# Patient Record
Sex: Female | Born: 1935 | Race: White | Hispanic: No | Marital: Married | Smoking: Never smoker
Health system: Southern US, Community
[De-identification: ages and names within clinical notes are randomized; demographics above are authoritative.]

## PROBLEM LIST (undated history)

## (undated) DIAGNOSIS — I10 Essential (primary) hypertension: Secondary | ICD-10-CM

## (undated) DIAGNOSIS — E039 Hypothyroidism, unspecified: Secondary | ICD-10-CM

## (undated) DIAGNOSIS — F329 Major depressive disorder, single episode, unspecified: Secondary | ICD-10-CM

## (undated) DIAGNOSIS — G4730 Sleep apnea, unspecified: Secondary | ICD-10-CM

## (undated) DIAGNOSIS — M1990 Unspecified osteoarthritis, unspecified site: Secondary | ICD-10-CM

## (undated) DIAGNOSIS — IMO0001 Reserved for inherently not codable concepts without codable children: Secondary | ICD-10-CM

## (undated) DIAGNOSIS — E669 Obesity, unspecified: Secondary | ICD-10-CM

## (undated) DIAGNOSIS — M8580 Other specified disorders of bone density and structure, unspecified site: Secondary | ICD-10-CM

## (undated) DIAGNOSIS — E7800 Pure hypercholesterolemia, unspecified: Secondary | ICD-10-CM

## (undated) DIAGNOSIS — F419 Anxiety disorder, unspecified: Secondary | ICD-10-CM

## (undated) DIAGNOSIS — F32A Depression, unspecified: Secondary | ICD-10-CM

## (undated) HISTORY — DX: Obesity, unspecified: E66.9

## (undated) HISTORY — DX: Essential (primary) hypertension: I10

## (undated) HISTORY — DX: Sleep apnea, unspecified: G47.30

## (undated) HISTORY — PX: ABDOMINAL HYSTERECTOMY: SHX81

## (undated) HISTORY — PX: BILATERAL OOPHORECTOMY: SHX1221

## (undated) HISTORY — DX: Reserved for inherently not codable concepts without codable children: IMO0001

## (undated) HISTORY — DX: Hypothyroidism, unspecified: E03.9

## (undated) HISTORY — PX: VESICOVAGINAL FISTULA CLOSURE W/ TAH: SUR271

## (undated) HISTORY — DX: Unspecified osteoarthritis, unspecified site: M19.90

## (undated) HISTORY — PX: THYROIDECTOMY, PARTIAL: SHX18

## (undated) HISTORY — DX: Pure hypercholesterolemia, unspecified: E78.00

---

## 1998-11-13 ENCOUNTER — Ambulatory Visit: Admission: RE | Admit: 1998-11-13 | Discharge: 1998-11-13 | Payer: Self-pay | Admitting: Internal Medicine

## 1999-02-03 ENCOUNTER — Ambulatory Visit (HOSPITAL_COMMUNITY): Admission: RE | Admit: 1999-02-03 | Discharge: 1999-02-03 | Payer: Self-pay | Admitting: Cardiology

## 1999-02-03 HISTORY — PX: CARDIAC CATHETERIZATION: SHX172

## 1999-03-26 ENCOUNTER — Ambulatory Visit (HOSPITAL_COMMUNITY): Admission: RE | Admit: 1999-03-26 | Discharge: 1999-03-26 | Payer: Self-pay | Admitting: Endocrinology

## 1999-04-10 ENCOUNTER — Ambulatory Visit (HOSPITAL_COMMUNITY): Admission: RE | Admit: 1999-04-10 | Discharge: 1999-04-10 | Payer: Self-pay | Admitting: *Deleted

## 1999-04-13 ENCOUNTER — Ambulatory Visit (HOSPITAL_COMMUNITY): Admission: RE | Admit: 1999-04-13 | Discharge: 1999-04-13 | Payer: Self-pay

## 1999-04-13 ENCOUNTER — Other Ambulatory Visit: Admission: RE | Admit: 1999-04-13 | Discharge: 1999-04-13 | Payer: Self-pay

## 1999-09-10 ENCOUNTER — Other Ambulatory Visit: Admission: RE | Admit: 1999-09-10 | Discharge: 1999-09-10 | Payer: Self-pay | Admitting: Obstetrics and Gynecology

## 2000-03-10 ENCOUNTER — Encounter: Admission: RE | Admit: 2000-03-10 | Discharge: 2000-03-10 | Payer: Self-pay | Admitting: Internal Medicine

## 2000-03-10 ENCOUNTER — Encounter: Payer: Self-pay | Admitting: Internal Medicine

## 2000-03-25 ENCOUNTER — Encounter: Admission: RE | Admit: 2000-03-25 | Discharge: 2000-03-25 | Payer: Self-pay | Admitting: Internal Medicine

## 2000-03-25 ENCOUNTER — Encounter: Payer: Self-pay | Admitting: Internal Medicine

## 2000-03-29 ENCOUNTER — Encounter (INDEPENDENT_AMBULATORY_CARE_PROVIDER_SITE_OTHER): Payer: Self-pay

## 2000-03-29 ENCOUNTER — Ambulatory Visit (HOSPITAL_COMMUNITY): Admission: RE | Admit: 2000-03-29 | Discharge: 2000-03-29 | Payer: Self-pay | Admitting: *Deleted

## 2000-04-04 ENCOUNTER — Encounter: Payer: Self-pay | Admitting: Endocrinology

## 2000-04-04 ENCOUNTER — Ambulatory Visit (HOSPITAL_COMMUNITY): Admission: RE | Admit: 2000-04-04 | Discharge: 2000-04-04 | Payer: Self-pay | Admitting: Endocrinology

## 2000-04-22 ENCOUNTER — Encounter: Admission: RE | Admit: 2000-04-22 | Discharge: 2000-04-22 | Payer: Self-pay | Admitting: *Deleted

## 2000-04-22 ENCOUNTER — Encounter: Payer: Self-pay | Admitting: *Deleted

## 2001-10-31 ENCOUNTER — Other Ambulatory Visit: Admission: RE | Admit: 2001-10-31 | Discharge: 2001-10-31 | Payer: Self-pay | Admitting: Obstetrics and Gynecology

## 2002-11-15 HISTORY — PX: ORIF ANKLE FRACTURE: SUR919

## 2003-07-17 ENCOUNTER — Encounter: Payer: Self-pay | Admitting: Emergency Medicine

## 2003-07-17 ENCOUNTER — Emergency Department (HOSPITAL_COMMUNITY): Admission: EM | Admit: 2003-07-17 | Discharge: 2003-07-17 | Payer: Self-pay | Admitting: Emergency Medicine

## 2003-07-20 ENCOUNTER — Encounter: Payer: Self-pay | Admitting: Orthopedic Surgery

## 2003-07-21 ENCOUNTER — Inpatient Hospital Stay (HOSPITAL_COMMUNITY): Admission: RE | Admit: 2003-07-21 | Discharge: 2003-07-25 | Payer: Self-pay | Admitting: Orthopedic Surgery

## 2003-11-25 ENCOUNTER — Ambulatory Visit (HOSPITAL_COMMUNITY): Admission: RE | Admit: 2003-11-25 | Discharge: 2003-11-25 | Payer: Self-pay | Admitting: Internal Medicine

## 2003-12-06 ENCOUNTER — Encounter (INDEPENDENT_AMBULATORY_CARE_PROVIDER_SITE_OTHER): Payer: Self-pay | Admitting: Specialist

## 2003-12-06 ENCOUNTER — Ambulatory Visit (HOSPITAL_COMMUNITY): Admission: RE | Admit: 2003-12-06 | Discharge: 2003-12-06 | Payer: Self-pay | Admitting: *Deleted

## 2004-08-18 ENCOUNTER — Ambulatory Visit (HOSPITAL_COMMUNITY): Admission: RE | Admit: 2004-08-18 | Discharge: 2004-08-18 | Payer: Self-pay | Admitting: Internal Medicine

## 2004-08-21 HISTORY — PX: US ECHOCARDIOGRAPHY: HXRAD669

## 2004-11-06 ENCOUNTER — Ambulatory Visit (HOSPITAL_COMMUNITY): Admission: RE | Admit: 2004-11-06 | Discharge: 2004-11-06 | Payer: Self-pay | Admitting: Endocrinology

## 2004-12-05 ENCOUNTER — Emergency Department (HOSPITAL_COMMUNITY): Admission: EM | Admit: 2004-12-05 | Discharge: 2004-12-05 | Payer: Self-pay | Admitting: Emergency Medicine

## 2005-01-05 ENCOUNTER — Encounter: Admission: RE | Admit: 2005-01-05 | Discharge: 2005-04-05 | Payer: Self-pay | Admitting: Internal Medicine

## 2005-01-13 ENCOUNTER — Encounter: Admission: RE | Admit: 2005-01-13 | Discharge: 2005-01-13 | Payer: Self-pay | Admitting: Internal Medicine

## 2005-01-25 ENCOUNTER — Encounter (INDEPENDENT_AMBULATORY_CARE_PROVIDER_SITE_OTHER): Payer: Self-pay | Admitting: Specialist

## 2005-01-25 ENCOUNTER — Ambulatory Visit (HOSPITAL_COMMUNITY): Admission: RE | Admit: 2005-01-25 | Discharge: 2005-01-25 | Payer: Self-pay | Admitting: Surgery

## 2005-02-02 ENCOUNTER — Encounter: Admission: RE | Admit: 2005-02-02 | Discharge: 2005-04-13 | Payer: Self-pay | Admitting: Orthopedic Surgery

## 2005-05-21 ENCOUNTER — Encounter: Admission: RE | Admit: 2005-05-21 | Discharge: 2005-05-21 | Payer: Self-pay | Admitting: Internal Medicine

## 2005-06-03 ENCOUNTER — Encounter: Admission: RE | Admit: 2005-06-03 | Discharge: 2005-06-03 | Payer: Self-pay | Admitting: Internal Medicine

## 2005-07-26 ENCOUNTER — Encounter: Admission: RE | Admit: 2005-07-26 | Discharge: 2005-08-25 | Payer: Self-pay | Admitting: Internal Medicine

## 2005-08-26 ENCOUNTER — Encounter: Admission: RE | Admit: 2005-08-26 | Discharge: 2005-11-24 | Payer: Self-pay | Admitting: Internal Medicine

## 2005-10-15 ENCOUNTER — Encounter: Admission: RE | Admit: 2005-10-15 | Discharge: 2005-10-15 | Payer: Self-pay | Admitting: Internal Medicine

## 2005-11-29 ENCOUNTER — Encounter: Admission: RE | Admit: 2005-11-29 | Discharge: 2005-11-29 | Payer: Self-pay | Admitting: General Surgery

## 2005-11-29 ENCOUNTER — Encounter (INDEPENDENT_AMBULATORY_CARE_PROVIDER_SITE_OTHER): Payer: Self-pay | Admitting: *Deleted

## 2005-12-15 ENCOUNTER — Ambulatory Visit (HOSPITAL_COMMUNITY): Admission: RE | Admit: 2005-12-15 | Discharge: 2005-12-15 | Payer: Self-pay | Admitting: *Deleted

## 2005-12-15 ENCOUNTER — Encounter (INDEPENDENT_AMBULATORY_CARE_PROVIDER_SITE_OTHER): Payer: Self-pay | Admitting: Specialist

## 2006-02-15 ENCOUNTER — Encounter: Admission: RE | Admit: 2006-02-15 | Discharge: 2006-02-15 | Payer: Self-pay | Admitting: Internal Medicine

## 2006-07-19 ENCOUNTER — Encounter: Admission: RE | Admit: 2006-07-19 | Discharge: 2006-07-19 | Payer: Self-pay | Admitting: Internal Medicine

## 2006-09-06 ENCOUNTER — Encounter: Admission: RE | Admit: 2006-09-06 | Discharge: 2006-09-06 | Payer: Self-pay | Admitting: Internal Medicine

## 2006-09-08 ENCOUNTER — Encounter: Admission: RE | Admit: 2006-09-08 | Discharge: 2006-09-08 | Payer: Self-pay | Admitting: Internal Medicine

## 2007-02-08 ENCOUNTER — Encounter: Admission: RE | Admit: 2007-02-08 | Discharge: 2007-02-08 | Payer: Self-pay | Admitting: Internal Medicine

## 2008-07-19 ENCOUNTER — Encounter: Admission: RE | Admit: 2008-07-19 | Discharge: 2008-07-19 | Payer: Self-pay | Admitting: Internal Medicine

## 2008-08-21 ENCOUNTER — Ambulatory Visit (HOSPITAL_COMMUNITY): Admission: RE | Admit: 2008-08-21 | Discharge: 2008-08-21 | Payer: Self-pay | Admitting: *Deleted

## 2008-08-21 ENCOUNTER — Encounter (INDEPENDENT_AMBULATORY_CARE_PROVIDER_SITE_OTHER): Payer: Self-pay | Admitting: *Deleted

## 2009-09-04 ENCOUNTER — Encounter: Admission: RE | Admit: 2009-09-04 | Discharge: 2009-09-04 | Payer: Self-pay | Admitting: Internal Medicine

## 2010-11-10 ENCOUNTER — Encounter
Admission: RE | Admit: 2010-11-10 | Discharge: 2010-11-10 | Payer: Self-pay | Source: Home / Self Care | Attending: Endocrinology | Admitting: Endocrinology

## 2010-11-15 DIAGNOSIS — IMO0001 Reserved for inherently not codable concepts without codable children: Secondary | ICD-10-CM

## 2010-11-15 HISTORY — DX: Reserved for inherently not codable concepts without codable children: IMO0001

## 2010-11-23 ENCOUNTER — Ambulatory Visit: Payer: Self-pay | Admitting: Cardiology

## 2010-11-27 ENCOUNTER — Telehealth (INDEPENDENT_AMBULATORY_CARE_PROVIDER_SITE_OTHER): Payer: Self-pay | Admitting: *Deleted

## 2010-11-30 ENCOUNTER — Encounter: Payer: Self-pay | Admitting: Internal Medicine

## 2010-11-30 ENCOUNTER — Ambulatory Visit: Admission: RE | Admit: 2010-11-30 | Discharge: 2010-11-30 | Payer: Self-pay | Source: Home / Self Care

## 2010-11-30 ENCOUNTER — Encounter (HOSPITAL_COMMUNITY)
Admission: RE | Admit: 2010-11-30 | Discharge: 2010-12-15 | Payer: Self-pay | Source: Home / Self Care | Attending: Cardiology | Admitting: Cardiology

## 2010-11-30 HISTORY — PX: CARDIOVASCULAR STRESS TEST: SHX262

## 2010-12-06 ENCOUNTER — Encounter: Payer: Self-pay | Admitting: Surgery

## 2010-12-17 NOTE — Assessment & Plan Note (Addendum)
Summary: Cardiology Nuclear Testing  Nuclear Med Background Indications for Stress Test: Evaluation for Ischemia   History: Myocardial Perfusion Study  History Comments: '08 MPS: EF= 77%  Symptoms: Chest Tightness, DOE, Fatigue, Fatigue with Exertion  Symptoms Comments: Last episode of chest pressure 2days ago   Nuclear Pre-Procedure Cardiac Risk Factors: Family History - CAD, Hypertension, Lipids, Obesity Caffeine/Decaff Intake: None NPO After: 6:30 PM IV 0.9% NS with Angio Cath: 22g     IV Site: R Hand IV Started by: Irean Hong, RN Chest Size (in) 38     Cup Size D     Height (in): 64 Weight (lb): 216 BMI: 37.21  Nuclear Med Study 1 or 2 day study:  1 day     Stress Test Type:  Lexiscan Reading MD:  Arvilla Meres, MD     Referring MD:  Vonna Drafts Resting Radionuclide:  Technetium 84m Tetrofosmin     Resting Radionuclide Dose:  11 mCi  Stress Radionuclide:  Technetium 63m Tetrofosmin     Stress Radionuclide Dose:  33 mCi   Stress Protocol      Max HR:  98 bpm     Predicted Max HR:  148 bpm  Max Systolic BP: 150 mm Hg     Percent Max HR:  66.22 %Rate Pressure Product:  16109  Lexiscan: 0.4 mg   Stress Test Technologist:  Cathlyn Parsons, RN     Nuclear Technologist:  Domenic Polite, CNMT  Rest Procedure  Myocardial perfusion imaging was performed at rest 45 minutes following the intravenous administration of Technetium 68m Tetrofosmin.  Stress Procedure  The patient received IV Lexiscan 0.4 mg over 15-seconds.  Technetium 1m Tetrofosmin injected at 30-seconds.  There were no significant changes with infusion. Patient had chest heaviness 6/10,SOB during infusion and resolved in recovery.  No ectopy noted.   Quantitative spect images were obtained after a 45 minute delay.  QPS Raw Data Images:  Normal; no motion artifact; normal heart/lung ratio. Stress Images:  Normal homogeneous uptake in all areas of the myocardium. Rest Images:  Normal homogeneous  uptake in all areas of the myocardium. Subtraction (SDS):  Normal Transient Ischemic Dilatation:  0.85  (Normal <1.22)  Lung/Heart Ratio:  0.33  (Normal <0.45)  Quantitative Gated Spect Images QGS EDV:  56 ml QGS ESV:  11 ml QGS EF:  81 %  Findings Normal nuclear study      Overall Impression  Exercise Capacity: Lexiscan with no exercise. ECG Impression: No significant ST segment change with Lexiscan. Overall Impression: Normal stress nuclear study.

## 2010-12-17 NOTE — Progress Notes (Signed)
Summary: Nuclear Pre-Procedure  Phone Note Outgoing Call Call back at Home Phone [phone]   Call placed by: Stanton Kidney, EMT-P,  November 27, 2010 3:37 PM Action Taken: Phone Call Completed Summary of Call: Left message with information on Myov[employer] Information Sheet (see scanned document for details). Stanton Kidney, EMT-P  November 27, 2010 3:37 PM     Nuclear Med Background Indications for Stress Test: Evaluation for Ischemia   History: Myocardial Perfusion Study  History Comments: '08 MPS: EF= 77%  Symptoms: Chest Pain, Chest Pressure, Chest Tightness, Fatigue, Fatigue with Exertion    Nuclear Pre-Procedure Cardiac Risk Factors: Family History - CAD, Hypertension, Lipids, Obesity

## 2010-12-18 ENCOUNTER — Other Ambulatory Visit: Payer: Self-pay | Admitting: Gastroenterology

## 2011-03-30 NOTE — Op Note (Signed)
NAMEJENNIFIER, SMITHERMAN NO.:  [id number]   MEDICAL RECORD NO.:  [id number]          PATIENT TYPE:  AMB   LOCATION:  ENDO                         FACILITY:  [facility]   PHYSICIAN:  Georgiana Spinner, M.D.    DATE OF BIRTH:  [birth year]   DATE OF PROCEDURE:  08/21/2008  DATE OF DISCHARGE:                               OPERATIVE REPORT   PROCEDURE:  Colonoscopy.   INDICATIONS:  Abdominal bloating.  Colon polyps.   ANESTHESIA:  Fentanyl 100 mcg, Versed 10 mg.   PROCEDURE IN DETAIL:  With the patient mildly sedated in the left  lateral decubitus position the Pentax videoscopic colonoscope was  inserted in the rectum and passed under direct vision to the cecum  identified by the ileocecal valve and base of cecum both of which were  photographed.  The prep was poor in that there were multiple areas of  yellowish somewhat thickened exudative material with some particles in  it that made it difficult to suction so small lesions could be missed  but from this point the colonoscope was slowly withdrawn taking  circumferential views of colonic mucosa stopping in the descending colon  where polyps were seen, photographed and removed using biopsy forceps  technique.  Subsequently we found a larger polyp in the descending colon  that I removed using snare cautery technique with cautery applied.  Tissue was retrieved by suctioning through the endoscope into a tissue  trap.  We next stopped in the sigmoid colon where a larger polyp was  seen, photographed and removed.  It was on a stalk and this was removed  using snare cautery technique as well.  Both with a setting of 20/150  blended current.  We then withdrew all the way to the rectum which  appeared normal on direct and showed hemorrhoids on retroflexed view.  The endoscope was straightened and withdrawn.  The patient's vital  signs, pulse oximeter remained stable.  The patient tolerated procedure  well without apparent  complications.   FINDINGS:  Internal hemorrhoids, diverticulosis of the sigmoid colon but  diverticulosis seen throughout the colon and polyps were seen in  descending and sigmoid colon, all removed.  Await biopsy report.  The  patient will call me for results and follow up with me as an outpatient.           ______________________________  Georgiana Spinner, M.D.     GMO/MEDQ  D:  08/21/2008  T:  08/21/2008  Job:  [id number]

## 2011-04-02 NOTE — Op Note (Signed)
NAME:  Adrienne [name], Adrienne [name]                        ACCOUNT NO.:  [id number]   MEDICAL RECORD NO.:  [id number]                   PATIENT TYPE:  OBV   LOCATION:  0473                                 FACILITY:  [facility]   PHYSICIAN:  Almedia Balls. Ranell Patrick, M.D.              DATE OF BIRTH:  1935/12/24   DATE OF PROCEDURE:  07/20/2003  DATE OF DISCHARGE:                                 OPERATIVE REPORT   PREOPERATIVE DIAGNOSIS:  Left ankle fracture, distal  fibula.   POSTOPERATIVE DIAGNOSIS:  Left ankle fracture, distal  fibula.   PROCEDURE:  Open reduction and internal fixation of left ankle fracture.   SURGEON:  Almedia Balls. Ranell Patrick, M.D.   FIRST ASSISTANT:  Roma Schanz, P.A.-C.   ANESTHESIA:  General endotracheal anesthesia.   ESTIMATED BLOOD LOSS:  Minimal.   TOURNIQUET TIME:  34 minutes.   COMPLICATIONS:  None. Instrument count was correct.   INDICATIONS FOR PROCEDURE:  The patient is a 75 year old female  who  sustained a twisting injury to the left ankle. She presented with a  displaced lateral malleolus fracture and incongruous medial jointline. The  patient had lateral subluxation of the talus under the  tibia. Based on this  unstable fracture, it was recommended to proceed with surgical reduction and  stabilization of her fractures. Informed consent was obtained.   DESCRIPTION OF PROCEDURE:  After an adequate level of anesthesia was  achieved the patient was positioned supine on the operating table. A  nonsterile tourniquet was placed on the proximal left calf. The left leg was  prepped and draped in the usual sterile fashion. After exsanguination of the  limb using and Esmarch bandage, the tourniquet was elevated to 300 mmHg.   A longitudinal skin incision was created over the distal  fibula. This was  taken sharply down until the fracture site was identified. The soft tissue  was cleared from the fracture site. The fracture was reduced and held in  place with a tenaculum  and clamp. An anterior-posterior lag screw was placed  followed by a posterior-anterior flat plate with 2 proximal fully threaded  cortical screws. No distal screws were placed.   The wound was thoroughly irrigated. The fracture was checked both on AP and  lateral views with C-arm to ensure adequate reduction. Excellent stability  was noted. At this point the wound was closed using a combination of  2-0  Vicryl and staples. A sterile dressing was applied followed  by a short leg  splint. The patient was taken to the recovery room in stable condition.                                               Almedia Balls. Ranell Patrick, M.D.    SRN/MEDQ  D:  07/21/2003  T:  07/21/2003  Job:  [id number]

## 2011-04-02 NOTE — Op Note (Signed)
[name], [name] NO.:  [id number]   MEDICAL RECORD NO.:  [id number]          PATIENT TYPE:  AMB   LOCATION:  ENDO                         [facility]ACILITY:  [facility]   PHYSICIAN:  Georgiana Spinner, M.D.    DATE OF BIRTH:  1936/01/01   DATE OF PROCEDURE:  12/15/2005  DATE OF DISCHARGE:                                 OPERATIVE REPORT   PROCEDURE:  Colonoscopy with polypectomy and biopsy.   INDICATIONS:  Colon polyps.   ANESTHESIA:   DESCRIPTION OF PROCEDURE:  With the patient mildly sedated in the left  lateral decubitus position, the Olympus videoscopic colonoscope was inserted  in the rectum, passed under direct vision through a diverticular filled  sigmoid colon with pressure applied we were able to reach the cecum  identified by ileocecal valve and appendiceal orifice both of which were  photographed. From this point, the colonoscope was slowly withdrawn, taking  circumferential views of the colonic mucosa stopping to photograph and  remove polyps first in the right colon which was removed using snare cautery  technique and a setting of 20/200 blended current.   We next stopped in the descending colon where there were three polyps very  closely spaced; two were removed using hot biopsy forceps technique. One was  removed, and it was somewhat of a larger polyp, and it was removed using  snare cautery technique. Again, with the same setting of 20/200 blended  current. With the latter we had to suction it to the end of the endoscope  and withdraw it. We, therefore, reinserted the endoscope to this level; and  found another polyp, at this point, and it too was removed, at this time  again, using hot biopsy forceps technique with the same setting.  We then  withdrew all the way to the rectum which appeared normal except for two  probably hypoplastic polyps. I elected just to eradicate with the hot biopsy  forceps tip; and in retroflex view hemorrhoids were noted. The  endoscope was  straightened and withdrawn. The patient's vital signs, and pulse oximeter  remained stable. The patient tolerated the procedure well without apparent  complication.   FINDINGS:  1.  Right-sided polyp.  2.  Polyps in the distal transverse and descending colon.  3.  Polyps in the rectum.  4.  Diverticulosis, mostly in the sigmoid colon, moderately severe.   PLAN:  Await biopsy report. The patient will call me for results and  followup with me as an outpatient.           ______________________________  Georgiana Spinner, M.D.     GMO/MEDQ  D:  12/15/2005  T:  12/15/2005  Job:  [id number]

## 2011-04-02 NOTE — Op Note (Signed)
NAME:  Adrienne [name], Adrienne [name]                        ACCOUNT NO.:  [id number]   MEDICAL RECORD NO.:  [id number]                   PATIENT TYPE:  AMB   LOCATION:  ENDO                                 FACILITY:  [facility]   PHYSICIAN:  Georgiana Spinner, M.D.                 DATE OF BIRTH:  [birth year]   DATE OF PROCEDURE:  12/06/2003  DATE OF DISCHARGE:                                 OPERATIVE REPORT   PROCEDURE:  Upper endoscopy with biopsy.   INDICATIONS:  Abdominal discomfort.  Hemoccult positivity.   ANESTHESIA:  1. Demerol 50 mg.  2. Versed 4 mg.   DESCRIPTION OF PROCEDURE:  With patient mildly sedated in the left lateral  decubitus position, the Olympus videoscopic endoscope was inserted in the  mouth, passed under direct vision through the esophagus, which appeared  normal, into the stomach.  Fundus, body, antrum, duodenal bulb, and second  portion of duodenum were visualized.  From this point, the endoscope was  slowly withdrawn, taking circumferential views of duodenal mucosa until the  endoscope pulled back into the stomach, placed in retroflexion to view the  stomach from below.  The endoscope was straightened, withdrawn, taking  circumferential views of the remaining gastric and esophageal mucosa,  stopping in the body and the fundus of the stomach were polyps were seen,  photographed, and biopsied.  The patient's vital signs and pulse oximeter  remained stable.  The patient tolerated the procedure well without apparent  complications.   FINDING:  1. Polyps with body and fundus of stomach, biopsied.  2. Await biopsy report.  3. The patient will call me for results and follow up with me as an     outpatient.  4. Proceed to colonoscopy as planned.                                               Georgiana Spinner, M.D.    GMO/MEDQ  D:  12/06/2003  T:  12/06/2003  Job:  [id number]

## 2011-04-02 NOTE — Discharge Summary (Signed)
NAME:  Adrienne [name], Adrienne [name]                        ACCOUNT NO.:  [id number]   MEDICAL RECORD NO.:  [id number]                   PATIENT TYPE:  INP   LOCATION:  0473                                 FACILITY:  [facility]   PHYSICIAN:  Almedia Balls. Ranell Patrick, M.D.              DATE OF BIRTH:  09/15/1936   DATE OF ADMISSION:  07/20/2003  DATE OF DISCHARGE:  07/24/2003                                 DISCHARGE SUMMARY   ADMITTING DIAGNOSES:  1. Left ankle fracture of distal radius with syndesmosis disruption.  2. Hypothyroidism.  3. Hypertension.   DISCHARGE DIAGNOSES:  1. Left ankle fracture of distal radius with syndesmosis disruption.  2. Hypothyroidism.  3. Hypertension.   PROCEDURE:  On July 20, 2003, the patient underwent open reduction and  internal fixation of left ankle fracture; Roma Schanz, P.A., assisted.   CONSULTS:  None.   DISCUSSION:  This 75 year old lady sustained a twisting injury to her left  ankle.  She had an interesting set of circumstances leading up to present  illness.  She suffered a syncopal episode at work this Wednesday before  admission.  She had a stressful day.  She skipped her lunch in order to  see a dentist.  In the afternoon of the same day, she had a syncopal episode  and fell out of her chair, injuring her left ankle.  She was eventually seen  in the emergency room, where x-rays revealed an oblique fracture of the  distal fibular and disruption of the ankle mortise.  She was seen in our  office and it was decided that due to that this was an unstable fracture,  that surgery would be indicated and she was scheduled for the above  procedure.   COURSE IN THE HOSPITAL:  This patient remained at a high anxiety level, very  fearful in getting about.  She was nonweightbearing to the left lower  extremity.  She had poor upper body strength as well.  She lives at home  with her 54 year old husband and he certainly was unable to tend for her.  After much  discussion including a consult from [facility], it was  felt that the patient needed further care before she went home.  Due to the  probable length of rehabilitation, it was felt that skilled nursing facility  would be indicated and arrangements were made for that transfer.   The patient was placed in a cast/splint to the left lower extremity below  the knee.  We encouraged movement of the toes; sensation remained intact.  Overall, her medical conditions remained stable while in the hospital and we  continued with her home medications.   We decided to place her on 81 mg of aspirin to assist in prevention of  thrombus, as well as TED hose and ankle pumps to the right lower extremity.  On the day of discharge, she was looking forward to her rehabilitation and  skilled nursing.   LABORATORY AND ACCESSORY CLINICAL DATA:  Laboratory values in the hospital  showed a hemoglobin of 15.0; hematocrit was 42.4.  A urinalysis on July 19, 2003 showed moderate leukocyte esterase; when repeated after  perioperative antibiotics, it only showed a trace.  We did not treat the  urinalysis.   Chest x-ray showed heart being enlarged, vascular congestion, mild bibasilar  atelectasis without effusion, done on July 17, 2003.   Electrocardiogram shows normal sinus rhythm on July 17, 2003.   CONDITION ON DISCHARGE:  Improved, stable.   PLAN:  1. Nonweightbearing to the left lower extremity, use walker to maintain     this.  She may rest the left foot when ambulating; this will be less than     touch-down weightbearing, left lower extremity.  2. She is to continue with her home medications which are:     a. Hyzaar 100/25 mg one-half tab daily.     b. Synthroid/Levothroid 150 mcg daily.     c. Aspirin 81 mg daily.     d. Hydrocodone for discomfort.     e. Robaxin 500 mg p.o. q.6h. p.r.n. muscle spasm.     f. Tylox every four hours as needed (q.4 h. p.r.n.) -- severe pain.  3. We  would like to see her back in the office with Dr. Almedia Balls. Norris     approximately two weeks after the date of surgery; two and a half weeks     would be okay as well.  4. She is to keep the left lower extremity elevated on pillows, use ice to     the left ankle on a p.r.n. basis.  5. We would recommend that any medical questions concerning her current     medications or change in her medical status be directed towards Dr.     Soyla Murphy. Renne Crigler, her family physician.       Dooley L. Cherlynn June.                 Almedia Balls. Ranell Patrick, M.D.    DLU/MEDQ  D:  07/24/2003  T:  07/24/2003  Job:  [id number]   cc:   Soyla Murphy. Renne Crigler, M.D.  [address]  [place]  [place] [address]  Fax: [phone]

## 2011-04-02 NOTE — Op Note (Signed)
NAME:  [name], Adrienne [name]                        ACCOUNT NO.:  [id number]   MEDICAL RECORD NO.:  [id number]                   PATIENT TYPE:  AMB   LOCATION:  ENDO                                 FACILITY:  [facility]   PHYSICIAN:  Georgiana Spinner, M.D.                 DATE OF BIRTH:  [birth year]   DATE OF PROCEDURE:  12/06/2003  DATE OF DISCHARGE:                                 OPERATIVE REPORT   PROCEDURE:  Colonoscopy.   INDICATIONS:  Colon polyps.  Hemoccult positivity.   ANESTHESIA:  1. Demerol 25 mg.  2. Versed 6 mg.   DESCRIPTION OF PROCEDURE:  With patient mildly sedated in the left lateral  decubitus position, the Olympus videoscopic colonoscope was inserted in the  rectum, passed through a diverticula-filled sigmoid colon to reach the  cecum, identified by ileocecal valve and appendiceal orifice, both of which  were photographed.  From this point, the colonoscope was slowly withdrawn,  taking circumferential views of the colonic mucosa, stopping first in the  ascending colon where a small polyp in a larger fold which I could define  whether this was truly fold or polyp were photographed and biopsied using  hot biopsy forceps technique, setting of 3/3 blended current.  Tissue was  retrieved for pathology.  The endoscope was then withdrawn, taking  circumferential views of the remaining colonic mucosa, stopping in the  descending colon where another polyp was seen, photographed, and removed,  using snare cautery technique, again a setting of 3/3 blended current.  We  withdrew all the way to the rectum which appeared normal on direct and  showed hemorrhoids on retroflexed view.  The endoscope was straightened,  withdrawn.  The patient's vital signs and pulse oximeter remained stable.  The patient tolerated the procedure well without apparent complications.   FINDINGS:  Diverticulosis throughout the colon, right about the same as left  and moderately severe.  Polyps of ascending  colon and descending colon, as  described, removed.  Internal hemorrhoids.  Await biopsy report.  The patient will call me for results and follow up  with me as an outpatient.                                               Georgiana Spinner, M.D.    GMO/MEDQ  D:  12/06/2003  T:  12/06/2003  Job:  [id number]

## 2011-04-02 NOTE — Procedures (Signed)
[facility]  Patient:    Adrienne [name], VANCLEVE                     MRN: [id number] Adm. Date:  [id number] Attending:  Sabino Gasser                           Procedure Report  PROCEDURE:  Upper endoscopy with biopsy.  ENDOSCOPIST:  Sabino Gasser, M.D.  INDICATION:  Abdominal pain.  ANESTHESIA:  Demerol 90 mg and Versed 9 mg were given intravenously in divided dose.  DESCRIPTION OF PROCEDURE:  With the patient mildly sedated in the left lateral decubitus position, the Olympus videoscopic endoscope was inserted in the mouth and passed under direct vision through the esophagus.  There was a question of Barretts esophagus, photographed and biopsied.  We entered into the stomach and passed the endoscope to the antrum, duodenal bulb and second portion of duodenum, all of which were well-visualized and photographed. There were polyps seen in the duodenal bulb and these were biopsied.  The endoscope was then withdrawn back into the stomach and placed in retroflexion to view the stomach from below and this appeared essentially normal and was photographed.  The endoscope was straightened and pulled back from distal-to-proximal stomach, taking circumferential views of the entire gastric mucosa, stopping to photograph polyps that were seen along the way and biopsying in the stomach and fundus.  Both were under a centimeter in size, certainly.  The endoscope was then withdrawn, taking circumferential views of the remaining gastric and esophageal mucosa which otherwise appeared normal. Patients vital signs and pulse oximetry remained stable.  Patient tolerated the procedure well without apparent complications.  FINDINGS:  Question of Barretts esophagus, biopsied, polyps of gastric body and fundus, biopsied, and biopsy of duodenal bulb polyps.  Await biopsy report.  Patient will call me for results and follow up with me as an outpatient. DD:  03/29/00 TD:  03/30/00 Job:  [id number]

## 2011-04-02 NOTE — H&P (Signed)
NAME:  Adrienne [name], Adrienne [name]                        ACCOUNT NO.:  [id number]   MEDICAL RECORD NO.:  [id number]                   PATIENT TYPE:  AMB   LOCATION:  DAY                                  FACILITY:  [facility]   PHYSICIAN:  Almedia Balls. Ranell Patrick, M.D.              DATE OF BIRTH:  05/23/1936   DATE OF ADMISSION:  07/19/2003  DATE OF DISCHARGE:                                HISTORY & PHYSICAL   CHIEF COMPLAINT:  Pain in my left ankle.   HISTORY OF PRESENT ILLNESS:  A 75 year old white female who suffered a  syncopal episode at work this past Wednesday.  Apparently, she had a  stressful day.  She skipped lunch in order to see a dentist.  In the  afternoon while she was at her chair at work she suffered this syncopal  episode and apparently fell out of her chair injuring her left ankle.  She  was taken to [facility] Emergency Room where x-rays revealed an oblique  fracture of the distal fibula with disruption of the ankle mortis.  She  followed up today with Ronnell Guadalajara, M.D.  Philips Montez Morita, M.D. has  discussed this case with Almedia Balls. Norris, M.D. and we planned open  reduction internal fixation Saturday morning.   PAST MEDICAL HISTORY:  1. This lady has been in relatively good health.  Is currently under the     care of Soyla Murphy. Renne Crigler, M.D.  2. Thyroidectomy.  3. Hysterectomy.  4. Bunionectomy.  5. Hypertension.  6. Hypothyroidism.   ALLERGIES:  SULFA and IBUPROFEN which causes rash and swelling of the face.   CURRENT MEDICATIONS:  1. Hyzaar 100/25 one-half tablet daily.  2. Synthroid either 150 mcg or 115 mcg one daily.  This will be confirmed     with the family.   FAMILY HISTORY:  Essentially negative.   SOCIAL HISTORY:  The patient has no intake of tobacco or alcohol products.  Is married.  Has a daughter as a support person.   REVIEW OF SYSTEMS:  CNS:  No seizures, shoulder paralysis, numbness, double  vision.  RESPIRATORY:  No productive cough.  No hemoptysis.   No shortness of  breath.  CARDIOVASCULAR:  No chest pain.  No angina.  No orthopnea.  GASTROINTESTINAL:  No nausea, vomiting, melena, or bloody stool.  GENITOURINARY:  No discharge, dysuria, hematuria.  MUSCULOSKELETAL:  Primarily in present illness left ankle.   PHYSICAL EXAMINATION:  GENERAL:  Alert, cooperative, friendly 75 year old  white female seen seated in wheelchair.  She is accompanied by her husband  and her daughter.  VITAL SIGNS:  Blood pressure 110/70, pulse 80 and regular, respirations 12,  unlabored.  HEENT:  Normocephalic.  Wears glasses.  PERRLA.  EOM intact.  Oropharynx is  clear.  CHEST:  Clear to auscultation.  No rhonchi.  No rales.  HEART:  Regular rate and rhythm.  No murmurs heard.  ABDOMEN:  Obese,  soft, nontender.  Liver, spleen not felt.  GENITALIA:  Not done.  Not pertinent to present illness.  RECTAL:  Not done.  Not pertinent to present illness.  PELVIC:  Not done.  Not pertinent to present illness.  BREASTS:  Not done.  Not pertinent to present illness.  EXTREMITIES:  Left lower extremity is seen in a cast/splint.  She has  excellent sensation to the toes and can move the toes.   ADMITTING DIAGNOSES:  1. Oblique fracture to the distal right fibula with disruption of the ankle     mortis.  2. Hypertension.  3. Hypothyroidism.   PLAN:  The patient will undergo open reduction internal fixation to the left  ankle.  Should we have any medical problems we will certainly ask Soyla Murphy.  Renne Crigler, M.D. to provide his expertise in handling any medical problems that  may arise during this patient's hospitalization.     Dooley L. Cherlynn June.                 Almedia Balls. Ranell Patrick, M.D.    DLU/MEDQ  D:  07/19/2003  T:  07/19/2003  Job:  [id number]   cc:   Soyla Murphy. Renne Crigler, M.D.  [address]  [place]  [place] [address]  Fax: [phone]

## 2011-04-16 ENCOUNTER — Institutional Professional Consult (permissible substitution): Payer: Self-pay | Admitting: Pulmonary Disease

## 2011-04-26 ENCOUNTER — Encounter: Payer: Self-pay | Admitting: Pulmonary Disease

## 2011-04-29 ENCOUNTER — Ambulatory Visit (INDEPENDENT_AMBULATORY_CARE_PROVIDER_SITE_OTHER): Payer: Medicare Other | Admitting: Pulmonary Disease

## 2011-04-29 ENCOUNTER — Encounter: Payer: Self-pay | Admitting: Pulmonary Disease

## 2011-04-29 VITALS — BP 112/68 | HR 69 | Temp 98.0°F | Ht 64.0 in | Wt 215.0 lb

## 2011-04-29 DIAGNOSIS — G4733 Obstructive sleep apnea (adult) (pediatric): Secondary | ICD-10-CM

## 2011-04-29 NOTE — Patient Instructions (Signed)
Look up CPAP masks and oral appliance to treat sleep apnea on your computer>>call once you make a decision Follow up in 3 to 4 months

## 2011-04-29 NOTE — Progress Notes (Signed)
Subjective:    Patient ID: Adrienne [name], female    DOB: [birth year], 75 y.o.   MRN: [id number]  HPI 75 yo female for sleep evaluation.  She had a sleep study about 3 years ago.  From her description of the test this was likely a split night study.  She was told that she had sleep apnea.  She was started on CPAP with nasal pillows.  Her mask was uncomfortable.  As a result she stopped using CPAP.  She was not tried on any other type of mask.  She goes to bed at 1030 pm.  She has been using [place] for about one year.  This can help her fall asleep, but she still wakes up frequently during the night.  She will use the bathroom several times per night.  She twist/turns a lot during the night.  She gets out of bed at 7 am.  She feels tired in the morning, but denies headaches.  She will occasionally nap during the day, but this does not help much.  She falls asleep easily when sitting quiet.  She drinks a cup of coffee in the morning, and a diet coke in the evening.  She has been very anxious due to her husbands health concerns.  She does snore.  The patient denies sleep walking, sleep talking, bruxism, or nightmares.  There is no history of restless legs.  The patient denies sleep hallucinations, sleep paralysis, or cataplexy.  She has gained about 15 lbs recently.  Past Medical History  Diagnosis Date  . Hypertension   . Hypercholesterolemia   . Sleep apnea      Family History  Problem Relation Age of Onset  . Heart disease Father   . Colon cancer Mother      History   Social History  . Marital Status: Married    Spouse Name: N/A    Number of Children: N/A  . Years of Education: N/A   Occupational History  . clerical     retired   Social History Main Topics  . Smoking status: Never Smoker   . Smokeless tobacco: Not on file  . Alcohol Use: No  . Drug Use: No  . Sexually Active: Not on file   Other Topics Concern  . Not on file   Social History Narrative  . No narrative  on file     Allergies  Allergen Reactions  . Sulfonamide Derivatives     REACTION: Reaction not known     No outpatient prescriptions prior to visit.    Review of Systems  Respiratory: Positive for shortness of breath.   Psychiatric/Behavioral:       Anxiety       Objective:   Physical Exam  BP 112/68  Pulse 69  Temp(Src) 98 F (36.7 C) (Oral)  Ht 5\' 4"  (1.626 m)  Wt 215 lb (97.523 kg)  BMI 36.90 kg/m2  SpO2 96%  General - obese HEENT - PERRLA, EOMI, no sinus tenderness, MP 3, no oral exudate, no LAN, no thyromegaly Cardiac - s1s2 regular, no murmur, pulses symmetric Chest - no wheeze/rales/dullness Abd - soft, non-tender, normal bowel sounds Ext - no e/c/c Neuro - normal strength, CN intact, A&O x 3 Psych - normal mood/behavior     Assessment & Plan:   OSA (obstructive sleep apnea) She has prior history of sleep apnea.  She has gained weight since her original study.  She has a history of hypertension and mood disorder.  She has  continued sleep disruption and daytime sleepiness.  She likely still has sleep apnea, and this is likely severe given that she met split night study criteria during her previous sleep study.  I explained how sleep apnea can affect the patient's health.  Driving precautions and importance of weight loss were discussed.  Treatment options for sleep apnea were reviewed.  I have recommended that she consider retrying CPAP.  She was not able to make a decision at this time.  I advised her to review information for CPAP, oral appliance, and surgical procedures on-line with her family.  Once she makes a decision she is to call my office.  Will continue with ambien for now.  Once she is on therapy for her sleep apnea, will then see if this can be gradually weaned off.    Updated Medication List Outpatient Encounter Prescriptions as of 04/29/2011  Medication Sig Dispense Refill  . aspirin 81 MG tablet Take 81 mg by mouth daily.        .  Calcium Carbonate (CALTRATE 600 PO) Take 1 tablet by mouth daily.        . Cholecalciferol (VITAMIN D3) 1000 UNITS CAPS Take 2 capsules by mouth daily.        Marland Kitchen darifenacin (ENABLEX) 7.5 MG 24 hr tablet Take 7.5 mg by mouth daily.        . fenofibrate 160 MG tablet Take 160 mg by mouth daily.        . fish oil-omega-3 fatty acids 1000 MG capsule Take 2 g by mouth daily.        Marland Kitchen losartan-hydrochlorothiazide (HYZAAR) 100-12.5 MG per tablet Take 1 tablet by mouth Daily.      . Multiple Vitamins-Minerals (CENTRUM SILVER PO) Take 1 tablet by mouth daily.        . rosuvastatin (CRESTOR) 20 MG tablet Take 20 mg by mouth daily.        . sertraline (ZOLOFT) 50 MG tablet Take 1 tablet by mouth Daily.      Marland Kitchen zolpidem (AMBIEN) 5 MG tablet Take 1 tablet by mouth At bedtime. As needed

## 2011-05-03 ENCOUNTER — Encounter: Payer: Self-pay | Admitting: Pulmonary Disease

## 2011-05-03 ENCOUNTER — Telehealth: Payer: Self-pay | Admitting: Pulmonary Disease

## 2011-05-03 DIAGNOSIS — G4733 Obstructive sleep apnea (adult) (pediatric): Secondary | ICD-10-CM

## 2011-05-03 NOTE — Telephone Encounter (Signed)
[email or web address]

## 2011-05-03 NOTE — Assessment & Plan Note (Signed)
She has prior history of sleep apnea.  She has gained weight since her original study.  She has a history of hypertension and mood disorder.  She has continued sleep disruption and daytime sleepiness.  She likely still has sleep apnea, and this is likely severe given that she met split night study criteria during her previous sleep study.  I explained how sleep apnea can affect the patient's health.  Driving precautions and importance of weight loss were discussed.  Treatment options for sleep apnea were reviewed.  I have recommended that she consider retrying CPAP.  She was not able to make a decision at this time.  I advised her to review information for CPAP, oral appliance, and surgical procedures on-line with her family.  Once she makes a decision she is to call my office.  Will continue with ambien for now.  Once she is on therapy for her sleep apnea, will then see if this can be gradually weaned off.

## 2011-05-03 NOTE — Telephone Encounter (Signed)
Iw ill [facility] CPAP range 5 to 15 cm H2O with heated humidity and mask of choice.  Have download sent after two weeks use.  Please ensure that we get copy of previous sleep test from [facility] done approximately 3 yrs ago.  Can call Dr. Carolee Rota office to determine if he has copy of study.  Please notify pt of plan.

## 2011-05-03 NOTE — Telephone Encounter (Signed)
Pt states she has decided to try the cpap machine. She request order be sent to [facility]. Carron Curie, CMA

## 2011-05-04 ENCOUNTER — Telehealth: Payer: Self-pay | Admitting: *Deleted

## 2011-05-04 NOTE — Telephone Encounter (Signed)
Patient calling back.   °

## 2011-05-04 NOTE — Telephone Encounter (Signed)
ERROR

## 2011-05-04 NOTE — Telephone Encounter (Signed)
[email or web address]

## 2011-05-04 NOTE — Telephone Encounter (Signed)
Spoke with pt and notified of results/recs per VS.  Pt verbalized understanding. I called [facility] for her PSG results and per Duwayne Heck, they will be faxed to triage faxline.

## 2011-05-05 ENCOUNTER — Telehealth: Payer: Self-pay | Admitting: Pulmonary Disease

## 2011-05-05 NOTE — Telephone Encounter (Signed)
[id number]

## 2011-05-06 NOTE — Telephone Encounter (Signed)
Spoke with pt and she states she just wanted to let us know that [facility] will be contacting us to get more information about the cpap she needs. I advised we will wait on [facility] to contact us. Carron Curie, CMA

## 2011-05-06 NOTE — Telephone Encounter (Signed)
Pt returned call. Adrienne [name]  

## 2011-05-11 ENCOUNTER — Telehealth: Payer: Self-pay | Admitting: Pulmonary Disease

## 2011-05-11 DIAGNOSIS — G4733 Obstructive sleep apnea (adult) (pediatric): Secondary | ICD-10-CM

## 2011-05-11 NOTE — Telephone Encounter (Signed)
Olsd sleep study from 2007 received from [facility]. Placed in VS look at. Pls advise.

## 2011-05-11 NOTE — Telephone Encounter (Signed)
Pt has decided she would like to start back on CPAP and believes she needs another sleep study done. I have requested that Dr. Carolee Rota office fax over the last sleep test done so Dr. Craige Cotta may review. Will hold msg until study has been received.

## 2011-05-12 NOTE — Telephone Encounter (Signed)
Please inform patient that she will need CPAP titration study in the sleep lab.  Will arrange for this, and call her with results once completed.  Advise her that it can take up to one week after sleep test is done to get results.  I have sent order to [facility] to arrange for CPAP titration study.

## 2011-05-12 NOTE — Telephone Encounter (Signed)
ATC # provided x 3 - line busy.  WCB. 

## 2011-05-12 NOTE — Telephone Encounter (Signed)
[email or web address]  

## 2011-05-13 ENCOUNTER — Encounter: Payer: Self-pay | Admitting: Pulmonary Disease

## 2011-05-13 ENCOUNTER — Encounter (HOSPITAL_BASED_OUTPATIENT_CLINIC_OR_DEPARTMENT_OTHER): Payer: Medicare Other

## 2011-05-13 NOTE — Telephone Encounter (Signed)
Returning call.  [phone]

## 2011-05-13 NOTE — Telephone Encounter (Signed)
Pt aware. She states this has been scheduled for July 18th.Carron Curie, CMA

## 2011-06-02 ENCOUNTER — Ambulatory Visit (HOSPITAL_BASED_OUTPATIENT_CLINIC_OR_DEPARTMENT_OTHER): Payer: Medicare Other | Attending: Pulmonary Disease

## 2011-06-02 DIAGNOSIS — G4733 Obstructive sleep apnea (adult) (pediatric): Secondary | ICD-10-CM

## 2011-06-02 DIAGNOSIS — I10 Essential (primary) hypertension: Secondary | ICD-10-CM | POA: Insufficient documentation

## 2011-06-02 DIAGNOSIS — Z7982 Long term (current) use of aspirin: Secondary | ICD-10-CM | POA: Insufficient documentation

## 2011-06-02 DIAGNOSIS — Z79899 Other long term (current) drug therapy: Secondary | ICD-10-CM | POA: Insufficient documentation

## 2011-06-07 NOTE — Procedures (Addendum)
NAMEHOLLYE, [name] NO.:  [id number]  MEDICAL RECORD NO.:  [id number]          PATIENT TYPE:  OUT  LOCATION:  SLEEP CENTER                 FACILITY:  [facility]  PHYSICIAN:  Coralyn Helling, MD        DATE OF BIRTH:  [birth year]  DATE OF STUDY:  06/02/2011                           NOCTURNAL POLYSOMNOGRAM  REFERRING PHYSICIAN:  Coralyn Helling, MD  INDICATION FOR STUDY:  Ms. Territo is a 75 year old female who has a history of hypertension.  She has also a diagnosis of obstructive sleep apnea.  She had an original sleep study done on Mar 16, 2006, and found to have severe obstructive sleep apnea with a resting disturbance index of 35.2 and oxygen saturation nadir of 66%.  She then had a CPAP titration study done on Mar 24, 2006, and was titrated to CPAP 7 centimeter of water.  She was having difficulty with the use of CPAP and is therefore referred to the sleep lab for repeat CPAP titration study.  Height is 63 inches, weight is 215 pounds.  BMI is 38.  Neck size 16.5 inches.  EPWORTH SLEEPINESS SCORE:  Eight.  MEDICATIONS: 1. Losartan. 2. Aspirin. 3. Crestor. 4. Fenofibrate. 5. Enablex.  SLEEP ARCHITECTURE:  Total recording time was 366 minutes.  Total sleep time was 263 minutes.  Sleep efficiency was 71%.  Sleep latency was 16 minutes.  REM latency was 23 minutes.  The study was notable for the lack of stage III sleep.  The patient slept in both the supine and non- supine positions.  RESPIRATORY DATA:  The average respiratory was 17.  The patient was started on CPAP at 4 centimeter of water and increased to 10 centimeter of water.  With CPAP at 8 centimeter of water.  The apnea-hypopnea index was reduced to 0.  At this pressure setting, the patient was observed in REM supine sleep.  OXYGEN DATA:  The baseline oxygenation was 96%.  The oxygen saturation nadir was 91%.  The study was conducted without the use of supplemental oxygen.  CARDIAC DATA:  The  average heart rate is 58 and the rhythm strip showed sinus rhythm with no parasomnia.  The periodic limb movement index was 0 and the patient had one restroom trip.  MOVEMENT-PARASOMNIA:  IMPRESSIONS-RECOMMENDATIONS:  This was a successful CPAP titration study.  She had good control of her sleep disordered breathing with CPAP at 8 centimeter of water.  In addition to diet, excise, and weight reduction, I would recommend that the patient be started on CPAP at 8 centimeter of water and monitored for her clinical response.     Coralyn Helling, MD Diplomat, American Board of Sleep Medicine Electronically Signed    VS/MEDQ  D:  06/06/2011 15:03:13  T:  06/07/2011 04:16:06  Job:  [id number]

## 2011-06-08 ENCOUNTER — Telehealth: Payer: Self-pay | Admitting: Pulmonary Disease

## 2011-06-08 DIAGNOSIS — G4733 Obstructive sleep apnea (adult) (pediatric): Secondary | ICD-10-CM

## 2011-06-08 NOTE — Telephone Encounter (Signed)
CPAP 8 cm H2O>>AHI 0.  Slept in REM and supine.  Left message detailing results, and plan to proceed with set up.  Pt is to call back if she has questions.  Otherwise, will have my nurse schedule ROV 2 months after CPAP set up.

## 2011-06-09 NOTE — Telephone Encounter (Signed)
Dr. Craige Cotta, please advise.  Pt is calling back with questions regarding the message you left her for her sleep study results.   Thanks.

## 2011-06-09 NOTE — Telephone Encounter (Signed)
Pt has questions about results that Dr. Craige Cotta left on answer machine.

## 2011-06-09 NOTE — Telephone Encounter (Signed)
Pt wanted to request that [facility] her [facility].  Will relay this information to [facility].

## 2011-06-10 NOTE — Telephone Encounter (Signed)
Orders were given to [facility] 06/08/11 for cpap pt will hear from them soon

## 2011-06-17 DIAGNOSIS — G4733 Obstructive sleep apnea (adult) (pediatric): Secondary | ICD-10-CM

## 2011-06-17 DIAGNOSIS — I10 Essential (primary) hypertension: Secondary | ICD-10-CM

## 2011-06-30 ENCOUNTER — Other Ambulatory Visit: Payer: Self-pay | Admitting: Pulmonary Disease

## 2011-06-30 DIAGNOSIS — G4733 Obstructive sleep apnea (adult) (pediatric): Secondary | ICD-10-CM

## 2011-07-20 ENCOUNTER — Ambulatory Visit (INDEPENDENT_AMBULATORY_CARE_PROVIDER_SITE_OTHER): Payer: Medicare Other | Admitting: Pulmonary Disease

## 2011-07-20 DIAGNOSIS — G4733 Obstructive sleep apnea (adult) (pediatric): Secondary | ICD-10-CM

## 2011-07-20 NOTE — Progress Notes (Signed)
[facility] 07/12/11:  Test time 5hrs  AHI 5, RDI 7.  All obstructive events.  Average respiratory rate 14.  ODI 4.  Average SpO2 94%, low SpO2 77%.  Spent 5 min with SpO2 < 88%.  Average heart rate 61 (range 53 to 214).

## 2011-07-20 NOTE — Assessment & Plan Note (Signed)
Study consistent with mild sleep apnea.

## 2011-08-12 ENCOUNTER — Other Ambulatory Visit: Payer: Self-pay | Admitting: Gastroenterology

## 2011-08-26 ENCOUNTER — Telehealth: Payer: Self-pay | Admitting: Pulmonary Disease

## 2011-08-26 NOTE — Telephone Encounter (Signed)
Spoke with Cortney with [employer]. She states that the pt has called her every day since she started on CPAP stating she feels like the pressure is set too low, not getting enough air. Cortney states going to pt's home this afternoon to make sure machine functioning properly. She wants to know if VS thinks pressure should be adjusted. Currently set on 5-16. Please advise, thanks!

## 2011-08-30 NOTE — Telephone Encounter (Signed)
Please have [facility] send her CPAP download.

## 2011-08-30 NOTE — Telephone Encounter (Signed)
Accidentally closed the phone note

## 2011-08-30 NOTE — Telephone Encounter (Signed)
I spoke with Adrienne [name] and they states they will request his card to get his download and once they do will fax over the report. Will hold in my box until report is received.

## 2011-09-10 NOTE — Telephone Encounter (Signed)
I spoke with [facility] and they state they have been leaving messages for pt call them so they can get her card to download it and send to VS

## 2011-09-10 NOTE — Telephone Encounter (Signed)
I spoke with pt and she states Adrienne [name] is coming on Monday to get the download off her machine and fax it to VS. Will await fax

## 2011-09-17 NOTE — Telephone Encounter (Signed)
That is fine 

## 2011-09-17 NOTE — Telephone Encounter (Signed)
I spoke with Toni Amend from [place] and she states that when they picked up patients machine to get the download their was no data on their. She states they had to switch out her machine on Wednesday. They state once patent has been on this machine x 2 weeks they will send VS a download. Will forward to Dr. Craige Cotta so he is aware. Please advise Dr. Craige Cotta, thanks  Carver Fila, CMA

## 2011-10-12 ENCOUNTER — Encounter: Payer: Self-pay | Admitting: Pulmonary Disease

## 2011-10-12 ENCOUNTER — Telehealth: Payer: Self-pay | Admitting: Pulmonary Disease

## 2011-10-12 NOTE — Telephone Encounter (Signed)
Auto CPAP 09/15/11 to 09/30/11>>Used on 8 of 16 nights with average 2 hrs 34 min.  Average AHI 0.4 with median pressure 7 cm H2O and 95th percentile pressure 13 cm H2O.  Will have my nurse inform patient that CPAP report shows good control of sleep apnea, but she needs to use CPAP more to get maximal benefit.  She will also need ROV in January 2013.

## 2011-10-12 NOTE — Telephone Encounter (Signed)
I spoke with patient about results and she verbalized understanding and had no questions. Since January schedule is not open so will put reminder in computer for apt.

## 2011-11-30 ENCOUNTER — Encounter: Payer: Self-pay | Admitting: Pulmonary Disease

## 2012-02-03 ENCOUNTER — Other Ambulatory Visit: Payer: Self-pay | Admitting: Orthopedic Surgery

## 2012-02-07 ENCOUNTER — Encounter: Payer: Self-pay | Admitting: *Deleted

## 2012-02-08 ENCOUNTER — Ambulatory Visit: Payer: Medicare Other | Admitting: Pulmonary Disease

## 2012-02-08 ENCOUNTER — Encounter: Payer: Self-pay | Admitting: Nurse Practitioner

## 2012-02-08 ENCOUNTER — Ambulatory Visit (INDEPENDENT_AMBULATORY_CARE_PROVIDER_SITE_OTHER): Payer: Medicare Other | Admitting: Nurse Practitioner

## 2012-02-08 VITALS — BP 122/70 | HR 88 | Ht 63.0 in | Wt 213.0 lb

## 2012-02-08 DIAGNOSIS — I10 Essential (primary) hypertension: Secondary | ICD-10-CM

## 2012-02-08 DIAGNOSIS — Z01818 Encounter for other preprocedural examination: Secondary | ICD-10-CM | POA: Insufficient documentation

## 2012-02-08 DIAGNOSIS — E669 Obesity, unspecified: Secondary | ICD-10-CM

## 2012-02-08 NOTE — Patient Instructions (Signed)
I will talk with Dr. Swaziland about updating your stress test.   We will see you back as needed.  Call the [facility] office at [phone] if you have any questions, problems or concerns.

## 2012-02-08 NOTE — Assessment & Plan Note (Addendum)
Patient presents for surgical clearance for TKR this coming June. She has no known CAD. Last Myoview was January of 2012 and was normal. She has no known valvular heart disease as well. She is currently without symptoms. Seems to be an acceptable candidate for surgery. Will review with Dr. Swaziland to see if he would like to have her Myoview repeated. Patient is agreeable to this plan and will call if any problems develop in the interim.   I have reviewed her case with Dr. Swaziland. She is cleared for surgery with no further testing indicated per Dr. Swaziland.

## 2012-02-08 NOTE — Progress Notes (Signed)
Adrienne [name] Date of Birth: 07-17-1936 Medical Record #[id number]  History of Present Illness: Adrienne [name] is seen today for a surgical clearance visit. She is seen for Dr. Swaziland. She is a 76 year old female with no known history of CAD. She had a normal stress test in January of 2012. Her other problems include HTN, obesity, DM and HLD.  She comes in today. She is here with her daughter. She is feeling ok from our standpoint. She is not having any chest pain/discomfort. She does get a little short of breath with her activities but notes that she is "just not active" due to her knees. She is planning on TKR in June with Dr. Lovey Newcomer. She is not lightheaded or dizzy. No known valvular heart disease. Her primary care doctor is Dr. Renne Crigler.   Current Outpatient Prescriptions on File Prior to Visit  Medication Sig Dispense Refill  . aspirin 81 MG tablet Take 81 mg by mouth daily.        . Calcium Carbonate (CALTRATE 600 PO) Take 1 tablet by mouth daily.        . Cholecalciferol (VITAMIN D3) 1000 UNITS CAPS Take 2 capsules by mouth daily.        . fenofibrate 160 MG tablet Take 160 mg by mouth daily.        . fish oil-omega-3 fatty acids 1000 MG capsule Take 1 g by mouth daily.       Marland Kitchen losartan-hydrochlorothiazide (HYZAAR) 100-12.5 MG per tablet Take 0.5 tablets by mouth Daily.       . Multiple Vitamins-Minerals (CENTRUM SILVER PO) Take 1 tablet by mouth daily.        . rosuvastatin (CRESTOR) 20 MG tablet Take 20 mg by mouth daily.        . sertraline (ZOLOFT) 50 MG tablet Take 1 tablet by mouth Daily.        Allergies  Allergen Reactions  . Sulfonamide Derivatives     REACTION: Reaction not known    Past Medical History  Diagnosis Date  . Hypertension   . Hypercholesterolemia   . Sleep apnea   . Hypothyroidism   . Obesity   . Osteoarthritis (arthritis due to wear and tear of joints)   . Normal nuclear stress test Jan 2012    No ischemia. EF 81%    Past Surgical History    Procedure Date  . Vesicovaginal fistula closure w/ tah   . Thyroidectomy, partial   . Cardiac catheterization 02/03/1999    EF 70%  . US echocardiography 08/21/2004    EF 60-65%  . Cardiovascular stress test 11/30/2010    EF 81%, NORMAL    History  Smoking status  . Never Smoker   Smokeless tobacco  . Not on file    History  Alcohol Use No    Family History  Problem Relation Age of Onset  . Heart disease Father   . Hypertension Father   . Heart attack Father   . Colon cancer Mother   . Heart attack Mother   . Hypertension Mother   . Breast cancer Sister     Review of Systems: The review of systems is positive for pain in her knees. She is not having any chest pain/discomfort. She is not short of breath at rest. No dizzy spells or syncope reported.  All other systems were reviewed and are negative.  Physical Exam: BP 122/70  Pulse 88  Ht 5\' 3"  (1.6 m)  Wt 213 lb (  96.616 kg)  BMI 37.73 kg/m2 Patient is pleasant and in no acute distress. Affect is a little flat. She is obese. Skin is warm and dry. Color is normal.  HEENT is unremarkable. Normocephalic/atraumatic. PERRL. Sclera are nonicteric. Neck is supple. No masses. No JVD. Lungs are clear. Cardiac exam shows a regular rate and rhythm. Abdomen is obese but soft. Extremities are without edema. Gait is unsteady. ROM is intact. No gross neurologic deficits noted.  LABORATORY DATA: EKG today shows sinus rhythm with nonspecific changes.   Assessment / Plan:

## 2012-02-17 ENCOUNTER — Telehealth: Payer: Self-pay | Admitting: Cardiology

## 2012-02-17 NOTE — Telephone Encounter (Signed)
New Problem:     PAtient called in because Dr. Renne Crigler needs Dr. Swaziland to fax a surgical clearance note to Dr. Ollen Gross at [facility].  Please call back.

## 2012-02-17 NOTE — Telephone Encounter (Signed)
Patient called, stating she needs surgical clearance from Dr.Jordan to have rt knee surgery 04/21/12 with Dr. Lequita Halt.Patient was told Dr.Jordan not in office today, will check with him tomorrow 02/18/12 and call her back.

## 2012-02-18 NOTE — Telephone Encounter (Signed)
Patient called and told ok with Dr.Jordan to have knee surgery.Office dictation stating cleared for surgery was faxed to Dr.Aluisio.

## 2012-02-18 NOTE — Telephone Encounter (Signed)
Patient called and told.

## 2012-04-14 ENCOUNTER — Encounter (HOSPITAL_COMMUNITY): Payer: Self-pay | Admitting: Pharmacy Technician

## 2012-04-18 ENCOUNTER — Ambulatory Visit (HOSPITAL_COMMUNITY)
Admission: RE | Admit: 2012-04-18 | Discharge: 2012-04-18 | Disposition: A | Discharge status: Home / Self Care | Payer: Medicare Other | Source: Ambulatory Visit | Attending: Orthopedic Surgery | Admitting: Orthopedic Surgery

## 2012-04-18 ENCOUNTER — Encounter (HOSPITAL_COMMUNITY)
Admission: RE | Admit: 2012-04-18 | Discharge: 2012-04-18 | Disposition: A | Discharge status: Home / Self Care | Payer: Medicare Other | Source: Ambulatory Visit | Attending: Orthopedic Surgery | Admitting: Orthopedic Surgery

## 2012-04-18 ENCOUNTER — Encounter (HOSPITAL_COMMUNITY): Payer: Self-pay

## 2012-04-18 DIAGNOSIS — Z01812 Encounter for preprocedural laboratory examination: Secondary | ICD-10-CM | POA: Insufficient documentation

## 2012-04-18 DIAGNOSIS — Z01818 Encounter for other preprocedural examination: Secondary | ICD-10-CM | POA: Insufficient documentation

## 2012-04-18 HISTORY — DX: Major depressive disorder, single episode, unspecified: F32.9

## 2012-04-18 HISTORY — DX: Depression, unspecified: F32.A

## 2012-04-18 HISTORY — DX: Other specified disorders of bone density and structure, unspecified site: M85.80

## 2012-04-18 HISTORY — DX: Anxiety disorder, unspecified: F41.9

## 2012-04-18 LAB — COMPREHENSIVE METABOLIC PANEL
BUN: 20 mg/dL (ref 6–23)
Chloride: 104 mEq/L (ref 96–112)
GFR calc Af Amer: >90 mL/min (ref >90)
GFR calc non Af Amer: 84 mL/min — ABNORMAL LOW (ref >90)
Potassium: 3.5 mEq/L (ref 3.5–5.1)
Sodium: 138 mEq/L (ref 135–145)
Total Protein: 6.9 g/dL (ref 6.0–8.3)

## 2012-04-18 LAB — URINE MICROSCOPIC-ADD ON

## 2012-04-18 LAB — URINALYSIS, ROUTINE W REFLEX MICROSCOPIC
Bilirubin Urine: NEGATIVE
Ketones, ur: NEGATIVE mg/dL
Nitrite: NEGATIVE
Protein, ur: NEGATIVE mg/dL
Urobilinogen, UA: 0.2 mg/dL (ref 0.0–1.0)

## 2012-04-18 LAB — CBC
HCT: 39.8 % (ref 36.0–46.0)
Platelets: 274 10*3/uL (ref 150–400)
RDW: 13.2 % (ref 11.5–15.5)
WBC: 8.4 10*3/uL (ref 4.0–10.5)

## 2012-04-18 LAB — PROTIME-INR
INR: 1.04 (ref 0.00–1.49)
Prothrombin Time: 13.8 seconds (ref 11.6–15.2)

## 2012-04-18 LAB — APTT: aPTT: 32 seconds (ref 24–37)

## 2012-04-18 NOTE — Patient Instructions (Signed)
YOUR SURGERY IS SCHEDULED ON:  Friday  6/7  AT 1:20 PM  REPORT TO [facility] AT:  10:45 AM      PHONE # FOR SHORT STAY IS [phone]  DO NOT EAT  ANYTHING AFTER MIDNIGHT THE NIGHT BEFORE YOUR SURGERY.  NO FOOD, NO CHEWING GUM, NO MINTS, NO CANDIES, NO CHEWING TOBACCO.  YOU MAY BRUSH YOUR TEETH.  YOU MAY HAVE CLEAR LIQUIDS TO DRINK FROM MIDNIGHT UNTIL 7:00 AM THE DAY OF YOUR SURGERY. WATER,  SODA, APPLE JUICE.    NOTHING TO DRINK AFTER 7:00 AM THE DAY OF SURGERY.  PLEASE TAKE THE FOLLOWING MEDICATIONS THE AM OF YOUR SURGERY WITH A FEW SIPS OF WATER:  CRESTOR, SERTRALINE, TRAMADOL      IF YOU ARE DIABETIC:  DO NOT TAKE ANY DIABETIC MEDICATIONS THE AM OF YOUR SURGERY.  IF YOU TAKE INSULIN IN THE EVENINGS--PLEASE ONLY TAKE 1/2 NORMAL EVENING DOSE THE NIGHT BEFORE YOUR SURGERY.  NO INSULIN THE AM OF YOUR SURGERY.   DO NOT BRING VALUABLES, MONEY, CREDIT CARDS.  CONTACT LENS, DENTURES / PARTIALS, GLASSES SHOULD NOT BE WORN TO SURGERY AND IN MOST CASES-HEARING AIDS WILL NEED TO BE REMOVED.  BRING YOUR GLASSES CASE, ANY EQUIPMENT NEEDED FOR YOUR CONTACT LENS. FOR PATIENTS ADMITTED TO THE HOSPITAL--CHECK OUT TIME THE DAY OF DISCHARGE IS 11:00 AM.  ALL INPATIENT ROOMS ARE PRIVATE - WITH BATHROOM, TELEPHONE, TELEVISION AND WIFI INTERNET. IF YOU ARE BEING DISCHARGED THE SAME DAY OF YOUR SURGERY--YOU CAN NOT DRIVE YOURSELF HOME--AND SHOULD NOT GO HOME ALONE BY TAXI OR BUS.  NO DRIVING OR OPERATING MACHINERY FOR 24 HOURS FOLLOWING ANESTHESIA / PAIN MEDICATIONS.                            SPECIAL INSTRUCTIONS:  CHLORHEXIDINE SOAP SHOWER (other brand names are Betasept and Hibiclens ) PLEASE SHOWER WITH CHLORHEXIDINE THE NIGHT BEFORE YOUR SURGERY AND THE AM OF YOUR SURGERY. DO NOT USE CHLORHEXIDINE ON YOUR FACE OR PRIVATE AREAS--YOU MAY USE YOUR NORMAL SOAP THOSE AREAS AND YOUR NORMAL SHAMPOO.  WOMEN SHOULD AVOID SHAVING UNDER ARMS AND SHAVING LEGS 48 HOURS BEFORE USING CHLORHEXIDINE  TO AVOID SKIN IRRITATION.  DO NOT USE IF ALLERGIC TO CHLORHEXIDINE.  PLEASE READ OVER ANY  FACT SHEETS THAT YOU WERE GIVEN: MRSA INFORMATION, BLOOD TRANSFUSION INFORMATION, INCENTIVE SPIROMETER INFORMATION.

## 2012-04-18 NOTE — Pre-Procedure Instructions (Signed)
PT HAS CARDIAC CLEARANCE FROM Shawn Route NP FOR DR. P. JORDAN--OFFICE NOTES 02/08/12 IN EPIC AND ON PT'S CHART. DR. Carolee Rota OFFICE FAXED PT'S EKG REPORT FROM 11/26/2011 WITH OFFICE NOTE--REPORT ON PT'S CHART. CXR, CBC, CMET, PT,PTT, UA, T/S WERE DRAWN TODAY AT [facility]. PREOP INSTRUCTIONS WERE DISCUSSED WITH PT AND HER DAUGHTER ANGELA--TEACH BACK METHOD USED WITH ANGELA-PT WAS UNABLE TO TEACH BACK AND UNABLE TO COMPREHEND WRITTEN COPY OF INSTRUCTIONS--HER DAUGHTER PLANS TO REVIEW AND HELP HER MOTHER THIS WEEK REGARDING PREOP INSTRUCTIONS.

## 2012-04-20 ENCOUNTER — Other Ambulatory Visit: Payer: Self-pay | Admitting: Orthopedic Surgery

## 2012-04-20 NOTE — H&P (Signed)
Adrienne [name]  DOB: 1936-06-06 Married / Language: English / Race: White Female  Date of Admission:  6i/05/2012  Chief Complaint:  Right Knee Pain  History of Present Illness The patient is a 76 year old female who comes in for a preoperative History and Physical. The patient is scheduled for a right total knee arthroplasty to be performed by Dr. Gus Rankin. Aluisio, MD at [facility] on 04/21/2012. The patient is a 76 year old female who presents with knee complaints. The patient is seen today in referral from Dr. Virgilio Belling. The patient reports left knee and right knee symptoms including: pain, swelling, instability and giving way which began 2 month(s) (or more) ago without any known injury. Note for "Knee pain": She had a cortisone injection by Dr. Dierdre Forth at [facility] on 1/29/13in the left knee. She brought xrays with her from Dr. Carolee Rota office. We will import the disc. She's had problems with both knees for a long time but it's gotten much worse in the past several months. Right knee has historically been a lot worse than the left. The left is occurring more acutely. The cortisone injection did help with that. It's to the point where the right hurts as much or more now than the left. She has significant dysfunction. She's not able to walk or do things she desires. She's had a precipitous decrease in function in the past year. She's had other injections in the past including Visco injections and really has not had tremendous improvement. She's here today to discuss other options. She's not having pain in her hips. She does have some back pain, but the main issue is the right worse than left knee. They have been treated conservatively in the past for the above stated problem and despite conservative measures, they continue to have progressive pain and severe functional limitations and dysfunction. They have failed non-operative management. It is felt that they  would benefit from undergoing total joint replacement. Risks and benefits of the procedure have been discussed with the patient and they elect to proceed with surgery. There are no active contraindications to surgery such as ongoing infection or rapidly progressive neurological disease.  Allergies SULFA   Family History Hypertension. mother Cancer. mother Father. Deceased. Mother. Deceased, Colon Cancer. age 14   Social History Marital status. married Living situation. live alone Pain Contract. no Tobacco use. never smoker Previously in rehab. no Illicit drug use. no Children. 1 Alcohol use. never consumed alcohol Current work status. retired Exercise. Exercises never Drug/Alcohol Rehab (Currently). no Post-Surgical Plans. Plan is to go to [facility] after the hospital stay. Advance Directives. Living Will, Healthcare POA   Medication History Acetaminophen (500MG  Capsule, Oral) Active. Losartan Potassium-HCTZ (100-12.5MG  Tablet, Oral) Active. Crestor (20MG  Tablet, Oral) Active. Fenofibrate (160MG  Tablet, Oral) Active. Aspirin Childrens Chew (81MG  Tablet Chewable, Oral) Active. Caltrate 600+D (600-400MG -UNIT Tablet Chewable, Oral) Active. Vitamin D (1000UNIT Capsule, Oral) Active. Sertraline HCl (50MG  Tablet, Oral) Active. TraMADol HCl (50MG  Tablet, Oral) Active. Alendronate Sodium (70MG  Tablet, Oral) Active. Toviaz (8MG  Tablet ER 24HR, Oral) Active. Centrum Silver ( Oral) Active. MetFORMIN HCl (500MG  Tablet, Oral) Active.   Past Surgical History Thyroidectomy; Total Hysterectomy. complete (non-cancerous)   Past Medical History Osteoarthritis Sleep Apnea High blood pressure Anxiety Disorder Depression Diabetes Mellitus, Type II Osteopenia Urinary Frequency Microhematuria Obesity Goiter Chronic Low Back Pain Chronic Neck Pain Allergic Rhinitis History of Chronic Urethritis History of Fatty Liver Multiple Colonic  Polyps Cataract. Bilateral   Review of Systems General:Present-  Night Sweats. Not Present- Chills, Fever, Fatigue, Weight Gain, Weight Loss and Memory Loss. Skin:Not Present- Hives, Itching, Rash, Eczema and Lesions. HEENT:Not Present- Tinnitus, Headache, Double Vision, Visual Loss, Hearing Loss and Dentures. Respiratory:Not Present- Shortness of breath with exertion, Shortness of breath at rest, Allergies, Coughing up blood and Chronic Cough. Cardiovascular:Not Present- Chest Pain, Racing/skipping heartbeats, Difficulty Breathing Lying Down, Murmur, Swelling and Palpitations. Gastrointestinal:Not Present- Bloody Stool, Heartburn, Abdominal Pain, Vomiting, Nausea, Constipation, Diarrhea, Difficulty Swallowing, Jaundice and Loss of appetitie. Female Genitourinary:Present- Incontinence and Urinating at Night. Not Present- Blood in Urine, Urinary frequency, Weak urinary stream, Discharge, Flank Pain, Painful Urination, Urgency and Urinary Retention. Musculoskeletal:Not Present- Muscle Weakness, Muscle Pain, Joint Swelling, Joint Pain, Back Pain, Morning Stiffness and Spasms. Neurological:Not Present- Tremor, Dizziness, Blackout spells, Paralysis, Difficulty with balance and Weakness. Psychiatric:Not Present- Insomnia.   Vitals Weight: 215 lb Height: 63 in Body Surface Area: 2.08 m Body Mass Index: 38.09 kg/m Pulse: 64 (Regular) Resp.: 14 (Unlabored) BP: 124/62 (Sitting, Right Arm, Standard)    Physical Exam The physical exam findings are as follows: Patient is a 76 year old female with continued knee pain. Patient is accompanied today by her daughter [name].   General Mental Status - Alert, cooperative and good historian. General Appearance- pleasant. Not in acute distress. Orientation- Oriented X3. Build & Nutrition- Well nourished and Well developed.   Head and Neck Head- normocephalic, atraumatic . Neck Global Assessment- supple. no bruit  auscultated on the right and no bruit auscultated on the left.   Eye Pupil- Bilateral- Regular and Round. Motion- Bilateral- EOMI. wears glasses  Chest and Lung Exam Auscultation: Breath sounds:- clear at anterior chest wall and - clear at posterior chest wall. Adventitious sounds:- No Adventitious sounds.   Cardiovascular Auscultation:Rhythm- Regular rate and rhythm. Heart Sounds- S1 WNL and S2 WNL. Murmurs & Other Heart Sounds:Auscultation of the heart reveals - No Murmurs.   Abdomen Inspection:Contour- Generalized moderate distention. Palpation/Percussion:Tenderness- Abdomen is non-tender to palpation. Rigidity (guarding)- Abdomen is soft. Auscultation:Auscultation of the abdomen reveals - Bowel sounds normal.   Female Genitourinary  Not done, not pertinent to present illness  Musculoskeletal  On exam, very pleasant, well-developed female. Alert and oriented, in no apparent distress. Her hips show normal ROM with no discomfort. Her right knee shows no effusion. There is a significant varus deformity. ROM is 10 to about 120. There is marked crepitus on ROM. She is tender medial greater than lateral with no instability. Left knee shows no effusion, no deformity. Range is 5-125. Moderate crepitus on ROM. Tender medial greater than lateral. No instability noted.  RADIOGRAPHS: AP of both knees and lateral from [facility] show that she has advanced end stage arthritis of both knees, right worse than left, bone on bone medial and patellofemoral with varus deformity, subchondral sclerosis and osteophyte formation.  Assessment & Plan Osteoarthritis Right Knee  Note: Patient is for a Right Total Knee Replacement by Dr. Lequita Halt.  Plan is to go to [facility] after the hospital stay.  PCP - Dr. Melina Fiddler  Cards - Dr. Peter [place] - Patient has been seen preoperatively and felt to be stable for surgery. She had a Myoview  Stress Test in January 2012 which was normal and found to have an estimated EF of 81%  Signed electronically by Roberts Gaudy, PA-C

## 2012-04-21 ENCOUNTER — Inpatient Hospital Stay (HOSPITAL_COMMUNITY)
Admission: RE | Admit: 2012-04-21 | Discharge: 2012-04-24 | DRG: 470 | Disposition: A | Discharge status: Skilled Nursing Facility | Payer: Medicare Other | Source: Ambulatory Visit | Attending: Orthopedic Surgery | Admitting: Orthopedic Surgery

## 2012-04-21 ENCOUNTER — Encounter (HOSPITAL_COMMUNITY): Payer: Self-pay | Admitting: *Deleted

## 2012-04-21 ENCOUNTER — Encounter (HOSPITAL_COMMUNITY): Payer: Self-pay

## 2012-04-21 ENCOUNTER — Encounter (HOSPITAL_COMMUNITY)
Admission: RE | Disposition: A | Discharge status: Skilled Nursing Facility | Payer: Self-pay | Source: Ambulatory Visit | Attending: Orthopedic Surgery

## 2012-04-21 ENCOUNTER — Ambulatory Visit (HOSPITAL_COMMUNITY): Payer: Medicare Other | Admitting: *Deleted

## 2012-04-21 DIAGNOSIS — M179 Osteoarthritis of knee, unspecified: Secondary | ICD-10-CM | POA: Diagnosis present

## 2012-04-21 DIAGNOSIS — F3289 Other specified depressive episodes: Secondary | ICD-10-CM | POA: Diagnosis present

## 2012-04-21 DIAGNOSIS — F329 Major depressive disorder, single episode, unspecified: Secondary | ICD-10-CM | POA: Diagnosis present

## 2012-04-21 DIAGNOSIS — I10 Essential (primary) hypertension: Secondary | ICD-10-CM | POA: Diagnosis present

## 2012-04-21 DIAGNOSIS — G4730 Sleep apnea, unspecified: Secondary | ICD-10-CM | POA: Diagnosis present

## 2012-04-21 DIAGNOSIS — E669 Obesity, unspecified: Secondary | ICD-10-CM | POA: Diagnosis present

## 2012-04-21 DIAGNOSIS — Z96659 Presence of unspecified artificial knee joint: Secondary | ICD-10-CM

## 2012-04-21 DIAGNOSIS — E119 Type 2 diabetes mellitus without complications: Secondary | ICD-10-CM | POA: Diagnosis present

## 2012-04-21 DIAGNOSIS — E039 Hypothyroidism, unspecified: Secondary | ICD-10-CM | POA: Diagnosis present

## 2012-04-21 DIAGNOSIS — M1710 Unilateral primary osteoarthritis, unspecified knee: Principal | ICD-10-CM | POA: Diagnosis present

## 2012-04-21 DIAGNOSIS — F411 Generalized anxiety disorder: Secondary | ICD-10-CM | POA: Diagnosis present

## 2012-04-21 DIAGNOSIS — M19079 Primary osteoarthritis, unspecified ankle and foot: Secondary | ICD-10-CM | POA: Diagnosis present

## 2012-04-21 HISTORY — PX: TOTAL KNEE ARTHROPLASTY: SHX125

## 2012-04-21 LAB — GLUCOSE, CAPILLARY
Glucose-Capillary: 102 mg/dL — ABNORMAL HIGH (ref 70–99)
Glucose-Capillary: 102 mg/dL — ABNORMAL HIGH (ref 70–99)

## 2012-04-21 LAB — TYPE AND SCREEN

## 2012-04-21 SURGERY — ARTHROPLASTY, KNEE, TOTAL
Anesthesia: Spinal | Site: Knee | Laterality: Right | Wound class: Clean

## 2012-04-21 MED ORDER — METOCLOPRAMIDE HCL 10 MG PO TABS: 5.0000 mg | ORAL_TABLET | Freq: Three times a day (TID) | ORAL | Status: DC | PRN

## 2012-04-21 MED ORDER — CEFAZOLIN SODIUM-DEXTROSE 2-3 GM-% IV SOLR
2.0000 g | INTRAVENOUS | Status: AC
  Administered 2012-04-21: 2 g via INTRAVENOUS

## 2012-04-21 MED ORDER — DIPHENHYDRAMINE HCL 12.5 MG/5ML PO ELIX: 12.5000 mg | ORAL_SOLUTION | ORAL | Status: DC | PRN

## 2012-04-21 MED ORDER — FLEET ENEMA 7-19 GM/118ML RE ENEM: 1.0000 | ENEMA | Freq: Once | RECTAL | Status: AC | PRN

## 2012-04-21 MED ORDER — HYDROCHLOROTHIAZIDE 10 MG/ML ORAL SUSPENSION
6.2500 mg | Freq: Every day | ORAL | Status: DC
  Administered 2012-04-21 – 2012-04-24 (×4): 6.25 mg via ORAL
  Filled 2012-04-21 (×5): qty 1.25

## 2012-04-21 MED ORDER — LACTATED RINGERS IV SOLN
INTRAVENOUS | Status: DC
  Administered 2012-04-21: 15:00:00 via INTRAVENOUS
  Administered 2012-04-21: 1000 mL via INTRAVENOUS

## 2012-04-21 MED ORDER — ACETAMINOPHEN 10 MG/ML IV SOLN
1000.0000 mg | Freq: Four times a day (QID) | INTRAVENOUS | Status: AC
  Administered 2012-04-21 – 2012-04-22 (×4): 1000 mg via INTRAVENOUS
  Filled 2012-04-21 (×5): qty 100

## 2012-04-21 MED ORDER — PROMETHAZINE HCL 25 MG/ML IJ SOLN: 6.2500 mg | INTRAMUSCULAR | Status: DC | PRN

## 2012-04-21 MED ORDER — PHENOL 1.4 % MT LIQD: 1.0000 | OROMUCOSAL | Status: DC | PRN

## 2012-04-21 MED ORDER — DEXAMETHASONE SODIUM PHOSPHATE 10 MG/ML IJ SOLN
10.0000 mg | Freq: Once | INTRAMUSCULAR | Status: AC
  Administered 2012-04-21: 10 mg via INTRAVENOUS
  Filled 2012-04-21: qty 1

## 2012-04-21 MED ORDER — HETASTARCH-ELECTROLYTES 6 % IV SOLN
INTRAVENOUS | Status: DC | PRN
  Administered 2012-04-21: 14:00:00 via INTRAVENOUS

## 2012-04-21 MED ORDER — BUPIVACAINE 0.25 % ON-Q PUMP SINGLE CATH 300ML
INJECTION | Status: DC | PRN
  Administered 2012-04-21: 300 mL

## 2012-04-21 MED ORDER — FESOTERODINE FUMARATE ER 8 MG PO TB24
8.0000 mg | ORAL_TABLET | Freq: Every evening | ORAL | Status: DC
  Administered 2012-04-21 – 2012-04-23 (×3): 8 mg via ORAL
  Filled 2012-04-21 (×5): qty 1

## 2012-04-21 MED ORDER — LACTATED RINGERS IV SOLN: INTRAVENOUS | Status: DC

## 2012-04-21 MED ORDER — INSULIN ASPART 100 UNIT/ML ~~LOC~~ SOLN
0.0000 [IU] | Freq: Three times a day (TID) | SUBCUTANEOUS | Status: DC
  Administered 2012-04-22: 2 [IU] via SUBCUTANEOUS
  Administered 2012-04-22: 3 [IU] via SUBCUTANEOUS
  Administered 2012-04-23: 2 [IU] via SUBCUTANEOUS
  Administered 2012-04-23: 3 [IU] via SUBCUTANEOUS
  Administered 2012-04-24: 2 [IU] via SUBCUTANEOUS

## 2012-04-21 MED ORDER — ONDANSETRON HCL 4 MG PO TABS: 4.0000 mg | ORAL_TABLET | Freq: Four times a day (QID) | ORAL | Status: DC | PRN

## 2012-04-21 MED ORDER — LOSARTAN POTASSIUM-HCTZ 100-12.5 MG PO TABS: 0.5000 | ORAL_TABLET | Freq: Every day | ORAL | Status: DC

## 2012-04-21 MED ORDER — CEFAZOLIN SODIUM 1-5 GM-% IV SOLN
1.0000 g | Freq: Four times a day (QID) | INTRAVENOUS | Status: AC
  Administered 2012-04-21 – 2012-04-22 (×2): 1 g via INTRAVENOUS
  Filled 2012-04-21 (×2): qty 50

## 2012-04-21 MED ORDER — MENTHOL 3 MG MT LOZG: 1.0000 | LOZENGE | OROMUCOSAL | Status: DC | PRN

## 2012-04-21 MED ORDER — MIDAZOLAM HCL 5 MG/5ML IJ SOLN
INTRAMUSCULAR | Status: DC | PRN
  Administered 2012-04-21: 2 mg via INTRAVENOUS

## 2012-04-21 MED ORDER — MEPERIDINE HCL 50 MG/ML IJ SOLN: 6.2500 mg | INTRAMUSCULAR | Status: DC | PRN

## 2012-04-21 MED ORDER — ACETAMINOPHEN 650 MG RE SUPP: 650.0000 mg | Freq: Four times a day (QID) | RECTAL | Status: DC | PRN

## 2012-04-21 MED ORDER — SODIUM CHLORIDE 0.9 % IR SOLN
Status: DC | PRN
  Administered 2012-04-21: 1000 mL

## 2012-04-21 MED ORDER — SERTRALINE HCL 50 MG PO TABS
50.0000 mg | ORAL_TABLET | Freq: Every day | ORAL | Status: DC
  Administered 2012-04-22 – 2012-04-24 (×3): 50 mg via ORAL
  Filled 2012-04-21 (×3): qty 1

## 2012-04-21 MED ORDER — ACETAMINOPHEN 325 MG PO TABS: 650.0000 mg | ORAL_TABLET | Freq: Four times a day (QID) | ORAL | Status: DC | PRN

## 2012-04-21 MED ORDER — METOCLOPRAMIDE HCL 5 MG/ML IJ SOLN: 5.0000 mg | Freq: Three times a day (TID) | INTRAMUSCULAR | Status: DC | PRN

## 2012-04-21 MED ORDER — PROPOFOL 10 MG/ML IV BOLUS
INTRAVENOUS | Status: DC | PRN
  Administered 2012-04-21: 30 mg via INTRAVENOUS

## 2012-04-21 MED ORDER — CEFAZOLIN SODIUM-DEXTROSE 2-3 GM-% IV SOLR
INTRAVENOUS | Status: AC
  Filled 2012-04-21: qty 50

## 2012-04-21 MED ORDER — ONDANSETRON HCL 4 MG/2ML IJ SOLN
INTRAMUSCULAR | Status: DC | PRN
  Administered 2012-04-21: 4 mg via INTRAVENOUS

## 2012-04-21 MED ORDER — BUPIVACAINE IN DEXTROSE 0.75-8.25 % IT SOLN
INTRATHECAL | Status: DC | PRN
  Administered 2012-04-21: 1.8 mL via INTRATHECAL

## 2012-04-21 MED ORDER — MORPHINE SULFATE 2 MG/ML IJ SOLN
1.0000 mg | INTRAMUSCULAR | Status: DC | PRN
  Administered 2012-04-21 (×2): 2 mg via INTRAVENOUS
  Administered 2012-04-21: 1 mg via INTRAVENOUS
  Administered 2012-04-22 (×3): 2 mg via INTRAVENOUS
  Filled 2012-04-21 (×6): qty 1

## 2012-04-21 MED ORDER — METFORMIN HCL 500 MG PO TABS
500.0000 mg | ORAL_TABLET | Freq: Two times a day (BID) | ORAL | Status: DC
  Administered 2012-04-21 – 2012-04-24 (×6): 500 mg via ORAL
  Filled 2012-04-21 (×8): qty 1

## 2012-04-21 MED ORDER — POTASSIUM CHLORIDE IN NACL 20-0.9 MEQ/L-% IV SOLN
INTRAVENOUS | Status: DC
  Administered 2012-04-21 – 2012-04-23 (×3): via INTRAVENOUS
  Filled 2012-04-21 (×6): qty 1000

## 2012-04-21 MED ORDER — ATORVASTATIN CALCIUM 40 MG PO TABS
40.0000 mg | ORAL_TABLET | Freq: Every day | ORAL | Status: DC
  Administered 2012-04-21 – 2012-04-23 (×3): 40 mg via ORAL
  Filled 2012-04-21 (×4): qty 1

## 2012-04-21 MED ORDER — POLYETHYLENE GLYCOL 3350 17 G PO PACK: 17.0000 g | PACK | Freq: Every day | ORAL | Status: DC | PRN

## 2012-04-21 MED ORDER — METHOCARBAMOL 100 MG/ML IJ SOLN
500.0000 mg | Freq: Four times a day (QID) | INTRAVENOUS | Status: DC | PRN
  Administered 2012-04-21: 500 mg via INTRAVENOUS
  Filled 2012-04-21: qty 5

## 2012-04-21 MED ORDER — ONDANSETRON HCL 4 MG/2ML IJ SOLN
4.0000 mg | Freq: Four times a day (QID) | INTRAMUSCULAR | Status: DC | PRN
  Administered 2012-04-23: 4 mg via INTRAVENOUS
  Filled 2012-04-21: qty 2

## 2012-04-21 MED ORDER — FENOFIBRATE 160 MG PO TABS
160.0000 mg | ORAL_TABLET | Freq: Every evening | ORAL | Status: DC
  Administered 2012-04-21 – 2012-04-23 (×3): 160 mg via ORAL
  Filled 2012-04-21 (×4): qty 1

## 2012-04-21 MED ORDER — BUPIVACAINE 0.25 % ON-Q PUMP SINGLE CATH 300ML
INJECTION | Status: AC
  Filled 2012-04-21: qty 300

## 2012-04-21 MED ORDER — SODIUM CHLORIDE 0.9 % IV SOLN: INTRAVENOUS | Status: DC

## 2012-04-21 MED ORDER — BUPIVACAINE ON-Q PAIN PUMP (FOR ORDER SET NO CHG)
INJECTION | Status: DC
Start: 2012-04-21 — End: 2012-04-24
  Filled 2012-04-21: qty 1

## 2012-04-21 MED ORDER — RIVAROXABAN 10 MG PO TABS
10.0000 mg | ORAL_TABLET | Freq: Every day | ORAL | Status: DC
  Administered 2012-04-22 – 2012-04-24 (×3): 10 mg via ORAL
  Filled 2012-04-21 (×4): qty 1

## 2012-04-21 MED ORDER — ACETAMINOPHEN 10 MG/ML IV SOLN
1000.0000 mg | Freq: Once | INTRAVENOUS | Status: AC
  Administered 2012-04-21: 1000 mg via INTRAVENOUS
  Filled 2012-04-21: qty 100

## 2012-04-21 MED ORDER — OXYCODONE HCL 5 MG PO TABS
5.0000 mg | ORAL_TABLET | ORAL | Status: DC | PRN
  Administered 2012-04-21: 10 mg via ORAL
  Administered 2012-04-21: 5 mg via ORAL
  Administered 2012-04-22 – 2012-04-23 (×9): 10 mg via ORAL
  Administered 2012-04-24: 5 mg via ORAL
  Administered 2012-04-24: 10 mg via ORAL
  Administered 2012-04-24: 5 mg via ORAL
  Filled 2012-04-21: qty 2
  Filled 2012-04-21: qty 1
  Filled 2012-04-21 (×7): qty 2
  Filled 2012-04-21 (×2): qty 1
  Filled 2012-04-21 (×3): qty 2

## 2012-04-21 MED ORDER — LOSARTAN POTASSIUM 50 MG PO TABS
50.0000 mg | ORAL_TABLET | Freq: Every day | ORAL | Status: DC
  Administered 2012-04-21 – 2012-04-24 (×4): 50 mg via ORAL
  Filled 2012-04-21 (×4): qty 1

## 2012-04-21 MED ORDER — ACETAMINOPHEN 10 MG/ML IV SOLN
INTRAVENOUS | Status: AC
  Filled 2012-04-21: qty 100

## 2012-04-21 MED ORDER — PROPOFOL 10 MG/ML IV EMUL
INTRAVENOUS | Status: DC | PRN
  Administered 2012-04-21: 120 ug/kg/min via INTRAVENOUS

## 2012-04-21 MED ORDER — FENTANYL CITRATE 0.05 MG/ML IJ SOLN
INTRAMUSCULAR | Status: DC | PRN
  Administered 2012-04-21: 100 ug via INTRAVENOUS

## 2012-04-21 MED ORDER — DOCUSATE SODIUM 100 MG PO CAPS
100.0000 mg | ORAL_CAPSULE | Freq: Two times a day (BID) | ORAL | Status: DC
  Administered 2012-04-22 – 2012-04-24 (×5): 100 mg via ORAL

## 2012-04-21 MED ORDER — CHLORHEXIDINE GLUCONATE 4 % EX LIQD
60.0000 mL | Freq: Once | CUTANEOUS | Status: DC
  Filled 2012-04-21: qty 60

## 2012-04-21 MED ORDER — METHOCARBAMOL 500 MG PO TABS
500.0000 mg | ORAL_TABLET | Freq: Four times a day (QID) | ORAL | Status: DC | PRN
  Administered 2012-04-21 – 2012-04-23 (×6): 500 mg via ORAL
  Filled 2012-04-21 (×6): qty 1

## 2012-04-21 MED ORDER — BUPIVACAINE 0.25 % ON-Q PUMP SINGLE CATH 300ML
300.0000 mL | INJECTION | Status: DC
  Filled 2012-04-21: qty 300

## 2012-04-21 MED ORDER — HYDROMORPHONE HCL PF 1 MG/ML IJ SOLN
0.2500 mg | INTRAMUSCULAR | Status: DC | PRN
  Administered 2012-04-21 (×2): 0.5 mg via INTRAVENOUS

## 2012-04-21 MED ORDER — BISACODYL 10 MG RE SUPP: 10.0000 mg | Freq: Every day | RECTAL | Status: DC | PRN

## 2012-04-21 MED ORDER — HYDROMORPHONE HCL PF 1 MG/ML IJ SOLN
INTRAMUSCULAR | Status: AC
  Filled 2012-04-21: qty 1

## 2012-04-21 SURGICAL SUPPLY — 49 items
BAG ZIPLOCK 12X15 (MISCELLANEOUS) ×2 IMPLANT
BANDAGE ELASTIC 6 VELCRO ST LF (GAUZE/BANDAGES/DRESSINGS) ×2 IMPLANT
BANDAGE ESMARK 6X9 LF (GAUZE/BANDAGES/DRESSINGS) ×1 IMPLANT
BLADE SAG 18X100X1.27 (BLADE) ×2 IMPLANT
BLADE SAW SGTL 11.0X1.19X90.0M (BLADE) ×2 IMPLANT
BNDG ESMARK 6X9 LF (GAUZE/BANDAGES/DRESSINGS) ×2
BOWL SMART MIX CTS (DISPOSABLE) ×2 IMPLANT
CATH KIT ON-Q SILVERSOAK 5IN (CATHETERS) ×2 IMPLANT
CEMENT HV SMART SET (Cement) ×4 IMPLANT
CLOTH BEACON ORANGE TIMEOUT ST (SAFETY) ×2 IMPLANT
CUFF TOURN SGL QUICK 34 (TOURNIQUET CUFF) ×1
CUFF TRNQT CYL 34X4X40X1 (TOURNIQUET CUFF) ×1 IMPLANT
DRAPE EXTREMITY T 121X128X90 (DRAPE) ×2 IMPLANT
DRAPE POUCH INSTRU U-SHP 10X18 (DRAPES) ×2 IMPLANT
DRAPE U-SHAPE 47X51 STRL (DRAPES) ×2 IMPLANT
DRSG ADAPTIC 3X8 NADH LF (GAUZE/BANDAGES/DRESSINGS) ×2 IMPLANT
DRSG PAD ABDOMINAL 8X10 ST (GAUZE/BANDAGES/DRESSINGS) ×2 IMPLANT
DURAPREP 26ML APPLICATOR (WOUND CARE) ×2 IMPLANT
ELECT REM PT RETURN 9FT ADLT (ELECTROSURGICAL) ×2
ELECTRODE REM PT RTRN 9FT ADLT (ELECTROSURGICAL) ×1 IMPLANT
EVACUATOR 1/8 PVC DRAIN (DRAIN) ×2 IMPLANT
FACESHIELD LNG OPTICON STERILE (SAFETY) ×10 IMPLANT
GLOVE BIO SURGEON STRL SZ7.5 (GLOVE) ×2 IMPLANT
GLOVE BIO SURGEON STRL SZ8 (GLOVE) ×2 IMPLANT
GLOVE BIOGEL PI IND STRL 8 (GLOVE) ×2 IMPLANT
GLOVE BIOGEL PI INDICATOR 8 (GLOVE) ×2
GOWN STRL NON-REIN LRG LVL3 (GOWN DISPOSABLE) ×2 IMPLANT
GOWN STRL REIN XL XLG (GOWN DISPOSABLE) ×2 IMPLANT
HANDPIECE INTERPULSE COAX TIP (DISPOSABLE) ×1
IMMOBILIZER KNEE 20 (SOFTGOODS) ×2
IMMOBILIZER KNEE 20 THIGH 36 (SOFTGOODS) ×1 IMPLANT
KIT BASIN OR (CUSTOM PROCEDURE TRAY) ×2 IMPLANT
MANIFOLD NEPTUNE II (INSTRUMENTS) ×2 IMPLANT
NS IRRIG 1000ML POUR BTL (IV SOLUTION) ×2 IMPLANT
PACK TOTAL JOINT (CUSTOM PROCEDURE TRAY) ×2 IMPLANT
PADDING CAST COTTON 6X4 STRL (CAST SUPPLIES) ×6 IMPLANT
POSITIONER SURGICAL ARM (MISCELLANEOUS) ×2 IMPLANT
SET HNDPC FAN SPRY TIP SCT (DISPOSABLE) ×1 IMPLANT
SPONGE GAUZE 4X4 12PLY (GAUZE/BANDAGES/DRESSINGS) ×2 IMPLANT
STRIP CLOSURE SKIN 1/2X4 (GAUZE/BANDAGES/DRESSINGS) ×4 IMPLANT
SUCTION FRAZIER 12FR DISP (SUCTIONS) ×2 IMPLANT
SUT MNCRL AB 4-0 PS2 18 (SUTURE) ×2 IMPLANT
SUT VIC AB 2-0 CT1 27 (SUTURE) ×3
SUT VIC AB 2-0 CT1 TAPERPNT 27 (SUTURE) ×3 IMPLANT
SUT VLOC 180 0 24IN GS25 (SUTURE) ×2 IMPLANT
TOWEL OR 17X26 10 PK STRL BLUE (TOWEL DISPOSABLE) ×4 IMPLANT
TRAY FOLEY CATH 14FRSI W/METER (CATHETERS) ×2 IMPLANT
WATER STERILE IRR 1500ML POUR (IV SOLUTION) ×2 IMPLANT
WRAP KNEE MAXI GEL POST OP (GAUZE/BANDAGES/DRESSINGS) ×2 IMPLANT

## 2012-04-21 NOTE — Op Note (Signed)
Pre-operative diagnosis- Osteoarthritis  Right knee(s)  Post-operative diagnosis- Osteoarthritis Right knee(s)  Procedure-  Right  Total Knee Arthroplasty  Surgeon- Gus Rankin. Tyree Fluharty, MD  Assistant- Avel Peace, PA-C   Anesthesia-  Spinal EBL-* No blood loss amount entered *  Drains Hemovac  Tourniquet time-  Total Tourniquet Time Documented: Thigh (Right) - 33 minutes   Complications- None  Condition-PACU - hemodynamically stable.   Brief Clinical Note  Adrienne [name] is a 76 y.o. year old female with end stage OA of her right knee with progressively worsening pain and dysfunction. She has constant pain, with activity and at rest and significant functional deficits with difficulties even with ADLs. She has had extensive non-op management including analgesics, injections of cortisone and viscosupplements, and home exercise program, but remains in significant pain with significant dysfunction.Radiographs show bone on bone arthritis all 3 compartments with varus deformity and tibial subluxation. She presents now for left Total Knee Arthroplasty.    Procedure in detail---   The patient is brought into the operating room and positioned supine on the operating table. After successful administration of  Spinal,   a tourniquet is placed high on the  Right thigh(s) and the lower extremity is prepped and draped in the usual sterile fashion. Time out is performed by the operating team and then the  Right lower extremity is wrapped in Esmarch, knee flexed and the tourniquet inflated to 300 mmHg.       A midline incision is made with a ten blade through the subcutaneous tissue to the level of the extensor mechanism. A fresh blade is used to make a medial parapatellar arthrotomy. Soft tissue over the proximal medial tibia is subperiosteally elevated to the joint line with a knife and into the semimembranosus bursa with a Cobb elevator. Soft tissue over the proximal lateral tibia is elevated with  attention being paid to avoiding the patellar tendon on the tibial tubercle. The patella is everted, knee flexed 90 degrees and the ACL and PCL are removed. Findings are bone on bone medial and patellofemoral with large global osteophytes.        The drill is used to create a starting hole in the distal femur and the canal is thoroughly irrigated with sterile saline to remove the fatty contents. The 5 degree Right  valgus alignment guide is placed into the femoral canal and the distal femoral cutting block is pinned to remove 11 mm off the distal femur. Resection is made with an oscillating saw.      The tibia is subluxed forward and the menisci are removed. The extramedullary alignment guide is placed referencing proximally at the medial aspect of the tibial tubercle and distally along the second metatarsal axis and tibial crest. The block is pinned to remove 2mm off the more deficient medial  side. Resection is made with an oscillating saw. Size 3is the most appropriate size for the tibia and the proximal tibia is prepared with the modular drill and keel punch for that size.      The femoral sizing guide is placed and size 4 narrow is most appropriate. Rotation is marked off the epicondylar axis and confirmed by creating a rectangular flexion gap at 90 degrees. The size 4 cutting block is pinned in this rotation and the anterior, posterior and chamfer cuts are made with the oscillating saw. The intercondylar block is then placed and that cut is made.      Trial size 3 tibial component, trial size 4 narrow  posterior stabilized femur and a 10  mm posterior stabilized rotating platform insert trial is placed. Full extension is achieved with excellent varus/valgus and anterior/posterior balance throughout full range of motion. The patella is everted and thickness measured to be 22  mm. Free hand resection is taken to 12 mm, a 35 template is placed, lug holes are drilled, trial patella is placed, and it tracks  normally. Osteophytes are removed off the posterior femur with the trial in place. All trials are removed and the cut bone surfaces prepared with pulsatile lavage. Cement is mixed and once ready for implantation, the size 3 tibial implant, size  4 narrow posterior stabilized femoral component, and the size 35 patella are cemented in place and the patella is held with the clamp. The trial insert is placed and the knee held in full extension. All extruded cement is removed and once the cement is hard the permanent 10 mm posterior stabilized rotating platform insert is placed into the tibial tray.      The wound is copiously irrigated with saline solution and the extensor mechanism closed over a hemovac drain with #1 PDS suture. The tourniquet is released for a total tourniquet time of 33  minutes. Flexion against gravity is 140 degrees and the patella tracks normally. Subcutaneous tissue is closed with 2.0 vicryl and subcuticular with running 4.0 Monocryl. The catheter for the Marcaine pain pump is placed and the pump is initiated. The incision is cleaned and dried and steri-strips and a bulky sterile dressing are applied. The limb is placed into a knee immobilizer and the patient is awakened and transported to recovery in stable condition.      Please note that a surgical assistant was a medical necessity for this procedure in order to perform it in a safe and expeditious manner. Surgical assistant was necessary to retract the ligaments and vital neurovascular structures to prevent injury to them and also necessary for proper positioning of the limb to allow for anatomic placement of the prosthesis.   Gus Rankin Elwyn Klosinski, MD    04/21/2012, 2:47 PM

## 2012-04-21 NOTE — Progress Notes (Signed)
Painful and swollen left ankle that is a significant change from PST visit

## 2012-04-21 NOTE — Progress Notes (Signed)
UR COMPLETED  

## 2012-04-21 NOTE — Plan of Care (Signed)
Problem: Consults Goal: Diagnosis- Total Joint Replacement Primary Total Knee     

## 2012-04-21 NOTE — Transfer of Care (Signed)
Immediate Anesthesia Transfer of Care Note  Patient: Adrienne [name]  Procedure(s) Performed: Procedure(s) (LRB): TOTAL KNEE ARTHROPLASTY (Right)  Patient Location: PACU  Anesthesia Type: MAC combined with regional for post-op pain  Level of Consciousness: awake, alert  and confused  Airway & Oxygen Therapy: Patient Spontanous Breathing and Patient connected to face mask oxygen  Post-op Assessment: Report given to PACU RN and Post -op Vital signs reviewed and stable  Post vital signs: Reviewed and stable  Complications: No apparent anesthesia complications

## 2012-04-21 NOTE — H&P (View-Only) (Signed)
Adrienne [name]  DOB: 09/16/1936 Married / Language: English / Race: White Female  Date of Admission:  6i/05/2012  Chief Complaint:  Right Knee Pain  History of Present Illness The patient is a 76 year old female who comes in for a preoperative History and Physical. The patient is scheduled for a right total knee arthroplasty to be performed by Dr. Frank V. Aluisio, MD at [facility] on 04/21/2012. The patient is a 75 year old female who presents with knee complaints. The patient is seen today in referral from Dr. Walt Pharr. The patient reports left knee and right knee symptoms including: pain, swelling, instability and giving way which began 2 month(s) (or more) ago without any known injury. Note for "Knee pain": She had a cortisone injection by Dr. Beekman at [facility] on 1/29/13in the left knee. She brought xrays with her from Dr. Pharr's office. We will import the disc. She's had problems with both knees for a long time but it's gotten much worse in the past several months. Right knee has historically been a lot worse than the left. The left is occurring more acutely. The cortisone injection did help with that. It's to the point where the right hurts as much or more now than the left. She has significant dysfunction. She's not able to walk or do things she desires. She's had a precipitous decrease in function in the past year. She's had other injections in the past including Visco injections and really has not had tremendous improvement. She's here today to discuss other options. She's not having pain in her hips. She does have some back pain, but the main issue is the right worse than left knee. They have been treated conservatively in the past for the above stated problem and despite conservative measures, they continue to have progressive pain and severe functional limitations and dysfunction. They have failed non-operative management. It is felt that they  would benefit from undergoing total joint replacement. Risks and benefits of the procedure have been discussed with the patient and they elect to proceed with surgery. There are no active contraindications to surgery such as ongoing infection or rapidly progressive neurological disease.  Allergies SULFA   Family History Hypertension. mother Cancer. mother Father. Deceased. Mother. Deceased, Colon Cancer. age 94   Social History Marital status. married Living situation. live alone Pain Contract. no Tobacco use. never smoker Previously in rehab. no Illicit drug use. no Children. 1 Alcohol use. never consumed alcohol Current work status. retired Exercise. Exercises never Drug/Alcohol Rehab (Currently). no Post-Surgical Plans. Plan is to go to [facility] after the hospital stay. Advance Directives. Living Will, Healthcare POA   Medication History Acetaminophen (500MG Capsule, Oral) Active. Losartan Potassium-HCTZ (100-12.5MG Tablet, Oral) Active. Crestor (20MG Tablet, Oral) Active. Fenofibrate (160MG Tablet, Oral) Active. Aspirin Childrens Chew (81MG Tablet Chewable, Oral) Active. Caltrate 600+D (600-400MG-UNIT Tablet Chewable, Oral) Active. Vitamin D (1000UNIT Capsule, Oral) Active. Sertraline HCl (50MG Tablet, Oral) Active. TraMADol HCl (50MG Tablet, Oral) Active. Alendronate Sodium (70MG Tablet, Oral) Active. Toviaz (8MG Tablet ER 24HR, Oral) Active. Centrum Silver ( Oral) Active. MetFORMIN HCl (500MG Tablet, Oral) Active.   Past Surgical History Thyroidectomy; Total Hysterectomy. complete (non-cancerous)   Past Medical History Osteoarthritis Sleep Apnea High blood pressure Anxiety Disorder Depression Diabetes Mellitus, Type II Osteopenia Urinary Frequency Microhematuria Obesity Goiter Chronic Low Back Pain Chronic Neck Pain Allergic Rhinitis History of Chronic Urethritis History of Fatty Liver Multiple Colonic  Polyps Cataract. Bilateral   Review of Systems General:Present-   Night Sweats. Not Present- Chills, Fever, Fatigue, Weight Gain, Weight Loss and Memory Loss. Skin:Not Present- Hives, Itching, Rash, Eczema and Lesions. HEENT:Not Present- Tinnitus, Headache, Double Vision, Visual Loss, Hearing Loss and Dentures. Respiratory:Not Present- Shortness of breath with exertion, Shortness of breath at rest, Allergies, Coughing up blood and Chronic Cough. Cardiovascular:Not Present- Chest Pain, Racing/skipping heartbeats, Difficulty Breathing Lying Down, Murmur, Swelling and Palpitations. Gastrointestinal:Not Present- Bloody Stool, Heartburn, Abdominal Pain, Vomiting, Nausea, Constipation, Diarrhea, Difficulty Swallowing, Jaundice and Loss of appetitie. Female Genitourinary:Present- Incontinence and Urinating at Night. Not Present- Blood in Urine, Urinary frequency, Weak urinary stream, Discharge, Flank Pain, Painful Urination, Urgency and Urinary Retention. Musculoskeletal:Not Present- Muscle Weakness, Muscle Pain, Joint Swelling, Joint Pain, Back Pain, Morning Stiffness and Spasms. Neurological:Not Present- Tremor, Dizziness, Blackout spells, Paralysis, Difficulty with balance and Weakness. Psychiatric:Not Present- Insomnia.   Vitals Weight: 215 lb Height: 63 in Body Surface Area: 2.08 m Body Mass Index: 38.09 kg/m Pulse: 64 (Regular) Resp.: 14 (Unlabored) BP: 124/62 (Sitting, Right Arm, Standard)    Physical Exam The physical exam findings are as follows: Patient is a 76 year old female with continued knee pain. Patient is accompanied today by her daughter Angela.   General Mental Status - Alert, cooperative and good historian. General Appearance- pleasant. Not in acute distress. Orientation- Oriented X3. Build & Nutrition- Well nourished and Well developed.   Head and Neck Head- normocephalic, atraumatic . Neck Global Assessment- supple. no bruit  auscultated on the right and no bruit auscultated on the left.   Eye Pupil- Bilateral- Regular and Round. Motion- Bilateral- EOMI. wears glasses  Chest and Lung Exam Auscultation: Breath sounds:- clear at anterior chest wall and - clear at posterior chest wall. Adventitious sounds:- No Adventitious sounds.   Cardiovascular Auscultation:Rhythm- Regular rate and rhythm. Heart Sounds- S1 WNL and S2 WNL. Murmurs & Other Heart Sounds:Auscultation of the heart reveals - No Murmurs.   Abdomen Inspection:Contour- Generalized moderate distention. Palpation/Percussion:Tenderness- Abdomen is non-tender to palpation. Rigidity (guarding)- Abdomen is soft. Auscultation:Auscultation of the abdomen reveals - Bowel sounds normal.   Female Genitourinary  Not done, not pertinent to present illness  Musculoskeletal  On exam, very pleasant, well-developed female. Alert and oriented, in no apparent distress. Her hips show normal ROM with no discomfort. Her right knee shows no effusion. There is a significant varus deformity. ROM is 10 to about 120. There is marked crepitus on ROM. She is tender medial greater than lateral with no instability. Left knee shows no effusion, no deformity. Range is 5-125. Moderate crepitus on ROM. Tender medial greater than lateral. No instability noted.  RADIOGRAPHS: AP of both knees and lateral from [facility] show that she has advanced end stage arthritis of both knees, right worse than left, bone on bone medial and patellofemoral with varus deformity, subchondral sclerosis and osteophyte formation.  Assessment & Plan Osteoarthritis Right Knee  Note: Patient is for a Right Total Knee Replacement by Dr. Aluisio.  Plan is to go to [facility] after the hospital stay.  PCP - Dr. Wlater Pharr  Cards - Dr. Peter Jordan - Patient has been seen preoperatively and felt to be stable for surgery. She had a Myoview  Stress Test in January 2012 which was normal and found to have an estimated EF of 81%  Signed electronically by DREW L Alliana Mcauliff, PA-C 

## 2012-04-21 NOTE — Anesthesia Postprocedure Evaluation (Signed)
  Anesthesia Post-op Note  Patient: Adrienne [name]  Procedure(s) Performed: Procedure(s) (LRB): TOTAL KNEE ARTHROPLASTY (Right)  Patient Location: PACU  Anesthesia Type: Spinal  Level of Consciousness: awake and alert   Airway and Oxygen Therapy: Patient Spontanous Breathing  Post-op Pain: mild  Post-op Assessment: Post-op Vital signs reviewed, Patient's Cardiovascular Status Stable, Respiratory Function Stable, Patent Airway and No signs of Nausea or vomiting  Post-op Vital Signs: stable  Complications: No apparent anesthesia complications

## 2012-04-21 NOTE — Interval H&P Note (Signed)
History and Physical Interval Note:  04/21/2012 1:35 PM  Adrienne [name]  has presented today for surgery, with the diagnosis of Osteoarthritis of the Right Knee  The various methods of treatment have been discussed with the patient and family. After consideration of risks, benefits and other options for treatment, the patient has consented to  Procedure(s) (LRB): TOTAL KNEE ARTHROPLASTY (Right) as a surgical intervention .  The patients' history has been reviewed, patient examined, no change in status, stable for surgery.  I have reviewed the patients' chart and labs.  Questions were answered to the patient's satisfaction.     Loanne Drilling

## 2012-04-21 NOTE — Preoperative (Signed)
Beta Blockers   Reason not to administer Beta Blockers:Not Applicable 

## 2012-04-21 NOTE — Anesthesia Procedure Notes (Signed)
Spinal Patient location during procedure: OR Staffing Anesthesiologist: Chalene Treu Performed by: anesthesiologist  Preanesthetic Checklist Completed: patient identified, site marked, surgical consent, pre-op evaluation, timeout performed, IV checked, risks and benefits discussed and monitors and equipment checked Spinal Block Patient position: sitting Prep: Betadine Patient monitoring: heart rate, continuous pulse ox and blood pressure Approach: left paramedian Location: L3-4 Injection technique: single-shot Needle Needle type: Spinocan  Needle gauge: 22 G Needle length: 9 cm Additional Notes Expiration date of kit checked and confirmed. Patient tolerated procedure well, without complications.     

## 2012-04-21 NOTE — Anesthesia Preprocedure Evaluation (Addendum)
Anesthesia Evaluation  Patient identified by MRN, date of birth, ID band Patient awake    Reviewed: Allergy & Precautions, H&P , NPO status , Patient's Chart, lab work & pertinent test results  Airway Mallampati: II TM Distance: >3 FB Neck ROM: Full    Dental No notable dental hx.    Pulmonary neg pulmonary ROS, sleep apnea ,  breath sounds clear to auscultation  Pulmonary exam normal       Cardiovascular hypertension, Pt. on medications negative cardio ROS  Rhythm:Regular Rate:Normal  Normal nuclear stress test Jan 2012 No ischemia. EF 81%    Neuro/Psych PSYCHIATRIC DISORDERS Anxiety Depression negative neurological ROS  negative psych ROS   GI/Hepatic negative GI ROS, Neg liver ROS,   Endo/Other  negative endocrine ROSDiabetes mellitus-, Oral Hypoglycemic AgentsHypothyroidism   Renal/GU negative Renal ROS  negative genitourinary   Musculoskeletal negative musculoskeletal ROS (+)   Abdominal   Peds negative pediatric ROS (+)  Hematology negative hematology ROS (+)   Anesthesia Other Findings   Reproductive/Obstetrics negative OB ROS                          Anesthesia Physical Anesthesia Plan  ASA: II  Anesthesia Plan: Spinal   Post-op Pain Management:    Induction: Intravenous  Airway Management Planned:   Additional Equipment:   Intra-op Plan:   Post-operative Plan:   Informed Consent: I have reviewed the patients History and Physical, chart, labs and discussed the procedure including the risks, benefits and alternatives for the proposed anesthesia with the patient or authorized representative who has indicated his/her understanding and acceptance.   Dental advisory given  Plan Discussed with: CRNA  Anesthesia Plan Comments:         Anesthesia Quick Evaluation

## 2012-04-22 LAB — GLUCOSE, CAPILLARY
Glucose-Capillary: 103 mg/dL — ABNORMAL HIGH (ref 70–99)
Glucose-Capillary: 150 mg/dL — ABNORMAL HIGH (ref 70–99)

## 2012-04-22 LAB — CBC
HCT: 29 % — ABNORMAL LOW (ref 36.0–46.0)
Hemoglobin: 9.5 g/dL — ABNORMAL LOW (ref 12.0–15.0)
MCV: 95.7 fL (ref 78.0–100.0)
WBC: 9.6 10*3/uL (ref 4.0–10.5)

## 2012-04-22 LAB — BASIC METABOLIC PANEL
BUN: 9 mg/dL (ref 6–23)
Chloride: 104 mEq/L (ref 96–112)
Creatinine, Ser: 0.51 mg/dL (ref 0.50–1.10)
Glucose, Bld: 111 mg/dL — ABNORMAL HIGH (ref 70–99)
Potassium: 4 mEq/L (ref 3.5–5.1)

## 2012-04-22 NOTE — Progress Notes (Signed)
Subjective: 1 Day Post-Op Procedure(s) (LRB): TOTAL KNEE ARTHROPLASTY (Right) Patient reports pain as moderate.   Patient is well, and has had no acute complaints or problems We will start therapy today.  Plan is to go Skilled nursing facility after hospital stay. Currently sitting up at bedside  Objective: Vital signs in last 24 hours: Temp:  [97.1 F (36.2 C)-98 F (36.7 C)] 97.9 F (36.6 C) (06/08 0640) Pulse Rate:  [71-83] 75  (06/08 0640) Resp:  [12-25] 16  (06/08 0640) BP: (106-135)/(54-88) 134/88 mmHg (06/08 0640) SpO2:  [95 %-100 %] 99 % (06/08 0640) Weight:  [94.348 kg (208 lb)] 94.348 kg (208 lb) (06/07 1850)  Intake/Output from previous day:  Intake/Output Summary (Last 24 hours) at 04/22/12 0800 Last data filed at 04/22/12 0639  Gross per 24 hour  Intake   2975 ml  Output   2290 ml  Net    685 ml    Intake/Output this shift:    Labs:  Basename 04/22/12 0520  HGB 9.5*    Basename 04/22/12 0520  WBC 9.6  RBC 3.03*  HCT 29.0*  PLT 222    Basename 04/22/12 0520  NA 136  K 4.0  CL 104  CO2 23  BUN 9  CREATININE 0.51  GLUCOSE 111*  CALCIUM 7.9*   No results found for this basename: LABPT:2,INR:2 in the last 72 hours  EXAM General - Patient is Alert, Appropriate and Oriented Extremity - Neurologically intact Dorsiflexion/Plantar flexion intact Compartment soft Dressing - dressing C/D/I Motor Function - intact, moving foot and toes well on exam.  Hemovac pulled without difficulty.  Past Medical History  Diagnosis Date  . Hypertension   . Hypercholesterolemia   . Obesity   . Normal nuclear stress test Jan 2012    No ischemia. EF 81%  . Sleep apnea     DOES NOT USE CPAP-UNABLE TO TOLERATE  . Anxiety   . Depression   . Diabetes mellitus     ORAL MED  . Osteopenia   . Shortness of breath     WITH EXERTION  . Hypothyroidism     STATES SHE NO LONGER NEEDS THYROID SUPPLEMENT  . Osteoarthritis (arthritis due to wear and tear of joints)      PAIN AND OA BOTH KNEES  . Urinary frequency     Assessment/Plan: 1 Day Post-Op Procedure(s) (LRB): TOTAL KNEE ARTHROPLASTY (Right) Principal Problem:  *OA (osteoarthritis) of knee   Advance diet Up with therapy Discharge to SNF on 6/10  DVT Prophylaxis - Xarelto Weight-Bearing as tolerated to right leg Keep foley until tomorrow. No vaccines. D/C PCA, Change to IV push D/C O2 and Pulse OX and try on Room Air  Adrienne [name] V 04/22/2012, 8:00 AM

## 2012-04-22 NOTE — Progress Notes (Signed)
OT Note Order received, chart reviewed. Pt currently needing +2 A for mobility and is planning d/c to snf. Will sign off and defer OT eval to that venue.  Garrel Ridgel, OTR/L  Pager [phone] 04/22/2012

## 2012-04-22 NOTE — Progress Notes (Signed)
Physical Therapy Treatment Patient Details Name: EMIKO OSORTO MRN: [id number] DOB: 10-27-1936 Today's Date: 04/22/2012 Time: 4098-1191 PT Time Calculation (min): 16 min  PT Assessment / Plan / Recommendation Comments on Treatment Session  Pt with increased knee pain and ankle pain this pm. Was able to participate with ther ex for right knee strengthening. Noted pt with right leg (knee to toes) swelling with elastic band of sock constricting around ankle. Both socks removed and RN notitied. Performed  gentle retrograde massage to assist with decreased toe/foot/ankle edema.                        Follow Up Recommendations  Skilled nursing facility       Equipment Recommendations  Defer to next venue    Recommendations for Other Services OT consult  Frequency 7X/week   Plan Discharge plan remains appropriate    Precautions / Restrictions Precautions Precautions: Knee Required Braces or Orthoses: Knee Immobilizer - Right Knee Immobilizer - Right: Discontinue once straight leg raise with < 10 degree lag;On except when in CPM Restrictions RLE Weight Bearing: Weight bearing as tolerated    Pertinent Vitals/Pain 9-8/10 pain reported for both the right knee and the left ankle. Pt's RN notified of pain levels and request for pain meds.       Exercises Total Joint Exercises Ankle Circles/Pumps: AROM;Both;10 reps;Supine Quad Sets: AROM;Strengthening;Right;10 reps;Supine Heel Slides: AAROM;Strengthening;Right;10 reps;Supine Hip ABduction/ADduction: AAROM;Strengthening;Right;10 reps;Supine Straight Leg Raises: AAROM;Strengthening;Right;10 reps;Supine     PT Goals  Pt was able to participate in right knee strengthening with there ex to progress towards mobility goals.  Visit Information  Last PT Received On: 04/22/12 Assistance Needed: +2    Subjective Data  Subjective: Complaints of left knee and right ankle pain, 8-9/10 both locations. Agreeable to ther ex in bed.    Cognition  Overall Cognitive Status: Appears within functional limits for tasks assessed/performed Arousal/Alertness: Awake/alert Orientation Level: Appears intact for tasks assessed Behavior During Session: [facility] for tasks performed Cognition - Other Comments: Demo'd decreased memory (short-term) by repeating several comments or questions that had already been stated/asked.       End of Session PT - End of Session Equipment Utilized During Treatment: Right knee immobilizer Activity Tolerance: Patient limited by pain Patient left: in bed;with call bell/phone within reach;with family/visitor present Nurse Communication: Patient requests pain meds    Sallyanne Kuster 04/22/2012, 3:51 PM  Sallyanne Kuster, PTA Office- [phone]

## 2012-04-22 NOTE — Evaluation (Signed)
Physical Therapy Evaluation Patient Details Name: Adrienne [name] MRN: [id number] DOB: July 06, 1936 Today's Date: 04/22/2012 Time: 4098-1191 PT Time Calculation (min): 29 min  PT Assessment / Plan / Recommendation Clinical Impression  Pt presents s/p R TKA POD 1 with decreased strength, ROM and mobility in RLE.  Pt also with increased c/o pain in L ankle from previous inury years ago.  Tolerated some steps from bed to chair, however unable to ambulate to hallway due to increased pain.  Pt will benefit from skilled PT in acute venue to address deficits.  PT recommends SNF for follow up therapy in order to increase pt safety.      PT Assessment  Patient needs continued PT services    Follow Up Recommendations  Skilled nursing facility    Barriers to Discharge Decreased caregiver support      lEquipment Recommendations  Defer to next venue    Recommendations for Other Services OT consult   Frequency 7X/week    Precautions / Restrictions Precautions Precautions: Knee Required Braces or Orthoses: Knee Immobilizer - Right Knee Immobilizer - Right: Discontinue once straight leg raise with < 10 degree lag Restrictions Weight Bearing Restrictions: No RLE Weight Bearing: Weight bearing as tolerated   Pertinent Vitals/Pain 8/10      Mobility  Bed Mobility Bed Mobility: Supine to Sit Supine to Sit: 4: Min assist;HOB elevated Details for Bed Mobility Assistance: Min assist for RLE off of bed with multi modal cuing for hand placement on bed to self assist with trunk.   Transfers Transfers: Sit to Stand;Stand to Sit Sit to Stand: 3: Mod assist;2: Max assist;From elevated surface;With upper extremity assist;From bed Stand to Sit: 3: Mod assist;With upper extremity assist;With armrests;To chair/3-in-1 Details for Transfer Assistance: assist to rise and for controlled descent with max cuing for hand placement and LE management when sitting/standing.   Ambulation/Gait Ambulation/Gait  Assistance: 3: Mod assist Ambulation Distance (Feet): 8 Feet Assistive device: Rolling walker Ambulation/Gait Assistance Details: Assist for maintaining upright stance and weight shifting with cues for sequencing/technique with RW and for upright posture.  Gait Pattern: Step-to pattern;Decreased stance time - right;Decreased step length - left;Trunk flexed;Decreased stride length Gait velocity: decreased Stairs: No Wheelchair Mobility Wheelchair Mobility: No    Exercises     PT Diagnosis: Difficulty walking;Abnormality of gait;Generalized weakness;Acute pain  PT Problem List: Decreased strength;Decreased range of motion;Decreased activity tolerance;Decreased mobility;Decreased knowledge of use of DME;Pain PT Treatment Interventions: DME instruction;Gait training;Functional mobility training;Therapeutic exercise;Therapeutic activities;Balance training;Patient/family education   PT Goals Acute Rehab PT Goals PT Goal Formulation: With patient Time For Goal Achievement: 04/26/12 Potential to Achieve Goals: Fair Pt will go Supine/Side to Sit: with supervision PT Goal: Supine/Side to Sit - Progress: Goal set today Pt will go Sit to Supine/Side: with supervision PT Goal: Sit to Supine/Side - Progress: Goal set today Pt will go Sit to Stand: with supervision PT Goal: Sit to Stand - Progress: Goal set today Pt will go Stand to Sit: with supervision PT Goal: Stand to Sit - Progress: Goal set today Pt will Ambulate: 51 - 150 feet;with least restrictive assistive device;with supervision PT Goal: Ambulate - Progress: Goal set today  Visit Information  Last PT Received On: 04/22/12 Assistance Needed: +2    Subjective Data  Subjective: My knee and my other ankle hurts Patient Stated Goal: to go to rehab   Prior Functioning  Home Living Lives With: Alone Available Help at Discharge: Skilled Nursing Facility Type of Home: House Home Access: Stairs to  enter Entrance Stairs-Number of  Steps: 5 Entrance Stairs-Rails: Right;Left Home Layout: One level Home Adaptive Equipment: Straight cane;Walker - rolling Prior Function Level of Independence: Independent with assistive device(s) Able to Take Stairs?: Yes Driving: Yes Vocation: Retired Musician: No difficulties    Cognition  Overall Cognitive Status: Appears within functional limits for tasks assessed/performed Arousal/Alertness: Awake/alert Orientation Level: Appears intact for tasks assessed Behavior During Session: [facility] for tasks performed Cognition - Other Comments: Noted some short term memory issues during session, with pt repeating several questions or comments already stated or asked.      Extremity/Trunk Assessment Right Lower Extremity Assessment RLE ROM/Strength/Tone: Deficits RLE ROM/Strength/Tone Deficits: Ankle motions WFL, unable to perform SLR without assist and increased pain.  RLE Sensation: WFL - Light Touch RLE Coordination: WFL - gross motor Left Lower Extremity Assessment LLE ROM/Strength/Tone: WFL for tasks assessed LLE Sensation: WFL - Light Touch LLE Coordination: WFL - gross motor Trunk Assessment Trunk Assessment: Kyphotic   Balance    End of Session PT - End of Session Equipment Utilized During Treatment: Gait belt;Right knee immobilizer Activity Tolerance: Patient limited by pain Patient left: in chair;with call bell/phone within reach Nurse Communication: Mobility status CPM Right Knee CPM Right Knee: Off   Page, [name] 04/22/2012, 11:44 AM

## 2012-04-23 ENCOUNTER — Inpatient Hospital Stay (HOSPITAL_COMMUNITY): Payer: Medicare Other

## 2012-04-23 LAB — CBC
MCH: 31.5 pg (ref 26.0–34.0)
MCHC: 32.5 g/dL (ref 30.0–36.0)
MCV: 96.7 fL (ref 78.0–100.0)
Platelets: 214 10*3/uL (ref 150–400)
RDW: 13.3 % (ref 11.5–15.5)
WBC: 11.3 10*3/uL — ABNORMAL HIGH (ref 4.0–10.5)

## 2012-04-23 LAB — GLUCOSE, CAPILLARY
Glucose-Capillary: 138 mg/dL — ABNORMAL HIGH (ref 70–99)
Glucose-Capillary: 124 mg/dL — ABNORMAL HIGH (ref 70–99)

## 2012-04-23 LAB — BASIC METABOLIC PANEL
Glucose, Bld: 147 mg/dL — ABNORMAL HIGH (ref 70–99)
Potassium: 3.7 mEq/L (ref 3.5–5.1)

## 2012-04-23 NOTE — Progress Notes (Signed)
Physical Therapy Treatment Patient Details Name: Adrienne [name] MRN: [id number] DOB: January 05, 1936 Today's Date: 04/23/2012 Time: 4098-1191 PT Time Calculation (min): 15 min  PT Assessment / Plan / Recommendation Comments on Treatment Session  Pt continues to have increased pain in left ankle.  Spoke with pts daughter who would like to have an xray done of pts ankle to ensure there is no injury, aside from previous injury 8 years ago.     Follow Up Recommendations  Skilled nursing facility    Barriers to Discharge        Equipment Recommendations  Defer to next venue    Recommendations for Other Services    Frequency 7X/week   Plan Discharge plan remains appropriate    Precautions / Restrictions Precautions Precautions: Knee Required Braces or Orthoses: Knee Immobilizer - Right Knee Immobilizer - Right: Discontinue once straight leg raise with < 10 degree lag;On except when in CPM Restrictions Weight Bearing Restrictions: No RLE Weight Bearing: Weight bearing as tolerated   Pertinent Vitals/Pain 7-8/10    Mobility  Bed Mobility Bed Mobility: Sit to Supine Sit to Supine: 4: Min assist Details for Bed Mobility Assistance: Assist for RLE into bed with cues for UE placement to self assist trunk and adjust in bed.   Transfers Transfers: Sit to Stand;Stand to Sit Sit to Stand: 4: Min assist;3: Mod assist;From elevated surface;With upper extremity assist;With armrests;From chair/3-in-1 Stand to Sit: 4: Min assist;To elevated surface;To bed Details for Transfer Assistance: Continue to provide cues for hand placement and LE management when sitting/standing.  Ambulation/Gait Ambulation/Gait Assistance: 4: Min assist;3: Mod assist Ambulation Distance (Feet): 8 Feet Assistive device: Rolling walker Ambulation/Gait Assistance Details: Multi modal cuing for sequencing/technique with RW, esp when performing turns.  Pt demos increased anxiety with progress, however is limited due to  increased pain.  Gait Pattern: Step-to pattern;Decreased stance time - right;Decreased step length - left;Trunk flexed;Decreased stride length Gait velocity: decreased    Exercises     PT Diagnosis:    PT Problem List:   PT Treatment Interventions:     PT Goals Acute Rehab PT Goals PT Goal Formulation: With patient Time For Goal Achievement: 04/26/12 Potential to Achieve Goals: Fair Pt will go Sit to Supine/Side: with supervision PT Goal: Sit to Supine/Side - Progress: Progressing toward goal Pt will go Sit to Stand: with supervision PT Goal: Sit to Stand - Progress: Progressing toward goal Pt will go Stand to Sit: with supervision PT Goal: Stand to Sit - Progress: Progressing toward goal Pt will Ambulate: 51 - 150 feet;with least restrictive assistive device;with supervision PT Goal: Ambulate - Progress: Not progressing (due to pain)  Visit Information  Last PT Received On: 04/23/12 Assistance Needed: +2 (for safety)    Subjective Data  Subjective: My ankle hurts so bad.  Patient Stated Goal: to go to rehab   Cognition  Overall Cognitive Status: Difficult to assess Difficult to assess due to:  (Difficulty processing/carrying out verbal commands. ) Arousal/Alertness: Awake/alert Orientation Level: Appears intact for tasks assessed Behavior During Session: [name] for tasks performed Cognition - Other Comments: Continues to demo short term memory issues during session.     Balance     End of Session PT - End of Session Equipment Utilized During Treatment: Right knee immobilizer Activity Tolerance: Patient limited by pain Patient left: in bed;with call bell/phone within reach Nurse Communication: Mobility status    Page, Adrienne [name] 04/23/2012, 4:07 PM

## 2012-04-23 NOTE — Progress Notes (Signed)
Physical Therapy Treatment Patient Details Name: Adrienne [name] MRN: [id number] DOB: 1935-11-25 Today's Date: 04/23/2012 Time: 4098-1191 PT Time Calculation (min): 37 min  PT Assessment / Plan / Recommendation Comments on Treatment Session  Pt continues to have increased pain in right knee and left ankle.  Also noted pitting edema in right leg.  Took off sock and donned TED hose, placed pillow under right leg and educated pt on continuously performing ankle pumps throughout the day to assist fluid movement.  RN notified and will monitor.  Pt also shared concerns about D/Cing tomorrow, however pt educated on role of acute and SNF rehab to continue strengthening pt for a safe return home.     Follow Up Recommendations  Skilled nursing facility    Barriers to Discharge        Equipment Recommendations  Defer to next venue    Recommendations for Other Services OT consult  Frequency 7X/week   Plan Discharge plan remains appropriate    Precautions / Restrictions Precautions Precautions: Knee Required Braces or Orthoses: Knee Immobilizer - Right Knee Immobilizer - Right: Discontinue once straight leg raise with < 10 degree lag;On except when in CPM Restrictions Weight Bearing Restrictions: No RLE Weight Bearing: Weight bearing as tolerated   Pertinent Vitals/Pain Pain in right knee and left ankle.  Continues to have swelling in right leg.  Placed TED hose.  RN aware    Mobility  Bed Mobility Bed Mobility: Not assessed (Pt already in recliner when PT arrived. ) Transfers Transfers: Sit to Stand;Stand to Sit Sit to Stand: 3: Mod assist;With upper extremity assist;With armrests;From chair/3-in-1 Stand to Sit: 4: Min assist;With upper extremity assist;With armrests;To chair/3-in-1 Details for Transfer Assistance: Performed transfers x 2 in order to use 3in1.  Max cuing for hand placement and L leg placement to assist pt to standing.  Also provided cuing for glute activation for upright  stance.   Ambulation/Gait Ambulation/Gait Assistance: 4: Min assist;3: Mod assist Ambulation Distance (Feet): 10 Feet (then another 12') Assistive device: Rolling walker Ambulation/Gait Assistance Details: Assist for weight shifting and slight assist for advancing LLE.  Cues for sequencing/technique and upright posture.  Gait Pattern: Step-to pattern;Decreased stance time - right;Decreased step length - left;Trunk flexed;Decreased stride length Gait velocity: decreased Stairs: No Wheelchair Mobility Wheelchair Mobility: No    Exercises     PT Diagnosis:    PT Problem List:   PT Treatment Interventions:     PT Goals Acute Rehab PT Goals PT Goal Formulation: With patient Time For Goal Achievement: 04/26/12 Potential to Achieve Goals: Fair Pt will go Sit to Stand: with supervision PT Goal: Sit to Stand - Progress: Progressing toward goal Pt will go Stand to Sit: with supervision PT Goal: Stand to Sit - Progress: Progressing toward goal Pt will Ambulate: 51 - 150 feet;with least restrictive assistive device;with supervision PT Goal: Ambulate - Progress: Progressing toward goal  Visit Information  Last PT Received On: 04/23/12 Assistance Needed: +2 (for safety)    Subjective Data  Subjective: Continues to have left ankle pain that is worse than R knee pain.  "I want to stay here another day." Patient Stated Goal: to go to rehab   Cognition  Overall Cognitive Status: Difficult to assess Difficult to assess due to: Other (comment) (Continues to have difficulty processing verbal commands,) Arousal/Alertness: Awake/alert Orientation Level: Appears intact for tasks assessed Behavior During Session: [facility] for tasks performed Cognition - Other Comments: Continues to demo short term memory issues during session.  Balance     End of Session PT - End of Session Equipment Utilized During Treatment: Right knee immobilizer Activity Tolerance: Patient limited by pain Patient left: in  chair;with call bell/phone within reach Nurse Communication: Mobility status CPM Right Knee CPM Right Knee: Off    Page, [name] 04/23/2012, 11:00 AM

## 2012-04-23 NOTE — Progress Notes (Signed)
Subjective: 2 Days Post-Op Procedure(s) (LRB): TOTAL KNEE ARTHROPLASTY (Right) Patient reports pain as moderate.   Patient seen in rounds with Dr. Darrelyn Hillock. Patient is well, but has had some minor complaints of pain in her left ankle. She reports that she fractured that ankle 7 years ago and she has had some pain in it when ambulating. She reports that she did not walk much with therapy yesterday. She denies chest pain and shortness of breath. Plan is to go Skilled nursing facility after hospital stay.  Objective: Vital signs in last 24 hours: Temp:  [98.2 F (36.8 C)-99.9 F (37.7 C)] 98.2 F (36.8 C) (06/09 0600) Pulse Rate:  [85-94] 94  (06/08 2144) Resp:  [16-20] 18  (06/09 0800) BP: (132-171)/(73-81) 171/81 mmHg (06/09 0600) SpO2:  [93 %-98 %] 95 % (06/09 0800)  Intake/Output from previous day:  Intake/Output Summary (Last 24 hours) at 04/23/12 0810 Last data filed at 04/23/12 0757  Gross per 24 hour  Intake 3989.19 ml  Output   1400 ml  Net 2589.19 ml    Intake/Output this shift: Total I/O In: -  Out: 100 [Urine:100]  Labs:  [facility] 04/22/12 0520  HGB 9.5*    Basename 04/22/12 0520  WBC 9.6  RBC 3.03*  HCT 29.0*  PLT 222    Basename 04/22/12 0520  NA 136  K 4.0  CL 104  CO2 23  BUN 9  CREATININE 0.51  GLUCOSE 111*  CALCIUM 7.9*    EXAM General - Patient is Alert and Oriented Extremity - Neurologically intact Neurovascular intact Dorsiflexion/Plantar flexion intact Dressing/Incision - clean, dry, no drainage. Dressing changed Motor Function - intact, moving foot and toes well on exam.   Past Medical History  Diagnosis Date  . Hypertension   . Hypercholesterolemia   . Obesity   . Normal nuclear stress test Jan 2012    No ischemia. EF 81%  . Sleep apnea     DOES NOT USE CPAP-UNABLE TO TOLERATE  . Anxiety   . Depression   . Diabetes mellitus     ORAL MED  . Osteopenia   . Shortness of breath     WITH EXERTION  . Hypothyroidism    STATES SHE NO LONGER NEEDS THYROID SUPPLEMENT  . Osteoarthritis (arthritis due to wear and tear of joints)     PAIN AND OA BOTH KNEES  . Urinary frequency     Assessment/Plan: 2 Days Post-Op Procedure(s) (LRB): TOTAL KNEE ARTHROPLASTY (Right) Principal Problem:  *OA (osteoarthritis) of knee   Advance diet Up with therapy D/C IV fluids when diet tolerated Discharge to SNF tomorrow  DVT Prophylaxis - Xarelto and TED hose Weight-Bearing as tolerated to right leg  Tacuma Graffam LAUREN 04/23/2012, 8:10 AM

## 2012-04-24 ENCOUNTER — Encounter (HOSPITAL_COMMUNITY): Payer: Self-pay | Admitting: Orthopedic Surgery

## 2012-04-24 LAB — GLUCOSE, CAPILLARY
Glucose-Capillary: 133 mg/dL — ABNORMAL HIGH (ref 70–99)
Glucose-Capillary: 164 mg/dL — ABNORMAL HIGH (ref 70–99)

## 2012-04-24 LAB — CBC
HCT: 27.2 % — ABNORMAL LOW (ref 36.0–46.0)
Hemoglobin: 8.7 g/dL — ABNORMAL LOW (ref 12.0–15.0)
RBC: 2.83 MIL/uL — ABNORMAL LOW (ref 3.87–5.11)
WBC: 10.2 10*3/uL (ref 4.0–10.5)

## 2012-04-24 MED ORDER — ONDANSETRON HCL 4 MG PO TABS: 4.0000 mg | ORAL_TABLET | Freq: Four times a day (QID) | ORAL | Status: AC | PRN

## 2012-04-24 MED ORDER — DSS 100 MG PO CAPS: 100.0000 mg | ORAL_CAPSULE | Freq: Two times a day (BID) | ORAL | Status: AC

## 2012-04-24 MED ORDER — METHOCARBAMOL 500 MG PO TABS: 500.0000 mg | ORAL_TABLET | Freq: Four times a day (QID) | ORAL | Status: AC | PRN

## 2012-04-24 MED ORDER — BISACODYL 10 MG RE SUPP: 10.0000 mg | Freq: Every day | RECTAL | Status: AC | PRN

## 2012-04-24 MED ORDER — RIVAROXABAN 10 MG PO TABS: 10.0000 mg | ORAL_TABLET | Freq: Every day | ORAL | Status: DC

## 2012-04-24 MED ORDER — OXYCODONE HCL 5 MG PO TABS: 5.0000 mg | ORAL_TABLET | ORAL | Status: AC | PRN

## 2012-04-24 MED ORDER — POLYETHYLENE GLYCOL 3350 17 G PO PACK
17.0000 g | PACK | Freq: Every day | ORAL | Status: AC | PRN
Start: 2012-04-24 — End: 2012-04-27

## 2012-04-24 NOTE — Care Management Note (Signed)
    Page 1 of 2   04/24/2012     6:40:34 PM   CARE MANAGEMENT NOTE 04/24/2012  Patient:  Adrienne [name]   Account Number:  [id number]  Date Initiated:  04/24/2012  Documentation initiated by:  Colleen Can  Subjective/Objective Assessment:   DX TOTAL KNEE REPLACEMNT -RIGHT     Action/Plan:   SNF REHAB   Anticipated DC Date:  04/24/2012   Anticipated DC Plan:  SKILLED NURSING FACILITY  In-house referral  Clinical Social Worker      DC Planning Services  CM consult      [facility] Choice  NA   Choice offered to / List presented to:  NA   DME arranged  NA      DME agency  NA     HH arranged  NA      HH agency  NA   Status of service:  Completed, signed off Medicare Important Message given?  NA - LOS <3 / Initial given by admissions (If response is "NO", the following Medicare IM given date fields will be blank) Date Medicare IM given:   Date Additional Medicare IM given:    Discharge Disposition:  SKILLED NURSING FACILITY  Per UR Regulation:    If discussed at Long Length of Stay Meetings, dates discussed:    Comments:

## 2012-04-24 NOTE — Progress Notes (Signed)
D/c to snf.  Pt alert and stable. Pt iv site d/c this am.  Pressure dressing was applied to site.  Pt leaving via ambulance transport now

## 2012-04-24 NOTE — Progress Notes (Signed)
Physical Therapy Treatment Patient Details Name: GIRL SCHISSLER MRN: [id number] DOB: [birth year] Today's Date: 04/24/2012 Time: 1110-1140 PT Time Calculation (min): 30 min  PT Assessment / Plan / Recommendation Comments on Treatment Session  L ankle X ray neg for acute findings.  Pt given ASO for comfort/support.  Applied ASO and instructed on use for amb and to wear during the day, then remove at night. Pt progressing slowly and plans to D/C to [facility] for ST Rehab.    Follow Up Recommendations  Skilled nursing facility    Barriers to Discharge        Equipment Recommendations  Defer to next venue    Recommendations for Other Services    Frequency 7X/week   Plan Discharge plan remains appropriate    Precautions / Restrictions WBAT B LE KI R LE until 10 active SLR L ankle ASO for amb until f/u MD appt   Pertinent Vitals/Pain C/o 8/10 R knee pain C/o 7/10 L ankle pain    Mobility  Bed Mobility Bed Mobility: Supine to Sit Supine to Sit: 1: +2 Total assist Supine to Sit: Patient Percentage: 50% Details for Bed Mobility Assistance: Max assist to support R LE Transfers Transfers: Sit to Stand;Stand to Sit Sit to Stand: 1: +2 Total assist;From bed Sit to Stand: Patient Percentage: 60% Stand to Sit: 1: +2 Total assist;To chair/3-in-1 Stand to Sit: Patient Percentage: 70% Details for Transfer Assistance: 50% VC's on proper tech and hand placement Ambulation/Gait Ambulation/Gait Assistance: 1: +2 Total assist Ambulation/Gait: Patient Percentage: 80% Ambulation Distance (Feet): 18 Feet Assistive device: Rolling walker Ambulation/Gait Assistance Details: with KI and increased time.  %0% VC's on sequencing and proper walker to self distance. Gait Pattern: Step-to pattern;Decreased stance time - right;Decreased stride length;Trunk flexed Gait velocity: slow Stairs: No Wheelchair Mobility Wheelchair Mobility: No       PT Goals             progressing    Visit  Information  Last PT Received On: 04/24/12 Assistance Needed: +2                   End of Session PT - End of Session Equipment Utilized During Treatment: Gait belt;Right knee immobilizer;Other (comment) (L ASO) Activity Tolerance: Patient limited by fatigue;Patient limited by pain Patient left: in chair;with call bell/phone within reach;with family/visitor present    Felecia Shelling  PTA [facility]  Acute  Rehab Pager     [phone]

## 2012-04-24 NOTE — Discharge Instructions (Signed)
 Dr. Frank Aluisio Total Joint Specialist [facility] [address] [place], [place] [address] [phone]  TOTAL KNEE REPLACEMENT POSTOPERATIVE DIRECTIONS    Knee Rehabilitation, Guidelines Following Surgery  Results after knee surgery are often greatly improved when you follow the exercise, range of motion and muscle strengthening exercises prescribed by your doctor. Safety measures are also important to protect the knee from further injury. Any time any of these exercises cause you to have increased pain or swelling in your knee joint, decrease the amount until you are comfortable again and slowly increase them. If you have problems or questions, call your caregiver or physical therapist for advice.   HOME CARE INSTRUCTIONS  Remove items at home which could result in a fall. This includes throw rugs or furniture in walking pathways.  Continue medications as instructed at time of discharge. You may have some home medications which will be placed on hold until you complete the course of blood thinner medication.  You may start showering once you are discharged home but do not submerge the incision under water. Just pat the incision dry and apply a dry gauze dressing on daily. Walk with walker as instructed.   Use walker as long as suggested by your caregivers.  Avoid periods of inactivity such as sitting longer than an hour when not asleep. This helps prevent blood clots.  You may put full weight on your legs and walk as much as is comfortable.  You may resume a sexual relationship in one month or when given the OK by your doctor.  You may return to work once you are cleared by your doctor.  Do not drive a car for 6 weeks or until released by you surgeon.   Do not drive while taking narcotics.  Wear the elastic stockings for three weeks following surgery during the day but you may remove then at night. Make sure you keep all of your appointments after your  operation with all of your doctors and caregivers. You should call the office at the above phone number and make an appointment for approximately two weeks after the date of your surgery. Change the dressing daily and reapply a dry dressing each time. Please pick up a stool softener and laxative for home use as long as you are requiring pain medications.  Continue to use ice on the knee for pain and swelling from surgery. You may notice swelling that will progress down to the foot and ankle.  This is normal after surgery.  Elevate the leg when you are not up walking on it.   It is important for you to complete the blood thinner medication as prescribed by your doctor.  Continue to use the breathing machine which will help keep your temperature down.  It is common for your temperature to cycle up and down following surgery, especially at night when you are not up moving around and exerting yourself.  The breathing machine keeps your lungs expanded and your temperature down.  RANGE OF MOTION AND STRENGTHENING EXERCISES  Rehabilitation of the knee is important following a knee injury or an operation. After just a few days of immobilization, the muscles of the thigh which control the knee become weakened and shrink (atrophy). Knee exercises are designed to build up the tone and strength of the thigh muscles and to improve knee motion. Often times heat used for twenty to thirty minutes before working out will loosen up your tissues and help with improving the range   of motion but do not use heat for the first two weeks following surgery. These exercises can be done on a training (exercise) mat, on the floor, on a table or on a bed. Use what ever works the best and is most comfortable for you Knee exercises include:  Leg Lifts - While your knee is still immobilized in a splint or cast, you can do straight leg raises. Lift the leg to 60 degrees, hold for 3 sec, and slowly lower the leg. Repeat 10-20 times 2-3  times daily. Perform this exercise against resistance later as your knee gets better.  Quad and Hamstring Sets - Tighten up the muscle on the front of the thigh (Quad) and hold for 5-10 sec. Repeat this 10-20 times hourly. Hamstring sets are done by pushing the foot backward against an object and holding for 5-10 sec. Repeat as with quad sets.  A rehabilitation program following serious knee injuries can speed recovery and prevent re-injury in the future due to weakened muscles. Contact your doctor or a physical therapist for more information on knee rehabilitation.   SKILLED REHAB INSTRUCTIONS: If the patient is transferred to a skilled rehab facility following release from the hospital, a list of the current medications will be sent to the facility for the patient to continue.  When discharged from the skilled rehab facility, please have the facility set up the patient's Home Health Physical Therapy prior to being released. Also, the skilled facility will be responsible for providing the patient with their medications at time of release from the facility to include their pain medication, the muscle relaxants, and their blood thinner medication. If the patient is still at the rehab facility at time of the two week follow up appointment, the skilled rehab facility will also need to assist the patient in arranging follow up appointment in our office and any transportation needs.  MAKE SURE YOU:  Understand these instructions.  Will watch your condition.  Will get help right away if you are not doing well or get worse.  Document Released: 11/01/2005 Document Revised: 10/21/2011 Document Reviewed: 04/21/2007  [facility] Patient Information 2012 [place], [place].   Take Xarelto for two and a half more weeks, then discontinue Xarelto. Once the patient has completed the Xarelto, they may resume the 81 mg Aspirin.  When discharged from the skilled rehab facility, please have the facility set up the patient's  Home Health Physical Therapy prior to being released.  Also provide the patient with their medications at time of release from the facility to include their pain medication, the muscle relaxants, and their blood thinner medication.  If the patient is still at the rehab facility at time of follow up appointment, please also assist the patient in arranging follow up appointment in our office and any transportation needs.

## 2012-04-24 NOTE — Progress Notes (Signed)
Patient cleared for discharged. Patient pre registered at [facility]. Packet copied and placed in [place]. ptar called for transportation. Patient and family at bedside informed and agreeable.  Wane Mollett C. Hugh Garrow MSW, LCSW [phone]

## 2012-04-24 NOTE — Discharge Summary (Signed)
Physician Discharge Summary   Patient ID: Adrienne [name] MRN: [id number] DOB/AGE: Jun 02, 1936 76 y.o.  Admit date: 04/21/2012 Discharge date: 04/24/2012  Primary Diagnosis: Osteoarthritis Right Knee   Admission Diagnoses:  Past Medical History  Diagnosis Date  . Hypertension   . Hypercholesterolemia   . Obesity   . Normal nuclear stress test Jan 2012    No ischemia. EF 81%  . Sleep apnea     DOES NOT USE CPAP-UNABLE TO TOLERATE  . Anxiety   . Depression   . Diabetes mellitus     ORAL MED  . Osteopenia   . Shortness of breath     WITH EXERTION  . Hypothyroidism     STATES SHE NO LONGER NEEDS THYROID SUPPLEMENT  . Osteoarthritis (arthritis due to wear and tear of joints)     PAIN AND OA BOTH KNEES  . Urinary frequency    Discharge Diagnoses:   Principal Problem:  *OA (osteoarthritis) of knee  Procedure:  Procedure(s) (LRB): TOTAL KNEE ARTHROPLASTY (Right)   Consults: None  HPI: Adrienne [name] is a 76 y.o. year old female with end stage OA of her right knee with progressively worsening pain and dysfunction. She has constant pain, with activity and at rest and significant functional deficits with difficulties even with ADLs. She has had extensive non-op management including analgesics, injections of cortisone and viscosupplements, and home exercise program, but remains in significant pain with significant dysfunction.Radiographs show bone on bone arthritis all 3 compartments with varus deformity and tibial subluxation. She presents now for left Total Knee Arthroplasty.     Laboratory Data: Hospital Outpatient Visit on 04/18/2012  Component Date Value Range Status  . MRSA, PCR  04/18/2012 NEGATIVE  NEGATIVE Final  . Staphylococcus aureus  04/18/2012 NEGATIVE  NEGATIVE Final   Comment:                                 The Xpert SA Assay (FDA                          approved for NASAL specimens                          only), is one component of      a comprehensive surveillance                          program.  It is not intended                          to diagnose infection nor to                          guide or monitor treatment.  . WBC (K/uL) 04/18/2012 8.4  4.0-10.5 Final  . RBC (MIL/uL) 04/18/2012 4.10  3.87-5.11 Final  . Hemoglobin (g/dL) 48/54/6270 35.0  09.3-81.8 Final  . HCT (%) 04/18/2012 39.8  36.0-46.0 Final  . MCV (fL) 04/18/2012 97.1  78.0-100.0 Final  . MCH (pg) 04/18/2012 31.2  26.0-34.0 Final  . MCHC (g/dL) 29/93/7169 67.8  93.8-10.1 Final  . RDW (%) 04/18/2012 13.2  11.5-15.5 Final  . Platelets (K/uL) 04/18/2012 274  150-400 Final  . Sodium (mEq/L) 04/18/2012 138  135-145 Final  . Potassium (mEq/L) 04/18/2012 3.5  3.5-5.1 Final  . Chloride (mEq/L) 04/18/2012 104  96-112 Final  . CO2 (mEq/L) 04/18/2012 23  19-32 Final  . Glucose, Bld (mg/dL) 40/98/1191 478* 29-56 Final  . BUN (mg/dL) 21/30/8657 20  8-46 Final  . Creatinine, Ser (mg/dL) 96/29/5284 1.32  4.40-1.02 Final  . Calcium (mg/dL) 72/53/6644 9.2  0.3-47.4 Final  . Total Protein (g/dL) 25/95/6387 6.9  5.6-4.3 Final  . Albumin (g/dL) 32/95/1884 3.8  1.6-6.0 Final  . AST (U/L) 04/18/2012 38* 0-37 Final  . ALT (U/L) 04/18/2012 23  0-35 Final  . Alkaline Phosphatase (U/L) 04/18/2012 30* 39-117 Final  . Total Bilirubin (mg/dL) 63/11/6008 0.3  9.3-2.3 Final  . GFR calc non Af Amer (mL/min) 04/18/2012 84* >90 Final  . GFR calc Af Amer (mL/min) 04/18/2012 >90  >90 Final   Comment:                                 The eGFR has been calculated                          using the CKD EPI equation.                          This calculation has not been                          validated in all clinical                          situations.                          eGFR's persistently                          <90 mL/min signify                          possible Chronic Kidney Disease.  Marland Kitchen Prothrombin Time (seconds) 04/18/2012 13.8  11.6-15.2 Final  . INR   04/18/2012 1.04  0.00-1.49 Final  . aPTT (seconds) 04/18/2012 32  24-37 Final  . Color, Urine  04/18/2012 YELLOW  YELLOW Final  . APPearance  04/18/2012 CLEAR  CLEAR Final  . Specific Gravity, Urine  04/18/2012 1.033* 1.005-1.030 Final  . pH  04/18/2012 5.5  5.0-8.0 Final  . Glucose, UA (mg/dL) 55/73/2202 NEGATIVE  NEGATIVE Final  . Hgb urine dipstick  04/18/2012 TRACE* NEGATIVE Final  . Bilirubin Urine  04/18/2012 NEGATIVE  NEGATIVE Final  . Ketones, ur (mg/dL) 54/27/0623 NEGATIVE  NEGATIVE Final  . Protein, ur (mg/dL) 76/28/3151 NEGATIVE  NEGATIVE Final  . Urobilinogen, UA (mg/dL) 76/16/0737 0.2  1.0-6.2 Final  . Nitrite  04/18/2012 NEGATIVE  NEGATIVE Final  . Leukocytes, UA  04/18/2012 SMALL* NEGATIVE Final  . ABO/RH(D)  04/18/2012 AB POS   Final  . Antibody Screen  04/18/2012 NEG   Final  . Sample Expiration  04/18/2012 04/24/2012   Final  . ABO/RH(D)  04/18/2012 AB POS   Final  . Squamous Epithelial / LPF  04/18/2012 RARE  RARE Final  . WBC, UA (WBC/hpf) 04/18/2012 0-2  <3 Final  . RBC / HPF (RBC/hpf) 04/18/2012 3-6  <3 Final  . Bacteria, UA  04/18/2012 FEW*  RARE Final  . Casts  04/18/2012 HYALINE CASTS* NEGATIVE Final    Basename 04/24/12 0428 04/23/12 0755 04/22/12 0520  HGB 8.7* 9.6* 9.5*    Basename 04/24/12 0428 04/23/12 0755  WBC 10.2 11.3*  RBC 2.83* 3.05*  HCT 27.2* 29.5*  PLT 214 214    Basename 04/23/12 0755 04/22/12 0520  NA 135 136  K 3.7 4.0  CL 100 104  CO2 24 23  BUN 7 9  CREATININE 0.53 0.51  GLUCOSE 147* 111*  CALCIUM 8.4 7.9*   No results found for this basename: LABPT:2,INR:2 in the last 72 hours  X-Rays:Dg Chest 2 View  04/18/2012  *RADIOLOGY REPORT*  Clinical Data: Preoperative radiograph.  Right knee arthroplasty.  CHEST - 2 VIEW  Comparison: 12/05/2006.  Findings: Prominent calcification of the first costochondral junction noted bilaterally, similar to prior.  The bronchitic changes are present at the right lung base.  Cardiopericardial  silhouette within normal limits.  No airspace disease.  No effusion.  Thoracic spine DISH.  IMPRESSION: Stable appearance of the chest.  No active cardiopulmonary disease.  Original Report Authenticated By: Andreas Newport, M.D.   Dg Ankle Left Port  04/24/2012  *RADIOLOGY REPORT*  Clinical Data: Pain with remote history of fracture.  No recent trauma.  PORTABLE LEFT ANKLE - 2 VIEW  Comparison: Report of ankle films of 02/15/2011  Findings: Lateral plate and screw fixation of the distal fibula. No acute hardware complication.  The inferior most screw is not within the plate. Small calcaneal spur.  Tibiotalar osteoarthritis mild.  There is deformity of the mid distal tibial shaft which is presumably related to healed remote trauma.  Mild disuse osteopenia.  IMPRESSION:  1.  Lateral plate screw fixation of distal fibula.  A single screw is not within the fixation plate. 2.  Disuse osteopenia. 3.  Degenerative changes about the tibiotalar joint. 4.  Likely sequelae of remote fracture of the mid to distal tibial shaft.  Correlate with remote trauma to this area.  Original Report Authenticated By: Consuello Bossier, M.D.    EKG: Orders placed in visit on 02/08/12  . EKG 12-LEAD     Hospital Course: Patient was admitted to [facility] and taken to the OR and underwent the above state procedure without complications.  Patient tolerated the procedure well and was later transferred to the recovery room and then to the orthopaedic floor for postoperative care.  They were given PO and IV analgesics for pain control following their surgery.  They were given 24 hours of postoperative antibiotics and started on DVT prophylaxis in the form of Xarelto.   PT and OT were ordered for total joint protocol.  Discharge planning consulted to help with postop disposition and equipment needs.  Patient had a tough night on the evening of surgery but doing a little better the next day and started to get up OOB with therapy on  day one.  PCA was discontinued and they were weaned over to PO meds.  Hemovac drain was pulled without difficulty.  Continued to work with therapy into day two.  Dressing was changed on day two and the incision was healing well. She also complianed of left ankle pain, the side she had had a previous fracture.  Xrays were taken and showed: IMPRESSION:  1. Lateral plate screw fixation of distal fibula. A single screw  is not within the fixation plate.  2. Disuse osteopenia.  3. Degenerative changes about the tibiotalar joint.  4. Likely sequelae  of remote fracture of the mid to distal tibial  shaft. Correlate with remote trauma to this area. She was given an ASO to use for when ambulating. By day three, the patient still had pain when she was up.  Incision was healing well.  Patient was seen in rounds and was ready to the SNF as long as the bed was available.  Discharge Medications: Prior to Admission medications   Medication Sig Start Date End Date Taking? Authorizing Provider  acetaminophen (TYLENOL) 325 MG tablet Take 650 mg by mouth every 6 (six) hours as needed. Pain    Yes Historical Provider, MD  fenofibrate 160 MG tablet Take 160 mg by mouth every evening.    Yes Historical Provider, MD  Fesoterodine Fumarate (TOVIAZ) 8 MG TB24 Take 8 mg by mouth every evening.    Yes Historical Provider, MD  losartan-hydrochlorothiazide (HYZAAR) 100-12.5 MG per tablet Take 0.5 tablets by mouth daily before breakfast.  04/13/11  Yes Historical Provider, MD  metFORMIN (GLUCOPHAGE) 500 MG tablet Take 500 mg by mouth 2 (two) times daily with a meal.   Yes Historical Provider, MD  rosuvastatin (CRESTOR) 20 MG tablet Take 20 mg by mouth daily before breakfast.    Yes Historical Provider, MD  sertraline (ZOLOFT) 50 MG tablet Take 50 mg by mouth daily before breakfast.  04/25/11  Yes Historical Provider, MD  traMADol (ULTRAM) 50 MG tablet Take 50 mg by mouth every 6 (six) hours as needed. Pain    Yes Historical  Provider, MD  bisacodyl (DULCOLAX) 10 MG suppository Place 1 suppository (10 mg total) rectally daily as needed. 04/24/12 05/04/12  Tarshia Kot, PA  docusate sodium 100 MG CAPS Take 100 mg by mouth 2 (two) times daily. 04/24/12 05/04/12  Kathern Lobosco, PA  methocarbamol (ROBAXIN) 500 MG tablet Take 1 tablet (500 mg total) by mouth every 6 (six) hours as needed. 04/24/12 05/04/12  Charlese Gruetzmacher, PA  ondansetron (ZOFRAN) 4 MG tablet Take 1 tablet (4 mg total) by mouth every 6 (six) hours as needed for nausea. 04/24/12 05/01/12  Adriyanna Christians, PA  oxyCODONE (OXY IR/ROXICODONE) 5 MG immediate release tablet Take 1-2 tablets (5-10 mg total) by mouth every 4 (four) hours as needed for pain. 04/24/12 05/04/12  Shaquon Gropp, PA  polyethylene glycol (MIRALAX / GLYCOLAX) packet Take 17 g by mouth daily as needed. 04/24/12 04/27/12  Kairen Hallinan Julien Girt, PA  rivaroxaban (XARELTO) 10 MG TABS tablet Take 1 tablet (10 mg total) by mouth daily with breakfast. Take Xarelto for two and a half more weeks, then discontinue Xarelto. Once the patient has completed the Xarelto, they may resume the 81 mg Aspirin. 04/24/12   December Hedtke Julien Girt, PA    Diet: Regular diet Activity:WBAT Follow-up:in 2 weeks Disposition - Skilled nursing facility Discharged Condition: fair   Discharge Orders    Future Orders Please Complete By Expires   Diet general      Call MD / Call 911      Comments:   If you experience chest pain or shortness of breath, CALL 911 and be transported to the hospital emergency room.  If you develope a fever above 101 F, pus (white drainage) or increased drainage or redness at the wound, or calf pain, call your surgeon's office.   Discharge instructions      Comments:   Pick up stool softner and laxative for home. Do not submerge incision under water. May shower. Continue to use ice for pain and swelling from surgery.  Take Xarelto  for two and a half more weeks, then  discontinue Xarelto. Once the patient has completed the Xarelto, they may resume the 81 mg Aspirin.   Constipation Prevention      Comments:   Drink plenty of fluids.  Prune juice may be helpful.  You may use a stool softener, such as Colace (over the counter) 100 mg twice a day.  Use MiraLax (over the counter) for constipation as needed.   Increase activity slowly as tolerated      Patient may shower      Comments:   You may shower without a dressing once there is no drainage.  Do not wash over the wound.  If drainage remains, do not shower until drainage stops.   Driving restrictions      Comments:   No driving until released by the physician.   Lifting restrictions      Comments:   No lifting until released by the physician.   TED hose      Comments:   Use stockings (TED hose) for 3 weeks on both leg(s).  You may remove them at night for sleeping.   Change dressing      Comments:   Change dressing daily with sterile 4 x 4 inch gauze dressing and apply TED hose. Do not submerge the incision under water.   Do not put a pillow under the knee. Place it under the heel.      Do not sit on low chairs, stoools or toilet seats, as it may be difficult to get up from low surfaces        Medication List  As of 04/24/2012  7:51 AM   STOP taking these medications         ALENDRONATE SODIUM PO      aspirin 81 MG tablet      CALTRATE 600 PLUS-VIT D PO      CENTRUM SILVER PO      Vitamin D3 1000 UNITS Caps         TAKE these medications         acetaminophen 325 MG tablet   Commonly known as: TYLENOL   Take 650 mg by mouth every 6 (six) hours as needed. Pain        bisacodyl 10 MG suppository   Commonly known as: DULCOLAX   Place 1 suppository (10 mg total) rectally daily as needed.      DSS 100 MG Caps   Take 100 mg by mouth 2 (two) times daily.      fenofibrate 160 MG tablet   Take 160 mg by mouth every evening.      losartan-hydrochlorothiazide 100-12.5 MG per tablet    Commonly known as: HYZAAR   Take 0.5 tablets by mouth daily before breakfast.      metFORMIN 500 MG tablet   Commonly known as: GLUCOPHAGE   Take 500 mg by mouth 2 (two) times daily with a meal.      methocarbamol 500 MG tablet   Commonly known as: ROBAXIN   Take 1 tablet (500 mg total) by mouth every 6 (six) hours as needed.      ondansetron 4 MG tablet   Commonly known as: ZOFRAN   Take 1 tablet (4 mg total) by mouth every 6 (six) hours as needed for nausea.      oxyCODONE 5 MG immediate release tablet   Commonly known as: Oxy IR/ROXICODONE   Take 1-2 tablets (5-10 mg total) by mouth every 4 (  four) hours as needed for pain.      polyethylene glycol packet   Commonly known as: MIRALAX / GLYCOLAX   Take 17 g by mouth daily as needed.      rivaroxaban 10 MG Tabs tablet   Commonly known as: XARELTO   Take 1 tablet (10 mg total) by mouth daily with breakfast. Take Xarelto for two and a half more weeks, then discontinue Xarelto.  Once the patient has completed the Xarelto, they may resume the 81 mg Aspirin.      rosuvastatin 20 MG tablet   Commonly known as: CRESTOR   Take 20 mg by mouth daily before breakfast.      sertraline 50 MG tablet   Commonly known as: ZOLOFT   Take 50 mg by mouth daily before breakfast.      TOVIAZ 8 MG Tb24   Generic drug: fesoterodine   Take 8 mg by mouth every evening.      traMADol 50 MG tablet   Commonly known as: ULTRAM   Take 50 mg by mouth every 6 (six) hours as needed. Pain             Follow-up Information    Follow up with Loanne Drilling, MD. Schedule an appointment as soon as possible for a visit in 2 weeks.   Contact information:   [facility] [address], [address] [place] [address] [phone]          Signed: Patrica Duel 04/24/2012, 7:51 AM

## 2012-04-24 NOTE — Progress Notes (Signed)
Subjective: 3 Days Post-Op Procedure(s) (LRB): TOTAL KNEE ARTHROPLASTY (Right) Patient reports pain as mild and moderate.  Also complaints of the left ankle.  X-rays done.  Previous fracture.  Add ASO. Patient seen in rounds with Dr. Lequita Halt. Patient is well, but has had some minor complaints of pain in the left ankle and right knee, requiring pain medications Patient is ready to go SNF if bed available.  Objective: Vital signs in last 24 hours: Temp:  [98.1 F (36.7 C)-98.6 F (37 C)] 98.2 F (36.8 C) (06/10 0450) Pulse Rate:  [96-101] 98  (06/10 0450) Resp:  [18] 18  (06/10 0450) BP: (128-136)/(75-82) 136/82 mmHg (06/10 0450) SpO2:  [90 %-98 %] 98 % (06/10 0450)  Intake/Output from previous day:  Intake/Output Summary (Last 24 hours) at 04/24/12 0742 Last data filed at 04/24/12 4540  Gross per 24 hour  Intake    643 ml  Output   1600 ml  Net   -957 ml    Intake/Output this shift:    Labs:  Basename 04/24/12 0428 04/23/12 0755 04/22/12 0520  HGB 8.7* 9.6* 9.5*    Basename 04/24/12 0428 04/23/12 0755  WBC 10.2 11.3*  RBC 2.83* 3.05*  HCT 27.2* 29.5*  PLT 214 214    Basename 04/23/12 0755 04/22/12 0520  NA 135 136  K 3.7 4.0  CL 100 104  CO2 24 23  BUN 7 9  CREATININE 0.53 0.51  GLUCOSE 147* 111*  CALCIUM 8.4 7.9*   No results found for this basename: LABPT:2,INR:2 in the last 72 hours  EXAM: General - Patient is Alert and Appropriate Extremity - Neurovascular intact Sensation intact distally Incision - clean, dry, no drainage, healing Motor Function - intact, moving foot and toes well on exam.   Assessment/Plan: 3 Days Post-Op Procedure(s) (LRB): TOTAL KNEE ARTHROPLASTY (Right) Procedure(s) (LRB): TOTAL KNEE ARTHROPLASTY (Right) Past Medical History  Diagnosis Date  . Hypertension   . Hypercholesterolemia   . Obesity   . Normal nuclear stress test Jan 2012    No ischemia. EF 81%  . Sleep apnea     DOES NOT USE CPAP-UNABLE TO TOLERATE  .  Anxiety   . Depression   . Diabetes mellitus     ORAL MED  . Osteopenia   . Shortness of breath     WITH EXERTION  . Hypothyroidism     STATES SHE NO LONGER NEEDS THYROID SUPPLEMENT  . Osteoarthritis (arthritis due to wear and tear of joints)     PAIN AND OA BOTH KNEES  . Urinary frequency    Principal Problem:  *OA (osteoarthritis) of knee   Discharge to SNF Diet - Regular diet Follow up - in 2 weeks Activity - WBAT Disposition - Skilled nursing facility Condition Upon Discharge - Fair D/C Meds - See DC Summary DVT Prophylaxis - Xarelto  Adrienne [name] 04/24/2012, 7:42 AM

## 2012-11-18 ENCOUNTER — Emergency Department (HOSPITAL_COMMUNITY): Payer: Medicare Other

## 2012-11-18 ENCOUNTER — Encounter (HOSPITAL_COMMUNITY): Payer: Self-pay

## 2012-11-18 ENCOUNTER — Emergency Department (HOSPITAL_COMMUNITY)
Admission: EM | Admit: 2012-11-18 | Discharge: 2012-11-18 | Disposition: A | Discharge status: Home / Self Care | Payer: Medicare Other | Attending: Emergency Medicine | Admitting: Emergency Medicine

## 2012-11-18 DIAGNOSIS — S8990XA Unspecified injury of unspecified lower leg, initial encounter: Secondary | ICD-10-CM | POA: Insufficient documentation

## 2012-11-18 DIAGNOSIS — E669 Obesity, unspecified: Secondary | ICD-10-CM | POA: Insufficient documentation

## 2012-11-18 DIAGNOSIS — E041 Nontoxic single thyroid nodule: Secondary | ICD-10-CM

## 2012-11-18 DIAGNOSIS — F3289 Other specified depressive episodes: Secondary | ICD-10-CM | POA: Insufficient documentation

## 2012-11-18 DIAGNOSIS — Z87448 Personal history of other diseases of urinary system: Secondary | ICD-10-CM | POA: Insufficient documentation

## 2012-11-18 DIAGNOSIS — M899 Disorder of bone, unspecified: Secondary | ICD-10-CM | POA: Insufficient documentation

## 2012-11-18 DIAGNOSIS — IMO0002 Reserved for concepts with insufficient information to code with codable children: Secondary | ICD-10-CM | POA: Insufficient documentation

## 2012-11-18 DIAGNOSIS — E039 Hypothyroidism, unspecified: Secondary | ICD-10-CM | POA: Insufficient documentation

## 2012-11-18 DIAGNOSIS — F329 Major depressive disorder, single episode, unspecified: Secondary | ICD-10-CM | POA: Insufficient documentation

## 2012-11-18 DIAGNOSIS — W108XXA Fall (on) (from) other stairs and steps, initial encounter: Secondary | ICD-10-CM | POA: Insufficient documentation

## 2012-11-18 DIAGNOSIS — I10 Essential (primary) hypertension: Secondary | ICD-10-CM | POA: Insufficient documentation

## 2012-11-18 DIAGNOSIS — E119 Type 2 diabetes mellitus without complications: Secondary | ICD-10-CM | POA: Insufficient documentation

## 2012-11-18 DIAGNOSIS — M1710 Unilateral primary osteoarthritis, unspecified knee: Secondary | ICD-10-CM | POA: Insufficient documentation

## 2012-11-18 DIAGNOSIS — W19XXXA Unspecified fall, initial encounter: Secondary | ICD-10-CM

## 2012-11-18 DIAGNOSIS — F411 Generalized anxiety disorder: Secondary | ICD-10-CM | POA: Insufficient documentation

## 2012-11-18 DIAGNOSIS — I6529 Occlusion and stenosis of unspecified carotid artery: Secondary | ICD-10-CM

## 2012-11-18 DIAGNOSIS — Y92009 Unspecified place in unspecified non-institutional (private) residence as the place of occurrence of the external cause: Secondary | ICD-10-CM | POA: Insufficient documentation

## 2012-11-18 DIAGNOSIS — E7800 Pure hypercholesterolemia, unspecified: Secondary | ICD-10-CM | POA: Insufficient documentation

## 2012-11-18 DIAGNOSIS — Y9389 Activity, other specified: Secondary | ICD-10-CM | POA: Insufficient documentation

## 2012-11-18 MED ORDER — TRAMADOL HCL 50 MG PO TABS
50.0000 mg | ORAL_TABLET | Freq: Once | ORAL | Status: AC
  Administered 2012-11-18: 50 mg via ORAL
  Filled 2012-11-18: qty 1

## 2012-11-18 NOTE — ED Notes (Signed)
[id number]<AV> Expected date:11/18/12<BR> Expected time: 7:27 PM<BR> Means of arrival:<BR> Comments:<BR> Fall

## 2012-11-18 NOTE — ED Provider Notes (Signed)
History     CSN: [id number]  Arrival date & time 11/18/12  1952   First MD Initiated Contact with Patient 11/18/12 2012      Chief Complaint  Patient presents with  . Fall   HPI  History provided by the patient. Patient is a 77 year old female who presents with injuries after a fall. Patient was walking up her porch steps using her cane when she reached the top step and reports losing her balance. She fell backwards down 4 steps made of cement. Patient reports bumping the back of her head but denies any LOC. She denies headache currently. Complains of some pain in the left lower extremity. Denies back or neck pains. Patient was transported by EMS. Pain is currently described as mild/moderate. She denies any numbness or weakness.   Past Medical History  Diagnosis Date  . Hypertension   . Hypercholesterolemia   . Obesity   . Normal nuclear stress test Jan 2012    No ischemia. EF 81%  . Sleep apnea     DOES NOT USE CPAP-UNABLE TO TOLERATE  . Anxiety   . Depression   . Diabetes mellitus     ORAL MED  . Osteopenia   . Shortness of breath     WITH EXERTION  . Hypothyroidism     STATES SHE NO LONGER NEEDS THYROID SUPPLEMENT  . Osteoarthritis (arthritis due to wear and tear of joints)     PAIN AND OA BOTH KNEES  . Urinary frequency     Past Surgical History  Procedure Date  . Vesicovaginal fistula closure w/ tah   . Thyroidectomy, partial   . Cardiac catheterization 02/03/1999    EF 70%  . US echocardiography 08/21/2004    EF 60-65%  . Cardiovascular stress test 11/30/2010    EF 81%, NORMAL  . Bilateral oophorectomy   . Abdominal hysterectomy   . Total knee arthroplasty 04/21/2012    Procedure: TOTAL KNEE ARTHROPLASTY;  Surgeon: Loanne Drilling, MD;  Location: [facility];  Service: Orthopedics;  Laterality: Right;    Family History  Problem Relation Age of Onset  . Heart disease Father   . Hypertension Father   . Heart attack Father   . Colon cancer Mother   . Heart attack  Mother   . Hypertension Mother   . Breast cancer Sister     History  Substance Use Topics  . Smoking status: Never Smoker   . Smokeless tobacco: Not on file  . Alcohol Use: No    OB History    Grav Para Term Preterm Abortions TAB SAB Ect Mult Living                  Review of Systems  Neurological: Negative for light-headedness and headaches.  All other systems reviewed and are negative.    Allergies  Ibuprofen and Sulfonamide derivatives  Home Medications   Current Outpatient Rx  Name  Route  Sig  Dispense  Refill  . ACETAMINOPHEN 325 MG PO TABS   Oral   Take 650 mg by mouth every 6 (six) hours as needed. Pain          . FENOFIBRATE 160 MG PO TABS   Oral   Take 160 mg by mouth every evening.          . FESOTERODINE FUMARATE ER 8 MG PO TB24   Oral   Take 8 mg by mouth every evening.          Marland Kitchen  LOSARTAN POTASSIUM-HCTZ 100-12.5 MG PO TABS   Oral   Take 0.5 tablets by mouth daily before breakfast.          . METFORMIN HCL 500 MG PO TABS   Oral   Take 500 mg by mouth 2 (two) times daily with a meal.         . RIVAROXABAN 10 MG PO TABS   Oral   Take 1 tablet (10 mg total) by mouth daily with breakfast. Take Xarelto for two and a half more weeks, then discontinue Xarelto. Once the patient has completed the Xarelto, they may resume the 81 mg Aspirin.   18 tablet   0   . ROSUVASTATIN CALCIUM 20 MG PO TABS   Oral   Take 20 mg by mouth daily before breakfast.          . SERTRALINE HCL 50 MG PO TABS   Oral   Take 50 mg by mouth daily before breakfast.          . TRAMADOL HCL 50 MG PO TABS   Oral   Take 50 mg by mouth every 6 (six) hours as needed. Pain            BP 173/49  Pulse 78  Temp 97.8 F (36.6 C) (Oral)  Resp 16  SpO2 99%  Physical Exam  Nursing note and vitals reviewed. Constitutional: She is oriented to person, place, and time. She appears well-developed and well-nourished. No distress.  HENT:  Head: Normocephalic.         Small hematoma to posterior scalp. No or laceration. No battle sign or raccoon eyes  Neck: Normal range of motion. Neck supple.       No cervical midline tenderness. Normal full range of motion.  Cardiovascular: Normal rate and regular rhythm.   Pulmonary/Chest: Effort normal and breath sounds normal.  Abdominal: Soft.  Musculoskeletal:       Cervical back: Normal.       Thoracic back: Normal.       Lumbar back: Normal.       Small abrasion over dorsal right hand with dry blood. No significant tenderness. Full range of motion and normal grip strength. Normal sensation in hands and feet. Normal distal pulses in extremities.  Mild to moderate tenderness around proximal tibia and fibula area through the mid calf. No significant ankle tenderness. There is mild to moderate bilateral pitting edema of lower extremities. No gross deformities. Normal color.  Neurological: She is alert and oriented to person, place, and time.  Skin: Skin is warm and dry. No rash noted.  Psychiatric: She has a normal mood and affect. Her behavior is normal.    ED Course  Procedures   Dg Tibia/fibula Left  11/18/2012  *RADIOLOGY REPORT*  Clinical Data: Left lower leg pain and swelling following a fall.  LEFT TIBIA AND FIBULA - 2 VIEW  Comparison: 04/23/2012.  Findings: Previously demonstrated old, healed distal tibial shaft fracture and screw and plate fixation of the distal fibula.  No acute fracture or dislocation is seen.  Tricompartmental knee degenerative changes are noted.  IMPRESSION:  1.  No fracture or dislocation. 2.  Moderate to marked tricompartmental knee degenerative changes.   Original Report Authenticated By: Beckie Salts, M.D.    Ct Head Wo Contrast  11/18/2012  *RADIOLOGY REPORT*  Clinical Data:  Forehead abrasions, headache and neck pain following a fall.  CT HEAD WITHOUT CONTRAST CT CERVICAL SPINE WITHOUT CONTRAST  Technique:  Multidetector CT imaging  of the head and cervical spine was performed  following the standard protocol without intravenous contrast.  Multiplanar CT image reconstructions of the cervical spine were also generated.  Comparison:   None  CT HEAD  Findings: Diffusely enlarged ventricles and subarachnoid spaces. No skull fracture, intracranial hemorrhage or paranasal sinus air- fluid levels.  Unremarkable bones and paranasal sinuses.  IMPRESSION:  1.  No acute abnormality. 2.  Atrophy and chronic small vessel white matter ischemic changes.  CT CERVICAL SPINE  Findings: Multilevel degenerative changes.  No prevertebral soft tissue swelling, fractures or subluxations.  Bilateral carotid artery calcifications.  Enlarged and heterogeneous left lobe of the thyroid gland containing multiple coarse calcifications.  The right lobe of the thyroid gland is small and heterogeneous.  IMPRESSION:  1.  No fracture or subluxation. 2.  Multilevel degenerative changes. 3.  Bilateral carotid artery atheromatous calcifications. 4.  Thyroid goiter with probable one or more masses in the left lobe.  This could be better determined with elective thyroid ultrasound.   Original Report Authenticated By: Beckie Salts, M.D.    Ct Cervical Spine Wo Contrast  11/18/2012  *RADIOLOGY REPORT*  Clinical Data:  Forehead abrasions, headache and neck pain following a fall.  CT HEAD WITHOUT CONTRAST CT CERVICAL SPINE WITHOUT CONTRAST  Technique:  Multidetector CT imaging of the head and cervical spine was performed following the standard protocol without intravenous contrast.  Multiplanar CT image reconstructions of the cervical spine were also generated.  Comparison:   None  CT HEAD  Findings: Diffusely enlarged ventricles and subarachnoid spaces. No skull fracture, intracranial hemorrhage or paranasal sinus air- fluid levels.  Unremarkable bones and paranasal sinuses.  IMPRESSION:  1.  No acute abnormality. 2.  Atrophy and chronic small vessel white matter ischemic changes.  CT CERVICAL SPINE  Findings: Multilevel  degenerative changes.  No prevertebral soft tissue swelling, fractures or subluxations.  Bilateral carotid artery calcifications.  Enlarged and heterogeneous left lobe of the thyroid gland containing multiple coarse calcifications.  The right lobe of the thyroid gland is small and heterogeneous.  IMPRESSION:  1.  No fracture or subluxation. 2.  Multilevel degenerative changes. 3.  Bilateral carotid artery atheromatous calcifications. 4.  Thyroid goiter with probable one or more masses in the left lobe.  This could be better determined with elective thyroid ultrasound.   Original Report Authenticated By: Beckie Salts, M.D.      1. Fall   2. Thyroid nodule   3. Carotid artery calcification       MDM  8:20PM patient seen and evaluated. Patient is well comfortable at this time does not complain of significant discomforts.  Patient continues to do well. Patient is able to stand and in the leg with some assistance. CT scans and x-rays unremarkable at this time patient felt stable for discharge home. I did discuss with patient findings on CT scan of carotid artery calcifications and thyroid nodules. She has been instructed to followup with PCP and will do so on Monday. She does have upcoming appointment in 2 weeks.      Angus Seller, [place] 11/18/12 2154

## 2012-11-18 NOTE — ED Provider Notes (Signed)
Medical screening examination/treatment/procedure(s) were performed by non-physician practitioner and as supervising physician I was immediately available for consultation/collaboration.   Pricsilla Lindvall, MD 11/18/12 2336 

## 2012-11-18 NOTE — ED Notes (Signed)
Pt had a fall from standing position down 4 steps. Denies LOC, neck or back pain. Upon ambulation pt c/o of pain in L calf. Pain rating 3/10. No deformities or bruising noted. Edema noted on ankle ut pt reports normal finding.

## 2012-12-05 ENCOUNTER — Other Ambulatory Visit: Payer: Self-pay | Admitting: Internal Medicine

## 2012-12-05 ENCOUNTER — Ambulatory Visit
Admission: RE | Admit: 2012-12-05 | Discharge: 2012-12-05 | Disposition: A | Discharge status: Home / Self Care | Payer: Medicare Other | Source: Ambulatory Visit | Attending: Internal Medicine | Admitting: Internal Medicine

## 2012-12-05 DIAGNOSIS — R609 Edema, unspecified: Secondary | ICD-10-CM

## 2012-12-18 ENCOUNTER — Other Ambulatory Visit: Payer: Self-pay | Admitting: Orthopedic Surgery

## 2012-12-18 MED ORDER — DEXAMETHASONE SODIUM PHOSPHATE 10 MG/ML IJ SOLN: 10.0000 mg | Freq: Once | INTRAMUSCULAR | Status: DC

## 2012-12-18 NOTE — Progress Notes (Signed)
Preoperative surgical orders have been place into the Epic hospital system for [facility] Cathy on 12/18/2012, 7:29 AM  by Patrica Duel for surgery on 01/12/2013.  Preop Total Knee orders including Experal, IV Tylenol, and IV Decadron as long as there are no contraindications to the above medications. Avel Peace, PA-C

## 2012-12-19 ENCOUNTER — Other Ambulatory Visit: Payer: Self-pay | Admitting: Orthopedic Surgery

## 2012-12-19 NOTE — H&P (Signed)
Adrienne [name]  DOB: 16-Nov-1935 Married / Language: English / Race: White Female  Date of Admission:  01/12/2013  Chief Complaint:  Left Knee Pain  History of Present Illness The patient is a 77 year old female who comes in for a preoperative History and Physical. The patient is scheduled for a left total knee arthroplasty to be performed by Dr. Gus Rankin. Aluisio, MD at [facility] on 01/12/2013. The patient is being followed for their left knee pain and osteoarthritis. They are now out from from a Synvisc series. Symptoms reported today include: pain, pain with weightbearing and difficulty arising from chair. and report their pain level to be moderate. The following medication has been used for pain control: Tramadol. Note for "Follow-up Knee": She states the Synvisc helped a little, but she continues to have pain. She has tried cortisone injections also. Right knee is doing very well at this time. She is very pleased with that. The majority of her problem now is with the left knee. She has failed cortisone and visco supplements. She said it is hurting at all times. She is ready to proceed with the left total knee replacement. They have been treated conservatively in the past for the above stated problem and despite conservative measures, they continue to have progressive pain and severe functional limitations and dysfunction. They have failed non-operative management including home exercise, medications. It is felt that they would benefit from undergoing total joint replacement. Risks and benefits of the procedure have been discussed with the patient and they elect to proceed with surgery. There are no active contraindications to surgery such as ongoing infection or rapidly progressive neurological disease.   Problem List Primary osteoarthritis of one knee (715.16)  Allergies Sulfathiazole *Sulfonamides** Ibuprofen *ANALGESICS - ANTI-INFLAMMATORY*   Family  History Mother. Deceased, Colon Cancer. age 74 Hypertension. mother Father. Deceased. Cancer. mother   Social History Living situation. live alone Marital status. married Pain Contract. no Illicit drug use. no Previously in rehab. no Alcohol use. never consumed alcohol Tobacco use. never smoker Children. 1 Current work status. retired Exercise. Exercises never Drug/Alcohol Rehab (Currently). no Advance Directives. Living Will, Healthcare POA Post-Surgical Plans. Plan is to go to [facility] after the hospital stay.   Medication History Namenda (10MG  Tablet, Oral) Active. Atorvastatin Calcium (20MG  Tablet, Oral) Active. Cephalexin (250MG  Capsule, Oral) Active. Aspirin EC (81MG  Tablet DR, Oral) Active. Vitamin D (1000UNIT Capsule, Oral) Active. TraMADol HCl (50MG  Tablet, Oral) Active. Centrum Silver ( Oral) Active. Acetaminophen (500MG  Capsule, Oral) Active. Losartan Potassium-HCTZ (100-25MG  Tablet, Oral) Active. MetFORMIN HCl (500MG  Tablet, Oral) Active. Sertraline HCl (50MG  Tablet, Oral) Active. Caltrate 600+D (600-400MG -UNIT Tablet Chewable, Oral) Active. Toviaz (8MG  Tablet ER 24HR, Oral) Active.   Past Surgical History Hysterectomy. complete (non-cancerous) Thyroidectomy; Total   Medical History Goiter Obesity Chronic Low Back Pain Allergic Rhinitis Chronic Neck Pain Microhematuria Depression Anxiety Disorder Diabetes Mellitus, Type II Urinary Frequency Osteopenia History of Chronic Urethritis Multiple Colonic Polyps History of Fatty Liver Cataract. Bilateral Osteoarthritis Sleep Apnea High blood pressure   Vitals Weight: 203 lb Height: 63 in Weight was reported by patient. Height was reported by patient. Body Surface Area: 2.02 m Body Mass Index: 35.96 kg/m Pulse: 84 (Regular) Resp.: 14 (Unlabored) BP: 106/58 (Sitting, Left Arm, Standard)    Physical Exam The physical exam findings are as  follows:  Note: Patient is a 77 year old female with continued left knee pain. Patient is accompanied today by her daughter.   General Mental Status -  Alert, cooperative and good historian. General Appearance- pleasant. Not in acute distress. Orientation- Oriented X3. Build & Nutrition- Well nourished and Well developed.   Head and Neck Head- normocephalic, atraumatic . Neck Global Assessment- supple. no bruit auscultated on the right and no bruit auscultated on the left.   Eye Vision- Wears corrective lenses. Pupil- Bilateral- Regular and Round. Motion- Bilateral- EOMI.   Chest and Lung Exam Auscultation: Breath sounds:- clear at anterior chest wall and - clear at posterior chest wall. Adventitious sounds:- No Adventitious sounds.   Cardiovascular Auscultation:Rhythm- Regular rate and rhythm. Heart Sounds- S1 WNL and S2 WNL. Murmurs & Other Heart Sounds:Auscultation of the heart reveals - No Murmurs.   Abdomen Inspection:Contour- Generalized moderate distention. Palpation/Percussion:Tenderness- Abdomen is non-tender to palpation. Rigidity (guarding)- Abdomen is soft. Auscultation:Auscultation of the abdomen reveals - Bowel sounds normal.   Female Genitourinary Not done, not pertinent to present illness  Musculoskeletal On exam she is alert and oriented in no apparent distress. Her right knee looks great. There is no swelling at all in the right knee. Range is about 0 to 118 degrees. There is no instability noted about the right knee. Her left knee shows no effusion. There is marked crepitus on range of motion of the left knee. Range 5 to 120. She is tender medial greater than lateral side of the knee. She does have some swelling on the distal lateral leg and ankle. She is tender on the medial and lateral left ankle.  RADIOGRAPHS: AP and lateral of both knees show advanced end stage arthritis of the left knee bone on bone throughout with  large osteophytes. Right knee shows prosthesis in excellent alignment with no periprosthetic abnormalities.  Assessment & Plan Primary osteoarthritis of one knee (715.16) Impression: Left Knee  Note: Plan is for a Left Total Knee Replacement by Dr. Lequita Halt.  Plan is to go to Rehab at [facility] following the hospital stay.  Signed electronically by Roberts Gaudy, PA-C

## 2013-01-01 ENCOUNTER — Encounter (HOSPITAL_COMMUNITY): Payer: Self-pay | Admitting: Pharmacy Technician

## 2013-01-05 ENCOUNTER — Encounter (HOSPITAL_COMMUNITY): Payer: Self-pay

## 2013-01-05 ENCOUNTER — Encounter (HOSPITAL_COMMUNITY)
Admission: RE | Admit: 2013-01-05 | Discharge: 2013-01-05 | Disposition: A | Discharge status: Home / Self Care | Payer: Medicare Other | Source: Ambulatory Visit | Attending: Orthopedic Surgery | Admitting: Orthopedic Surgery

## 2013-01-05 ENCOUNTER — Other Ambulatory Visit (HOSPITAL_COMMUNITY): Payer: Self-pay | Admitting: *Deleted

## 2013-01-05 LAB — URINALYSIS, ROUTINE W REFLEX MICROSCOPIC
Bilirubin Urine: NEGATIVE
Glucose, UA: NEGATIVE mg/dL
Nitrite: NEGATIVE
Specific Gravity, Urine: 1.017 (ref 1.005–1.030)
pH: 5.5 (ref 5.0–8.0)

## 2013-01-05 LAB — URINE MICROSCOPIC-ADD ON

## 2013-01-05 LAB — COMPREHENSIVE METABOLIC PANEL
ALT: 20 U/L (ref 0–35)
AST: 34 U/L (ref 0–37)
Albumin: 3.6 g/dL (ref 3.5–5.2)
Alkaline Phosphatase: 45 U/L (ref 39–117)
CO2: 27 mEq/L (ref 19–32)
Chloride: 102 mEq/L (ref 96–112)
GFR calc non Af Amer: 83 mL/min — ABNORMAL LOW (ref >90)
Potassium: 4.7 mEq/L (ref 3.5–5.1)
Total Bilirubin: 0.8 mg/dL (ref 0.3–1.2)

## 2013-01-05 LAB — CBC
Platelets: 264 10*3/uL (ref 150–400)
RBC: 3.95 MIL/uL (ref 3.87–5.11)
RDW: 13.4 % (ref 11.5–15.5)
WBC: 6.9 10*3/uL (ref 4.0–10.5)

## 2013-01-05 LAB — SURGICAL PCR SCREEN: Staphylococcus aureus: NEGATIVE

## 2013-01-05 LAB — APTT: aPTT: 33 seconds (ref 24–37)

## 2013-01-05 NOTE — Patient Instructions (Addendum)
Jadesola Poynter Liwanag  01/05/2013                           YOUR PROCEDURE IS SCHEDULED ON: 01/12/13               PLEASE REPORT TO SHORT STAY CENTER AT :  5:15 am               CALL THIS NUMBER IF ANY PROBLEMS THE DAY OF SURGERY :  [phone]                      REMEMBER:   Do not eat food or drink liquids AFTER MIDNIGHT   Take these medicines the morning of surgery with A SIP OF WATER: NAMENDA / ZOLOFT / TRAMADOL IF NEEDED   Do not wear jewelry, make-up   Do not wear lotions, powders, or perfumes.   Do not shave legs or underarms 12 hrs. before surgery (men may shave face)  Do not bring valuables to the hospital.  Contacts, dentures or bridgework may not be worn into surgery.  Leave suitcase in the car. After surgery it may be brought to your room.  For patients admitted to the hospital more than one night, checkout time is 11:00                          The day of discharge.   Patients discharged the day of surgery will not be allowed to drive home                             If going home same day of surgery, must have someone stay with you first                           24 hrs at home and arrange for some one to drive you home from hospital.    Special Instructions:   Please read over the following fact sheets that you were given:               1. MRSA  INFORMATION                      2. [place] PREPARING FOR SURGERY SHEET               3. INCENTIVE SPIROMETER                                                X_____________________________________________________________________        Failure to follow these instructions may result in cancellation of your surgery

## 2013-01-07 ENCOUNTER — Other Ambulatory Visit: Payer: Self-pay | Admitting: Orthopedic Surgery

## 2013-01-07 NOTE — H&P (Signed)
Adrienne [name]  DOB: July 12, 1936 Married / Language: English / Race: White Female  Date of Admission:  01/12/2013  Chief Complaint:  Left Knee Pain  History of Present Illness The patient is a 77 year old female who comes in for a preoperative History and Physical. The patient is scheduled for a left total knee arthroplasty to be performed by Dr. Gus Rankin. Aluisio, MD at [facility] on 01/12/2013. The patient is being followed for their left knee pain and osteoarthritis. They are out from from a Synvisc series. Symptoms reported today include: pain, pain with weightbearing and difficulty arising from chair. and report their pain level to be moderate. The following medication has been used for pain control: Tramadol. Note for "Follow-up Knee": She states the Synvisc helped a little, but she continues to have pain. Right knee is doing very well at this time. She is very pleased with that. The majority of her problem now is with the left knee. She has failed cortisone and visco supplements. She said it is hurting at all times. She is ready to proceed with the left total knee replacement. They have been treated conservatively in the past for the above stated problem and despite conservative measures, they continue to have progressive pain and severe functional limitations and dysfunction. They have failed non-operative management including home exercise, medications. It is felt that they would benefit from undergoing total joint replacement. Risks and benefits of the procedure have been discussed with the patient and they elect to proceed with surgery. There are no active contraindications to surgery such as ongoing infection or rapidly progressive neurological disease.  Problem List Primary osteoarthritis of one knee (715.16) S/P Right total knee arthroplasty (V43.65)   Allergies Sulfathiazole *Sulfonamides** Ibuprofen *ANALGESICS - ANTI-INFLAMMATORY*   Family  History Mother. Deceased, Colon Cancer. age 30 Hypertension. mother Father. Deceased. Cancer. mother   Social History Living situation. live alone Marital status. married Pain Contract. no Illicit drug use. no Previously in rehab. no Alcohol use. never consumed alcohol Tobacco use. never smoker Children. 1 Current work status. retired Exercise. Exercises never Drug/Alcohol Rehab (Currently). no Advance Directives. Living Will, Healthcare POA Post-Surgical Plans. Plan is to go to [facility] after the hospital stay.   Medication History Namenda (10MG  Tablet, Oral) Active. Atorvastatin Calcium (20MG  Tablet, Oral) Active. Cephalexin (250MG  Capsule, Oral) Active. Aspirin EC (81MG  Tablet DR, Oral) Active. Vitamin D (1000UNIT Capsule, Oral) Active. TraMADol HCl (50MG  Tablet, Oral) Active. Centrum Silver ( Oral) Active. Acetaminophen (500MG  Capsule, Oral) Active. Losartan Potassium-HCTZ (100-25MG  Tablet, Oral) Active. MetFORMIN HCl (500MG  Tablet, Oral) Active. Sertraline HCl (50MG  Tablet, Oral) Active. Caltrate 600+D (600-400MG -UNIT Tablet Chewable, Oral) Active. Toviaz (8MG  Tablet ER 24HR, Oral) Active.   Past Surgical History Hysterectomy. complete (non-cancerous) Thyroidectomy; Total   Medical History Goiter Obesity Chronic Low Back Pain Allergic Rhinitis Chronic Neck Pain Microhematuria Depression Anxiety Disorder Diabetes Mellitus, Type II Urinary Frequency Osteopenia History of Chronic Urethritis Multiple Colonic Polyps History of Fatty Liver Cataract. Bilateral Osteoarthritis Sleep Apnea High blood pressure    Vitals Weight: 203 lb Height: 63 in Weight was reported by patient. Height was reported by patient. Body Surface Area: 2.02 m Body Mass Index: 35.96 kg/m Pulse: 84 (Regular) Resp.: 14 (Unlabored) BP: 106/58 (Sitting, Left Arm, Standard)    Physical Exam The physical exam findings are as  follows:  Note: Patient is a 77 year old female with continued left knee pain. Patient is accompanied today by her daughter.   General Mental Status -  Alert, cooperative and good historian. General Appearance- pleasant. Not in acute distress. Orientation- Oriented X3. Build & Nutrition- Well nourished and Well developed.   Head and Neck Head- normocephalic, atraumatic . Neck Global Assessment- supple. no bruit auscultated on the right and no bruit auscultated on the left.   Eye Vision- Wears corrective lenses. Pupil- Bilateral- Regular and Round. Motion- Bilateral- EOMI.   Chest and Lung Exam Auscultation: Breath sounds:- clear at anterior chest wall and - clear at posterior chest wall. Adventitious sounds:- No Adventitious sounds.   Cardiovascular Auscultation:Rhythm- Regular rate and rhythm. Heart Sounds- S1 WNL and S2 WNL. Murmurs & Other Heart Sounds:Auscultation of the heart reveals - No Murmurs.   Abdomen Inspection:Contour- Generalized moderate distention. Palpation/Percussion:Tenderness- Abdomen is non-tender to palpation. Rigidity (guarding)- Abdomen is soft. Auscultation:Auscultation of the abdomen reveals - Bowel sounds normal.   Female Genitourinary Not done, not pertinent to present illness  Musculoskeletal On exam she is alert and oriented in no apparent distress. Her right knee looks great. There is no swelling at all in the right knee. Range is about 0 to 118 degrees. There is no instability noted about the right knee. Her left knee shows no effusion. There is marked crepitus on range of motion of the left knee. Range 5 to 120. She is tender medial greater than lateral side of the knee. She does have some swelling on the distal lateral leg and ankle. She is tender on the medial and lateral left ankle.  RADIOGRAPHS: AP and lateral of both knees show advanced end stage arthritis of the left knee bone on bone throughout with  large osteophytes. Right knee shows prosthesis in excellent alignment with no periprosthetic abnormalities.  Assessment & Plan Primary osteoarthritis of one knee (715.16) Impression: Left Knee  Note: Plan is for a Left Total Knee Replacement by Dr. Lequita Halt.  Plan is to go to Rehab at [facility] following the hospital stay.  Signed electronically by Roberts Gaudy, PA-C

## 2013-01-12 ENCOUNTER — Inpatient Hospital Stay (HOSPITAL_COMMUNITY): Payer: Medicare Other | Admitting: Anesthesiology

## 2013-01-12 ENCOUNTER — Inpatient Hospital Stay (HOSPITAL_COMMUNITY)
Admission: RE | Admit: 2013-01-12 | Discharge: 2013-01-15 | DRG: 470 | Disposition: A | Discharge status: Skilled Nursing Facility | Payer: Medicare Other | Source: Ambulatory Visit | Attending: Orthopedic Surgery | Admitting: Orthopedic Surgery

## 2013-01-12 ENCOUNTER — Encounter (HOSPITAL_COMMUNITY): Payer: Self-pay | Admitting: Anesthesiology

## 2013-01-12 ENCOUNTER — Encounter (HOSPITAL_COMMUNITY): Payer: Self-pay | Admitting: *Deleted

## 2013-01-12 ENCOUNTER — Encounter (HOSPITAL_COMMUNITY)
Admission: RE | Disposition: A | Discharge status: Skilled Nursing Facility | Payer: Self-pay | Source: Ambulatory Visit | Attending: Orthopedic Surgery

## 2013-01-12 DIAGNOSIS — D62 Acute posthemorrhagic anemia: Secondary | ICD-10-CM

## 2013-01-12 DIAGNOSIS — Z9289 Personal history of other medical treatment: Secondary | ICD-10-CM

## 2013-01-12 DIAGNOSIS — Z96652 Presence of left artificial knee joint: Secondary | ICD-10-CM

## 2013-01-12 DIAGNOSIS — F411 Generalized anxiety disorder: Secondary | ICD-10-CM | POA: Diagnosis present

## 2013-01-12 DIAGNOSIS — I10 Essential (primary) hypertension: Secondary | ICD-10-CM | POA: Diagnosis present

## 2013-01-12 DIAGNOSIS — Z01812 Encounter for preprocedural laboratory examination: Secondary | ICD-10-CM

## 2013-01-12 DIAGNOSIS — F329 Major depressive disorder, single episode, unspecified: Secondary | ICD-10-CM | POA: Diagnosis present

## 2013-01-12 DIAGNOSIS — M1710 Unilateral primary osteoarthritis, unspecified knee: Principal | ICD-10-CM | POA: Diagnosis present

## 2013-01-12 DIAGNOSIS — E876 Hypokalemia: Secondary | ICD-10-CM

## 2013-01-12 DIAGNOSIS — E669 Obesity, unspecified: Secondary | ICD-10-CM | POA: Diagnosis present

## 2013-01-12 DIAGNOSIS — F3289 Other specified depressive episodes: Secondary | ICD-10-CM | POA: Diagnosis present

## 2013-01-12 HISTORY — PX: TOTAL KNEE ARTHROPLASTY: SHX125

## 2013-01-12 LAB — GLUCOSE, CAPILLARY

## 2013-01-12 SURGERY — ARTHROPLASTY, KNEE, TOTAL
Anesthesia: Spinal | Site: Knee | Laterality: Left | Wound class: Clean

## 2013-01-12 MED ORDER — MORPHINE SULFATE 2 MG/ML IJ SOLN
1.0000 mg | INTRAMUSCULAR | Status: DC | PRN
  Administered 2013-01-12: 1 mg via INTRAVENOUS
  Administered 2013-01-12 (×2): 2 mg via INTRAVENOUS
  Administered 2013-01-12: 1 mg via INTRAVENOUS
  Administered 2013-01-13 (×2): 2 mg via INTRAVENOUS
  Filled 2013-01-12 (×5): qty 1

## 2013-01-12 MED ORDER — POLYETHYLENE GLYCOL 3350 17 G PO PACK: 17.0000 g | PACK | Freq: Every day | ORAL | Status: DC | PRN

## 2013-01-12 MED ORDER — 0.9 % SODIUM CHLORIDE (POUR BTL) OPTIME
TOPICAL | Status: DC | PRN
  Administered 2013-01-12: 1000 mL

## 2013-01-12 MED ORDER — DEXAMETHASONE SODIUM PHOSPHATE 10 MG/ML IJ SOLN: 10.0000 mg | Freq: Once | INTRAMUSCULAR | Status: AC

## 2013-01-12 MED ORDER — HYDROCHLOROTHIAZIDE 10 MG/ML ORAL SUSPENSION
6.2500 mg | Freq: Every day | ORAL | Status: DC
  Filled 2013-01-12: qty 1.25

## 2013-01-12 MED ORDER — SERTRALINE HCL 50 MG PO TABS
50.0000 mg | ORAL_TABLET | Freq: Every morning | ORAL | Status: DC
  Administered 2013-01-13 – 2013-01-15 (×3): 50 mg via ORAL
  Filled 2013-01-12 (×3): qty 1

## 2013-01-12 MED ORDER — PHENOL 1.4 % MT LIQD
1.0000 | OROMUCOSAL | Status: DC | PRN
  Filled 2013-01-12: qty 177

## 2013-01-12 MED ORDER — HYDROMORPHONE HCL PF 1 MG/ML IJ SOLN
INTRAMUSCULAR | Status: AC
  Filled 2013-01-12: qty 1

## 2013-01-12 MED ORDER — TRAMADOL HCL 50 MG PO TABS
50.0000 mg | ORAL_TABLET | Freq: Four times a day (QID) | ORAL | Status: DC | PRN
  Administered 2013-01-12 – 2013-01-14 (×4): 100 mg via ORAL
  Filled 2013-01-12 (×4): qty 2

## 2013-01-12 MED ORDER — SODIUM CHLORIDE 0.9 % IJ SOLN
INTRAMUSCULAR | Status: DC | PRN
  Administered 2013-01-12: 50 mL

## 2013-01-12 MED ORDER — PROMETHAZINE HCL 25 MG/ML IJ SOLN: 6.2500 mg | INTRAMUSCULAR | Status: DC | PRN

## 2013-01-12 MED ORDER — SODIUM CHLORIDE 0.9 % IR SOLN
Status: DC | PRN
  Administered 2013-01-12: 1000 mL

## 2013-01-12 MED ORDER — PHENYLEPHRINE HCL 10 MG/ML IJ SOLN
INTRAMUSCULAR | Status: DC | PRN
  Administered 2013-01-12 (×6): 40 ug via INTRAVENOUS

## 2013-01-12 MED ORDER — ONDANSETRON HCL 4 MG/2ML IJ SOLN: 4.0000 mg | Freq: Four times a day (QID) | INTRAMUSCULAR | Status: DC | PRN

## 2013-01-12 MED ORDER — FENTANYL CITRATE 0.05 MG/ML IJ SOLN
INTRAMUSCULAR | Status: DC | PRN
  Administered 2013-01-12 (×2): 50 ug via INTRAVENOUS

## 2013-01-12 MED ORDER — OXYCODONE HCL 5 MG PO TABS
5.0000 mg | ORAL_TABLET | ORAL | Status: DC | PRN
Start: 2013-01-12 — End: 2013-01-15
  Administered 2013-01-12 – 2013-01-15 (×12): 10 mg via ORAL
  Administered 2013-01-15: 5 mg via ORAL
  Administered 2013-01-15: 10 mg via ORAL
  Filled 2013-01-12 (×3): qty 2
  Filled 2013-01-12: qty 1
  Filled 2013-01-12 (×10): qty 2

## 2013-01-12 MED ORDER — MIDAZOLAM HCL 5 MG/5ML IJ SOLN
INTRAMUSCULAR | Status: DC | PRN
  Administered 2013-01-12 (×2): 1 mg via INTRAVENOUS

## 2013-01-12 MED ORDER — MEPERIDINE HCL 50 MG/ML IJ SOLN: 6.2500 mg | INTRAMUSCULAR | Status: DC | PRN

## 2013-01-12 MED ORDER — ONDANSETRON HCL 4 MG/2ML IJ SOLN
INTRAMUSCULAR | Status: DC | PRN
  Administered 2013-01-12 (×2): 2 mg via INTRAVENOUS

## 2013-01-12 MED ORDER — BUPIVACAINE LIPOSOME 1.3 % IJ SUSP
20.0000 mL | Freq: Once | INTRAMUSCULAR | Status: AC
  Administered 2013-01-12: 20 mL
  Filled 2013-01-12: qty 20

## 2013-01-12 MED ORDER — BUPIVACAINE HCL (PF) 0.75 % IJ SOLN
INTRAMUSCULAR | Status: DC | PRN
  Administered 2013-01-12: 15 mg

## 2013-01-12 MED ORDER — METOCLOPRAMIDE HCL 5 MG/ML IJ SOLN: 5.0000 mg | Freq: Three times a day (TID) | INTRAMUSCULAR | Status: DC | PRN

## 2013-01-12 MED ORDER — METHOCARBAMOL 100 MG/ML IJ SOLN: 500.0000 mg | Freq: Four times a day (QID) | INTRAVENOUS | Status: DC | PRN

## 2013-01-12 MED ORDER — MENTHOL 3 MG MT LOZG
1.0000 | LOZENGE | OROMUCOSAL | Status: DC | PRN
  Filled 2013-01-12: qty 9

## 2013-01-12 MED ORDER — SODIUM CHLORIDE 0.9 % IV SOLN
INTRAVENOUS | Status: DC
  Administered 2013-01-12 – 2013-01-13 (×2): via INTRAVENOUS

## 2013-01-12 MED ORDER — PROPOFOL 10 MG/ML IV EMUL
INTRAVENOUS | Status: DC | PRN
  Administered 2013-01-12: 120 ug/kg/min via INTRAVENOUS

## 2013-01-12 MED ORDER — MEMANTINE HCL 10 MG PO TABS
10.0000 mg | ORAL_TABLET | Freq: Two times a day (BID) | ORAL | Status: DC
  Administered 2013-01-12 – 2013-01-15 (×6): 10 mg via ORAL
  Filled 2013-01-12 (×7): qty 1

## 2013-01-12 MED ORDER — SODIUM CHLORIDE 0.9 % IV SOLN: INTRAVENOUS | Status: DC

## 2013-01-12 MED ORDER — LOSARTAN POTASSIUM 50 MG PO TABS: 50.0000 mg | ORAL_TABLET | Freq: Every day | ORAL | Status: DC

## 2013-01-12 MED ORDER — ATORVASTATIN CALCIUM 20 MG PO TABS
20.0000 mg | ORAL_TABLET | Freq: Every day | ORAL | Status: DC
  Administered 2013-01-12 – 2013-01-14 (×3): 20 mg via ORAL
  Filled 2013-01-12 (×4): qty 1

## 2013-01-12 MED ORDER — METOCLOPRAMIDE HCL 10 MG PO TABS: 5.0000 mg | ORAL_TABLET | Freq: Three times a day (TID) | ORAL | Status: DC | PRN

## 2013-01-12 MED ORDER — ACETAMINOPHEN 325 MG PO TABS: 650.0000 mg | ORAL_TABLET | Freq: Four times a day (QID) | ORAL | Status: DC | PRN

## 2013-01-12 MED ORDER — METFORMIN HCL 500 MG PO TABS
500.0000 mg | ORAL_TABLET | Freq: Two times a day (BID) | ORAL | Status: DC
  Administered 2013-01-12 – 2013-01-15 (×6): 500 mg via ORAL
  Filled 2013-01-12 (×8): qty 1

## 2013-01-12 MED ORDER — ONDANSETRON HCL 4 MG PO TABS: 4.0000 mg | ORAL_TABLET | Freq: Four times a day (QID) | ORAL | Status: DC | PRN

## 2013-01-12 MED ORDER — ACETAMINOPHEN 10 MG/ML IV SOLN
1000.0000 mg | Freq: Four times a day (QID) | INTRAVENOUS | Status: AC
  Administered 2013-01-12 – 2013-01-13 (×4): 1000 mg via INTRAVENOUS
  Filled 2013-01-12 (×7): qty 100

## 2013-01-12 MED ORDER — DEXAMETHASONE 6 MG PO TABS
10.0000 mg | ORAL_TABLET | Freq: Once | ORAL | Status: AC
  Administered 2013-01-13: 10 mg via ORAL
  Filled 2013-01-12: qty 1

## 2013-01-12 MED ORDER — METHOCARBAMOL 500 MG PO TABS
500.0000 mg | ORAL_TABLET | Freq: Four times a day (QID) | ORAL | Status: DC | PRN
  Administered 2013-01-12 – 2013-01-15 (×6): 500 mg via ORAL
  Filled 2013-01-12 (×6): qty 1

## 2013-01-12 MED ORDER — OXYCODONE HCL 5 MG PO TABS: 5.0000 mg | ORAL_TABLET | Freq: Once | ORAL | Status: DC | PRN

## 2013-01-12 MED ORDER — CHLORHEXIDINE GLUCONATE 4 % EX LIQD
60.0000 mL | Freq: Once | CUTANEOUS | Status: DC
Start: 2013-01-12 — End: 2013-01-12
  Filled 2013-01-12: qty 60

## 2013-01-12 MED ORDER — BISACODYL 10 MG RE SUPP: 10.0000 mg | Freq: Every day | RECTAL | Status: DC | PRN

## 2013-01-12 MED ORDER — EPHEDRINE SULFATE 50 MG/ML IJ SOLN
INTRAMUSCULAR | Status: DC | PRN
  Administered 2013-01-12 (×3): 5 mg via INTRAVENOUS

## 2013-01-12 MED ORDER — DOCUSATE SODIUM 100 MG PO CAPS
100.0000 mg | ORAL_CAPSULE | Freq: Two times a day (BID) | ORAL | Status: DC
  Administered 2013-01-12 – 2013-01-15 (×6): 100 mg via ORAL

## 2013-01-12 MED ORDER — CEFAZOLIN SODIUM-DEXTROSE 2-3 GM-% IV SOLR
2.0000 g | INTRAVENOUS | Status: AC
  Administered 2013-01-12: 2 g via INTRAVENOUS

## 2013-01-12 MED ORDER — OXYCODONE HCL 5 MG/5ML PO SOLN
5.0000 mg | Freq: Once | ORAL | Status: DC | PRN
  Filled 2013-01-12: qty 5

## 2013-01-12 MED ORDER — ACETAMINOPHEN 650 MG RE SUPP: 650.0000 mg | Freq: Four times a day (QID) | RECTAL | Status: DC | PRN

## 2013-01-12 MED ORDER — RIVAROXABAN 10 MG PO TABS
10.0000 mg | ORAL_TABLET | Freq: Every day | ORAL | Status: DC
  Administered 2013-01-13 – 2013-01-15 (×3): 10 mg via ORAL
  Filled 2013-01-12 (×5): qty 1

## 2013-01-12 MED ORDER — LACTATED RINGERS IV SOLN
INTRAVENOUS | Status: DC | PRN
  Administered 2013-01-12 (×2): via INTRAVENOUS

## 2013-01-12 MED ORDER — ACETAMINOPHEN 10 MG/ML IV SOLN: 1000.0000 mg | Freq: Once | INTRAVENOUS | Status: DC | PRN

## 2013-01-12 MED ORDER — FLEET ENEMA 7-19 GM/118ML RE ENEM: 1.0000 | ENEMA | Freq: Once | RECTAL | Status: AC | PRN

## 2013-01-12 MED ORDER — CEFAZOLIN SODIUM 1-5 GM-% IV SOLN
1.0000 g | Freq: Four times a day (QID) | INTRAVENOUS | Status: AC
  Administered 2013-01-12 (×2): 1 g via INTRAVENOUS
  Filled 2013-01-12 (×2): qty 50

## 2013-01-12 MED ORDER — FESOTERODINE FUMARATE ER 8 MG PO TB24
8.0000 mg | ORAL_TABLET | Freq: Every day | ORAL | Status: DC
  Administered 2013-01-12 – 2013-01-14 (×3): 8 mg via ORAL
  Filled 2013-01-12 (×4): qty 1

## 2013-01-12 MED ORDER — ACETAMINOPHEN 10 MG/ML IV SOLN
1000.0000 mg | Freq: Once | INTRAVENOUS | Status: AC
  Administered 2013-01-12: 1000 mg via INTRAVENOUS

## 2013-01-12 MED ORDER — LOSARTAN POTASSIUM-HCTZ 100-12.5 MG PO TABS: 0.5000 | ORAL_TABLET | Freq: Every morning | ORAL | Status: DC

## 2013-01-12 MED ORDER — DIPHENHYDRAMINE HCL 12.5 MG/5ML PO ELIX: 12.5000 mg | ORAL_SOLUTION | ORAL | Status: DC | PRN

## 2013-01-12 MED ORDER — HYDROMORPHONE HCL PF 1 MG/ML IJ SOLN
0.2500 mg | INTRAMUSCULAR | Status: DC | PRN
  Administered 2013-01-12 (×4): 0.5 mg via INTRAVENOUS

## 2013-01-12 SURGICAL SUPPLY — 52 items
BAG ZIPLOCK 12X15 (MISCELLANEOUS) ×2 IMPLANT
BANDAGE ELASTIC 6 VELCRO ST LF (GAUZE/BANDAGES/DRESSINGS) ×2 IMPLANT
BANDAGE ESMARK 6X9 LF (GAUZE/BANDAGES/DRESSINGS) ×1 IMPLANT
BLADE SAG 18X100X1.27 (BLADE) ×2 IMPLANT
BLADE SAW SGTL 11.0X1.19X90.0M (BLADE) ×2 IMPLANT
BNDG ESMARK 6X9 LF (GAUZE/BANDAGES/DRESSINGS) ×2
BOWL SMART MIX CTS (DISPOSABLE) ×2 IMPLANT
CEMENT HV SMART SET (Cement) ×4 IMPLANT
CLOTH BEACON ORANGE TIMEOUT ST (SAFETY) ×2 IMPLANT
CUFF TOURN SGL QUICK 34 (TOURNIQUET CUFF) ×1
CUFF TRNQT CYL 34X4X40X1 (TOURNIQUET CUFF) ×1 IMPLANT
DRAPE EXTREMITY T 121X128X90 (DRAPE) ×2 IMPLANT
DRAPE POUCH INSTRU U-SHP 10X18 (DRAPES) ×2 IMPLANT
DRAPE U-SHAPE 47X51 STRL (DRAPES) ×2 IMPLANT
DRSG ADAPTIC 3X8 NADH LF (GAUZE/BANDAGES/DRESSINGS) ×2 IMPLANT
DRSG PAD ABDOMINAL 8X10 ST (GAUZE/BANDAGES/DRESSINGS) ×2 IMPLANT
DURAPREP 26ML APPLICATOR (WOUND CARE) ×2 IMPLANT
ELECT REM PT RETURN 9FT ADLT (ELECTROSURGICAL) ×2
ELECTRODE REM PT RTRN 9FT ADLT (ELECTROSURGICAL) ×1 IMPLANT
EVACUATOR 1/8 PVC DRAIN (DRAIN) ×2 IMPLANT
FACESHIELD LNG OPTICON STERILE (SAFETY) ×10 IMPLANT
GLOVE BIO SURGEON STRL SZ7.5 (GLOVE) ×2 IMPLANT
GLOVE BIO SURGEON STRL SZ8 (GLOVE) ×2 IMPLANT
GLOVE BIOGEL PI IND STRL 8 (GLOVE) ×2 IMPLANT
GLOVE BIOGEL PI INDICATOR 8 (GLOVE) ×2
GOWN STRL NON-REIN LRG LVL3 (GOWN DISPOSABLE) ×4 IMPLANT
GOWN STRL REIN XL XLG (GOWN DISPOSABLE) ×4 IMPLANT
HANDPIECE INTERPULSE COAX TIP (DISPOSABLE) ×1
IMMOBILIZER KNEE 20 (SOFTGOODS) ×2
IMMOBILIZER KNEE 20 THIGH 36 (SOFTGOODS) ×1 IMPLANT
KIT BASIN OR (CUSTOM PROCEDURE TRAY) ×2 IMPLANT
MANIFOLD NEPTUNE II (INSTRUMENTS) ×2 IMPLANT
NDL SAFETY ECLIPSE 18X1.5 (NEEDLE) ×1 IMPLANT
NEEDLE HYPO 18GX1.5 SHARP (NEEDLE) ×1
NS IRRIG 1000ML POUR BTL (IV SOLUTION) ×2 IMPLANT
PACK TOTAL JOINT (CUSTOM PROCEDURE TRAY) ×2 IMPLANT
PADDING CAST COTTON 6X4 STRL (CAST SUPPLIES) ×2 IMPLANT
POSITIONER SURGICAL ARM (MISCELLANEOUS) ×2 IMPLANT
SET HNDPC FAN SPRY TIP SCT (DISPOSABLE) ×1 IMPLANT
SPONGE GAUZE 4X4 12PLY (GAUZE/BANDAGES/DRESSINGS) ×2 IMPLANT
STRIP CLOSURE SKIN 1/2X4 (GAUZE/BANDAGES/DRESSINGS) ×4 IMPLANT
SUCTION FRAZIER 12FR DISP (SUCTIONS) ×2 IMPLANT
SUT MNCRL AB 4-0 PS2 18 (SUTURE) ×2 IMPLANT
SUT VIC AB 2-0 CT1 27 (SUTURE) ×3
SUT VIC AB 2-0 CT1 TAPERPNT 27 (SUTURE) ×3 IMPLANT
SUT VLOC 180 0 24IN GS25 (SUTURE) ×2 IMPLANT
SYR 20CC LL (SYRINGE) ×2 IMPLANT
SYR 50ML LL SCALE MARK (SYRINGE) ×2 IMPLANT
TOWEL OR 17X26 10 PK STRL BLUE (TOWEL DISPOSABLE) ×4 IMPLANT
TRAY FOLEY CATH 14FRSI W/METER (CATHETERS) ×2 IMPLANT
WATER STERILE IRR 1500ML POUR (IV SOLUTION) ×4 IMPLANT
WRAP KNEE MAXI GEL POST OP (GAUZE/BANDAGES/DRESSINGS) ×2 IMPLANT

## 2013-01-12 NOTE — Addendum Note (Signed)
Addendum created 01/12/13 1123 by Edison Pace, CRNA   Modules edited: Anesthesia Medication Administration

## 2013-01-12 NOTE — Anesthesia Postprocedure Evaluation (Signed)
Anesthesia Post Note  Patient: Adrienne [name]  Procedure(s) Performed: Procedure(s) (LRB): TOTAL KNEE ARTHROPLASTY (Left)  Anesthesia type: Spinal  Patient location: PACU  Post pain: Pain level controlled  Post assessment: Post-op Vital signs reviewed  Last Vitals: BP 132/49  Pulse 69  Temp(Src) 36.4 C (Oral)  Resp 12  SpO2 100%  Post vital signs: Reviewed  Level of consciousness: sedated  Complications: No apparent anesthesia complications

## 2013-01-12 NOTE — Op Note (Signed)
Pre-operative diagnosis- Osteoarthritis  Left knee(s)  Post-operative diagnosis- Osteoarthritis Left knee(s)  Procedure-  Left  Total Knee Arthroplasty  Surgeon- Gus Rankin. Darshan Solanki, MD  Assistant- Avel Peace, PA-C   Anesthesia-  Spinal EBL-* No blood loss amount entered *  Drains Hemovac  Tourniquet time-  Total Tourniquet Time Documented: Thigh (Left) - 31 minutes Total: Thigh (Left) - 31 minutes    Complications- None  Condition-PACU - hemodynamically stable.   Brief Clinical Note  Adrienne [name] is a 77 y.o. year old female with end stage OA of her left knee with progressively worsening pain and dysfunction. She has constant pain, with activity and at rest and significant functional deficits with difficulties even with ADLs. She has had extensive non-op management including analgesics, injections of cortisone and viscosupplements, and home exercise program, but remains in significant pain with significant dysfunction. Radiographs show bone on bone arthritis medial and patellofemoral. She presents now for left Total Knee Arthroplasty.    Procedure in detail---   The patient is brought into the operating room and positioned supine on the operating table. After successful administration of  Spinal,   a tourniquet is placed high on the  Left thigh(s) and the lower extremity is prepped and draped in the usual sterile fashion. Time out is performed by the operating team and then the  Left lower extremity is wrapped in Esmarch, knee flexed and the tourniquet inflated to 300 mmHg.       A midline incision is made with a ten blade through the subcutaneous tissue to the level of the extensor mechanism. A fresh blade is used to make a medial parapatellar arthrotomy. Soft tissue over the proximal medial tibia is subperiosteally elevated to the joint line with a knife and into the semimembranosus bursa with a Cobb elevator. Soft tissue over the proximal lateral tibia is elevated with attention  being paid to avoiding the patellar tendon on the tibial tubercle. The patella is everted, knee flexed 90 degrees and the ACL and PCL are removed. Findings are bone on bone medial and patellofemoral with large medial osteophytes.        The drill is used to create a starting hole in the distal femur and the canal is thoroughly irrigated with sterile saline to remove the fatty contents. The 5 degree Left  valgus alignment guide is placed into the femoral canal and the distal femoral cutting block is pinned to remove 10 mm off the distal femur. Resection is made with an oscillating saw.      The tibia is subluxed forward and the menisci are removed. The extramedullary alignment guide is placed referencing proximally at the medial aspect of the tibial tubercle and distally along the second metatarsal axis and tibial crest. The block is pinned to remove 2mm off the more deficient medial  side. Resection is made with an oscillating saw. Size 3is the most appropriate size for the tibia and the proximal tibia is prepared with the modular drill and keel punch for that size.      The femoral sizing guide is placed and size 4 narrow is most appropriate. Rotation is marked off the epicondylar axis and confirmed by creating a rectangular flexion gap at 90 degrees. The size 4 cutting block is pinned in this rotation and the anterior, posterior and chamfer cuts are made with the oscillating saw. The intercondylar block is then placed and that cut is made.      Trial size 3 tibial component, trial size  4 narrow posterior stabilized femur and a 10  mm posterior stabilized rotating platform insert trial is placed. Full extension is achieved with excellent varus/valgus and anterior/posterior balance throughout full range of motion. The patella is everted and thickness measured to be 23  mm. Free hand resection is taken to 13 mm, a 35 template is placed, lug holes are drilled, trial patella is placed, and it tracks normally.  Osteophytes are removed off the posterior femur with the trial in place. All trials are removed and the cut bone surfaces prepared with pulsatile lavage. Cement is mixed and once ready for implantation, the size 3 tibial implant, size  4 narrow posterior stabilized femoral component, and the size 35 patella are cemented in place and the patella is held with the clamp. The trial insert is placed and the knee held in full extension. The Exparel (20 ml mixed with 50 ml saline) is injected into the extensor mechanism, posterior capsule, medial and lateral gutters and subcutaneous tissues.  All extruded cement is removed and once the cement is hard the permanent 10 mm posterior stabilized rotating platform insert is placed into the tibial tray.      The wound is copiously irrigated with saline solution and the extensor mechanism closed over a hemovac drain with #1 PDS suture. The tourniquet is released for a total tourniquet time of 31  minutes. Flexion against gravity is 140 degrees and the patella tracks normally. Subcutaneous tissue is closed with 2.0 vicryl and subcuticular with running 4.0 Monocryl. The incision is cleaned and dried and steri-strips and a bulky sterile dressing are applied. The limb is placed into a knee immobilizer and the patient is awakened and transported to recovery in stable condition.      Please note that a surgical assistant was a medical necessity for this procedure in order to perform it in a safe and expeditious manner. Surgical assistant was necessary to retract the ligaments and vital neurovascular structures to prevent injury to them and also necessary for proper positioning of the limb to allow for anatomic placement of the prosthesis.   Gus Rankin Adrienne Pinnock, MD    01/12/2013, 8:26 AM

## 2013-01-12 NOTE — Transfer of Care (Signed)
Immediate Anesthesia Transfer of Care Note  Patient: Adrienne [name]  Procedure(s) Performed: Procedure(s): TOTAL KNEE ARTHROPLASTY (Left)  Patient Location: PACU  Anesthesia Type:Spinal  Level of Consciousness: awake, alert , oriented and patient cooperative  Airway & Oxygen Therapy: Patient Spontanous Breathing and Patient connected to face mask oxygen  Post-op Assessment: Report given to PACU RN and Post -op Vital signs reviewed and stable  Post vital signs: Reviewed and stable  Complications: No apparent anesthesia complications

## 2013-01-12 NOTE — Progress Notes (Signed)
Clinical Social Work Department BRIEF PSYCHOSOCIAL ASSESSMENT 01/12/2013  Patient:  Adrienne [name], Adrienne [name]     Account Number:  [id number]     Admit date:  01/12/2013  Clinical Social Worker:  Candie Chroman  Date/Time:  01/12/2013 02:35 PM  Referred by:  Physician  Date Referred:  01/12/2013 Referred for  SNF Placement   Other Referral:   Interview type:  Patient Other interview type:    PSYCHOSOCIAL DATA Living Status:  ALONE Admitted from facility:   Level of care:   Primary support name:  [name] Primary support relationship to patient:  CHILD, ADULT Degree of support available:   supportive    CURRENT CONCERNS Current Concerns  Post-Acute Placement   Other Concerns:    SOCIAL WORK ASSESSMENT / PLAN Pt is a 77 yr old female living at home prior to hospitalization. CSW met with pt / daughter to assist with d/c planning. Pt has made prior arrangements to have ST Rehab at [facility] following hospital d/c. CSW has contacted SNF and d/c plan has been confirmed. CSW will follow to assist with d/c planning needs.   Assessment/plan status:  Psychosocial Support/Ongoing Assessment of Needs Other assessment/ plan:   Information/referral to community resources:   None needed at this time.    PATIENT'S/FAMILY'S RESPONSE TO PLAN OF CARE: Pt is looking forward to feeling better and having rehab at [facility].    Cori Razor LCSW [phone]

## 2013-01-12 NOTE — Preoperative (Signed)
Beta Blockers   Reason not to administer Beta Blockers:Not Applicable 

## 2013-01-12 NOTE — Plan of Care (Signed)
Problem: Consults Goal: Diagnosis- Total Joint Replacement Left total knee     

## 2013-01-12 NOTE — Progress Notes (Signed)
Utilization review completed.  

## 2013-01-12 NOTE — Progress Notes (Signed)
Clinical Social Work Department CLINICAL SOCIAL WORK PLACEMENT NOTE 01/12/2013  Patient:  Adrienne [name], Adrienne [name]  Account Number:  [id number] Admit date:  01/12/2013  Clinical Social Worker:  Cori Razor, LCSW  Date/time:  01/12/2013 02:41 PM  Clinical Social Work is seeking post-discharge placement for this patient at the following level of care:   SKILLED NURSING   (*CSW will update this form in Epic as items are completed)     Patient/family provided with [facility] Department of Clinical Social Work's list of facilities offering this level of care within the geographic area requested by the patient (or if unable, by the patient's family).  01/12/2013  Patient/family informed of their freedom to choose among providers that offer the needed level of care, that participate in Medicare, Medicaid or managed care program needed by the patient, have an available bed and are willing to accept the patient.    Patient/family informed of [facility]' ownership interest in [facility], as well as of the fact that they are under no obligation to receive care at this facility.  PASARR submitted to EDS on  PASARR number received from [employer] on 04/24/2012  FL2 transmitted to all facilities in geographic area requested by pt/family on  01/12/2013 FL2 transmitted to all facilities within larger geographic area on   Patient informed that his/her managed care company has contracts with or will negotiate with  certain facilities, including the following:     Patient/family informed of bed offers received:  01/12/2013 Patient chooses bed at [facility] AND [facility] Physician recommends and patient chooses bed at    Patient to be transferred to [facility] AND REHAB on   Patient to be transferred to facility by   The following physician request were entered in Epic:   Additional Comments:  Cori Razor LCSW [phone]

## 2013-01-12 NOTE — Progress Notes (Signed)
PT NOTE  Order received. Chart reviewed. Pt not ready for POD 0 PT eval on this afternoon per RN. Will check back on tomorrow. Thanks. [name], PT [phone]

## 2013-01-12 NOTE — H&P (View-Only) (Signed)
Adrienne [name]  DOB: 10/02/1936 Married / Language: English / Race: White Female  Date of Admission:  01/12/2013  Chief Complaint:  Left Knee Pain  History of Present Illness The patient is a 77 year old female who comes in for a preoperative History and Physical. The patient is scheduled for a left total knee arthroplasty to be performed by Dr. Frank V. Aluisio, MD at [facility] on 01/12/2013. The patient is being followed for their left knee pain and osteoarthritis. They are out from from a Synvisc series. Symptoms reported today include: pain, pain with weightbearing and difficulty arising from chair. and report their pain level to be moderate. The following medication has been used for pain control: Tramadol. Note for "Follow-up Knee": She states the Synvisc helped a little, but she continues to have pain. Right knee is doing very well at this time. She is very pleased with that. The majority of her problem now is with the left knee. She has failed cortisone and visco supplements. She said it is hurting at all times. She is ready to proceed with the left total knee replacement. They have been treated conservatively in the past for the above stated problem and despite conservative measures, they continue to have progressive pain and severe functional limitations and dysfunction. They have failed non-operative management including home exercise, medications. It is felt that they would benefit from undergoing total joint replacement. Risks and benefits of the procedure have been discussed with the patient and they elect to proceed with surgery. There are no active contraindications to surgery such as ongoing infection or rapidly progressive neurological disease.  Problem List Primary osteoarthritis of one knee (715.16) S/P Right total knee arthroplasty (V43.65)   Allergies Sulfathiazole *Sulfonamides** Ibuprofen *ANALGESICS - ANTI-INFLAMMATORY*   Family  History Mother. Deceased, Colon Cancer. age 94 Hypertension. mother Father. Deceased. Cancer. mother   Social History Living situation. live alone Marital status. married Pain Contract. no Illicit drug use. no Previously in rehab. no Alcohol use. never consumed alcohol Tobacco use. never smoker Children. 1 Current work status. retired Exercise. Exercises never Drug/Alcohol Rehab (Currently). no Advance Directives. Living Will, Healthcare POA Post-Surgical Plans. Plan is to go to [facility] after the hospital stay.   Medication History Namenda (10MG Tablet, Oral) Active. Atorvastatin Calcium (20MG Tablet, Oral) Active. Cephalexin (250MG Capsule, Oral) Active. Aspirin EC (81MG Tablet DR, Oral) Active. Vitamin D (1000UNIT Capsule, Oral) Active. TraMADol HCl (50MG Tablet, Oral) Active. Centrum Silver ( Oral) Active. Acetaminophen (500MG Capsule, Oral) Active. Losartan Potassium-HCTZ (100-25MG Tablet, Oral) Active. MetFORMIN HCl (500MG Tablet, Oral) Active. Sertraline HCl (50MG Tablet, Oral) Active. Caltrate 600+D (600-400MG-UNIT Tablet Chewable, Oral) Active. Toviaz (8MG Tablet ER 24HR, Oral) Active.   Past Surgical History Hysterectomy. complete (non-cancerous) Thyroidectomy; Total   Medical History Goiter Obesity Chronic Low Back Pain Allergic Rhinitis Chronic Neck Pain Microhematuria Depression Anxiety Disorder Diabetes Mellitus, Type II Urinary Frequency Osteopenia History of Chronic Urethritis Multiple Colonic Polyps History of Fatty Liver Cataract. Bilateral Osteoarthritis Sleep Apnea High blood pressure    Vitals Weight: 203 lb Height: 63 in Weight was reported by patient. Height was reported by patient. Body Surface Area: 2.02 m Body Mass Index: 35.96 kg/m Pulse: 84 (Regular) Resp.: 14 (Unlabored) BP: 106/58 (Sitting, Left Arm, Standard)    Physical Exam The physical exam findings are as  follows:  Note: Patient is a 77 year old female with continued left knee pain. Patient is accompanied today by her daughter.   General Mental Status -   Alert, cooperative and good historian. General Appearance- pleasant. Not in acute distress. Orientation- Oriented X3. Build & Nutrition- Well nourished and Well developed.   Head and Neck Head- normocephalic, atraumatic . Neck Global Assessment- supple. no bruit auscultated on the right and no bruit auscultated on the left.   Eye Vision- Wears corrective lenses. Pupil- Bilateral- Regular and Round. Motion- Bilateral- EOMI.   Chest and Lung Exam Auscultation: Breath sounds:- clear at anterior chest wall and - clear at posterior chest wall. Adventitious sounds:- No Adventitious sounds.   Cardiovascular Auscultation:Rhythm- Regular rate and rhythm. Heart Sounds- S1 WNL and S2 WNL. Murmurs & Other Heart Sounds:Auscultation of the heart reveals - No Murmurs.   Abdomen Inspection:Contour- Generalized moderate distention. Palpation/Percussion:Tenderness- Abdomen is non-tender to palpation. Rigidity (guarding)- Abdomen is soft. Auscultation:Auscultation of the abdomen reveals - Bowel sounds normal.   Female Genitourinary Not done, not pertinent to present illness  Musculoskeletal On exam she is alert and oriented in no apparent distress. Her right knee looks great. There is no swelling at all in the right knee. Range is about 0 to 118 degrees. There is no instability noted about the right knee. Her left knee shows no effusion. There is marked crepitus on range of motion of the left knee. Range 5 to 120. She is tender medial greater than lateral side of the knee. She does have some swelling on the distal lateral leg and ankle. She is tender on the medial and lateral left ankle.  RADIOGRAPHS: AP and lateral of both knees show advanced end stage arthritis of the left knee bone on bone throughout with  large osteophytes. Right knee shows prosthesis in excellent alignment with no periprosthetic abnormalities.  Assessment & Plan Primary osteoarthritis of one knee (715.16) Impression: Left Knee  Note: Plan is for a Left Total Knee Replacement by Dr. Aluisio.  Plan is to go to Rehab at [facility] following the hospital stay.  Signed electronically by DREW L PERKINS, PA-C  

## 2013-01-12 NOTE — Anesthesia Procedure Notes (Signed)
Spinal  Patient location during procedure: OR Staffing Anesthesiologist: Lewie Loron R Performed by: anesthesiologist  Preanesthetic Checklist Completed: patient identified, site marked, surgical consent, pre-op evaluation, timeout performed, IV checked, risks and benefits discussed and monitors and equipment checked Spinal Block Patient position: sitting Prep: Betadine Patient monitoring: heart rate, continuous pulse ox and blood pressure Approach: left paramedian Location: L3-4 Injection technique: single-shot Needle Needle type: Sprotte  Needle gauge: 22 G Needle length: 9 cm Assessment Sensory level: T8 Additional Notes Expiration date of kit checked and confirmed. Patient tolerated procedure well, without complications.

## 2013-01-12 NOTE — Interval H&P Note (Signed)
History and Physical Interval Note:  01/12/2013 7:10 AM  Adrienne [name]  has presented today for surgery, with the diagnosis of Osteoarthritis OF LEFT KNEE  The various methods of treatment have been discussed with the patient and family. After consideration of risks, benefits and other options for treatment, the patient has consented to  Procedure(s): TOTAL KNEE ARTHROPLASTY (Left) as a surgical intervention .  The patient's history has been reviewed, patient examined, no change in status, stable for surgery.  I have reviewed the patient's chart and labs.  Questions were answered to the patient's satisfaction.     Loanne Drilling

## 2013-01-12 NOTE — Anesthesia Preprocedure Evaluation (Addendum)
Anesthesia Evaluation  Patient identified by MRN, date of birth, ID band Patient awake    Reviewed: Allergy & Precautions, H&P , NPO status , Patient's Chart, lab work & pertinent test results  Airway Mallampati: II TM Distance: >3 FB Neck ROM: Full    Dental no notable dental hx. (+) Dental Advisory Given   Pulmonary shortness of breath, sleep apnea ,  breath sounds clear to auscultation  Pulmonary exam normal       Cardiovascular hypertension, Pt. on medications negative cardio ROS  Rhythm:Regular Rate:Normal  Normal nuclear stress test Jan 2012 No ischemia. EF 81%    Neuro/Psych PSYCHIATRIC DISORDERS Anxiety Depression negative neurological ROS     GI/Hepatic negative GI ROS, Neg liver ROS,   Endo/Other  diabetes, Type 2, Oral Hypoglycemic AgentsHypothyroidism   Renal/GU negative Renal ROS     Musculoskeletal negative musculoskeletal ROS (+)   Abdominal   Peds  Hematology negative hematology ROS (+)   Anesthesia Other Findings   Reproductive/Obstetrics negative OB ROS                           Anesthesia Physical  Anesthesia Plan  ASA: III  Anesthesia Plan: Spinal   Post-op Pain Management:    Induction: Intravenous  Airway Management Planned:   Additional Equipment:   Intra-op Plan:   Post-operative Plan:   Informed Consent: I have reviewed the patients History and Physical, chart, labs and discussed the procedure including the risks, benefits and alternatives for the proposed anesthesia with the patient or authorized representative who has indicated his/her understanding and acceptance.   Dental advisory given  Plan Discussed with: CRNA  Anesthesia Plan Comments:        Anesthesia Quick Evaluation

## 2013-01-13 LAB — CBC
HCT: 29.1 % — ABNORMAL LOW (ref 36.0–46.0)
MCV: 92.7 fL (ref 78.0–100.0)
Platelets: 246 10*3/uL (ref 150–400)
RBC: 3.14 MIL/uL — ABNORMAL LOW (ref 3.87–5.11)
WBC: 9.5 10*3/uL (ref 4.0–10.5)

## 2013-01-13 LAB — BASIC METABOLIC PANEL
BUN: 12 mg/dL (ref 6–23)
CO2: 25 mEq/L (ref 19–32)
Chloride: 101 mEq/L (ref 96–112)
Creatinine, Ser: 0.64 mg/dL (ref 0.50–1.10)
Potassium: 3.5 mEq/L (ref 3.5–5.1)

## 2013-01-13 MED ORDER — LOSARTAN POTASSIUM 50 MG PO TABS
100.0000 mg | ORAL_TABLET | Freq: Every day | ORAL | Status: DC
  Administered 2013-01-14 – 2013-01-15 (×2): 100 mg via ORAL
  Filled 2013-01-13 (×2): qty 2

## 2013-01-13 MED ORDER — HYDROCHLOROTHIAZIDE 25 MG PO TABS
25.0000 mg | ORAL_TABLET | Freq: Every day | ORAL | Status: DC
  Administered 2013-01-14 – 2013-01-15 (×2): 25 mg via ORAL
  Filled 2013-01-13 (×2): qty 1

## 2013-01-13 NOTE — Progress Notes (Signed)
   Subjective: 1 Day Post-Op Procedure(s) (LRB): TOTAL KNEE ARTHROPLASTY (Left)   Patient reports pain as moderate. Pt sitting in bed trying to get comfortable C/o moderate pain to left knee  Objective:   VITALS:   Filed Vitals:   01/13/13 0510  BP: 146/75  Pulse: 106  Temp: 98 F (36.7 C)  Resp: 18   Drain pulled Dressing intact to left knee Neurologically intact  LABS  Recent Labs  01/13/13 0447  HGB 9.4*  HCT 29.1*  WBC 9.5  PLT 246     Recent Labs  01/13/13 0447  NA 136  K 3.5  BUN 12  CREATININE 0.64  GLUCOSE 126*     Assessment/Plan: 1 Day Post-Op Procedure(s) (LRB): TOTAL KNEE ARTHROPLASTY (Left)  PT/OT Pulmonary toilet Pain control D/c planning for Monday or Tuesday Advance diet   Brad Jasim Harari, MPAS, PA-C  01/13/2013, 7:41 AM

## 2013-01-13 NOTE — Progress Notes (Signed)
Pt complaining of pain in lower left leg below ace wrap. Swelling noted. Pedal pulse strong in left foot. Removed ace wrap and surgical dressing to assist fluid with redistribution. Scant drainage noted on surgical dsg. Fresh gauze and tape applied to incision. Ice applied to lower leg to assist with swelling.

## 2013-01-13 NOTE — Evaluation (Signed)
Physical Therapy Evaluation Patient Details Name: Adrienne [name] MRN: [id number] DOB: 06-01-1936 Today's Date: 01/13/2013 Time: 8295-6213 PT Time Calculation (min): 41 min  PT Assessment / Plan / Recommendation Clinical Impression  pt is s/p Left TKA and will benefit from PT to improve independence and functional mobility skills. Pt will need post acute rehab    PT Assessment  Patient needs continued PT services    Follow Up Recommendations  SNF    Does the patient have the potential to tolerate intense rehabilitation      Barriers to Discharge        Equipment Recommendations  None recommended by PT    Recommendations for Other Services     Frequency 7X/week    Precautions / Restrictions Precautions Precautions: Knee Required Braces or Orthoses: Other Brace/Splint;Knee Immobilizer - Left Knee Immobilizer - Left: Discontinue once straight leg raise with < 10 degree lag Other Brace/Splint: ASO left ankle when OOB Restrictions LLE Weight Bearing: Weight bearing as tolerated   Pertinent Vitals/Pain 8/10 left knee; premedicated      Mobility  Bed Mobility Bed Mobility: Supine to Sit Supine to Sit: 1: +2 Total assist Supine to Sit: Patient Percentage: 20% Details for Bed Mobility Assistance: +2 for UB/LE assist; multi-modal cues for technique and self assist Transfers Transfers: Sit to Stand;Stand to Sit;Stand Pivot Transfers Sit to Stand: 1: +2 Total assist Sit to Stand: Patient Percentage: 30% Stand to Sit: 1: +2 Total assist Stand to Sit: Patient Percentage: 30% Stand Pivot Transfers: 1: +2 Total assist Stand Pivot Transfers: Patient Percentage: 30% Details for Transfer Assistance: multi-modal cues for all aspects/sequence  of transfers; +2 for wt hsift, safety, balance Ambulation/Gait Ambulation/Gait Assistance: Not tested (comment)    Exercises Total Joint Exercises Ankle Circles/Pumps: AROM;10 reps;Both Quad Sets: AROM;Both;5 reps   PT Diagnosis:  Difficulty walking  PT Problem List: Decreased strength;Decreased range of motion;Decreased activity tolerance;Decreased balance;Decreased mobility;Decreased knowledge of precautions;Decreased knowledge of use of DME;Obesity PT Treatment Interventions: DME instruction;Gait training;Functional mobility training;Therapeutic activities;Therapeutic exercise;Patient/family education   PT Goals Acute Rehab PT Goals PT Goal Formulation: With patient Time For Goal Achievement: 01/20/13 Potential to Achieve Goals: Good Pt will go Supine/Side to Sit: with min assist PT Goal: Supine/Side to Sit - Progress: Goal set today Pt will go Sit to Supine/Side: with min assist PT Goal: Sit to Supine/Side - Progress: Goal set today Pt will go Sit to Stand: with min assist PT Goal: Sit to Stand - Progress: Goal set today Pt will go Stand to Sit: with min assist PT Goal: Stand to Sit - Progress: Goal set today Pt will Ambulate: 16 - 50 feet;with min assist;with rolling walker PT Goal: Ambulate - Progress: Goal set today  Visit Information  Last PT Received On: 01/13/13 Assistance Needed: +2    Subjective Data  Subjective: ok Patient Stated Goal: rehab   Prior Functioning  Home Living Lives With: Alone Home Adaptive Equipment: Walker - rolling Additional Comments: husband is in rehab at [facility] Prior Function Level of Independence: Independent Communication Communication: No difficulties    Cognition  Cognition Overall Cognitive Status: Appears within functional limits for tasks assessed/performed Arousal/Alertness: Awake/alert Orientation Level: Appears intact for tasks assessed Behavior During Session: [facility] for tasks performed    Extremity/Trunk Assessment Right Lower Extremity Assessment RLE ROM/Strength/Tone: [facility] for tasks assessed Left Lower Extremity Assessment LLE ROM/Strength/Tone: Deficits;Due to pain LLE ROM/Strength/Tone Deficits: ankle limited due to pain (constant and  present prior to surgery per pt) able to assist  minimally wiith SLR   Balance Dynamic Standing Balance Dynamic Standing - Balance Support: During functional activity;Bilateral upper extremity supported Dynamic Standing - Level of Assistance: 1: +2 Total assist  End of Session PT - End of Session Equipment Utilized During Treatment: Gait belt;Left knee immobilizer Activity Tolerance: Patient limited by fatigue;Patient limited by pain Patient left: in chair;with call bell/phone within reach Nurse Communication: Mobility status  GP     [facility] 01/13/2013, 12:45 PM

## 2013-01-13 NOTE — Progress Notes (Signed)
01/13/13 1600  PT Visit Information  Last PT Received On 01/13/13  Assistance Needed +2  PT Time Calculation  PT Start Time 1430  PT Stop Time 1502  PT Time Calculation (min) 32 min  Subjective Data  Subjective I'm sorry  Precautions  Precautions Knee  Required Braces or Orthoses Other Brace/Splint;Knee Immobilizer - Left  Knee Immobilizer - Left Discontinue once straight leg raise with < 10 degree lag  Other Brace/Splint ASO left ankle when OOB  Restrictions  LLE Weight Bearing WBAT  Cognition  Overall Cognitive Status Appears within functional limits for tasks assessed/performed  Arousal/Alertness Awake/alert  Orientation Level Appears intact for tasks assessed  Behavior During Session Anxious  Cognition - Other Comments fearful, painful Left ankle more so than knee; RN aware  Bed Mobility  Bed Mobility Sit to Supine  Sit to Supine 1: +2 Total assist  Sit to Supine: Patient Percentage 40%  Details for Bed Mobility Assistance +2 for UB/LE assist; multi-modal cues for technique and self assist  Transfers  Transfers Sit to Stand;Stand to Sit;Stand Pivot Transfers  Sit to Stand 1: +2 Total assist  Sit to Stand: Patient Percentage 30%  Stand to Sit 1: +2 Total assist  Stand to Sit: Patient Percentage 30%  Stand Pivot Transfers 1: +2 Total assist  Stand Pivot Transfers: Patient Percentage 30%  Details for Transfer Assistance multi-modal cues for all aspects/sequence  of transfers; +2 for wt hsift, safety, balance  Ambulation/Gait  Ambulation/Gait Assistance Not tested (comment)  Dynamic Standing Balance  Dynamic Standing - Balance Support During functional activity;Bilateral upper extremity supported  Dynamic Standing - Level of Assistance 1: +2 Total assist  PT - End of Session  Equipment Utilized During Treatment Gait belt;Left knee immobilizer;Other (comment) (ASO)  Activity Tolerance Patient limited by fatigue;Patient limited by pain  Patient left in bed;with call  bell/phone within reach  Nurse Communication Mobility status  PT - Assessment/Plan  Comments on Treatment Session pt making slow progress; very painful left ankle;  ASO removed once pt in bed; ankle painful to touch, AROM, PROM and with [facility]  PT Plan Discharge plan remains appropriate;Frequency remains appropriate  PT Frequency 7X/week  Follow Up Recommendations SNF  PT equipment None recommended by PT  Acute Rehab PT Goals  Time For Goal Achievement 01/20/13  Potential to Achieve Goals Good  Pt will go Sit to Supine/Side with min assist  PT Goal: Sit to Supine/Side - Progress Progressing toward goal  Pt will go Sit to Stand with min assist  PT Goal: Sit to Stand - Progress Progressing toward goal  Pt will go Stand to Sit with min assist  PT Goal: Stand to Sit - Progress Progressing toward goal  Pt will Ambulate 16 - 50 feet;with min assist;with rolling walker  PT Goal: Ambulate - Progress Progressing toward goal  PT General Charges  $$ ACUTE PT VISIT 1 Procedure  PT Treatments  $Therapeutic Activity 23-37 mins

## 2013-01-14 LAB — BASIC METABOLIC PANEL
CO2: 26 mEq/L (ref 19–32)
Chloride: 101 mEq/L (ref 96–112)
Creatinine, Ser: 0.6 mg/dL (ref 0.50–1.10)
GFR calc Af Amer: >90 mL/min (ref >90)
Potassium: 3.4 mEq/L — ABNORMAL LOW (ref 3.5–5.1)
Sodium: 138 mEq/L (ref 135–145)

## 2013-01-14 LAB — CBC
MCV: 92.3 fL (ref 78.0–100.0)
Platelets: 238 10*3/uL (ref 150–400)
RBC: 2.73 MIL/uL — ABNORMAL LOW (ref 3.87–5.11)
RDW: 13.7 % (ref 11.5–15.5)
WBC: 12.5 10*3/uL — ABNORMAL HIGH (ref 4.0–10.5)

## 2013-01-14 NOTE — Progress Notes (Signed)
Physical Therapy Treatment Patient Details Name: Adrienne [name] MRN: [id number] DOB: [birth year] Today's Date: 01/14/2013 Time: 1340-1407 PT Time Calculation (min): 27 min  PT Assessment / Plan / Recommendation Comments on Treatment Session  pt better today but continues to progress slowly; will  benefit from post acute rehab    Follow Up Recommendations  SNF     Does the patient have the potential to tolerate intense rehabilitation     Barriers to Discharge        Equipment Recommendations  None recommended by PT    Recommendations for Other Services    Frequency 7X/week   Plan Discharge plan remains appropriate;Frequency remains appropriate    Precautions / Restrictions Precautions Precautions: Knee Required Braces or Orthoses: Other Brace/Splint;Knee Immobilizer - Left Knee Immobilizer - Left: Discontinue once straight leg raise with < 10 degree lag Other Brace/Splint: ASO left ankle when OOB Restrictions LLE Weight Bearing: Weight bearing as tolerated   Pertinent Vitals/Pain "hurts", not rated Left knee    Mobility  Bed Mobility Bed Mobility: Supine to Sit;Sitting - Scoot to Edge of Bed Supine to Sit: 3: Mod assist;With rails;HOB elevated Sitting - Scoot to Edge of Bed: 3: Mod assist;With rail Details for Bed Mobility Assistance: cues for use of UEs/self assist; requires increased time Transfers Transfers: Sit to Stand;Stand to Sit Sit to Stand: 3: Mod assist;With upper extremity assist;From bed;From elevated surface Stand to Sit: 4: Min assist;3: Mod assist;With upper extremity assist;To chair/3-in-1 Details for Transfer Assistance: cues for hand placement Ambulation/Gait Ambulation/Gait Assistance: 4: Min assist Ambulation Distance (Feet): 22 Feet Assistive device: Rolling walker Ambulation/Gait Assistance Details: cues for sequence, RW safetyand posture Gait Pattern: Step-to pattern;Decreased step length - right;Decreased step length - left General Gait  Details: slow but improved from yesterday    Exercises Total Joint Exercises Ankle Circles/Pumps: AROM;10 reps;Both Quad Sets: AROM;Both;10 reps Heel Slides: AROM;Left;10 reps;AAROM Hip ABduction/ADduction: AROM;AAROM;Both;10 reps Straight Leg Raises: AAROM;Left;10 reps   PT Diagnosis:    PT Problem List:   PT Treatment Interventions:     PT Goals Acute Rehab PT Goals Time For Goal Achievement: 01/20/13 Potential to Achieve Goals: Good Pt will go Supine/Side to Sit: with min assist PT Goal: Supine/Side to Sit - Progress: Progressing toward goal Pt will go Sit to Stand: with min assist PT Goal: Sit to Stand - Progress: Progressing toward goal Pt will go Stand to Sit: with min assist PT Goal: Stand to Sit - Progress: Progressing toward goal Pt will Ambulate: 16 - 50 feet;with min assist;with rolling walker PT Goal: Ambulate - Progress: Progressing toward goal  Visit Information  Last PT Received On: 01/14/13 Assistance Needed: +1    Subjective Data  Subjective: It hurts Patient Stated Goal: rehab   Cognition  Cognition Overall Cognitive Status: Impaired Area of Impairment: Memory;Safety/judgement;Awareness of errors;Awareness of deficits Arousal/Alertness: Awake/alert Orientation Level: Appears intact for tasks assessed Behavior During Session: Anxious Memory: Decreased recall of precautions Safety/Judgement: Decreased safety judgement for tasks assessed Awareness of Errors: Assistance required to identify errors made;Assistance required to correct errors made    Balance     End of Session PT - End of Session Equipment Utilized During Treatment: Gait belt;Left knee immobilizer;Other (comment) Activity Tolerance: Patient tolerated treatment well Patient left: in chair;with call bell/phone within reach Nurse Communication: Mobility status   GP     [facility] 01/14/2013, 2:24 PM

## 2013-01-14 NOTE — Progress Notes (Signed)
   Subjective: 2 Days Post-Op Procedure(s) (LRB): TOTAL KNEE ARTHROPLASTY (Left)   Patient reports pain as mild in the left knee Therapy went well yesterday Pt feeling better  Objective:   VITALS:   Filed Vitals:   01/14/13 0500  BP: 147/77  Pulse: 108  Temp: 98 F (36.7 C)  Resp: 18   Left knee incision healing well Dressing changed Neurologically intact distally No erythema or drainage  LABS  Recent Labs  01/13/13 0447 01/14/13 0438  HGB 9.4* 8.3*  HCT 29.1* 25.2*  WBC 9.5 12.5*  PLT 246 238     Recent Labs  01/13/13 0447 01/14/13 0438  NA 136 138  K 3.5 3.4*  BUN 12 12  CREATININE 0.64 0.60  GLUCOSE 126* 125*     Assessment/Plan: 2 Days Post-Op Procedure(s) (LRB): TOTAL KNEE ARTHROPLASTY (Left)  Possible d/c tomorrow  Pain control as needed Up with therapy   Alphonsa Overall, MPAS, PA-C  01/14/2013, 7:41 AM

## 2013-01-14 NOTE — Evaluation (Signed)
Occupational Therapy Evaluation Patient Details Name: Adrienne [name] MRN: [id number] DOB: 01-18-1936 Today's Date: 01/14/2013 Time: 1015-1050 OT Time Calculation (min): 35 min  OT Assessment / Plan / Recommendation Clinical Impression  77 yo female s/p LT TKA that requires Lt ankle brace and KI with all mobility. Pt currenlty plans to d/c to [facility]. OT to follow acutely. Recommend SNF     OT Assessment  Patient needs continued OT Services    Follow Up Recommendations  SNF    Barriers to Discharge      Equipment Recommendations  3 in 1 bedside comode;Other (comment) (RW)    Recommendations for Other Services    Frequency  Min 2X/week    Precautions / Restrictions Precautions Precautions: Knee Required Braces or Orthoses: Other Brace/Splint;Knee Immobilizer - Left Knee Immobilizer - Left: Discontinue once straight leg raise with < 10 degree lag Other Brace/Splint: ASO left ankle when OOB Restrictions Weight Bearing Restrictions: No LLE Weight Bearing: Weight bearing as tolerated   Pertinent Vitals/Pain 7 out 10 Ice applied to knee at end of session + for bowel and bladder void Pt wears pad for incontinence    ADL  Eating/Feeding: Set up Where Assessed - Eating/Feeding: Chair Grooming: Applying makeup;Set up Where Assessed - Grooming: Supported sitting Lower Body Dressing: +1 Total assistance Where Assessed - Lower Body Dressing: Supported sitting Toilet Transfer: Moderate assistance Toilet Transfer Method: Sit to stand Toilet Transfer Equipment: Raised toilet seat with arms (or 3-in-1 over toilet) Toileting - Clothing Manipulation and Hygiene: Maximal assistance Where Assessed - Toileting Clothing Manipulation and Hygiene: Sit to stand from 3-in-1 or toilet Equipment Used: Gait belt;Knee Immobilizer;Rolling walker;Other (comment) (ankle brace) Transfers/Ambulation Related to ADLs: Pt ambulating with decr gait speed and verbalizing gait sequence aloud . Pt  min guard (A)  ADL Comments: Pt educated on wearing tennis shoes with ankle brace to help with stability. Pt completed bed mobility, transfer to bathroom. and position in chair. Pt     OT Diagnosis: Acute pain  OT Problem List: Decreased strength;Impaired balance (sitting and/or standing);Decreased safety awareness;Decreased knowledge of use of DME or AE;Decreased knowledge of precautions;Pain OT Treatment Interventions:     OT Goals Acute Rehab OT Goals OT Goal Formulation: With patient Time For Goal Achievement: 01/28/13 Potential to Achieve Goals: Good ADL Goals Pt Will Perform Grooming: with modified independence;Standing at sink ADL Goal: Grooming - Progress: Goal set today Pt Will Perform Lower Body Bathing: with min assist;Sit to stand from chair;Supported;with adaptive equipment ADL Goal: Lower Body Bathing - Progress: Goal set today Pt Will Perform Lower Body Dressing: with min assist;Sit to stand from chair;with adaptive equipment;Supported ADL Goal: Lower Body Dressing - Progress: Goal set today Pt Will Perform Toileting - Clothing Manipulation: with supervision;Sitting on 3-in-1 or toilet;with adaptive equipment ADL Goal: Toileting - Clothing Manipulation - Progress: Goal set today Pt Will Perform Toileting - Hygiene: with supervision;with adaptive equipment;Sit to stand from 3-in-1/toilet ADL Goal: Toileting - Hygiene - Progress: Goal set today Miscellaneous OT Goals Miscellaneous OT Goal #1: Pt will complete bed mobility HOB <20 degrees no rails supervision level as precursor to adls OT Goal: Miscellaneous Goal #1 - Progress: Goal set today  Visit Information  Last OT Received On: 01/14/13 Assistance Needed: +1    Subjective Data  Subjective: "I always get my therapy at [facility]" Patient Stated Goal: to go to therapy   Prior Functioning     Home Living Lives With: Alone Available Help at Discharge: Skilled Nursing Facility Communication  Communication: No  difficulties Dominant Hand: Right         Vision/Perception Vision - History Baseline Vision: No visual deficits Patient Visual Report: No change from baseline   Cognition  Cognition Overall Cognitive Status: Appears within functional limits for tasks assessed/performed Arousal/Alertness: Awake/alert Orientation Level: Appears intact for tasks assessed Behavior During Session: [facility] for tasks performed    Extremity/Trunk Assessment Right Upper Extremity Assessment RUE ROM/Strength/Tone: Within functional levels RUE Sensation: WFL - Light Touch RUE Coordination: WFL - gross/fine motor Left Upper Extremity Assessment LUE ROM/Strength/Tone: Within functional levels LUE Sensation: WFL - Light Touch LUE Coordination: WFL - gross/fine motor Trunk Assessment Trunk Assessment: Normal     Mobility Bed Mobility Bed Mobility: Supine to Sit;Sitting - Scoot to Edge of Bed Supine to Sit: 3: Mod assist;With rails;HOB elevated Sitting - Scoot to Edge of Bed: 3: Mod assist;With rail (pad used to (A)) Details for Bed Mobility Assistance: pt progressing to EOB exiting on the right side. Pt required (A) to bring left hip around to EOB sitting. Pt tolerateing bed mobility well. Pt static sitting supervision levle Transfers Transfers: Sit to Stand;Stand to Sit Sit to Stand: 3: Mod assist;With upper extremity assist;From bed;From elevated surface Stand to Sit: 4: Min assist;With upper extremity assist;To chair/3-in-1;To elevated surface Details for Transfer Assistance: Pt required elevated surface to help facilitate flexion at hips and weight shift over toes. Pt progressing this session. Pt asked "how did I do?" Pt very pleased with session and smiling at the end     Exercise     Balance Dynamic Standing Balance Dynamic Standing - Balance Support: Bilateral upper extremity supported;During functional activity Dynamic Standing - Level of Assistance: 5: Stand by assistance   End of Session OT -  End of Session Activity Tolerance: Patient tolerated treatment well Patient left: in chair;with call bell/phone within reach Nurse Communication: Mobility status;Precautions       Lucile Shutters 01/14/2013, 10:59 AM Pager: [phone]

## 2013-01-14 NOTE — Plan of Care (Signed)
Problem: Phase II Progression Outcomes Goal: Ambulates Outcome: Progressing Slow gait

## 2013-01-15 ENCOUNTER — Encounter (HOSPITAL_COMMUNITY): Payer: Self-pay | Admitting: Orthopedic Surgery

## 2013-01-15 DIAGNOSIS — D62 Acute posthemorrhagic anemia: Secondary | ICD-10-CM

## 2013-01-15 DIAGNOSIS — Z9289 Personal history of other medical treatment: Secondary | ICD-10-CM

## 2013-01-15 DIAGNOSIS — E876 Hypokalemia: Secondary | ICD-10-CM

## 2013-01-15 LAB — CBC
HCT: 23.3 % — ABNORMAL LOW (ref 36.0–46.0)
Hemoglobin: 7.4 g/dL — ABNORMAL LOW (ref 12.0–15.0)
MCV: 93.2 fL (ref 78.0–100.0)
RBC: 2.5 MIL/uL — ABNORMAL LOW (ref 3.87–5.11)
WBC: 12.6 10*3/uL — ABNORMAL HIGH (ref 4.0–10.5)

## 2013-01-15 MED ORDER — OXYCODONE HCL 5 MG PO TABS: 5.0000 mg | ORAL_TABLET | ORAL | Status: DC | PRN

## 2013-01-15 MED ORDER — RIVAROXABAN 10 MG PO TABS: 10.0000 mg | ORAL_TABLET | Freq: Every day | ORAL | Status: DC

## 2013-01-15 MED ORDER — METHOCARBAMOL 500 MG PO TABS: 500.0000 mg | ORAL_TABLET | Freq: Four times a day (QID) | ORAL | Status: DC | PRN

## 2013-01-15 NOTE — Progress Notes (Signed)
   Subjective: 3 Days Post-Op Procedure(s) (LRB): TOTAL KNEE ARTHROPLASTY (Left) Patient reports pain as mild.   Patient seen in rounds by Dr. Lequita Halt. Patient is well, and has had no acute complaints or problems Patient is ready to go to [facility].  Will receive blood today prior to transfer.  Objective: Vital signs in last 24 hours: Temp:  [98 F (36.7 C)-98.4 F (36.9 C)] 98.4 F (36.9 C) (03/03 0534) Pulse Rate:  [97-106] 99 (03/03 0534) Resp:  [18] 18 (03/03 0534) BP: (128-148)/(62-77) 148/62 mmHg (03/03 0534) SpO2:  [96 %] 96 % (03/03 0534)  Intake/Output from previous day:  Intake/Output Summary (Last 24 hours) at 01/15/13 0710 Last data filed at 01/15/13 0536  Gross per 24 hour  Intake    120 ml  Output   1000 ml  Net   -880 ml    Intake/Output this shift:    Labs:  Recent Labs  01/13/13 0447 01/14/13 0438 01/15/13 0410  HGB 9.4* 8.3* 7.4*    Recent Labs  01/14/13 0438 01/15/13 0410  WBC 12.5* 12.6*  RBC 2.73* 2.50*  HCT 25.2* 23.3*  PLT 238 253    Recent Labs  01/13/13 0447 01/14/13 0438  NA 136 138  K 3.5 3.4*  CL 101 101  CO2 25 26  BUN 12 12  CREATININE 0.64 0.60  GLUCOSE 126* 125*  CALCIUM 8.2* 8.7   No results found for this basename: LABPT, INR,  in the last 72 hours  EXAM: General - Patient is Alert, Appropriate and Oriented Extremity - Neurovascular intact Sensation intact distally Dorsiflexion/Plantar flexion intact No cellulitis present Incision - clean, dry, no drainage, healing Motor Function - intact, moving foot and toes well on exam.   Assessment/Plan: 3 Days Post-Op Procedure(s) (LRB): TOTAL KNEE ARTHROPLASTY (Left) Procedure(s) (LRB): TOTAL KNEE ARTHROPLASTY (Left) Past Medical History  Diagnosis Date  . Hypertension   . Hypercholesterolemia   . Obesity   . Normal nuclear stress test Jan 2012    No ischemia. EF 81%  . Sleep apnea     DOES NOT USE CPAP-UNABLE TO TOLERATE  . Anxiety   . Depression     . Diabetes mellitus     ORAL MED  . Osteopenia   . Shortness of breath     WITH EXERTION  . Hypothyroidism     STATES SHE NO LONGER NEEDS THYROID SUPPLEMENT  . Osteoarthritis (arthritis due to wear and tear of joints)     PAIN AND OA BOTH KNEES  . Urinary frequency   . Frequency of urination   . Nocturia    Active Problems:   Postop Hypokalemia   Postoperative anemia due to acute blood loss   Postop Transfusion  Estimated body mass index is 36.25 kg/(m^2) as calculated from the following:   Height as of this encounter: 5' 2.99" (1.6 m).   Weight as of this encounter: 92.8 kg (204 lb 9.4 oz). Discharge to SNF Diet - Cardiac diet and Diabetic diet Follow up - in 2 weeks Activity - WBAT Disposition - Skilled nursing facility Condition Upon Discharge - Improving D/C Meds - See DC Summary DVT Prophylaxis - Xarelto  PERKINS, ALEXZANDREW 01/15/2013, 7:10 AM

## 2013-01-15 NOTE — Progress Notes (Signed)
Physical Therapy Treatment Patient Details Name: Adrienne [name] MRN: [id number] DOB: 10-Sep-1936 Today's Date: 01/15/2013 Time: 4098-1191 PT Time Calculation (min): 10 min  PT Assessment / Plan / Recommendation Comments on Treatment Session  Hgb 7.4-pt to receive 2 units of blood today prior to d/c. Pt just started on 1st unit so bed exercises only.     Follow Up Recommendations  SNF     Does the patient have the potential to tolerate intense rehabilitation     Barriers to Discharge        Equipment Recommendations  None recommended by PT    Recommendations for Other Services    Frequency 7X/week   Plan Discharge plan remains appropriate    Precautions / Restrictions Precautions Precautions: Knee Required Braces or Orthoses: Other Brace/Splint (ASO L ankle) Knee Immobilizer - Left: Discontinue once straight leg raise with < 10 degree lag Restrictions Weight Bearing Restrictions: No LLE Weight Bearing: Weight bearing as tolerated   Pertinent Vitals/Pain 5/10 L ankle; knee with activity    Mobility  Bed Mobility Bed Mobility: Not assessed Transfers Transfers: Not assessed    Exercises Total Joint Exercises Ankle Circles/Pumps: AROM;Both;10 reps;Supine Quad Sets: AROM;Both;10 reps;Supine Short Arc Quad: AAROM;Left;10 reps;Supine Heel Slides: AAROM;Left;10 reps;Supine Hip ABduction/ADduction: AAROM;Left;10 reps;Supine Straight Leg Raises: AAROM;Left;10 reps;Supine   PT Diagnosis:    PT Problem List:   PT Treatment Interventions:     PT Goals    Visit Information  Last PT Received On: 01/15/13 Assistance Needed: +1    Subjective Data  Subjective: My ankle is sore Patient Stated Goal: rehab   Cognition  Cognition Overall Cognitive Status: Appears within functional limits for tasks assessed/performed Arousal/Alertness: Awake/[name] Orientation Level: Appears intact for tasks assessed Behavior During Session: [facility] for tasks performed    Balance     End  of Session PT - End of Session Equipment Utilized During Treatment: Gait belt;Left knee immobilizer Activity Tolerance: Patient tolerated treatment well Patient left: in chair;with call bell/phone within reach   GP     Adrienne [name] [facility] 01/15/2013, 12:59 PM [phone]

## 2013-01-15 NOTE — Discharge Summary (Signed)
Physician Discharge Summary   Patient ID: Adrienne [name] MRN: [id number] DOB/AGE: 75-Aug-1937 75 y.o.  Admit date: 01/12/2013 Discharge date: 01/15/2013  Primary Diagnosis:  Osteoarthritis Left knee  Admission Diagnoses:  Past Medical History  Diagnosis Date  . Hypertension   . Hypercholesterolemia   . Obesity   . Normal nuclear stress test Jan 2012    No ischemia. EF 81%  . Sleep apnea     DOES NOT USE CPAP-UNABLE TO TOLERATE  . Anxiety   . Depression   . Diabetes mellitus     ORAL MED  . Osteopenia   . Shortness of breath     WITH EXERTION  . Hypothyroidism     STATES SHE NO LONGER NEEDS THYROID SUPPLEMENT  . Osteoarthritis (arthritis due to wear and tear of joints)     PAIN AND OA BOTH KNEES  . Urinary frequency   . Frequency of urination   . Nocturia    Discharge Diagnoses:   Active Problems:   Postop Hypokalemia   Postoperative anemia due to acute blood loss   Postop Transfusion  Estimated body mass index is 36.25 kg/(m^2) as calculated from the following:   Height as of this encounter: 5' 2.99" (1.6 m).   Weight as of this encounter: 92.8 kg (204 lb 9.4 oz).  Classification of overweight in adults according to BMI (WHO, 1998)   Procedure:  Procedure(s) (LRB): TOTAL KNEE ARTHROPLASTY (Left)   Consults: None  HPI: Adrienne [name] is a 77 y.o. year old female with end stage OA of her left knee with progressively worsening pain and dysfunction. She has constant pain, with activity and at rest and significant functional deficits with difficulties even with ADLs. She has had extensive non-op management including analgesics, injections of cortisone and viscosupplements, and home exercise program, but remains in significant pain with significant dysfunction. Radiographs show bone on bone arthritis medial and patellofemoral. She presents now for left Total Knee Arthroplasty.   Laboratory Data: Admission on 01/12/2013  Component Date Value Range Status  .  ABO/RH(D) 01/12/2013 AB POS   Final  . Antibody Screen 01/12/2013 NEG   Final  . Sample Expiration 01/12/2013 01/15/2013   Final  . Glucose-Capillary 01/12/2013 96  70 - 99 mg/dL Final  . Comment 1 78/29/5621 Documented in Chart   Final  . Glucose-Capillary 01/12/2013 92  70 - 99 mg/dL Final  . Comment 1 30/86/5784 Documented in Chart   Final  . WBC 01/13/2013 9.5  4.0 - 10.5 K/uL Final  . RBC 01/13/2013 3.14* 3.87 - 5.11 MIL/uL Final  . Hemoglobin 01/13/2013 9.4* 12.0 - 15.0 g/dL Final  . HCT 69/62/9528 29.1* 36.0 - 46.0 % Final  . MCV 01/13/2013 92.7  78.0 - 100.0 fL Final  . MCH 01/13/2013 29.9  26.0 - 34.0 pg Final  . MCHC 01/13/2013 32.3  30.0 - 36.0 g/dL Final  . RDW 41/32/4401 13.6  11.5 - 15.5 % Final  . Platelets 01/13/2013 246  150 - 400 K/uL Final  . Sodium 01/13/2013 136  135 - 145 mEq/L Final  . Potassium 01/13/2013 3.5  3.5 - 5.1 mEq/L Final  . Chloride 01/13/2013 101  96 - 112 mEq/L Final  . CO2 01/13/2013 25  19 - 32 mEq/L Final  . Glucose, Bld 01/13/2013 126* 70 - 99 mg/dL Final  . BUN 02/72/5366 12  6 - 23 mg/dL Final  . Creatinine, Ser 01/13/2013 0.64  0.50 - 1.10 mg/dL Final  . Calcium 44/01/4741 8.2*  8.4 - 10.5 mg/dL Final  . GFR calc non Af Amer 01/13/2013 86* >90 mL/min Final  . GFR calc Af Amer 01/13/2013 >90  >90 mL/min Final   Comment:                                 The eGFR has been calculated                          using the CKD EPI equation.                          This calculation has not been                          validated in all clinical                          situations.                          eGFR's persistently                          <90 mL/min signify                          possible Chronic Kidney Disease.  . WBC 01/14/2013 12.5* 4.0 - 10.5 K/uL Final  . RBC 01/14/2013 2.73* 3.87 - 5.11 MIL/uL Final  . Hemoglobin 01/14/2013 8.3* 12.0 - 15.0 g/dL Final  . HCT 96/02/5408 25.2* 36.0 - 46.0 % Final  . MCV 01/14/2013 92.3  78.0 -  100.0 fL Final  . MCH 01/14/2013 30.4  26.0 - 34.0 pg Final  . MCHC 01/14/2013 32.9  30.0 - 36.0 g/dL Final  . RDW 81/19/1478 13.7  11.5 - 15.5 % Final  . Platelets 01/14/2013 238  150 - 400 K/uL Final  . Sodium 01/14/2013 138  135 - 145 mEq/L Final  . Potassium 01/14/2013 3.4* 3.5 - 5.1 mEq/L Final  . Chloride 01/14/2013 101  96 - 112 mEq/L Final  . CO2 01/14/2013 26  19 - 32 mEq/L Final  . Glucose, Bld 01/14/2013 125* 70 - 99 mg/dL Final  . BUN 29/56/2130 12  6 - 23 mg/dL Final  . Creatinine, Ser 01/14/2013 0.60  0.50 - 1.10 mg/dL Final  . Calcium 86/57/8469 8.7  8.4 - 10.5 mg/dL Final  . GFR calc non Af Amer 01/14/2013 88* >90 mL/min Final  . GFR calc Af Amer 01/14/2013 >90  >90 mL/min Final   Comment:                                 The eGFR has been calculated                          using the CKD EPI equation.                          This calculation has not been  validated in all clinical                          situations.                          eGFR's persistently                          <90 mL/min signify                          possible Chronic Kidney Disease.  . WBC 01/15/2013 12.6* 4.0 - 10.5 K/uL Final  . RBC 01/15/2013 2.50* 3.87 - 5.11 MIL/uL Final  . Hemoglobin 01/15/2013 7.4* 12.0 - 15.0 g/dL Final  . HCT 16/08/9603 23.3* 36.0 - 46.0 % Final  . MCV 01/15/2013 93.2  78.0 - 100.0 fL Final  . MCH 01/15/2013 29.6  26.0 - 34.0 pg Final  . MCHC 01/15/2013 31.8  30.0 - 36.0 g/dL Final  . RDW 54/07/8118 13.9  11.5 - 15.5 % Final  . Platelets 01/15/2013 253  150 - 400 K/uL Final  Hospital Outpatient Visit on 01/05/2013  Component Date Value Range Status  . MRSA, PCR 01/05/2013 NEGATIVE  NEGATIVE Final  . Staphylococcus aureus 01/05/2013 NEGATIVE  NEGATIVE Final   Comment:                                 The Xpert SA Assay (FDA                          approved for NASAL specimens                          in patients over 21 years of  age),                          is one component of                          a comprehensive surveillance                          program.  Test performance has                          been validated by [employer] for patients greater                          than or equal to 76 year old.                          It is not intended                          to diagnose infection nor to  guide or monitor treatment.  Marland Kitchen aPTT 01/05/2013 33  24 - 37 seconds Final  . WBC 01/05/2013 6.9  4.0 - 10.5 K/uL Final  . RBC 01/05/2013 3.95  3.87 - 5.11 MIL/uL Final  . Hemoglobin 01/05/2013 12.0  12.0 - 15.0 g/dL Final  . HCT 45/40/9811 37.1  36.0 - 46.0 % Final  . MCV 01/05/2013 93.9  78.0 - 100.0 fL Final  . MCH 01/05/2013 30.4  26.0 - 34.0 pg Final  . MCHC 01/05/2013 32.3  30.0 - 36.0 g/dL Final  . RDW 91/47/8295 13.4  11.5 - 15.5 % Final  . Platelets 01/05/2013 264  150 - 400 K/uL Final  . Sodium 01/05/2013 141  135 - 145 mEq/L Final  . Potassium 01/05/2013 4.7  3.5 - 5.1 mEq/L Final  . Chloride 01/05/2013 102  96 - 112 mEq/L Final  . CO2 01/05/2013 27  19 - 32 mEq/L Final  . Glucose, Bld 01/05/2013 111* 70 - 99 mg/dL Final  . BUN 62/13/0865 23  6 - 23 mg/dL Final  . Creatinine, Ser 01/05/2013 0.71  0.50 - 1.10 mg/dL Final  . Calcium 78/46/9629 10.0  8.4 - 10.5 mg/dL Final  . Total Protein 01/05/2013 7.1  6.0 - 8.3 g/dL Final  . Albumin 52/84/1324 3.6  3.5 - 5.2 g/dL Final  . AST 40/08/2724 34  0 - 37 U/L Final  . ALT 01/05/2013 20  0 - 35 U/L Final  . Alkaline Phosphatase 01/05/2013 45  39 - 117 U/L Final  . Total Bilirubin 01/05/2013 0.8  0.3 - 1.2 mg/dL Final  . GFR calc non Af Amer 01/05/2013 83* >90 mL/min Final  . GFR calc Af Amer 01/05/2013 >90  >90 mL/min Final   Comment:                                 The eGFR has been calculated                          using the CKD EPI equation.                          This calculation has not  been                          validated in all clinical                          situations.                          eGFR's persistently                          <90 mL/min signify                          possible Chronic Kidney Disease.  Marland Kitchen Prothrombin Time 01/05/2013 13.5  11.6 - 15.2 seconds Final  . INR 01/05/2013 1.04  0.00 - 1.49 Final  . Color, Urine 01/05/2013 YELLOW  YELLOW Final  . APPearance 01/05/2013 CLEAR  CLEAR Final  . Specific Gravity, Urine 01/05/2013 1.017  1.005 - 1.030 Final  . pH 01/05/2013 5.5  5.0 - 8.0 Final  . Glucose,  UA 01/05/2013 NEGATIVE  NEGATIVE mg/dL Final  . Hgb urine dipstick 01/05/2013 TRACE* NEGATIVE Final  . Bilirubin Urine 01/05/2013 NEGATIVE  NEGATIVE Final  . Ketones, ur 01/05/2013 NEGATIVE  NEGATIVE mg/dL Final  . Protein, ur 81/19/1478 NEGATIVE  NEGATIVE mg/dL Final  . Urobilinogen, UA 01/05/2013 0.2  0.0 - 1.0 mg/dL Final  . Nitrite 29/56/2130 NEGATIVE  NEGATIVE Final  . Leukocytes, UA 01/05/2013 SMALL* NEGATIVE Final  . Squamous Epithelial / LPF 01/05/2013 RARE  RARE Final  . WBC, UA 01/05/2013 0-2  <3 WBC/hpf Final  . RBC / HPF 01/05/2013 0-2  <3 RBC/hpf Final  . Bacteria, UA 01/05/2013 RARE  RARE Final     X-Rays:No results found.  EKG: Orders placed in visit on 02/08/12  . EKG 12-LEAD     Hospital Course: Adrienne [name] is a 77 y.o. who was admitted to [facility]. They were brought to the operating room on 01/12/2013 and underwent Procedure(s): TOTAL KNEE ARTHROPLASTY.  Patient tolerated the procedure well and was later transferred to the recovery room and then to the orthopaedic floor for postoperative care.  They were given PO and IV analgesics for pain control following their surgery.  They were given 24 hours of postoperative antibiotics of  Anti-infectives   Start     Dose/Rate Route Frequency Ordered Stop   01/12/13 1400  ceFAZolin (ANCEF) IVPB 1 g/50 mL premix     1 g 100 mL/hr over 30 Minutes Intravenous Every  6 hours 01/12/13 1039 01/12/13 2044   01/12/13 0600  ceFAZolin (ANCEF) IVPB 2 g/50 mL premix    Comments:  Dose decreased to 2g per P&T policy for weight < 120kg.   2 g 100 mL/hr over 30 Minutes Intravenous On call to O.R. 01/12/13 0534 01/12/13 0700     and started on DVT prophylaxis in the form of Xarelto.   PT and OT were ordered for total joint protocol.  Discharge planning consulted to help with postop disposition and equipment needs.  She wanted to look into SNF.  Patient had a tough night on the evening of surgery with moderate pain.  They started to get up OOB with therapy on day one. Hemovac drain was pulled without difficulty.  Continued to work with therapy into day two.  Dressing was changed on day two and the incision was healing well.  By day three, the patient's HGB had dropped down to 7.4.  It was felt that the patient would benefit from blood transfusion.  Arrangements were made for transfusion and then would send the patient over to the SNF following the blood.  Incision was healing well.  Patient was seen in rounds and was ready to go to [facility] following the blood transfusion.   Discharge Medications: Prior to Admission medications   Medication Sig Start Date End Date Taking? Authorizing Provider  atorvastatin (LIPITOR) 20 MG tablet Take 20 mg by mouth daily before supper.    Yes Historical Provider, MD  Fesoterodine Fumarate (TOVIAZ) 8 MG TB24 Take 8 mg by mouth daily before supper.    Yes Historical Provider, MD  losartan-hydrochlorothiazide (HYZAAR) 100-25 MG per tablet Take 1 tablet by mouth every morning.   Yes Historical Provider, MD  memantine (NAMENDA) 10 MG tablet Take 10 mg by mouth 2 (two) times daily.   Yes Historical Provider, MD  metFORMIN (GLUCOPHAGE) 500 MG tablet Take 500 mg by mouth 2 (two) times daily.    Yes Historical Provider, MD  sertraline (ZOLOFT) 50 MG  tablet Take 50 mg by mouth every morning.  04/25/11  Yes Historical Provider, MD  traMADol  (ULTRAM) 50 MG tablet Take 50-100 mg by mouth every 6 (six) hours as needed for pain.    Yes Historical Provider, MD  methocarbamol (ROBAXIN) 500 MG tablet Take 1 tablet (500 mg total) by mouth every 6 (six) hours as needed. 01/15/13   Bluma Buresh, PA  oxyCODONE (OXY IR/ROXICODONE) 5 MG immediate release tablet Take 1-2 tablets (5-10 mg total) by mouth every 3 (three) hours as needed. 01/15/13   Micajah Dennin Julien Girt, PA  rivaroxaban (XARELTO) 10 MG TABS tablet Take 1 tablet (10 mg total) by mouth daily with breakfast. Take Xarelto for two and a half more weeks, then discontinue Xarelto. Once the patient has completed the Xarelto, they may resume the 81 mg Aspirin. 01/15/13   Nini Cavan Julien Girt, PA    Diet: Cardiac diet Activity:WBAT Follow-up:in 2 weeks Disposition - Skilled nursing facility - [facility] Discharged Condition: good   Discharge Orders   Future Orders Complete By Expires     Call MD / Call 911  As directed     Comments:      If you experience chest pain or shortness of breath, CALL 911 and be transported to the hospital emergency room.  If you develope a fever above 101 F, pus (white drainage) or increased drainage or redness at the wound, or calf pain, call your surgeon's office.    Change dressing  As directed     Comments:      Change dressing daily with sterile 4 x 4 inch gauze dressing and apply TED hose. Do not submerge the incision under water.    Constipation Prevention  As directed     Comments:      Drink plenty of fluids.  Prune juice may be helpful.  You may use a stool softener, such as Colace (over the counter) 100 mg twice a day.  Use MiraLax (over the counter) for constipation as needed.    Diet - low sodium heart healthy  As directed     Discharge instructions  As directed     Comments:      Pick up stool softner and laxative for home. Do not submerge incision under water. May shower. Continue to use ice for pain and swelling from surgery.  Take  Xarelto for two and a half more weeks, then discontinue Xarelto. Once the patient has completed the Xarelto, they may resume the 81 mg Aspirin.  When discharged from the skilled rehab facility, please have the facility set up the patient's Home Health Physical Therapy prior to being released.  Also provide the patient with their medications at time of release from the facility to include their pain medication, the muscle relaxants, and their blood thinner medication.  If the patient is still at the rehab facility at time of follow up appointment, please also assist the patient in arranging follow up appointment in our office and any transportation needs.    Do not put a pillow under the knee. Place it under the heel.  As directed     Do not sit on low chairs, stoools or toilet seats, as it may be difficult to get up from low surfaces  As directed     Driving restrictions  As directed     Comments:      No driving until released by the physician.    Increase activity slowly as tolerated  As directed  Lifting restrictions  As directed     Comments:      No lifting until released by the physician.    Patient may shower  As directed     Comments:      You may shower without a dressing once there is no drainage.  Do not wash over the wound.  If drainage remains, do not shower until drainage stops.    TED hose  As directed     Comments:      Use stockings (TED hose) for 3 weeks on both leg(s).  You may remove them at night for sleeping.    Weight bearing as tolerated  As directed         Medication List    STOP taking these medications       aspirin EC 81 MG tablet     CALTRATE 600+D PO     cholecalciferol 1000 UNITS tablet  Commonly known as:  VITAMIN D     Icy Hot Balm Extra Strength 7.6-29 % Oint     multivitamin with minerals Tabs      TAKE these medications       atorvastatin 20 MG tablet  Commonly known as:  LIPITOR  Take 20 mg by mouth daily before supper.      losartan-hydrochlorothiazide 100-25 MG per tablet  Commonly known as:  HYZAAR  Take 1 tablet by mouth every morning.     memantine 10 MG tablet  Commonly known as:  NAMENDA  Take 10 mg by mouth 2 (two) times daily.     metFORMIN 500 MG tablet  Commonly known as:  GLUCOPHAGE  Take 500 mg by mouth 2 (two) times daily.     methocarbamol 500 MG tablet  Commonly known as:  ROBAXIN  Take 1 tablet (500 mg total) by mouth every 6 (six) hours as needed.     oxyCODONE 5 MG immediate release tablet  Commonly known as:  Oxy IR/ROXICODONE  Take 1-2 tablets (5-10 mg total) by mouth every 3 (three) hours as needed.     rivaroxaban 10 MG Tabs tablet  Commonly known as:  XARELTO  Take 1 tablet (10 mg total) by mouth daily with breakfast. Take Xarelto for two and a half more weeks, then discontinue Xarelto.  Once the patient has completed the Xarelto, they may resume the 81 mg Aspirin.     sertraline 50 MG tablet  Commonly known as:  ZOLOFT  Take 50 mg by mouth every morning.     TOVIAZ 8 MG Tb24  Generic drug:  fesoterodine  Take 8 mg by mouth daily before supper.     traMADol 50 MG tablet  Commonly known as:  ULTRAM  Take 50-100 mg by mouth every 6 (six) hours as needed for pain.           Follow-up Information   Follow up with Loanne Drilling, MD. Schedule an appointment as soon as possible for a visit in 2 weeks.   Contact information:   [address], [address] [place] [address] [phone]       Signed: Patrica Duel 01/15/2013, 7:17 AM

## 2013-01-15 NOTE — Progress Notes (Signed)
Physical Therapy Treatment Patient Details Name: Adrienne [name] MRN: [id number] DOB: 10/27/1936 Today's Date: 01/15/2013 Time: 7829-5621 PT Time Calculation (min): 27 min  PT Assessment / Plan / Recommendation Comments on Treatment Session  Pt attempted standing today however extremely limited by pain and RN reports meds given 2 hrs prior to session.  Pt sat EOB and attempted standing x2 however unable to perform so assisted back to supine.    Follow Up Recommendations  SNF     Does the patient have the potential to tolerate intense rehabilitation     Barriers to Discharge        Equipment Recommendations  None recommended by PT    Recommendations for Other Services    Frequency 7X/week   Plan Discharge plan remains appropriate    Precautions / Restrictions Precautions Precautions: Knee Required Braces or Orthoses: Other Brace/Splint;Knee Immobilizer - Left Knee Immobilizer - Left: Discontinue once straight leg raise with < 10 degree lag Other Brace/Splint: ASO left ankle when OOB Restrictions Weight Bearing Restrictions: No LLE Weight Bearing: Weight bearing as tolerated   Pertinent Vitals/Pain L knee pain limiting ability to perform mobility, premedicated 2 hrs prior to session per RN    Mobility  Bed Mobility Bed Mobility: Supine to Sit;Sitting - Scoot to [place] of Bed;Scooting to [facility];Sit to Supine Supine to Sit: 2: Max assist;HOB elevated;With rails Sitting - Scoot to Edge of Bed: 3: Mod assist;With rail Sit to Supine: 4: Min assist;HOB elevated Scooting to HOB: 2: Max assist Details for Bed Mobility Assistance: verbal cues for technique, increased time and effort then provided assist, increased cues for self assist Transfers Transfers: Sit to Stand Details for Transfer Assistance: donned pt's shoes EOB then attempted standing x2 with rest break inbetween however pt unable to assist due to pain so assisted back to supine    Exercises Total Joint Exercises Ankle  Circles/Pumps: AROM;Both;10 reps;Supine Quad Sets: AROM;Both;10 reps;Supine Short Arc Quad: AAROM;Left;10 reps;Supine Heel Slides: AAROM;Left;10 reps;Supine Hip ABduction/ADduction: AAROM;Left;10 reps;Supine Straight Leg Raises: AAROM;Left;10 reps;Supine   PT Diagnosis:    PT Problem List:   PT Treatment Interventions:     PT Goals Acute Rehab PT Goals PT Goal: Supine/Side to Sit - Progress: Progressing toward goal PT Goal: Sit to Supine/Side - Progress: Progressing toward goal PT Goal: Sit to Stand - Progress: Not progressing  Visit Information  Last PT Received On: 01/15/13 Assistance Needed: +2    Subjective Data  Subjective: It hurts.  (knee) Patient Stated Goal: rehab   Cognition  Cognition Overall Cognitive Status: Appears within functional limits for tasks assessed/performed Arousal/Alertness: Awake/alert Orientation Level: Appears intact for tasks assessed Behavior During Session: [facility] for tasks performed    Balance     End of Session PT - End of Session Equipment Utilized During Treatment: Gait belt;Left knee immobilizer Activity Tolerance: Patient limited by pain Patient left: in bed;with call bell/phone within reach;with nursing in room Nurse Communication: Patient requests pain meds;Mobility status   GP     Brea Coleson,[name] 01/15/2013, 3:14 PM [name], PT, DPT 01/15/2013 Pager: [phone]

## 2013-01-15 NOTE — Care Management Note (Signed)
    Page 1 of 1   01/15/2013     9:59:21 AM   CARE MANAGEMENT NOTE 01/15/2013  Patient:  Adrienne [name], Adrienne [name]   Account Number:  [id number]  Date Initiated:  01/15/2013  Documentation initiated by:  Lorenda Ishihara  Subjective/Objective Assessment:   77 yo female admitted s/p total knee arthroplasty. PTA lived at home alone.     Action/Plan:   Plan for SNF for rehab.   Anticipated DC Date:  01/15/2013   Anticipated DC Plan:  SKILLED NURSING FACILITY  In-house referral  Clinical Social Worker      DC Planning Services  CM consult      Choice offered to / List presented to:             Status of service:  Completed, signed off Medicare Important Message given?   (If response is "NO", the following Medicare IM given date fields will be blank) Date Medicare IM given:   Date Additional Medicare IM given:    Discharge Disposition:  SKILLED NURSING FACILITY  Per UR Regulation:  Reviewed for med. necessity/level of care/duration of stay  If discussed at Long Length of Stay Meetings, dates discussed:    Comments:

## 2013-01-16 LAB — TYPE AND SCREEN
ABO/RH(D): AB POS
Antibody Screen: NEGATIVE
Unit division: 0

## 2013-01-16 NOTE — Progress Notes (Signed)
Clinical Social Work Department CLINICAL SOCIAL WORK PLACEMENT NOTE 01/16/2013  Patient:  Adrienne [name], Adrienne [name]  Account Number:  [id number] Admit date:  01/12/2013  Clinical Social Worker:  Cori Razor, LCSW  Date/time:  01/12/2013 02:41 PM  Clinical Social Work is seeking post-discharge placement for this patient at the following level of care:   SKILLED NURSING   (*CSW will update this form in Epic as items are completed)     Patient/family provided with [facility] Department of Clinical Social Work's list of facilities offering this level of care within the geographic area requested by the patient (or if unable, by the patient's family).  01/12/2013  Patient/family informed of their freedom to choose among providers that offer the needed level of care, that participate in Medicare, Medicaid or managed care program needed by the patient, have an available bed and are willing to accept the patient.    Patient/family informed of [facility]' ownership interest in [facility], as well as of the fact that they are under no obligation to receive care at this facility.  PASARR submitted to [employer] on  PASARR number received from [employer] on 04/24/2012  FL2 transmitted to all facilities in geographic area requested by pt/family on  01/12/2013 FL2 transmitted to all facilities within larger geographic area on   Patient informed that his/her managed care company has contracts with or will negotiate with  certain facilities, including the following:     Patient/family informed of bed offers received:  01/12/2013 Patient chooses bed at [facility] Physician recommends and patient chooses bed at    Patient to be transferred to [facility] on  01/15/2013 Patient to be transferred to facility by [facility]  The following physician request were entered in Epic:   Additional Comments:  Cori Razor LCSW [phone]

## 2013-08-19 ENCOUNTER — Emergency Department (HOSPITAL_COMMUNITY): Payer: Medicare Other

## 2013-08-19 ENCOUNTER — Encounter (HOSPITAL_COMMUNITY): Payer: Self-pay | Admitting: Emergency Medicine

## 2013-08-19 ENCOUNTER — Emergency Department (HOSPITAL_COMMUNITY)
Admission: EM | Admit: 2013-08-19 | Discharge: 2013-08-20 | Disposition: A | Discharge status: Home / Self Care | Payer: Medicare Other | Attending: Emergency Medicine | Admitting: Emergency Medicine

## 2013-08-19 DIAGNOSIS — M25551 Pain in right hip: Secondary | ICD-10-CM

## 2013-08-19 DIAGNOSIS — Z7982 Long term (current) use of aspirin: Secondary | ICD-10-CM | POA: Insufficient documentation

## 2013-08-19 DIAGNOSIS — Z79899 Other long term (current) drug therapy: Secondary | ICD-10-CM | POA: Insufficient documentation

## 2013-08-19 DIAGNOSIS — F329 Major depressive disorder, single episode, unspecified: Secondary | ICD-10-CM | POA: Insufficient documentation

## 2013-08-19 DIAGNOSIS — M899 Disorder of bone, unspecified: Secondary | ICD-10-CM | POA: Insufficient documentation

## 2013-08-19 DIAGNOSIS — E119 Type 2 diabetes mellitus without complications: Secondary | ICD-10-CM | POA: Insufficient documentation

## 2013-08-19 DIAGNOSIS — E7800 Pure hypercholesterolemia, unspecified: Secondary | ICD-10-CM | POA: Insufficient documentation

## 2013-08-19 DIAGNOSIS — F3289 Other specified depressive episodes: Secondary | ICD-10-CM | POA: Insufficient documentation

## 2013-08-19 DIAGNOSIS — M25559 Pain in unspecified hip: Secondary | ICD-10-CM | POA: Insufficient documentation

## 2013-08-19 DIAGNOSIS — E669 Obesity, unspecified: Secondary | ICD-10-CM | POA: Insufficient documentation

## 2013-08-19 DIAGNOSIS — I10 Essential (primary) hypertension: Secondary | ICD-10-CM | POA: Insufficient documentation

## 2013-08-19 DIAGNOSIS — M1710 Unilateral primary osteoarthritis, unspecified knee: Secondary | ICD-10-CM | POA: Insufficient documentation

## 2013-08-19 DIAGNOSIS — Z862 Personal history of diseases of the blood and blood-forming organs and certain disorders involving the immune mechanism: Secondary | ICD-10-CM | POA: Insufficient documentation

## 2013-08-19 DIAGNOSIS — Z8639 Personal history of other endocrine, nutritional and metabolic disease: Secondary | ICD-10-CM | POA: Insufficient documentation

## 2013-08-19 DIAGNOSIS — F411 Generalized anxiety disorder: Secondary | ICD-10-CM | POA: Insufficient documentation

## 2013-08-19 MED ORDER — ONDANSETRON HCL 4 MG PO TABS: 4.0000 mg | ORAL_TABLET | Freq: Four times a day (QID) | ORAL | Status: DC

## 2013-08-19 MED ORDER — ONDANSETRON 8 MG PO TBDP
8.0000 mg | ORAL_TABLET | Freq: Once | ORAL | Status: AC
  Administered 2013-08-19: 8 mg via ORAL
  Filled 2013-08-19: qty 1

## 2013-08-19 MED ORDER — TRAMADOL HCL 50 MG PO TABS
50.0000 mg | ORAL_TABLET | Freq: Once | ORAL | Status: AC
  Administered 2013-08-20: 50 mg via ORAL
  Filled 2013-08-19: qty 1

## 2013-08-19 MED ORDER — OXYCODONE-ACETAMINOPHEN 5-325 MG PO TABS: 1.0000 | ORAL_TABLET | Freq: Four times a day (QID) | ORAL | Status: DC | PRN

## 2013-08-19 MED ORDER — HYDROCODONE-ACETAMINOPHEN 5-325 MG PO TABS
2.0000 | ORAL_TABLET | Freq: Once | ORAL | Status: AC
  Administered 2013-08-19: 2 via ORAL
  Filled 2013-08-19: qty 2

## 2013-08-19 NOTE — ED Provider Notes (Signed)
CSN: [id number]     Arrival date & time 08/19/13  1918 History   First MD Initiated Contact with Patient 08/19/13 2028     Chief Complaint  Patient presents with  . Hip Pain   (Consider location/radiation/quality/duration/timing/severity/associated sxs/prior Treatment) HPI Comments: Patient is a 77 year old female with history of hypertension, hypercholesterolemia, obesity, anxiety, depression, diabetes, osteopenia, hypothyroidism presents today with right hip pain. She was walking a few days ago and the pain started. It is a sharp pain that is very well localized to her SI joint. The pain is worse with walking. She did not specify time of day or the pain is worse, just when she walks. Nothing makes the pain better. She has tried heat and tramadol. She denies any radiation to her pain including to her spine, down her legs. She denies any numbness, weakness, paresthesias. She does note that she has some mild left ankle pain which he believes is unrelated. No bowel or bladder incontinence, history of cancer, drug abuse. She denies any shortness of breath, chest pain, nausea, vomiting, abdominal pain.  The history is provided by the patient. No language interpreter was used.    Past Medical History  Diagnosis Date  . Hypertension   . Hypercholesterolemia   . Obesity   . Normal nuclear stress test Jan 2012    No ischemia. EF 81%  . Sleep apnea     DOES NOT USE CPAP-UNABLE TO TOLERATE  . Anxiety   . Depression   . Diabetes mellitus     ORAL MED  . Osteopenia   . Shortness of breath     WITH EXERTION  . Hypothyroidism     STATES SHE NO LONGER NEEDS THYROID SUPPLEMENT  . Osteoarthritis (arthritis due to wear and tear of joints)     PAIN AND OA BOTH KNEES  . Urinary frequency   . Frequency of urination   . Nocturia    Past Surgical History  Procedure Laterality Date  . Vesicovaginal fistula closure w/ tah    . Thyroidectomy, partial    . Cardiac catheterization  02/03/1999    EF 70%   . US echocardiography  08/21/2004    EF 60-65%  . Cardiovascular stress test  11/30/2010    EF 81%, NORMAL  . Bilateral oophorectomy    . Abdominal hysterectomy    . Total knee arthroplasty  04/21/2012    Procedure: TOTAL KNEE ARTHROPLASTY;  Surgeon: Loanne Drilling, MD;  Location: [facility];  Service: Orthopedics;  Laterality: Right;  . Orif ankle fracture  2004  . Total knee arthroplasty Left 01/12/2013    Procedure: TOTAL KNEE ARTHROPLASTY;  Surgeon: Loanne Drilling, MD;  Location: [facility];  Service: Orthopedics;  Laterality: Left;   Family History  Problem Relation Age of Onset  . Heart disease Father   . Hypertension Father   . Heart attack Father   . Colon cancer Mother   . Heart attack Mother   . Hypertension Mother   . Breast cancer Sister    History  Substance Use Topics  . Smoking status: Never Smoker   . Smokeless tobacco: Not on file  . Alcohol Use: No   OB History   Grav Para Term Preterm Abortions TAB SAB Ect Mult Living                 Review of Systems  Constitutional: Negative for fever and chills.  Respiratory: Negative for shortness of breath.   Cardiovascular: Negative for chest pain.  Gastrointestinal: Negative for nausea, vomiting and abdominal pain.  Musculoskeletal: Positive for arthralgias. Negative for back pain.  All other systems reviewed and are negative.    Allergies  Ibuprofen and Sulfonamide derivatives  Home Medications   Current Outpatient Rx  Name  Route  Sig  Dispense  Refill  . acetaminophen (TYLENOL) 500 MG tablet   Oral   Take 500 mg by mouth every 6 (six) hours as needed for pain.         Marland Kitchen aspirin EC 81 MG tablet   Oral   Take 81 mg by mouth daily.         Marland Kitchen atorvastatin (LIPITOR) 20 MG tablet   Oral   Take 20 mg by mouth daily before supper.          . Calcium Carbonate (CALTRATE 600 PO)   Oral   Take 1 tablet by mouth 2 (two) times daily.         . Cholecalciferol (VITAMIN D PO)   Oral   Take 1 tablet by  mouth daily.         Marland Kitchen Fesoterodine Fumarate (TOVIAZ) 8 MG TB24   Oral   Take 8 mg by mouth daily before supper.          . losartan-hydrochlorothiazide (HYZAAR) 100-25 MG per tablet   Oral   Take 1 tablet by mouth every morning.         . memantine (NAMENDA) 10 MG tablet   Oral   Take 10 mg by mouth 2 (two) times daily.         . metFORMIN (GLUCOPHAGE) 500 MG tablet   Oral   Take 500 mg by mouth 2 (two) times daily.          . Multiple Vitamin (MULTIVITAMIN WITH MINERALS) TABS tablet   Oral   Take 1 tablet by mouth daily.         . sertraline (ZOLOFT) 50 MG tablet   Oral   Take 50 mg by mouth every morning.          . traMADol (ULTRAM) 50 MG tablet   Oral   Take 50-100 mg by mouth every 6 (six) hours as needed for pain.           BP 123/66  Pulse 55  Temp(Src) 97.7 F (36.5 C) (Oral)  Resp 18  SpO2 94% Physical Exam  Nursing note and vitals reviewed. Constitutional: She is oriented to person, place, and time. She appears well-developed and well-nourished. No distress.  HENT:  Head: Normocephalic and atraumatic.  Right Ear: External ear normal.  Left Ear: External ear normal.  Nose: Nose normal.  Mouth/Throat: Oropharynx is clear and moist.  Eyes: Conjunctivae are normal.  Neck: Normal range of motion.  Cardiovascular: Normal rate, regular rhythm, normal heart sounds, intact distal pulses and normal pulses.   Pulses:      Femoral pulses are 2+ on the right side, and 2+ on the left side.      Dorsalis pedis pulses are 2+ on the right side, and 2+ on the left side.       Posterior tibial pulses are 2+ on the right side, and 2+ on the left side.  Pulmonary/Chest: Effort normal and breath sounds normal. No stridor. No respiratory distress. She has no wheezes. She has no rales.  Abdominal: Soft. She exhibits no distension.  Musculoskeletal: Normal range of motion.  Logroll test negative bilaterally. Straight leg raise negative bilaterally. Full range  of motion of right hip. Compartment soft. Neurovascularly intact. Sensation intact.  Tender to palpation over right SI joint.   Lymphadenopathy:       Right: No inguinal adenopathy present.       Left: No inguinal adenopathy present.  Neurological: She is alert and oriented to person, place, and time. She has normal strength.  Skin: Skin is warm and dry. She is not diaphoretic. No erythema.  Psychiatric: She has a normal mood and affect. Her behavior is normal.    ED Course  Procedures (including critical care time) Labs Review Labs Reviewed - No data to display Imaging Review Dg Hip Complete Right  08/19/2013   CLINICAL DATA:  Right hip pain  EXAM: RIGHT HIP - COMPLETE 2+ VIEW  COMPARISON:  None.  FINDINGS: Degenerative changes of the hip joints are noted bilaterally. No acute fracture or dislocation is noted. The pelvic ring is intact. No gross soft tissue abnormality is seen.  IMPRESSION: No acute abnormality noted. Degenerative changes are seen.   Electronically Signed   By: Alcide Clever M.D.   On: 08/19/2013 21:10   Ct Hip Right Wo Contrast  08/19/2013   *RADIOLOGY REPORT*  Clinical Data: Right hip pain.  CT OF THE RIGHT HIP WITHOUT CONTRAST  Technique:  Multidetector CT imaging was performed according to the standard protocol. Multiplanar CT image reconstructions were also generated.  Comparison: Right hip radiographs performed earlier today at 09:00 p.m., and CT of the pelvis performed 02/15/2006  Findings: There is no evidence of fracture or dislocation.  The right femoral head and neck appear intact.  If the patient's symptoms persist, MRI could be considered for further evaluation, to assess for occult fracture.  The right femoral head remains seated at the acetabulum.  The right pubic rami appear intact.  The right sacroiliac joint demonstrates vacuum phenomenon.  Soft tissue injury is noted along the right flank, lateral to the right greater femoral trochanter.  The visualized soft  tissues are otherwise grossly unremarkable.  Scattered the lesser and within the pelvis.  The visualized portions of the bladder are within normal limits.  The musculature of the proximal right thigh is unremarkable in appearance.  IMPRESSION:  1.  No evidence of fracture or dislocation.  If the patient's symptoms persist, MRI could be considered for further evaluation, to assess for occult fracture. 2.  Soft tissue injury along the right flank, lateral to the right greater femoral trochanter.   Original Report Authenticated By: Tonia Ghent, M.D.    MDM   1. Right hip pain    Patient present with right hip pain. X-ray of the hip showed degenerative changes, but no acute abnormality. CT of the right hip was done which showed a soft tissue injury. I discussed this with the patient as well as informing her that it is possible she will need an MRI in the future. She needs to followup with her primary care physician for this. She will be discharged home with instructions to ice the area. She was given a short course of pain medication. She is neurovascularly intact, compartments are soft. Dr. Fonnie Jarvis evaluated this patient and agrees with plan. Return structures given. Vital signs stable for discharge. Patient / Family / Caregiver informed of clinical course, understand medical decision-making process, and agree with plan.     Mora Bellman, PA-C 08/20/13 [phone]

## 2013-08-19 NOTE — ED Provider Notes (Signed)
Medical screening examination/treatment/procedure(s) were conducted as a shared visit with non-physician practitioner(s) and myself.  I personally evaluated the patient during the encounter  No trauma but a few days of well localized nonradiating pain to the right posterior pelvis sacroiliac joint region with no midline lumbar pain no radiation down her leg no hip joint pain no weakness or numbness hip joint itself is nontender with good range of motion in the ED the patient is tender at the right sacroiliac joint area only with lumbar back nontender. Right leg normal light touch good active range of motion right foot nontender dorsalis pedis pulse intact cap refill less than 2 seconds normal light touch  Hurman Horn, MD 08/23/13 2122

## 2013-08-19 NOTE — ED Notes (Signed)
Per pt: hip started hurting on Thursday. She was walking and it just started to hurt. Pain stays in right hip area.

## 2013-10-06 ENCOUNTER — Emergency Department (HOSPITAL_COMMUNITY): Payer: Medicare Other

## 2013-10-06 ENCOUNTER — Emergency Department (HOSPITAL_COMMUNITY)
Admission: EM | Admit: 2013-10-06 | Discharge: 2013-10-07 | Disposition: A | Discharge status: Home / Self Care | Payer: Medicare Other | Attending: Emergency Medicine | Admitting: Emergency Medicine

## 2013-10-06 ENCOUNTER — Encounter (HOSPITAL_COMMUNITY): Payer: Self-pay | Admitting: Emergency Medicine

## 2013-10-06 DIAGNOSIS — E7800 Pure hypercholesterolemia, unspecified: Secondary | ICD-10-CM | POA: Insufficient documentation

## 2013-10-06 DIAGNOSIS — R079 Chest pain, unspecified: Secondary | ICD-10-CM | POA: Insufficient documentation

## 2013-10-06 DIAGNOSIS — IMO0002 Reserved for concepts with insufficient information to code with codable children: Secondary | ICD-10-CM | POA: Insufficient documentation

## 2013-10-06 DIAGNOSIS — R0981 Nasal congestion: Secondary | ICD-10-CM

## 2013-10-06 DIAGNOSIS — R059 Cough, unspecified: Secondary | ICD-10-CM | POA: Insufficient documentation

## 2013-10-06 DIAGNOSIS — R140 Abdominal distension (gaseous): Secondary | ICD-10-CM

## 2013-10-06 DIAGNOSIS — Z9861 Coronary angioplasty status: Secondary | ICD-10-CM | POA: Insufficient documentation

## 2013-10-06 DIAGNOSIS — F3289 Other specified depressive episodes: Secondary | ICD-10-CM | POA: Insufficient documentation

## 2013-10-06 DIAGNOSIS — E669 Obesity, unspecified: Secondary | ICD-10-CM | POA: Insufficient documentation

## 2013-10-06 DIAGNOSIS — R600 Localized edema: Secondary | ICD-10-CM

## 2013-10-06 DIAGNOSIS — R51 Headache: Secondary | ICD-10-CM | POA: Insufficient documentation

## 2013-10-06 DIAGNOSIS — R141 Gas pain: Secondary | ICD-10-CM | POA: Insufficient documentation

## 2013-10-06 DIAGNOSIS — Z79899 Other long term (current) drug therapy: Secondary | ICD-10-CM | POA: Insufficient documentation

## 2013-10-06 DIAGNOSIS — M1990 Unspecified osteoarthritis, unspecified site: Secondary | ICD-10-CM | POA: Insufficient documentation

## 2013-10-06 DIAGNOSIS — R609 Edema, unspecified: Secondary | ICD-10-CM | POA: Insufficient documentation

## 2013-10-06 DIAGNOSIS — Z7982 Long term (current) use of aspirin: Secondary | ICD-10-CM | POA: Insufficient documentation

## 2013-10-06 DIAGNOSIS — R05 Cough: Secondary | ICD-10-CM | POA: Insufficient documentation

## 2013-10-06 DIAGNOSIS — I10 Essential (primary) hypertension: Secondary | ICD-10-CM | POA: Insufficient documentation

## 2013-10-06 DIAGNOSIS — E119 Type 2 diabetes mellitus without complications: Secondary | ICD-10-CM | POA: Insufficient documentation

## 2013-10-06 DIAGNOSIS — R142 Eructation: Secondary | ICD-10-CM | POA: Insufficient documentation

## 2013-10-06 DIAGNOSIS — J3489 Other specified disorders of nose and nasal sinuses: Secondary | ICD-10-CM | POA: Insufficient documentation

## 2013-10-06 DIAGNOSIS — F411 Generalized anxiety disorder: Secondary | ICD-10-CM | POA: Insufficient documentation

## 2013-10-06 DIAGNOSIS — F329 Major depressive disorder, single episode, unspecified: Secondary | ICD-10-CM | POA: Insufficient documentation

## 2013-10-06 LAB — COMPREHENSIVE METABOLIC PANEL
ALT: 78 U/L — ABNORMAL HIGH (ref 0–35)
AST: 86 U/L — ABNORMAL HIGH (ref 0–37)
Alkaline Phosphatase: 57 U/L (ref 39–117)
Calcium: 9.4 mg/dL (ref 8.4–10.5)
GFR calc Af Amer: 71 mL/min — ABNORMAL LOW (ref >90)
Glucose, Bld: 119 mg/dL — ABNORMAL HIGH (ref 70–99)
Potassium: 3.9 mEq/L (ref 3.5–5.1)
Sodium: 136 mEq/L (ref 135–145)
Total Protein: 6.6 g/dL (ref 6.0–8.3)

## 2013-10-06 LAB — CBC
Hemoglobin: 12.7 g/dL (ref 12.0–15.0)
MCHC: 33.2 g/dL (ref 30.0–36.0)
Platelets: 275 10*3/uL (ref 150–400)
RBC: 3.99 MIL/uL (ref 3.87–5.11)

## 2013-10-06 LAB — PRO B NATRIURETIC PEPTIDE: Pro B Natriuretic peptide (BNP): 532.5 pg/mL — ABNORMAL HIGH (ref 0–450)

## 2013-10-06 LAB — TROPONIN I: Troponin I: <0.3 ng/mL (ref <0.30)

## 2013-10-06 MED ORDER — PSEUDOEPHEDRINE HCL ER 120 MG PO TB12
120.0000 mg | ORAL_TABLET | Freq: Two times a day (BID) | ORAL | Status: DC
  Administered 2013-10-06: 120 mg via ORAL
  Filled 2013-10-06: qty 1

## 2013-10-06 MED ORDER — LORATADINE 10 MG PO TABS
10.0000 mg | ORAL_TABLET | Freq: Every day | ORAL | Status: DC
  Administered 2013-10-06: 10 mg via ORAL
  Filled 2013-10-06: qty 1

## 2013-10-06 MED ORDER — SODIUM CHLORIDE 0.9 % IV SOLN
INTRAVENOUS | Status: DC
  Administered 2013-10-06: 20 mL/h via INTRAVENOUS

## 2013-10-06 MED ORDER — TRAMADOL HCL 50 MG PO TABS
50.0000 mg | ORAL_TABLET | Freq: Once | ORAL | Status: AC
  Administered 2013-10-06: 50 mg via ORAL
  Filled 2013-10-06: qty 1

## 2013-10-06 MED ORDER — CETIRIZINE-PSEUDOEPHEDRINE ER 5-120 MG PO TB12: 1.0000 | ORAL_TABLET | Freq: Two times a day (BID) | ORAL | Status: DC | PRN

## 2013-10-06 NOTE — ED Notes (Signed)
Patient is alert and oriented x3.  She has multiple complaints which include headache, oral blisters, bloating,  Shortness of breath and leg swelling.  She was recently placed on a prednisone pack by her PCP.  Currently She rates her pain at 9 of 10

## 2013-10-06 NOTE — ED Notes (Signed)
MD at bedside. 

## 2013-10-06 NOTE — ED Notes (Addendum)
Pt.'s told  MD of her concerns/ problems if being  discharge tonight without getting enough pain medicine for her left lower leg and left ankle, being still feeling bloated ad the sores in her mouth. MD explained that pt. [facility] the swelling of said left leg/ankle. Pt. Was still anxious and liked to have her pain controlled before being discharged. Pt. Is currently on Tramadol and was advised to continue takig her pain med.

## 2013-10-06 NOTE — ED Provider Notes (Signed)
CSN: [id number]     Arrival date & time 10/06/13  2021 History   First MD Initiated Contact with Patient 10/06/13 2125     Chief Complaint  Patient presents with  . Headache  . Bloated   (Consider location/radiation/quality/duration/timing/severity/associated sxs/prior Treatment) Patient is a 77 y.o. female presenting with headaches. The history is provided by the patient and a relative.  Headache Associated symptoms: cough and sinus pressure   Associated symptoms: no diarrhea, no fever, no neck pain, no numbness and no vomiting   pt with hx htn, dm, anxiety, c/o multiple symptoms in past week. States was seen in ed previously w chrnoic back pain, was told had 'arthritis' in back and given rx prednisone. Since then has noted feeling bloated in stomach, and swelling in bil lower legs and ankles. Denies prior rx w steroid medication. Also notes nasal and sinus congestion and pressure, w facial pain and headaches. Also notes scratchy throat, and states has noted a few small ulcerated areas on tongue. No rash, itching or hives. Denies known ill contacts. No fevers, but says felt hot. States has also felt tight in chest for the past couple days, constant, at rest. No episodic or exertional cp. No associated nv, or diaphoresis. States feels breathing at baseline, but some sob w occasional coughing spell. Denies orthopnea, no hx chf. Denies hx cad. No pleuritic pain. No hx dvt or pe.  States she saw her pcp w above symptoms a few days ago but no specific dx, and was given med for symptom relief of nasal congestion, and oral ulcers.     Past Medical History  Diagnosis Date  . Hypertension   . Hypercholesterolemia   . Obesity   . Normal nuclear stress test Jan 2012    No ischemia. EF 81%  . Sleep apnea     DOES NOT USE CPAP-UNABLE TO TOLERATE  . Anxiety   . Depression   . Diabetes mellitus     ORAL MED  . Osteopenia   . Shortness of breath     WITH EXERTION  . Hypothyroidism     STATES SHE  NO LONGER NEEDS THYROID SUPPLEMENT  . Osteoarthritis (arthritis due to wear and tear of joints)     PAIN AND OA BOTH KNEES  . Urinary frequency   . Frequency of urination   . Nocturia    Past Surgical History  Procedure Laterality Date  . Vesicovaginal fistula closure w/ tah    . Thyroidectomy, partial    . Cardiac catheterization  02/03/1999    EF 70%  . US echocardiography  08/21/2004    EF 60-65%  . Cardiovascular stress test  11/30/2010    EF 81%, NORMAL  . Bilateral oophorectomy    . Abdominal hysterectomy    . Total knee arthroplasty  04/21/2012    Procedure: TOTAL KNEE ARTHROPLASTY;  Surgeon: Loanne Drilling, MD;  Location: [facility];  Service: Orthopedics;  Laterality: Right;  . Orif ankle fracture  2004  . Total knee arthroplasty Left 01/12/2013    Procedure: TOTAL KNEE ARTHROPLASTY;  Surgeon: Loanne Drilling, MD;  Location: [facility];  Service: Orthopedics;  Laterality: Left;   Family History  Problem Relation Age of Onset  . Heart disease Father   . Hypertension Father   . Heart attack Father   . Colon cancer Mother   . Heart attack Mother   . Hypertension Mother   . Breast cancer Sister    History  Substance Use Topics  .  Smoking status: Never Smoker   . Smokeless tobacco: Not on file  . Alcohol Use: No   OB History   Grav Para Term Preterm Abortions TAB SAB Ect Mult Living                 Review of Systems  Constitutional: Negative for fever and chills.  HENT: Positive for mouth sores, rhinorrhea and sinus pressure. Negative for trouble swallowing.   Eyes: Negative for redness.  Respiratory: Positive for cough. Negative for shortness of breath.   Cardiovascular: Positive for chest pain and leg swelling.  Gastrointestinal: Negative for vomiting, diarrhea and constipation.  Genitourinary: Negative for dysuria and flank pain.  Musculoskeletal: Negative for neck pain.  Skin: Negative for rash.  Neurological: Positive for headaches. Negative for weakness and  numbness.  Hematological: Does not bruise/bleed easily.  Psychiatric/Behavioral: Negative for confusion.    Allergies  Ibuprofen and Sulfonamide derivatives  Home Medications   Current Outpatient Rx  Name  Route  Sig  Dispense  Refill  . acetaminophen (TYLENOL) 325 MG tablet   Oral   Take 650 mg by mouth every 6 (six) hours as needed (pain).         Marland Kitchen acetaminophen (TYLENOL) 500 MG tablet   Oral   Take 500 mg by mouth every 6 (six) hours as needed for pain.         Marland Kitchen aspirin EC 81 MG tablet   Oral   Take 81 mg by mouth daily.         Marland Kitchen atorvastatin (LIPITOR) 20 MG tablet   Oral   Take 20 mg by mouth daily before supper.          . Calcium Carbonate (CALTRATE 600 PO)   Oral   Take 1 tablet by mouth 2 (two) times daily.         . Cholecalciferol (VITAMIN D PO)   Oral   Take 1 tablet by mouth daily.         Marland Kitchen Fesoterodine Fumarate (TOVIAZ) 8 MG TB24   Oral   Take 8 mg by mouth daily before supper.          . losartan-hydrochlorothiazide (HYZAAR) 100-25 MG per tablet   Oral   Take 1 tablet by mouth every morning.         . memantine (NAMENDA) 10 MG tablet   Oral   Take 10 mg by mouth 2 (two) times daily.         . metFORMIN (GLUCOPHAGE) 500 MG tablet   Oral   Take 500 mg by mouth 2 (two) times daily.          . mirtazapine (REMERON) 15 MG tablet   Oral   Take 15 mg by mouth at bedtime.         . Multiple Vitamin (MULTIVITAMIN WITH MINERALS) TABS tablet   Oral   Take 1 tablet by mouth daily.         Marland Kitchen oxyCODONE-acetaminophen (PERCOCET/ROXICET) 5-325 MG per tablet   Oral   Take 1 tablet by mouth every 6 (six) hours as needed for pain.   12 tablet   0   . oxymetazoline (AFRIN NASAL SPRAY) 0.05 % nasal spray   Each Nare   Place 1 spray into both nostrils 2 (two) times daily.         . predniSONE (STERAPRED UNI-PAK) 10 MG tablet   Oral   Take 10 tablets by mouth daily. Pt unsure of direction  But is  A 12 day pack         .  sertraline (ZOLOFT) 50 MG tablet   Oral   Take 50 mg by mouth every morning.          . traMADol (ULTRAM) 50 MG tablet   Oral   Take 50-100 mg by mouth every 6 (six) hours as needed for pain.          Marland Kitchen triamcinolone (NASACORT AQ) 55 MCG/ACT AERO nasal inhaler   Nasal   Place 1-2 sprays into the nose daily.          BP 95/64  Pulse 84  Temp(Src) 97.8 F (36.6 C) (Oral)  Resp 18  SpO2 98% Physical Exam  Nursing note and vitals reviewed. Constitutional: She is oriented to person, place, and time. She appears well-developed and well-nourished. No distress.  HENT:  Head: Atraumatic.  Nose: Nose normal.  Mouth/Throat: Oropharynx is clear and moist.  No temporal tenderness. Pt notes sinus congestion and bil maxillary and frontal sinus area pressure/pain. No facial sts or erythema.   ?couple tiny superficial ulcers to lateral edge tongue. No sores to lip or posterior pharynx.   Eyes: Conjunctivae and EOM are normal. Pupils are equal, round, and reactive to light. No scleral icterus.  Neck: Neck supple. No tracheal deviation present. No thyromegaly present.  No stiffness or rigidity.   Cardiovascular: Normal rate, regular rhythm, normal heart sounds and intact distal pulses.  Exam reveals no gallop and no friction rub.   No murmur heard. Pulmonary/Chest: Effort normal and breath sounds normal. No respiratory distress.  Abdominal: Soft. Normal appearance and bowel sounds are normal. She exhibits no distension and no mass. There is no tenderness. There is no rebound and no guarding.  obese  Genitourinary:  No cva tenderness.  Musculoskeletal: Normal range of motion. She exhibits no tenderness.  Bilateral lower leg, ankle and foot edema, left lower leg/foot more pronounced that right. Pt wearing compression stockings.   Lymphadenopathy:    She has no cervical adenopathy.  Neurological: She is alert and oriented to person, place, and time. No cranial nerve deficit.  Motor intact  bilaterally.   Skin: Skin is warm and dry. No rash noted. She is not diaphoretic.  Psychiatric:  Mildly anxious    ED Course  Procedures (including critical care time)  Results for orders placed during the hospital encounter of 10/06/13  TROPONIN I      Result Value Range   Troponin I <0.30  <0.30 ng/mL  PRO B NATRIURETIC PEPTIDE      Result Value Range   Pro B Natriuretic peptide (BNP) 532.5 (*) 0 - 450 pg/mL  CBC      Result Value Range   WBC 12.2 (*) 4.0 - 10.5 K/uL   RBC 3.99  3.87 - 5.11 MIL/uL   Hemoglobin 12.7  12.0 - 15.0 g/dL   HCT 04.5  40.9 - 81.1 %   MCV 96.0  78.0 - 100.0 fL   MCH 31.8  26.0 - 34.0 pg   MCHC 33.2  30.0 - 36.0 g/dL   RDW 91.4  78.2 - 95.6 %   Platelets 275  150 - 400 K/uL  COMPREHENSIVE METABOLIC PANEL      Result Value Range   Sodium 136  135 - 145 mEq/L   Potassium 3.9  3.5 - 5.1 mEq/L   Chloride 98  96 - 112 mEq/L   CO2 23  19 - 32 mEq/L  Glucose, Bld 119 (*) 70 - 99 mg/dL   BUN 27 (*) 6 - 23 mg/dL   Creatinine, Ser 9.60  0.50 - 1.10 mg/dL   Calcium 9.4  8.4 - 45.4 mg/dL   Total Protein 6.6  6.0 - 8.3 g/dL   Albumin 3.5  3.5 - 5.2 g/dL   AST 86 (*) 0 - 37 U/L   ALT 78 (*) 0 - 35 U/L   Alkaline Phosphatase 57  39 - 117 U/L   Total Bilirubin 0.4  0.3 - 1.2 mg/dL   GFR calc non Af Amer 61 (*) >90 mL/min   GFR calc Af Amer 71 (*) >90 mL/min   Dg Chest 2 View  10/06/2013   CLINICAL DATA:  Shortness of breath, extremity swelling  EXAM: CHEST  2 VIEW  COMPARISON:  04/18/2012  FINDINGS: Moderate cardiomegaly. Vascular pattern normal. Lungs clear. No pleural effusions.  IMPRESSION: No active cardiopulmonary disease.   Electronically Signed   By: Esperanza Heir M.D.   On: 10/06/2013 22:11   Ct Head Wo Contrast  10/06/2013   CLINICAL DATA:  Weakness, headache  EXAM: CT HEAD WITHOUT CONTRAST  TECHNIQUE: Contiguous axial images were obtained from the base of the skull through the vertex without intravenous contrast.  COMPARISON:  11/18/2012   FINDINGS: Moderate to severe atrophy. Mild low attenuation in the deep white matter. No hemorrhage or extra-axial fluid. No evidence of vascular territory infarct. Ventricles are prominent proportional to atrophy. Calvarium is intact.  IMPRESSION: Chronic involutional change.  No acute findings.   Electronically Signed   By: Esperanza Heir M.D.   On: 10/06/2013 22:06     EKG Interpretation   None       MDM  Iv ns. Labs. Xr.  Reviewed nursing notes and prior charts for additional history.   Ultram po.  As ?fluid retention from recent pred rx, no radicular symptoms or clear indicating to continue pred, rec hold/stop pred.  Also discussed measures to help dependent edema.  As pt notes asymmetric bil leg swelling w left > right, will bring back in am to get dopplers r/o dvt.  Pt also instructed on importance close pcp follow up given multiple ongoing symptoms.  Pt appears stable for d/c.     Suzi Roots, MD 10/08/13 1057

## 2013-10-06 NOTE — ED Notes (Signed)
Patient transported to X-ray 

## 2013-10-07 ENCOUNTER — Emergency Department (HOSPITAL_COMMUNITY)
Admission: EM | Admit: 2013-10-07 | Discharge: 2013-10-07 | Disposition: A | Discharge status: Home / Self Care | Payer: Medicare Other | Attending: Emergency Medicine | Admitting: Emergency Medicine

## 2013-10-07 ENCOUNTER — Ambulatory Visit (HOSPITAL_COMMUNITY)
Admission: RE | Admit: 2013-10-07 | Discharge: 2013-10-07 | Disposition: A | Discharge status: Home / Self Care | Payer: Medicare Other | Source: Ambulatory Visit | Attending: Emergency Medicine | Admitting: Emergency Medicine

## 2013-10-07 ENCOUNTER — Encounter (HOSPITAL_COMMUNITY): Payer: Self-pay | Admitting: Emergency Medicine

## 2013-10-07 DIAGNOSIS — IMO0002 Reserved for concepts with insufficient information to code with codable children: Secondary | ICD-10-CM | POA: Insufficient documentation

## 2013-10-07 DIAGNOSIS — M899 Disorder of bone, unspecified: Secondary | ICD-10-CM | POA: Insufficient documentation

## 2013-10-07 DIAGNOSIS — F411 Generalized anxiety disorder: Secondary | ICD-10-CM | POA: Insufficient documentation

## 2013-10-07 DIAGNOSIS — I10 Essential (primary) hypertension: Secondary | ICD-10-CM | POA: Insufficient documentation

## 2013-10-07 DIAGNOSIS — M1710 Unilateral primary osteoarthritis, unspecified knee: Secondary | ICD-10-CM | POA: Insufficient documentation

## 2013-10-07 DIAGNOSIS — F3289 Other specified depressive episodes: Secondary | ICD-10-CM | POA: Insufficient documentation

## 2013-10-07 DIAGNOSIS — R609 Edema, unspecified: Secondary | ICD-10-CM | POA: Insufficient documentation

## 2013-10-07 DIAGNOSIS — E669 Obesity, unspecified: Secondary | ICD-10-CM | POA: Insufficient documentation

## 2013-10-07 DIAGNOSIS — F329 Major depressive disorder, single episode, unspecified: Secondary | ICD-10-CM | POA: Insufficient documentation

## 2013-10-07 DIAGNOSIS — M1990 Unspecified osteoarthritis, unspecified site: Secondary | ICD-10-CM

## 2013-10-07 DIAGNOSIS — M25579 Pain in unspecified ankle and joints of unspecified foot: Secondary | ICD-10-CM | POA: Insufficient documentation

## 2013-10-07 DIAGNOSIS — M7989 Other specified soft tissue disorders: Secondary | ICD-10-CM | POA: Insufficient documentation

## 2013-10-07 DIAGNOSIS — R142 Eructation: Secondary | ICD-10-CM | POA: Insufficient documentation

## 2013-10-07 DIAGNOSIS — R141 Gas pain: Secondary | ICD-10-CM | POA: Insufficient documentation

## 2013-10-07 DIAGNOSIS — J029 Acute pharyngitis, unspecified: Secondary | ICD-10-CM | POA: Insufficient documentation

## 2013-10-07 DIAGNOSIS — R51 Headache: Secondary | ICD-10-CM | POA: Insufficient documentation

## 2013-10-07 DIAGNOSIS — Z7982 Long term (current) use of aspirin: Secondary | ICD-10-CM | POA: Insufficient documentation

## 2013-10-07 DIAGNOSIS — E7800 Pure hypercholesterolemia, unspecified: Secondary | ICD-10-CM | POA: Insufficient documentation

## 2013-10-07 DIAGNOSIS — M79609 Pain in unspecified limb: Secondary | ICD-10-CM | POA: Insufficient documentation

## 2013-10-07 DIAGNOSIS — E119 Type 2 diabetes mellitus without complications: Secondary | ICD-10-CM | POA: Insufficient documentation

## 2013-10-07 DIAGNOSIS — R42 Dizziness and giddiness: Secondary | ICD-10-CM | POA: Insufficient documentation

## 2013-10-07 DIAGNOSIS — Z79899 Other long term (current) drug therapy: Secondary | ICD-10-CM | POA: Insufficient documentation

## 2013-10-07 MED ORDER — FLUTICASONE PROPIONATE 50 MCG/ACT NA SUSP: 2.0000 | Freq: Every day | NASAL | Status: DC

## 2013-10-07 MED ORDER — MECLIZINE HCL 12.5 MG PO TABS
12.5000 mg | ORAL_TABLET | Freq: Three times a day (TID) | ORAL | Status: DC | PRN
Start: 2013-10-07 — End: 2013-11-02

## 2013-10-07 MED ORDER — METOCLOPRAMIDE HCL 5 MG/ML IJ SOLN
5.0000 mg | Freq: Once | INTRAMUSCULAR | Status: AC
  Administered 2013-10-07: 5 mg via INTRAMUSCULAR
  Filled 2013-10-07: qty 2

## 2013-10-07 MED ORDER — HYDROCODONE-ACETAMINOPHEN 5-325 MG PO TABS
1.0000 | ORAL_TABLET | Freq: Once | ORAL | Status: AC
  Administered 2013-10-07: 1 via ORAL
  Filled 2013-10-07: qty 1

## 2013-10-07 NOTE — ED Notes (Signed)
Pt states she was seen at [facility] for eval of ankle pain. Pt was scanned and it was determined she does not have a blood clot. Pt c/o headache and dizziness x couple days. Pt denies n/v/d. Pt states she has been on prednisone and now has sores in her mouth on both sides. Pt states he throat is sore. Pt states she feels "swimmy headed" and like the room is spinning around her. Pt states her leg still hurts and she is worried about the swelling. Pt alert and mentating appropriately.

## 2013-10-07 NOTE — Progress Notes (Signed)
VASCULAR LAB PRELIMINARY  PRELIMINARY  PRELIMINARY  PRELIMINARY  Bilateral lower extremity venous duplex  completed.    Preliminary report:  Bilateral:  No evidence of DVT, superficial thrombosis, or Baker's Cyst.    Antino Mayabb, RVT 10/07/2013, 9:10 AM

## 2013-10-07 NOTE — ED Provider Notes (Signed)
CSN: [id number]     Arrival date & time 10/07/13  0919 History   First MD Initiated Contact with Patient 10/07/13 [phone]     Chief Complaint  Patient presents with  . Headache  . Ankle Pain  . Dizziness  . Sore Throat   (Consider location/radiation/quality/duration/timing/severity/associated sxs/prior Treatment) HPI Comments: Patient returns to the ER for repeat evaluation. Patient was evaluated recently for multiple areas of joint pain and started on prednisone. She was seen in the ER yesterday at [facility] long. At that time she was experiencing headache, dizziness which is described as the room spinning. She was also experiencing abdominal discomfort, described as bloating. She was told that she might be having a reaction to the prednisone. Patient was scheduled for a venous duplex this morning to rule out blood clot because she has swelling in the left lower leg without swelling in the right. Duplex was performed and is negative for DVT. Patient here in the ER complaining of continued headache as well as dizziness.  Patient is a 77 y.o. female presenting with headaches, ankle pain, and pharyngitis.  Headache Associated symptoms: dizziness   Ankle Pain Sore Throat Associated symptoms include headaches.    Past Medical History  Diagnosis Date  . Hypertension   . Hypercholesterolemia   . Obesity   . Normal nuclear stress test Jan 2012    No ischemia. EF 81%  . Sleep apnea     DOES NOT USE CPAP-UNABLE TO TOLERATE  . Anxiety   . Depression   . Diabetes mellitus     ORAL MED  . Osteopenia   . Shortness of breath     WITH EXERTION  . Hypothyroidism     STATES SHE NO LONGER NEEDS THYROID SUPPLEMENT  . Osteoarthritis (arthritis due to wear and tear of joints)     PAIN AND OA BOTH KNEES  . Urinary frequency   . Frequency of urination   . Nocturia    Past Surgical History  Procedure Laterality Date  . Vesicovaginal fistula closure w/ tah    . Thyroidectomy, partial    . Cardiac  catheterization  02/03/1999    EF 70%  . US echocardiography  08/21/2004    EF 60-65%  . Cardiovascular stress test  11/30/2010    EF 81%, NORMAL  . Bilateral oophorectomy    . Abdominal hysterectomy    . Total knee arthroplasty  04/21/2012    Procedure: TOTAL KNEE ARTHROPLASTY;  Surgeon: Loanne Drilling, MD;  Location: [facility];  Service: Orthopedics;  Laterality: Right;  . Orif ankle fracture  2004  . Total knee arthroplasty Left 01/12/2013    Procedure: TOTAL KNEE ARTHROPLASTY;  Surgeon: Loanne Drilling, MD;  Location: [facility];  Service: Orthopedics;  Laterality: Left;   Family History  Problem Relation Age of Onset  . Heart disease Father   . Hypertension Father   . Heart attack Father   . Colon cancer Mother   . Heart attack Mother   . Hypertension Mother   . Breast cancer Sister    History  Substance Use Topics  . Smoking status: Never Smoker   . Smokeless tobacco: Not on file  . Alcohol Use: No   OB History   Grav Para Term Preterm Abortions TAB SAB Ect Mult Living                 Review of Systems  Gastrointestinal:       "Bloating"  Musculoskeletal: Positive for arthralgias.  Skin: Negative.   Neurological: Positive for dizziness and headaches.  All other systems reviewed and are negative.    Allergies  Ibuprofen and Sulfonamide derivatives  Home Medications   Current Outpatient Rx  Name  Route  Sig  Dispense  Refill  . acetaminophen (TYLENOL) 500 MG tablet   Oral   Take 500 mg by mouth every 6 (six) hours as needed for pain.         Marland Kitchen aspirin EC 81 MG tablet   Oral   Take 81 mg by mouth daily.         Marland Kitchen atorvastatin (LIPITOR) 20 MG tablet   Oral   Take 20 mg by mouth daily before supper.          . Calcium Carbonate (CALTRATE 600 PO)   Oral   Take 1 tablet by mouth 2 (two) times daily.         . cetirizine-pseudoephedrine (ZYRTEC-D) 5-120 MG per tablet   Oral   Take 1 tablet by mouth 2 (two) times daily as needed for allergies.   20  tablet   0   . Cholecalciferol (VITAMIN D PO)   Oral   Take 1 tablet by mouth daily.         Marland Kitchen Fesoterodine Fumarate (TOVIAZ) 8 MG TB24   Oral   Take 8 mg by mouth daily before supper.          . losartan-hydrochlorothiazide (HYZAAR) 100-25 MG per tablet   Oral   Take 1 tablet by mouth every morning.         . memantine (NAMENDA) 10 MG tablet   Oral   Take 10 mg by mouth 2 (two) times daily.         . metFORMIN (GLUCOPHAGE) 500 MG tablet   Oral   Take 500 mg by mouth 2 (two) times daily.          . mirtazapine (REMERON) 15 MG tablet   Oral   Take 15 mg by mouth at bedtime.         . Multiple Vitamin (MULTIVITAMIN WITH MINERALS) TABS tablet   Oral   Take 1 tablet by mouth daily.         Marland Kitchen oxyCODONE-acetaminophen (PERCOCET/ROXICET) 5-325 MG per tablet   Oral   Take 1 tablet by mouth every 6 (six) hours as needed for pain.   12 tablet   0   . oxymetazoline (AFRIN NASAL SPRAY) 0.05 % nasal spray   Each Nare   Place 1 spray into both nostrils 2 (two) times daily.         . predniSONE (STERAPRED UNI-PAK) 10 MG tablet   Oral   Take 10 tablets by mouth daily. Pt unsure of direction   But is  A 12 day pack         . sertraline (ZOLOFT) 50 MG tablet   Oral   Take 50 mg by mouth every morning.          . traMADol (ULTRAM) 50 MG tablet   Oral   Take 50-100 mg by mouth every 6 (six) hours as needed for pain.          Marland Kitchen triamcinolone (NASACORT AQ) 55 MCG/ACT AERO nasal inhaler   Nasal   Place 1-2 sprays into the nose daily.         Marland Kitchen acetaminophen (TYLENOL) 325 MG tablet   Oral   Take 650 mg by mouth every 6 (six) hours as  needed (pain).          BP 147/72  Pulse 79  Temp(Src) 97 F (36.1 C) (Oral)  Resp 22  SpO2 97% Physical Exam  Constitutional: She is oriented to person, place, and time. She appears well-developed and well-nourished. No distress.  HENT:  Head: Normocephalic and atraumatic.  Right Ear: Hearing normal.  Left Ear:  Hearing normal.  Nose: Nose normal.  Mouth/Throat: Oropharynx is clear and moist and mucous membranes are normal.  Eyes: Conjunctivae and EOM are normal. Pupils are equal, round, and reactive to light.  Neck: Normal range of motion. Neck supple.  Cardiovascular: Regular rhythm, S1 normal and S2 normal.  Exam reveals no gallop and no friction rub.   No murmur heard. Pulmonary/Chest: Effort normal and breath sounds normal. No respiratory distress. She exhibits no tenderness.  Abdominal: Soft. Normal appearance and bowel sounds are normal. There is no hepatosplenomegaly. There is no tenderness. There is no rebound, no guarding, no tenderness at McBurney's point and negative Murphy's sign. No hernia.  Musculoskeletal: Normal range of motion. She exhibits edema (pitting, left lower leg).       Left ankle: She exhibits swelling. Tenderness.  Diffuse pitting edema of left lower leg. Focal tenderness diffusely of the ankle. No overlying erythema, warmth or skin changes  Neurological: She is alert and oriented to person, place, and time. She has normal strength. No cranial nerve deficit or sensory deficit. Coordination normal. GCS eye subscore is 4. GCS verbal subscore is 5. GCS motor subscore is 6.  Skin: Skin is warm, dry and intact. No rash noted. No cyanosis.  Psychiatric: She has a normal mood and affect. Her speech is normal and behavior is normal. Thought content normal.    ED Course  Procedures (including critical care time) Labs Review Labs Reviewed - No data to display Imaging Review Dg Chest 2 View  10/06/2013   CLINICAL DATA:  Shortness of breath, extremity swelling  EXAM: CHEST  2 VIEW  COMPARISON:  04/18/2012  FINDINGS: Moderate cardiomegaly. Vascular pattern normal. Lungs clear. No pleural effusions.  IMPRESSION: No active cardiopulmonary disease.   Electronically Signed   By: Esperanza Heir M.D.   On: 10/06/2013 22:11   Ct Head Wo Contrast  10/06/2013   CLINICAL DATA:  Weakness,  headache  EXAM: CT HEAD WITHOUT CONTRAST  TECHNIQUE: Contiguous axial images were obtained from the base of the skull through the vertex without intravenous contrast.  COMPARISON:  11/18/2012  FINDINGS: Moderate to severe atrophy. Mild low attenuation in the deep white matter. No hemorrhage or extra-axial fluid. No evidence of vascular territory infarct. Ventricles are prominent proportional to atrophy. Calvarium is intact.  IMPRESSION: Chronic involutional change.  No acute findings.   Electronically Signed   By: Esperanza Heir M.D.   On: 10/06/2013 22:06    EKG Interpretation   None       MDM  Diagnosis: 1. Headache 2. Dizziness/vertigo 3. Arthralgias  Patient presents to the ER for multiple complaints. Patient was seen yesterday and had a very thorough and appropriate workup. Her headache was addressed by CAT scan, no acute abnormalities were seen. Patient's swelling the leg was also addressed. Chest x-ray and blood work were performed and there is no evidence of acute congestive heart failure. Additionally, patient had a venous duplex this morning and no DVT is seen. This is consistent with unilateral venous stasis. There is no sign of infection. Pulses are palpable, no arterial compromise. No further workup necessary.  She  complaining of continued headache and dizziness. She now is reporting sore throat associated with the symptoms. Possible viral upper respiratory infection present. She does report some coughing, and this has exacerbated her headache. At this point, treatment for headache and dizziness, occasionally vertigo is appropriate. Followup with primary doctor.    Gilda Crease, MD 10/07/13 1036

## 2013-10-18 ENCOUNTER — Encounter: Payer: Self-pay | Admitting: Neurology

## 2013-10-23 ENCOUNTER — Encounter: Payer: Self-pay | Admitting: Neurology

## 2013-10-23 ENCOUNTER — Encounter (INDEPENDENT_AMBULATORY_CARE_PROVIDER_SITE_OTHER): Payer: Self-pay

## 2013-10-23 ENCOUNTER — Telehealth: Payer: Self-pay

## 2013-10-23 ENCOUNTER — Ambulatory Visit (INDEPENDENT_AMBULATORY_CARE_PROVIDER_SITE_OTHER): Payer: Medicare Other | Admitting: Neurology

## 2013-10-23 VITALS — BP 135/62 | HR 107 | Ht 63.0 in | Wt 227.0 lb

## 2013-10-23 DIAGNOSIS — R51 Headache: Secondary | ICD-10-CM

## 2013-10-23 DIAGNOSIS — R519 Headache, unspecified: Secondary | ICD-10-CM | POA: Insufficient documentation

## 2013-10-23 MED ORDER — GABAPENTIN 100 MG PO CAPS: 100.0000 mg | ORAL_CAPSULE | Freq: Three times a day (TID) | ORAL | Status: DC

## 2013-10-23 NOTE — Patient Instructions (Signed)

## 2013-10-23 NOTE — Telephone Encounter (Signed)
Documentation noted, agree with assessment.

## 2013-10-23 NOTE — Telephone Encounter (Signed)
Patient had just finished OV with Dr. Anne Hahn. She had forgotten to ask him about the lateral aspect of her left hand that was slightly swollen. I assessed the area. There was slight swelling and redness compared to right hand. Patient had just a minimal of a sensation of discomfort that did not change with palpation. She has full ROM of her wrist and thumb. Redness is localized. Patient has a watch on her left wrist that appears tight. She does not have pain in the rest of her hand, wrist, arm or chest. I have asked her to remove her watch when she gets home. Elevate her hand on a pillow and apply a cool washcloth. I have asked her to contact her primary physician if symptoms continue or worsen tomorrow. If she has worsening swelling or pain that radiates up her arm or into her chest, patient should seek more urgent care.

## 2013-10-23 NOTE — Progress Notes (Signed)
Reason for visit: Headache  Adrienne [name] is a 77 y.o. female  History of present illness:  Adrienne [name] is a 77 year old right-handed white female with a history of a mild memory disturbance, seen previously through this office. The patient comes into the office today complaining of a headache that began after her left total knee replacement in February of 2014. The patient indicates that the headaches are daily in nature, and have been daily for least 3 months. The headaches are better in the morning, worse as the day goes on. The headaches are bifrontal in nature, associated with a throbbing pain. The patient does not report any visual blurring or visual disturbance with the headache, or problems with nausea or vomiting. The patient denies any significant scalp tenderness. The patient does not restrict her activities because of the headache. The patient has been taking Fioricet and Ultram if needed for the headache. The patient underwent a CT scan of the brain that was done on 10/06/2013. This shows stable chronic small vessel disease, cortical atrophy. The images were reviewed online. The patient denies any focal numbness or weakness of the face, arms, or legs. The patient has had a two-month history of blisters in the mouth, and burning and stinging of the tip of the tongue. The patient also reports swelling in the legs, left greater than right. A venous Doppler study was done, and a review of the report was normal. The patient is sent to this office for an evaluation of the headache. The patient denies neck stiffness, sinus drainage, or allergy symptoms with the headache.  Past Medical History  Diagnosis Date  . Hypertension   . Hypercholesterolemia   . Obesity   . Normal nuclear stress test Jan 2012    No ischemia. EF 81%  . Sleep apnea     DOES NOT USE CPAP-UNABLE TO TOLERATE  . Anxiety   . Depression   . Diabetes mellitus     ORAL MED  . Osteopenia   . Shortness of breath     WITH EXERTION  . Hypothyroidism     STATES SHE NO LONGER NEEDS THYROID SUPPLEMENT  . Osteoarthritis (arthritis due to wear and tear of joints)     PAIN AND OA BOTH KNEES  . Urinary frequency   . Frequency of urination   . Nocturia   . Headache(784.0) 10/23/2013    Past Surgical History  Procedure Laterality Date  . Vesicovaginal fistula closure w/ tah    . Thyroidectomy, partial    . Cardiac catheterization  02/03/1999    EF 70%  . US echocardiography  08/21/2004    EF 60-65%  . Cardiovascular stress test  11/30/2010    EF 81%, NORMAL  . Bilateral oophorectomy    . Abdominal hysterectomy    . Total knee arthroplasty  04/21/2012    Procedure: TOTAL KNEE ARTHROPLASTY;  Surgeon: Loanne Drilling, MD;  Location: [facility];  Service: Orthopedics;  Laterality: Right;  . Orif ankle fracture  2004  . Total knee arthroplasty Left 01/12/2013    Procedure: TOTAL KNEE ARTHROPLASTY;  Surgeon: Loanne Drilling, MD;  Location: [facility];  Service: Orthopedics;  Laterality: Left;    Family History  Problem Relation Age of Onset  . Heart disease Father   . Hypertension Father   . Heart attack Father   . Colon cancer Mother   . Heart attack Mother   . Hypertension Mother   . Breast cancer Sister  Social history:  reports that she has never smoked. She has never used smokeless tobacco. She reports that she does not drink alcohol or use illicit drugs.  Medications:  Current Outpatient Prescriptions on File Prior to Visit  Medication Sig Dispense Refill  . acetaminophen (TYLENOL) 325 MG tablet Take 650 mg by mouth every 6 (six) hours as needed (pain).      Marland Kitchen acetaminophen (TYLENOL) 500 MG tablet Take 500 mg by mouth every 6 (six) hours as needed for pain.      Marland Kitchen aspirin EC 81 MG tablet Take 81 mg by mouth daily.      Marland Kitchen atorvastatin (LIPITOR) 20 MG tablet Take 20 mg by mouth daily before supper.       . Calcium Carbonate (CALTRATE 600 PO) Take 1 tablet by mouth 2 (two) times daily.      .  cetirizine-pseudoephedrine (ZYRTEC-D) 5-120 MG per tablet Take 1 tablet by mouth 2 (two) times daily as needed for allergies.  20 tablet  0  . Cholecalciferol (VITAMIN D PO) Take 1 tablet by mouth daily.      Marland Kitchen Fesoterodine Fumarate (TOVIAZ) 8 MG TB24 Take 8 mg by mouth daily before supper.       . fluticasone (FLONASE) 50 MCG/ACT nasal spray Place 2 sprays into both nostrils daily.  16 g  0  . losartan-hydrochlorothiazide (HYZAAR) 100-25 MG per tablet Take 0.5 tablets by mouth every morning.       . meclizine (ANTIVERT) 12.5 MG tablet Take 1 tablet (12.5 mg total) by mouth 3 (three) times daily as needed for dizziness.  30 tablet  0  . memantine (NAMENDA) 10 MG tablet Take 10 mg by mouth 2 (two) times daily.      . metFORMIN (GLUCOPHAGE) 500 MG tablet Take 500 mg by mouth 2 (two) times daily.       . Multiple Vitamin (MULTIVITAMIN WITH MINERALS) TABS tablet Take 1 tablet by mouth daily.      . ondansetron (ZOFRAN) 4 MG tablet Take 4 mg by mouth daily.      Marland Kitchen oxyCODONE-acetaminophen (PERCOCET/ROXICET) 5-325 MG per tablet Take 1 tablet by mouth every 6 (six) hours as needed for pain.  12 tablet  0  . oxymetazoline (AFRIN NASAL SPRAY) 0.05 % nasal spray Place 1 spray into both nostrils 2 (two) times daily.      . sertraline (ZOLOFT) 50 MG tablet Take 50 mg by mouth every morning. TAKE 1/2 OF 50 MG TABLET      . traMADol (ULTRAM) 50 MG tablet Take 50-100 mg by mouth every 6 (six) hours as needed for pain.       Marland Kitchen triamcinolone (KENALOG) 0.1 % paste Use as directed 0.1 g in the mouth or throat daily as needed.      . triamcinolone (NASACORT AQ) 55 MCG/ACT AERO nasal inhaler Place 1-2 sprays into the nose daily.      . mirtazapine (REMERON) 15 MG tablet Take 15 mg by mouth at bedtime. 1/2 TABLET AT BEDTIME      . [DISCONTINUED] darifenacin (ENABLEX) 7.5 MG 24 hr tablet Take 7.5 mg by mouth daily.        . [DISCONTINUED] zolpidem (AMBIEN) 5 MG tablet Take 1 tablet by mouth At bedtime. As needed        No current facility-administered medications on file prior to visit.      Allergies  Allergen Reactions  . Prednisone Shortness Of Breath  . Ibuprofen Other (See Comments)  Swelling mouth and lips  . Meclizine Other (See Comments)    HALLUCINATIONS  . Sulfonamide Derivatives     REACTION: Reaction not known    ROS:  Out of a complete 14 system review of symptoms, the patient complains only of the following symptoms, and all other reviewed systems are negative.  Swelling in the legs Shortness of breath Urination problems, urinary incontinence Flushing Joint pain, achy muscles Allergies, runny nose Confusion, headache, dizziness Anxiety, insomnia, decreased energy, change in appetite, disinterest in activities  Blood pressure 135/62, pulse 107, height 5\' 3"  (1.6 m), weight 227 lb (102.967 kg).  Physical Exam  General: The patient is alert and cooperative at the time of the examination. The patient is markedly obese.  Head: Pupils are equal, round, and reactive to light. Discs are flat bilaterally.  Neck: The neck is supple, no carotid bruits are noted.  Respiratory: The respiratory examination is clear.  Cardiovascular: The cardiovascular examination reveals a regular rate and rhythm, no obvious murmurs or rubs are noted.  Skin: Extremities are with 3+ edema bilaterally below the knees, left greater than right.  Neurologic Exam  Mental status: Mini-Mental status examination done today shows a total score of 26/30.  Cranial nerves: Facial symmetry is present. There is good sensation of the face to pinprick and soft touch bilaterally. The strength of the facial muscles and the muscles to head turning and shoulder shrug are normal bilaterally. Speech is well enunciated, no aphasia or dysarthria is noted. Extraocular movements are full. Visual fields are full.  Motor: The motor testing reveals 5 over 5 strength of all 4 extremities. Good symmetric motor tone is noted  throughout.  Sensory: Sensory testing is intact to pinprick, soft touch, vibration sensation, and position sense on all 4 extremities, with the exception that there is some decrease in position sense of the left foot. No evidence of extinction is noted.  Coordination: Cerebellar testing reveals good finger-nose-finger and heel-to-shin bilaterally.  Gait and station: Gait is wide-based, unsteady. The patient uses a cane for ambulation. The patient has a limping gait on the right leg. Tandem gait was not attempted. Romberg is negative. No drift is seen.  Reflexes: Deep tendon reflexes are symmetric, but are depressed bilaterally. Toes are downgoing bilaterally.   Assessment/Plan:  1. Chronic daily headache  2. Mild memory disturbance   3. Gait disorder  The patient has a multitude of complaints including right hip pain with weightbearing, headache, tongue discomfort, and swelling in the legs. The patient has chronic daily headaches, at least some aspect of the headaches are likely tension headache. The patient will have a sedimentation rate done today, and she will be placed on low-dose gabapentin taking 100 mg 3 times daily for 2 weeks, and then go to 200 mg 3 times daily. The patient indicates that she has been getting some physical therapy for the neck and shoulders. The patient will followup in 2 or 3 months.   Marlan Palau MD 10/23/2013 7:12 PM  [facility] [address] [place], [place] [address]  Phone [phone] Fax [phone]

## 2013-10-24 ENCOUNTER — Telehealth: Payer: Self-pay | Admitting: *Deleted

## 2013-10-24 NOTE — Telephone Encounter (Signed)
Patient is afraid to take the new prescription(gabapentin), after reading the side effects of the medicine, very concerned. Is there something else she may take that is not as strong.  May call into [facility] and may leave a message or call [phone] ,[phone] to reach patient.

## 2013-10-24 NOTE — Telephone Encounter (Signed)
I called the patient. The gabapentin was given a pediatric dosing regimen. The patient is on a multitude of medications, and the gabapentin is a pharmacologically "clean" drug. This has the best chance of working with her other medications. I would recommend staying on the gabapentin, the dosing can be adjusted if side effects arise.

## 2013-11-02 ENCOUNTER — Emergency Department (HOSPITAL_COMMUNITY): Payer: Medicare Other

## 2013-11-02 ENCOUNTER — Inpatient Hospital Stay (HOSPITAL_COMMUNITY)
Admission: EM | Admit: 2013-11-02 | Discharge: 2013-11-05 | DRG: 194 | Disposition: A | Discharge status: Skilled Nursing Facility | Payer: Medicare Other | Attending: Internal Medicine | Admitting: Internal Medicine

## 2013-11-02 ENCOUNTER — Encounter (HOSPITAL_COMMUNITY): Payer: Self-pay | Admitting: Emergency Medicine

## 2013-11-02 DIAGNOSIS — J189 Pneumonia, unspecified organism: Principal | ICD-10-CM

## 2013-11-02 DIAGNOSIS — I10 Essential (primary) hypertension: Secondary | ICD-10-CM

## 2013-11-02 DIAGNOSIS — M62838 Other muscle spasm: Secondary | ICD-10-CM | POA: Diagnosis present

## 2013-11-02 DIAGNOSIS — E669 Obesity, unspecified: Secondary | ICD-10-CM | POA: Diagnosis present

## 2013-11-02 DIAGNOSIS — G9340 Encephalopathy, unspecified: Secondary | ICD-10-CM

## 2013-11-02 DIAGNOSIS — E039 Hypothyroidism, unspecified: Secondary | ICD-10-CM

## 2013-11-02 DIAGNOSIS — Z8249 Family history of ischemic heart disease and other diseases of the circulatory system: Secondary | ICD-10-CM

## 2013-11-02 DIAGNOSIS — Z7982 Long term (current) use of aspirin: Secondary | ICD-10-CM

## 2013-11-02 DIAGNOSIS — E872 Acidosis: Secondary | ICD-10-CM

## 2013-11-02 DIAGNOSIS — Z9181 History of falling: Secondary | ICD-10-CM

## 2013-11-02 DIAGNOSIS — E7800 Pure hypercholesterolemia, unspecified: Secondary | ICD-10-CM | POA: Diagnosis present

## 2013-11-02 DIAGNOSIS — M542 Cervicalgia: Secondary | ICD-10-CM | POA: Diagnosis present

## 2013-11-02 DIAGNOSIS — R627 Adult failure to thrive: Secondary | ICD-10-CM | POA: Diagnosis present

## 2013-11-02 DIAGNOSIS — Y92009 Unspecified place in unspecified non-institutional (private) residence as the place of occurrence of the external cause: Secondary | ICD-10-CM

## 2013-11-02 DIAGNOSIS — F3289 Other specified depressive episodes: Secondary | ICD-10-CM

## 2013-11-02 DIAGNOSIS — E119 Type 2 diabetes mellitus without complications: Secondary | ICD-10-CM

## 2013-11-02 DIAGNOSIS — G894 Chronic pain syndrome: Secondary | ICD-10-CM

## 2013-11-02 DIAGNOSIS — G43909 Migraine, unspecified, not intractable, without status migrainosus: Secondary | ICD-10-CM | POA: Diagnosis present

## 2013-11-02 DIAGNOSIS — G4733 Obstructive sleep apnea (adult) (pediatric): Secondary | ICD-10-CM

## 2013-11-02 DIAGNOSIS — R51 Headache: Secondary | ICD-10-CM

## 2013-11-02 DIAGNOSIS — S0003XA Contusion of scalp, initial encounter: Secondary | ICD-10-CM | POA: Diagnosis present

## 2013-11-02 DIAGNOSIS — F32A Depression, unspecified: Secondary | ICD-10-CM | POA: Diagnosis present

## 2013-11-02 DIAGNOSIS — R519 Headache, unspecified: Secondary | ICD-10-CM | POA: Diagnosis present

## 2013-11-02 DIAGNOSIS — E876 Hypokalemia: Secondary | ICD-10-CM

## 2013-11-02 DIAGNOSIS — M6282 Rhabdomyolysis: Secondary | ICD-10-CM | POA: Diagnosis present

## 2013-11-02 DIAGNOSIS — S01501A Unspecified open wound of lip, initial encounter: Secondary | ICD-10-CM | POA: Diagnosis present

## 2013-11-02 DIAGNOSIS — E785 Hyperlipidemia, unspecified: Secondary | ICD-10-CM

## 2013-11-02 DIAGNOSIS — E860 Dehydration: Secondary | ICD-10-CM | POA: Diagnosis present

## 2013-11-02 DIAGNOSIS — Z6839 Body mass index (BMI) 39.0-39.9, adult: Secondary | ICD-10-CM

## 2013-11-02 DIAGNOSIS — Z96659 Presence of unspecified artificial knee joint: Secondary | ICD-10-CM

## 2013-11-02 DIAGNOSIS — H3320 Serous retinal detachment, unspecified eye: Secondary | ICD-10-CM | POA: Diagnosis present

## 2013-11-02 DIAGNOSIS — M1990 Unspecified osteoarthritis, unspecified site: Secondary | ICD-10-CM | POA: Diagnosis present

## 2013-11-02 DIAGNOSIS — F411 Generalized anxiety disorder: Secondary | ICD-10-CM

## 2013-11-02 DIAGNOSIS — R55 Syncope and collapse: Secondary | ICD-10-CM

## 2013-11-02 DIAGNOSIS — F329 Major depressive disorder, single episode, unspecified: Secondary | ICD-10-CM | POA: Diagnosis present

## 2013-11-02 DIAGNOSIS — R5381 Other malaise: Secondary | ICD-10-CM

## 2013-11-02 DIAGNOSIS — F0391 Unspecified dementia with behavioral disturbance: Secondary | ICD-10-CM | POA: Diagnosis present

## 2013-11-02 DIAGNOSIS — W19XXXA Unspecified fall, initial encounter: Secondary | ICD-10-CM

## 2013-11-02 DIAGNOSIS — Z79899 Other long term (current) drug therapy: Secondary | ICD-10-CM

## 2013-11-02 DIAGNOSIS — R296 Repeated falls: Secondary | ICD-10-CM

## 2013-11-02 DIAGNOSIS — F03918 Unspecified dementia, unspecified severity, with other behavioral disturbance: Secondary | ICD-10-CM

## 2013-11-02 LAB — POCT I-STAT, CHEM 8
BUN: 14 mg/dL (ref 6–23)
Calcium, Ion: 1 mmol/L — ABNORMAL LOW (ref 1.13–1.30)
Chloride: 102 mEq/L (ref 96–112)
Creatinine, Ser: 0.7 mg/dL (ref 0.50–1.10)
Glucose, Bld: 164 mg/dL — ABNORMAL HIGH (ref 70–99)
HCT: 47 % — ABNORMAL HIGH (ref 36.0–46.0)
Hemoglobin: 16 g/dL — ABNORMAL HIGH (ref 12.0–15.0)
Potassium: 3.4 mEq/L — ABNORMAL LOW (ref 3.5–5.1)
TCO2: 27 mmol/L (ref 0–100)

## 2013-11-02 LAB — CBC
HCT: 44.1 % (ref 36.0–46.0)
Hemoglobin: 12.8 g/dL (ref 12.0–15.0)
MCH: 32.6 pg (ref 26.0–34.0)
MCH: 32.4 pg (ref 26.0–34.0)
MCHC: 33.6 g/dL (ref 30.0–36.0)
MCHC: 33.2 g/dL (ref 30.0–36.0)
MCV: 97.1 fL (ref 78.0–100.0)
MCV: 97.7 fL (ref 78.0–100.0)
Platelets: 240 10*3/uL (ref 150–400)
Platelets: 217 10*3/uL (ref 150–400)
RBC: 3.95 MIL/uL (ref 3.87–5.11)
RDW: 14.2 % (ref 11.5–15.5)
WBC: 12.8 10*3/uL — ABNORMAL HIGH (ref 4.0–10.5)

## 2013-11-02 LAB — URINE MICROSCOPIC-ADD ON

## 2013-11-02 LAB — URINALYSIS, ROUTINE W REFLEX MICROSCOPIC
Ketones, ur: NEGATIVE mg/dL
Leukocytes, UA: NEGATIVE
Nitrite: NEGATIVE
Specific Gravity, Urine: 1.025 (ref 1.005–1.030)
Urobilinogen, UA: 0.2 mg/dL (ref 0.0–1.0)

## 2013-11-02 LAB — POCT I-STAT TROPONIN I

## 2013-11-02 LAB — BASIC METABOLIC PANEL
BUN: 14 mg/dL (ref 6–23)
Calcium: 9.4 mg/dL (ref 8.4–10.5)
Chloride: 100 mEq/L (ref 96–112)
Creatinine, Ser: 0.56 mg/dL (ref 0.50–1.10)
GFR calc Af Amer: >90 mL/min (ref >90)
GFR calc non Af Amer: 89 mL/min — ABNORMAL LOW (ref >90)

## 2013-11-02 LAB — GLUCOSE, CAPILLARY: Glucose-Capillary: 137 mg/dL — ABNORMAL HIGH (ref 70–99)

## 2013-11-02 LAB — CK: Total CK: 1548 U/L — ABNORMAL HIGH (ref 7–177)

## 2013-11-02 LAB — CREATININE, SERUM
Creatinine, Ser: 0.61 mg/dL (ref 0.50–1.10)
GFR calc non Af Amer: 87 mL/min — ABNORMAL LOW (ref >90)

## 2013-11-02 LAB — CG4 I-STAT (LACTIC ACID): Lactic Acid, Venous: 3.74 mmol/L — ABNORMAL HIGH (ref 0.5–2.2)

## 2013-11-02 MED ORDER — ASPIRIN EC 81 MG PO TBEC
81.0000 mg | DELAYED_RELEASE_TABLET | Freq: Every day | ORAL | Status: DC
  Administered 2013-11-02 – 2013-11-05 (×4): 81 mg via ORAL
  Filled 2013-11-02 (×4): qty 1

## 2013-11-02 MED ORDER — SERTRALINE HCL 50 MG PO TABS
25.0000 mg | ORAL_TABLET | Freq: Every morning | ORAL | Status: DC
  Administered 2013-11-03 – 2013-11-05 (×3): 25 mg via ORAL
  Filled 2013-11-02 (×3): qty 0.5

## 2013-11-02 MED ORDER — SODIUM CHLORIDE 0.9 % IV BOLUS (SEPSIS)
1000.0000 mL | Freq: Once | INTRAVENOUS | Status: AC
  Administered 2013-11-02: 1000 mL via INTRAVENOUS

## 2013-11-02 MED ORDER — TOBRAMYCIN-DEXAMETHASONE 0.3-0.1 % OP SUSP
2.0000 [drp] | Freq: Four times a day (QID) | OPHTHALMIC | Status: DC
  Administered 2013-11-02 – 2013-11-05 (×10): 2 [drp] via OPHTHALMIC
  Filled 2013-11-02: qty 2.5

## 2013-11-02 MED ORDER — MEMANTINE HCL 10 MG PO TABS
10.0000 mg | ORAL_TABLET | Freq: Two times a day (BID) | ORAL | Status: DC
  Administered 2013-11-02 – 2013-11-05 (×6): 10 mg via ORAL
  Filled 2013-11-02 (×7): qty 1

## 2013-11-02 MED ORDER — DEXTROSE 5 % IV SOLN
500.0000 mg | Freq: Every day | INTRAVENOUS | Status: DC
  Administered 2013-11-02 – 2013-11-03 (×2): 500 mg via INTRAVENOUS
  Filled 2013-11-02 (×3): qty 500

## 2013-11-02 MED ORDER — DEXTROSE 5 % IV SOLN: 500.0000 mg | Freq: Once | INTRAVENOUS | Status: DC

## 2013-11-02 MED ORDER — METHOCARBAMOL 500 MG PO TABS
500.0000 mg | ORAL_TABLET | Freq: Two times a day (BID) | ORAL | Status: DC
  Administered 2013-11-03 – 2013-11-05 (×6): 500 mg via ORAL
  Filled 2013-11-02 (×7): qty 1

## 2013-11-02 MED ORDER — DEXTROSE 5 % IV SOLN: 1.0000 g | Freq: Once | INTRAVENOUS | Status: DC

## 2013-11-02 MED ORDER — BUTALBITAL-APAP-CAFFEINE 50-325-40 MG PO TABS
50.0000 | ORAL_TABLET | Freq: Four times a day (QID) | ORAL | Status: DC | PRN
Start: 2013-11-02 — End: 2013-11-05
  Administered 2013-11-03 (×2): 1 via ORAL
  Administered 2013-11-05: 50 via ORAL
  Filled 2013-11-02 (×3): qty 1

## 2013-11-02 MED ORDER — TRAMADOL HCL 50 MG PO TABS: 50.0000 mg | ORAL_TABLET | Freq: Four times a day (QID) | ORAL | Status: DC | PRN

## 2013-11-02 MED ORDER — HYDROCHLOROTHIAZIDE 12.5 MG PO CAPS
12.5000 mg | ORAL_CAPSULE | Freq: Every day | ORAL | Status: DC
  Filled 2013-11-02: qty 1

## 2013-11-02 MED ORDER — FUROSEMIDE 40 MG PO TABS
40.0000 mg | ORAL_TABLET | Freq: Every day | ORAL | Status: DC
Start: 2013-11-03 — End: 2013-11-03
  Filled 2013-11-02: qty 1

## 2013-11-02 MED ORDER — DEXTROSE 5 % IV SOLN
1.0000 g | Freq: Every day | INTRAVENOUS | Status: DC
  Administered 2013-11-02 – 2013-11-04 (×3): 1 g via INTRAVENOUS
  Filled 2013-11-02 (×4): qty 10

## 2013-11-02 MED ORDER — LOSARTAN POTASSIUM-HCTZ 100-25 MG PO TABS: 0.5000 | ORAL_TABLET | Freq: Every morning | ORAL | Status: DC

## 2013-11-02 MED ORDER — SODIUM CHLORIDE 0.9 % IV SOLN
INTRAVENOUS | Status: DC
  Administered 2013-11-02: 22:00:00 via INTRAVENOUS

## 2013-11-02 MED ORDER — GABAPENTIN 100 MG PO CAPS
100.0000 mg | ORAL_CAPSULE | Freq: Three times a day (TID) | ORAL | Status: DC
  Administered 2013-11-02: 22:00:00 100 mg via ORAL
  Filled 2013-11-02 (×4): qty 1

## 2013-11-02 MED ORDER — INSULIN ASPART 100 UNIT/ML ~~LOC~~ SOLN
0.0000 [IU] | SUBCUTANEOUS | Status: DC
  Administered 2013-11-03: 5 [IU] via SUBCUTANEOUS

## 2013-11-02 MED ORDER — ENOXAPARIN SODIUM 40 MG/0.4ML ~~LOC~~ SOLN
40.0000 mg | SUBCUTANEOUS | Status: DC
  Administered 2013-11-02 – 2013-11-04 (×3): 40 mg via SUBCUTANEOUS
  Filled 2013-11-02 (×4): qty 0.4

## 2013-11-02 MED ORDER — LOSARTAN POTASSIUM 50 MG PO TABS
50.0000 mg | ORAL_TABLET | Freq: Every day | ORAL | Status: DC
  Administered 2013-11-03 – 2013-11-05 (×3): 50 mg via ORAL
  Filled 2013-11-02 (×3): qty 1

## 2013-11-02 MED ORDER — SODIUM CHLORIDE 0.9 % IV BOLUS (SEPSIS)
1000.0000 mL | Freq: Once | INTRAVENOUS | Status: AC
  Administered 2013-11-02: 22:00:00 1000 mL via INTRAVENOUS

## 2013-11-02 MED ORDER — ATORVASTATIN CALCIUM 20 MG PO TABS
20.0000 mg | ORAL_TABLET | Freq: Every day | ORAL | Status: DC
  Administered 2013-11-03 – 2013-11-04 (×2): 20 mg via ORAL
  Filled 2013-11-02 (×3): qty 1

## 2013-11-02 NOTE — Progress Notes (Signed)
Pt has refused CPAP for the night.  Pt states that she is supposed to wear one at home but doesn't because she can not tolerate it.  RT to monitor and assess as needed.

## 2013-11-02 NOTE — ED Notes (Signed)
Pt. Is due for blood culture, [facility] Charge Nurse is aware.

## 2013-11-02 NOTE — H&P (Signed)
[facility] History and Physical  Adrienne [name] RUE:[id number] DOB: 1935-12-14 DOA: 11/02/2013  Referring physician:  PCP: [name], MD  Specialists:   Chief Complaint: Fall in home found by neighbor and daughter   HPI: Adrienne [name] is a 77 y.o. WF PMHx Anxiety, Depression, Hypothyroidism, HTN, HLD, OSA Presented to ED after being found down. She states she fell between her bed and the wall and was picked up by the EMS 2 days later after she was found by her kids. Patient cannot recall the events the left her being on the floor. She does have a history of dementia. She was found in her urine and feces by EMS. Here she is hemodynamically stable. She was placed in a c-collar for spine precautions and because she had neck pain on exam. Her lungs are clear. Her belly is distended but nontender. She has a deformity to her right hip area which is tender. Currently patient in c-collar patient unsure of how long she was down when found by neighbor however daughter believes it had been 1-2 days this because newspaper had not been picked up. States has fallen 2-3 times over the past 18 month usually when bending over. States becomes lightheaded and will fall. States is also been having migraine headaches which started approximately a month ago. States headache starts top of the head and radiates around entire head and face. Negative photophobia/photophobia, plus nausea. States has seen neurologist Dr. Lesia Sago, who started her on Fioricet and Neurontin. Patient states feels it does help headaches but does not totally resolve them. Patient and daughter unsure of how much/how often patient is using her fever set and Neurontin. Daughter afraid these medications playing into patient's frequent falls.   Review of Systems: The patient denies anorexia, fever, weight loss,, vision loss, decreased hearing, hoarseness, chest pain, dyspnea on exertion, peripheral edema, hemoptysis, abdominal  pain, melena, hematochezia, severe indigestion/heartburn, hematuria, incontinence, genital sores, muscle weakness, suspicious skin lesions, transient blindness, abnormal bleeding, enlarged lymph nodes, angioedema, and breast masses.    TRAVEL HISTORY:   Procedure Head CT/C-spine CT 11/02/2013 Head CT: No acute intracranial abnormalities. Left temporal regions  scalp hematoma. No skull fracture.  Cervical CT: No fracture or acute findingThere are degenerative changes  with mild loss of disc height and small endplate osteophytes  CXR 11/02/2013 1. Slight increase in patchy lingular atelectasis or infiltrate.  2. Stable mild cardiomegaly.  Right hip x-ray 11/02/2013 Mild degenerative changes in the hips bilaterally. No fracture,  subluxation or dislocation. Advanced degenerative changes in the  visualized lower lumbar spine.    Antibiotics Ceftriaxone 12/19>> Azithromycin 12/19>>    Past Medical History  Diagnosis Date  . Hypertension   . Hypercholesterolemia   . Obesity   . Normal nuclear stress test Jan 2012    No ischemia. EF 81%  . Sleep apnea     DOES NOT USE CPAP-UNABLE TO TOLERATE  . Anxiety   . Depression   . Diabetes mellitus     ORAL MED  . Osteopenia   . Shortness of breath     WITH EXERTION  . Hypothyroidism     STATES SHE NO LONGER NEEDS THYROID SUPPLEMENT  . Osteoarthritis (arthritis due to wear and tear of joints)     PAIN AND OA BOTH KNEES  . Urinary frequency   . Frequency of urination   . Nocturia   . Headache(784.0) 10/23/2013   Past Surgical History  Procedure Laterality Date  . Vesicovaginal fistula closure  w/ tah    . Thyroidectomy, partial    . Cardiac catheterization  02/03/1999    EF 70%  . US echocardiography  08/21/2004    EF 60-65%  . Cardiovascular stress test  11/30/2010    EF 81%, NORMAL  . Bilateral oophorectomy    . Abdominal hysterectomy    . Total knee arthroplasty  04/21/2012    Procedure: TOTAL KNEE ARTHROPLASTY;  Surgeon:  Loanne Drilling, MD;  Location: [facility];  Service: Orthopedics;  Laterality: Right;  . Orif ankle fracture  2004  . Total knee arthroplasty Left 01/12/2013    Procedure: TOTAL KNEE ARTHROPLASTY;  Surgeon: Loanne Drilling, MD;  Location: [facility];  Service: Orthopedics;  Laterality: Left;   Social History:  reports that she has never smoked. She has never used smokeless tobacco. She reports that she does not drink alcohol or use illicit drugs.  where does patient live--home, ALF, SNF? and with whom if at home?  Can patient participate in ADLs?  Allergies  Allergen Reactions  . Prednisone Shortness Of Breath  . Ibuprofen Other (See Comments)    Swelling mouth and lips  . Meclizine Other (See Comments)    HALLUCINATIONS  . Sulfonamide Derivatives     REACTION: Reaction not known    Family History  Problem Relation Age of Onset  . Heart disease Father   . Hypertension Father   . Heart attack Father   . Colon cancer Mother   . Heart attack Mother   . Hypertension Mother   . Breast cancer Sister      Prior to Admission medications   Medication Sig Start Date End Date Taking? Authorizing Provider  acetaminophen (TYLENOL) 500 MG tablet Take 500 mg by mouth every 6 (six) hours as needed for pain.   Yes Historical Provider, MD  aspirin EC 81 MG tablet Take 81 mg by mouth daily.   Yes Historical Provider, MD  atorvastatin (LIPITOR) 20 MG tablet Take 20 mg by mouth daily before supper.    Yes Historical Provider, MD  butalbital-acetaminophen-caffeine (FIORICET, ESGIC) 50-325-40 MG per tablet Take 50 tablets by mouth. 10/15/13  Yes Historical Provider, MD  Calcium Carbonate (CALTRATE 600 PO) Take 1 tablet by mouth 2 (two) times daily.   Yes Historical Provider, MD  Cholecalciferol (VITAMIN D PO) Take 2 tablets by mouth daily.    Yes Historical Provider, MD  DYMISTA 137-50 MCG/ACT SUSP Place 137 mcg into alternate nostrils 2 (two) times daily.  10/15/13  Yes Historical Provider, MD  furosemide  (LASIX) 40 MG tablet Take 40 mg by mouth daily. 10/09/13  Yes Historical Provider, MD  gabapentin (NEURONTIN) 100 MG capsule Take 1 capsule (100 mg total) by mouth 3 (three) times daily. 10/23/13  Yes York Spaniel, MD  losartan-hydrochlorothiazide (HYZAAR) 100-25 MG per tablet Take 0.5 tablets by mouth every morning.    Yes Historical Provider, MD  memantine (NAMENDA) 10 MG tablet Take 10 mg by mouth 2 (two) times daily.   Yes Historical Provider, MD  metFORMIN (GLUCOPHAGE) 500 MG tablet Take 500 mg by mouth 2 (two) times daily.    Yes Historical Provider, MD  mirtazapine (REMERON) 15 MG tablet Take 7.5 mg by mouth at bedtime. 1/2 TABLET AT BEDTIME   Yes Historical Provider, MD  Multiple Vitamin (MULTIVITAMIN WITH MINERALS) TABS tablet Take 1 tablet by mouth daily.   Yes Historical Provider, MD  sertraline (ZOLOFT) 50 MG tablet Take 50 mg by mouth every morning. TAKE 1/2 OF  50 MG TABLET 04/25/11  Yes Historical Provider, MD  Solifenacin Succinate (VESICARE PO) Take 1 tablet by mouth daily.   Yes Historical Provider, MD  traMADol (ULTRAM) 50 MG tablet Take 50-100 mg by mouth every 6 (six) hours as needed for pain.     Historical Provider, MD   Physical Exam: Filed Vitals:   11/02/13 1614 11/02/13 1819  BP: 144/61 140/57  Pulse: 100 97  Temp: 98 F (36.7 C) 98.9 F (37.2 C)  TempSrc: Oral Oral  Resp: 20 18  SpO2: 97% 97%     General:  A./O. x4, patient unsure of exact amount of time she was unconscious on the floor but does remember her neighbor and daughter finding her in contacting the EMS, still mildly confused (near baseline per daughter)  Eyes: Bilateral bloodshot eyes Lt > Rt, lesions of left eye appears to be possibly detached, left eye pain with light  Neck: Negative JVD, negative lymphadenopathy  Cardiovascular: Regular rhythm and rate, negative murmurs rubs or gallops, DP/PT pulse +1  Respiratory: Clear to auscultation bilateral  Abdomen: Obese, soft, nontender, plus  bowel sounds  Skin: Patient has multiple lacerations around the lips and left-sided face. Left-sided hematoma just above the left eye, laceration on her lower lip consistent with biting through secondary to fall  Musculoskeletal: Bilateral pedal edema 1-2+, bilateral well-healed vertical incisions over knee consistent with TKA   Psychiatric: Patient A./O. x4, however somewhat confused at times mild delirium?  vs postconcussive syndrome   Neurologic: Current 2 through 12 intact, tongue/school midline, able to move all extremities to command, sensation intact throughout. Did not complete full neurologic exam secondary to pain from fall  Labs on Admission:  Basic Metabolic Panel:  Recent Labs Lab 11/02/13 1700 11/02/13 1716  NA 142 142  K 3.5 3.4*  CL 100 102  CO2 25  --   GLUCOSE 165* 164*  BUN 14 14  CREATININE 0.56 0.70  CALCIUM 9.4  --    Liver Function Tests: No results found for this basename: AST, ALT, ALKPHOS, BILITOT, PROT, ALBUMIN,  in the last 168 hours No results found for this basename: LIPASE, AMYLASE,  in the last 168 hours No results found for this basename: AMMONIA,  in the last 168 hours CBC:  Recent Labs Lab 11/02/13 1700 11/02/13 1716  WBC 12.8*  --   HGB 14.8 16.0*  HCT 44.1 47.0*  MCV 97.1  --   PLT 240  --    Cardiac Enzymes:  Recent Labs Lab 11/02/13 1700  CKTOTAL 1548*    BNP (last 3 results)  Recent Labs  10/06/13 2210  PROBNP 532.5*   CBG:  Recent Labs Lab 11/02/13 1652  GLUCAP 154*    Radiological Exams on Admission: Dg Chest 2 View  11/02/2013   CLINICAL DATA:  Cough, congestion.  EXAM: CHEST - 2 VIEW  COMPARISON:  10/06/2013  FINDINGS: Atheromatous aorta. Spurring in the lower thoracic spine. Mild cardiomegaly stable. Some increase in patchy lingular atelectasis or early infiltrate. Left lung clear. No effusion.  IMPRESSION: 1. Slight increase in patchy lingular atelectasis or infiltrate. 2.  Stable mild cardiomegaly.    Electronically Signed   By: Oley Balm M.D.   On: 11/02/2013 18:04   Dg Hip Complete Right  11/02/2013   CLINICAL DATA:  Fall, right hip pain.  EXAM: RIGHT HIP - COMPLETE 2+ VIEW  COMPARISON:  None.  FINDINGS: Mild degenerative changes in the hips bilaterally. No fracture, subluxation or dislocation. Advanced degenerative changes  in the visualized lower lumbar spine.  IMPRESSION: No acute bony abnormality.   Electronically Signed   By: Charlett Nose M.D.   On: 11/02/2013 18:04   Ct Head Wo Contrast  11/02/2013   CLINICAL DATA:  Fall.  EXAM: CT HEAD WITHOUT CONTRAST  CT CERVICAL SPINE WITHOUT CONTRAST  TECHNIQUE: Multidetector CT imaging of the head and cervical spine was performed following the standard protocol without intravenous contrast. Multiplanar CT image reconstructions of the cervical spine were also generated.  COMPARISON:  10/06/2013  FINDINGS: CT HEAD FINDINGS  Ventricles are normal in configuration. There is ventricular and sulcal enlargement reflecting moderate atrophy. This is stable. There is also stable periventricular white matter hypoattenuation most consistent with chronic microvascular ischemic change. No parenchymal masses or mass effect. No evidence of a cortical infarct.  There are no extra-axial masses. Widened CSF space anterior to left temporal lobe may reflect an arachnoid cyst. It is stable.  There is a left temporal region scalp hematoma that extends to the left lateral periorbital region. No underlying skull fracture.  Minimal dependent fluid in the left maxillary sinus. Remaining sinuses are clear as are the mastoid air cells.  No intracranial hemorrhage.  CT CERVICAL SPINE FINDINGS  No fracture or spondylolisthesis. There are degenerative changes with mild loss of disc height and small endplate osteophytes. There are more prominent facet degenerative changes. These findings are stable. The bones are also demineralized. No evidence of a disc herniation.  Soft tissues show  heterogeneous enlargement of the left thyroid gland with calcifications. This is stable. There are stable vascular calcifications.  The lung apices are clear.  IMPRESSION: Head CT: No acute intracranial abnormalities. Left temporal regions scalp hematoma. No skull fracture.  Cervical CT:  No fracture or acute finding.   Electronically Signed   By: Amie Portland M.D.   On: 11/02/2013 17:53   Ct Cervical Spine Wo Contrast  11/02/2013   CLINICAL DATA:  Fall.  EXAM: CT HEAD WITHOUT CONTRAST  CT CERVICAL SPINE WITHOUT CONTRAST  TECHNIQUE: Multidetector CT imaging of the head and cervical spine was performed following the standard protocol without intravenous contrast. Multiplanar CT image reconstructions of the cervical spine were also generated.  COMPARISON:  10/06/2013  FINDINGS: CT HEAD FINDINGS  Ventricles are normal in configuration. There is ventricular and sulcal enlargement reflecting moderate atrophy. This is stable. There is also stable periventricular white matter hypoattenuation most consistent with chronic microvascular ischemic change. No parenchymal masses or mass effect. No evidence of a cortical infarct.  There are no extra-axial masses. Widened CSF space anterior to left temporal lobe may reflect an arachnoid cyst. It is stable.  There is a left temporal region scalp hematoma that extends to the left lateral periorbital region. No underlying skull fracture.  Minimal dependent fluid in the left maxillary sinus. Remaining sinuses are clear as are the mastoid air cells.  No intracranial hemorrhage.  CT CERVICAL SPINE FINDINGS  No fracture or spondylolisthesis. There are degenerative changes with mild loss of disc height and small endplate osteophytes. There are more prominent facet degenerative changes. These findings are stable. The bones are also demineralized. No evidence of a disc herniation.  Soft tissues show heterogeneous enlargement of the left thyroid gland with calcifications. This is stable.  There are stable vascular calcifications.  The lung apices are clear.  IMPRESSION: Head CT: No acute intracranial abnormalities. Left temporal regions scalp hematoma. No skull fracture.  Cervical CT:  No fracture or acute finding.   Electronically  Signed   By: Amie Portland M.D.   On: 11/02/2013 17:53    EKG: NSR, borderline criteria for [facility]   Assessment/Plan Active Problems:   * No active hospital problems. *  Pneumonia -Continue ceftriaxone + Zithromax   OSA -Respiratory to bedside to administer CPAP  Rhabdomyolysis -Mild case was likely secondary to patient being on on the floor 24-48 hour , CK = 1548  -Bolus 1 L normal saline, then continue normal saline at 146ml/hr -Check CK q. 6 hours  Syncope/frequent falls -Obtain MRI brain -ProBNP= 532.5 -Obtain echocardiogram -Would consider cardiology consult in the a.m. to arrange nuclear stress test and Holter monitor.  Painful left eye (retinal detachment) -Spoke with Dr. Luciana Axe (ophthalmologist) and described exam patient's left eye. Will start TobraDex drops QID per his recommendation. Dr. Luciana Axe stated that if any worsening in the a.m. contact him for further recommendations  HTN - Continue Lasix, HCTZ, Cozaar -Considering patient's further falls would not attend to lower blood pressure into the AHA guidelines allowed to run slightly high until a clear reason for fall/syncopal events are determined  HLD - continue home regimen  Diabetes type 2  -Will hold metformin while patient hospitalized -Treat with moderate SSI  Hypothyroidism -Obtain TSH/free T4 to ensure not playing into patient's falls  Depression -Continue home meds  Anxiety -Continue home meds  Dementia -Continue Namenda  -In the a.m. contact neurology for a full cognitive workup for dementia  Chronic Pain -Continue gabapentin (home dose), and tramadol -Patient with significant muscle spasms in cervical paraspinal and bilateral trapezius Rt >Lt, start  Robaxin and hot/cold packs -Consult PT/OT for range of motion, strengthening while in the hospital and evaluation for SNF vs H./H.     Code Status: Full Family Communication: Daughter present for discussion of plan of care Disposition Plan: At completion of syncope workup  Time spent: 90 minutes  Drema Dallas [facility] Pager [phone]  If 7PM-7AM, please contact night-coverage [email or web address] Password [facility] 11/02/2013, 6:39 PM

## 2013-11-02 NOTE — ED Notes (Signed)
Pt has skin tear to L hip. Also 2 bruises to L back and peteciae on L breast

## 2013-11-02 NOTE — ED Notes (Signed)
Hospitalist at bedside. Will transport pt. As soon as consult done.

## 2013-11-02 NOTE — ED Notes (Addendum)
Pt from home and was found on floor by daughter. Pt was between dresser and bed. Pt has some skin break down to side. Unknown if pt hit head, pt is confused . cbg 147

## 2013-11-02 NOTE — ED Provider Notes (Signed)
CSN: [id number]     Arrival date & time 11/02/13  1559 History   First MD Initiated Contact with Patient 11/02/13 1604     Chief Complaint  Patient presents with  . Fall   (Consider location/radiation/quality/duration/timing/severity/associated sxs/prior Treatment) Patient is a 77 y.o. female presenting with fall. The history is provided by the patient.  Fall This is a new problem. The current episode started 2 days ago. The problem occurs constantly. The problem has been resolved. Associated symptoms include headaches (chronic headaches, seen 10 days ago by Neuro for daily headaches). Pertinent negatives include no chest pain, no abdominal pain and no shortness of breath. Nothing aggravates the symptoms. Nothing relieves the symptoms.    Past Medical History  Diagnosis Date  . Hypertension   . Hypercholesterolemia   . Obesity   . Normal nuclear stress test Jan 2012    No ischemia. EF 81%  . Sleep apnea     DOES NOT USE CPAP-UNABLE TO TOLERATE  . Anxiety   . Depression   . Diabetes mellitus     ORAL MED  . Osteopenia   . Shortness of breath     WITH EXERTION  . Hypothyroidism     STATES SHE NO LONGER NEEDS THYROID SUPPLEMENT  . Osteoarthritis (arthritis due to wear and tear of joints)     PAIN AND OA BOTH KNEES  . Urinary frequency   . Frequency of urination   . Nocturia   . Headache(784.0) 10/23/2013   Past Surgical History  Procedure Laterality Date  . Vesicovaginal fistula closure w/ tah    . Thyroidectomy, partial    . Cardiac catheterization  02/03/1999    EF 70%  . US echocardiography  08/21/2004    EF 60-65%  . Cardiovascular stress test  11/30/2010    EF 81%, NORMAL  . Bilateral oophorectomy    . Abdominal hysterectomy    . Total knee arthroplasty  04/21/2012    Procedure: TOTAL KNEE ARTHROPLASTY;  Surgeon: Loanne Drilling, MD;  Location: [facility];  Service: Orthopedics;  Laterality: Right;  . Orif ankle fracture  2004  . Total knee arthroplasty Left 01/12/2013    Procedure: TOTAL KNEE ARTHROPLASTY;  Surgeon: Loanne Drilling, MD;  Location: [facility];  Service: Orthopedics;  Laterality: Left;   Family History  Problem Relation Age of Onset  . Heart disease Father   . Hypertension Father   . Heart attack Father   . Colon cancer Mother   . Heart attack Mother   . Hypertension Mother   . Breast cancer Sister    History  Substance Use Topics  . Smoking status: Never Smoker   . Smokeless tobacco: Never Used  . Alcohol Use: No   OB History   Grav Para Term Preterm Abortions TAB SAB Ect Mult Living                 Review of Systems  Unable to perform ROS: Dementia  Constitutional: Negative for fever.  Respiratory: Negative for cough and shortness of breath.   Cardiovascular: Negative for chest pain.  Gastrointestinal: Negative for abdominal pain.  Neurological: Positive for headaches (chronic headaches, seen 10 days ago by Neuro for daily headaches).    Allergies  Prednisone; Ibuprofen; Meclizine; and Sulfonamide derivatives  Home Medications   Current Outpatient Rx  Name  Route  Sig  Dispense  Refill  . acetaminophen (TYLENOL) 325 MG tablet   Oral   Take 650 mg by mouth every 6 (  six) hours as needed (pain).         Marland Kitchen acetaminophen (TYLENOL) 500 MG tablet   Oral   Take 500 mg by mouth every 6 (six) hours as needed for pain.         Marland Kitchen aspirin EC 81 MG tablet   Oral   Take 81 mg by mouth daily.         Marland Kitchen atorvastatin (LIPITOR) 20 MG tablet   Oral   Take 20 mg by mouth daily before supper.          . butalbital-acetaminophen-caffeine (FIORICET, ESGIC) 50-325-40 MG per tablet   Oral   Take 50 tablets by mouth.         . Calcium Carbonate (CALTRATE 600 PO)   Oral   Take 1 tablet by mouth 2 (two) times daily.         . cetirizine-pseudoephedrine (ZYRTEC-D) 5-120 MG per tablet   Oral   Take 1 tablet by mouth 2 (two) times daily as needed for allergies.   20 tablet   0   . Cholecalciferol (VITAMIN D PO)   Oral    Take 1 tablet by mouth daily.         Marland Kitchen DYMISTA 137-50 MCG/ACT SUSP   Alternating Nares   Place 137 mcg into alternate nostrils.         . Fesoterodine Fumarate (TOVIAZ) 8 MG TB24   Oral   Take 8 mg by mouth daily before supper.          . fluticasone (FLONASE) 50 MCG/ACT nasal spray   Each Nare   Place 2 sprays into both nostrils daily.   16 g   0   . furosemide (LASIX) 40 MG tablet   Oral   Take 40 mg by mouth daily.         Marland Kitchen gabapentin (NEURONTIN) 100 MG capsule   Oral   Take 1 capsule (100 mg total) by mouth 3 (three) times daily.   180 capsule   3   . losartan-hydrochlorothiazide (HYZAAR) 100-25 MG per tablet   Oral   Take 0.5 tablets by mouth every morning.          . meclizine (ANTIVERT) 12.5 MG tablet   Oral   Take 1 tablet (12.5 mg total) by mouth 3 (three) times daily as needed for dizziness.   30 tablet   0   . memantine (NAMENDA) 10 MG tablet   Oral   Take 10 mg by mouth 2 (two) times daily.         . metFORMIN (GLUCOPHAGE) 500 MG tablet   Oral   Take 500 mg by mouth 2 (two) times daily.          . mirtazapine (REMERON) 15 MG tablet   Oral   Take 15 mg by mouth at bedtime. 1/2 TABLET AT BEDTIME         . Multiple Vitamin (MULTIVITAMIN WITH MINERALS) TABS tablet   Oral   Take 1 tablet by mouth daily.         . ondansetron (ZOFRAN) 4 MG tablet   Oral   Take 4 mg by mouth daily.         Marland Kitchen oxyCODONE-acetaminophen (PERCOCET/ROXICET) 5-325 MG per tablet   Oral   Take 1 tablet by mouth every 6 (six) hours as needed for pain.   12 tablet   0   . oxymetazoline (AFRIN NASAL SPRAY) 0.05 % nasal spray   Each  Nare   Place 1 spray into both nostrils 2 (two) times daily.         . sertraline (ZOLOFT) 50 MG tablet   Oral   Take 50 mg by mouth every morning. TAKE 1/2 OF 50 MG TABLET         . traMADol (ULTRAM) 50 MG tablet   Oral   Take 50-100 mg by mouth every 6 (six) hours as needed for pain.          Marland Kitchen triamcinolone  (KENALOG) 0.1 % paste   Mouth/Throat   Use as directed 0.1 g in the mouth or throat daily as needed.         . triamcinolone (NASACORT AQ) 55 MCG/ACT AERO nasal inhaler   Nasal   Place 1-2 sprays into the nose daily.          BP 144/61  Pulse 100  Temp(Src) 98 F (36.7 C) (Oral)  Resp 20  SpO2 97% Physical Exam  Nursing note and vitals reviewed. Constitutional: She is oriented to person, place, and time. She appears well-developed and well-nourished. No distress.  HENT:  Head: Normocephalic and atraumatic.  Eyes: EOM are normal. Pupils are equal, round, and reactive to light.  Neck: Normal range of motion. Neck supple.  Cardiovascular: Normal rate and regular rhythm.  Exam reveals no friction rub.   No murmur heard. Pulmonary/Chest: Effort normal and breath sounds normal. No respiratory distress. She has no wheezes. She has no rales.  Abdominal: Soft. She exhibits no distension. There is no tenderness. There is no rebound.  Musculoskeletal: She exhibits no edema.       Right hip: She exhibits decreased range of motion, bony tenderness (lateral) and deformity.  Neurological: She is alert and oriented to person, place, and time. She exhibits normal muscle tone. Coordination normal.  Skin: No rash noted. She is not diaphoretic.    ED Course  Procedures (including critical care time) Labs Review Labs Reviewed  CBC  BASIC METABOLIC PANEL  CK  URINALYSIS, ROUTINE W REFLEX MICROSCOPIC   Imaging Review No results found.  EKG Interpretation    Date/Time:  Friday November 02 2013 16:33:39 EST Ventricular Rate:  99 PR Interval:  136 QRS Duration: 100 QT Interval:  384 QTC Calculation: 493 R Axis:   12 Text Interpretation:  Sinus rhythm Probable left atrial enlargement Abnormal inferior Q waves Minimal ST depression, lateral leads Borderline prolonged QT interval Confirmed by Gwendolyn Grant  MD, Jakell Trusty ([id number]) on 11/02/2013 4:50:50 PM            MDM   1. CAP (community  acquired pneumonia)   2. Lactic acidosis   3. Fall, initial encounter    She is a 77 year old female who presents after being found down. She states she fell between her bed and the wall and was picked up by the EMS 2 days later after she was found by her kids. Patient cannot recall the events the left her being on the floor. She does have a history of dementia. She was found in her urine and feces by EMS. Here she is hemodynamically stable. She was placed in a c-collar for spine precautions and because she had neck pain on exam. Her lungs are clear. Her belly is distended but nontender. She has a deformity to her right hip area which is tender. Concern for multiple things including but not limited to urinary tract infection, possible rhabdomyolysis, right hip fracture. Will do broad-based workup with labs and imaging Labs  show elevated lactate, no hip fracture. No UTI. CXR c/w L sided pneumonia, CXR reviewed by me. Cultures drawn, antibiotics ordered. Admitted.   I have reviewed all labs and imaging and considered them in my medical decision making.   Dagmar Hait, MD 11/02/13 [phone]

## 2013-11-02 NOTE — ED Notes (Signed)
ED staff unable to draw blood cultures. Floor charge RN informed

## 2013-11-03 ENCOUNTER — Encounter (HOSPITAL_COMMUNITY): Payer: Self-pay

## 2013-11-03 ENCOUNTER — Inpatient Hospital Stay (HOSPITAL_COMMUNITY): Payer: Medicare Other

## 2013-11-03 DIAGNOSIS — W19XXXA Unspecified fall, initial encounter: Secondary | ICD-10-CM

## 2013-11-03 DIAGNOSIS — I517 Cardiomegaly: Secondary | ICD-10-CM

## 2013-11-03 LAB — GLUCOSE, CAPILLARY
Glucose-Capillary: 105 mg/dL — ABNORMAL HIGH (ref 70–99)
Glucose-Capillary: 110 mg/dL — ABNORMAL HIGH (ref 70–99)
Glucose-Capillary: 119 mg/dL — ABNORMAL HIGH (ref 70–99)
Glucose-Capillary: 233 mg/dL — ABNORMAL HIGH (ref 70–99)

## 2013-11-03 LAB — TSH: TSH: 1.176 u[IU]/mL (ref 0.350–4.500)

## 2013-11-03 LAB — CBC WITH DIFFERENTIAL/PLATELET
Basophils Absolute: 0 10*3/uL (ref 0.0–0.1)
Basophils Relative: 0 % (ref 0–1)
Eosinophils Relative: 1 % (ref 0–5)
HCT: 34.8 % — ABNORMAL LOW (ref 36.0–46.0)
MCHC: 33 g/dL (ref 30.0–36.0)
MCV: 98.3 fL (ref 78.0–100.0)
Monocytes Absolute: 0.4 10*3/uL (ref 0.1–1.0)
Platelets: 230 10*3/uL (ref 150–400)
RDW: 14.8 % (ref 11.5–15.5)

## 2013-11-03 LAB — COMPREHENSIVE METABOLIC PANEL
ALT: 42 U/L — ABNORMAL HIGH (ref 0–35)
Alkaline Phosphatase: 35 U/L — ABNORMAL LOW (ref 39–117)
BUN: 13 mg/dL (ref 6–23)
CO2: 26 mEq/L (ref 19–32)
Calcium: 8 mg/dL — ABNORMAL LOW (ref 8.4–10.5)
GFR calc Af Amer: >90 mL/min (ref >90)
GFR calc non Af Amer: 85 mL/min — ABNORMAL LOW (ref >90)
Glucose, Bld: 123 mg/dL — ABNORMAL HIGH (ref 70–99)
Potassium: 2.9 mEq/L — ABNORMAL LOW (ref 3.5–5.1)
Sodium: 141 mEq/L (ref 135–145)

## 2013-11-03 LAB — HEMOGLOBIN A1C
Hgb A1c MFr Bld: 6.1 % — ABNORMAL HIGH (ref <5.7)
Mean Plasma Glucose: 128 mg/dL — ABNORMAL HIGH (ref <117)

## 2013-11-03 LAB — LIPID PANEL
LDL Cholesterol: 74 mg/dL (ref 0–99)
Total CHOL/HDL Ratio: 4.5 RATIO
VLDL: 43 mg/dL — ABNORMAL HIGH (ref 0–40)

## 2013-11-03 LAB — CK
Total CK: 946 U/L — ABNORMAL HIGH (ref 7–177)
Total CK: 590 U/L — ABNORMAL HIGH (ref 7–177)

## 2013-11-03 LAB — T4, FREE: Free T4: 1.14 ng/dL (ref 0.80–1.80)

## 2013-11-03 LAB — MAGNESIUM: Magnesium: 1.5 mg/dL (ref 1.5–2.5)

## 2013-11-03 MED ORDER — POTASSIUM CHLORIDE CRYS ER 20 MEQ PO TBCR
40.0000 meq | EXTENDED_RELEASE_TABLET | Freq: Four times a day (QID) | ORAL | Status: AC
  Administered 2013-11-03 (×2): 40 meq via ORAL
  Filled 2013-11-03 (×2): qty 2

## 2013-11-03 MED ORDER — SODIUM CHLORIDE 0.9 % IV SOLN
INTRAVENOUS | Status: DC
  Administered 2013-11-03 – 2013-11-04 (×4): via INTRAVENOUS

## 2013-11-03 MED ORDER — INSULIN ASPART 100 UNIT/ML ~~LOC~~ SOLN
0.0000 [IU] | Freq: Three times a day (TID) | SUBCUTANEOUS | Status: DC
  Administered 2013-11-05: 12:00:00 2 [IU] via SUBCUTANEOUS

## 2013-11-03 MED ORDER — INSULIN ASPART 100 UNIT/ML ~~LOC~~ SOLN: 0.0000 [IU] | Freq: Every day | SUBCUTANEOUS | Status: DC

## 2013-11-03 NOTE — Progress Notes (Signed)
Patient continues to refuse nocturnal CPAP tonight. When asked if she has ever used a CPAP at home, she states "yeah, but I don't like it and I'm just being stubborn". Education provided. However, she continues to decline and states the feel of a CPAP "drives me crazy". She is aware that RT is available and encouraged to call if she should change her mind and require assistance.

## 2013-11-03 NOTE — Progress Notes (Signed)
  Echocardiogram 2D Echocardiogram has been performed.  Adrienne [name] 11/03/2013, 10:59 AM

## 2013-11-03 NOTE — Progress Notes (Signed)
[facility] PROGRESS NOTE  Adrienne [name] EAV:[id number] DOB: February 21, 1936 DOA: 11/02/2013 PCP: [name], MD  Assessment/Plan: 1. Functional decline/failure to thrive. Likely multifactorial in nature, with psychotropic medications, dehydration, underlying infectious process, all likely contributors. In speaking with her daughter it is also possible that she may have taking increasing doses of her medication simply because often times she forgets that she has already taken them. I have discontinued her diuretics for now, providing gentle IV fluid hydration. Minimize psychotropic medications. She remains on empiric IV antibiotic therapy for possible community-acquired pneumonia. An MRI of brain has been done this morning will followup on results. Physical therapy and occupational therapy have been consulted. I patient will benefit greatly from rehabilitation at skilled nursing facility after this hospitalization. Social work consultation has been placed. 2. Deconditioning likely contributing to recurrent falls. I have consulted physical therapy. As mentioned above I think she would benefit from rehabilitation at [facility]. 3. Dehydration. I think in all likelihood she was dehydrated, with labs showing hemoconcentration. She had been on both Lasix and hydrochlorothiazide therapy. Both of these have been discontinued as if she is currently receiving gentle IV fluid hydration. 4. Suspected community acquire pneumonia. Chest x-ray showing possible infiltrates. She is on azithromycin and ceftriaxone. We'll continue providing supportive care, IV fluid hydration. 5. Hypertension. I have discontinued Lasix and hydrochlorothiazide given my suspicions for dehydration. Will continue Cozaar. 6. Type 2 diabetes mellitus. Continue Accu-Cheks q. a.c. and each bedtime with sliding scale coverage, will continue to hold metformin. 7. Recurrent falls. Likely multifactorial in nature, physical therapy  consultation. Followup on transthoracic echocardiogram and MRI today. 8. Rhabdomyolysis. Likely secondary to recurrent falls. She is currently on IV fluids.  Code Status: Full code Family Communication: I spoke with daughter over telephone  Disposition Plan: Continue supportive care, PT consult.    Consultants:  Physical therapy  Social work   Antibiotics:  Ceftriaxone  Azithromycin  HPI/Subjective: Patient is a pleasant 77 year old female with a past medical history of cognitive impairment who had been residing in the community, having a steep functional decline over the past week. Her daughter reported patient becoming increasing confused and having recurrent falls. A CT scan of head and neck showed no acute intracranial abnormalities with left temporal region scalp hematoma. No fracture was noted. A chest x-ray did show  increase in opacity which could reflect early infiltrate. She was started on IV fluids, empiric IV antibiotics, admitted to telemetry. No acute events overnight, this morning she reports "fullness" in her head.    Objective: Filed Vitals:   11/03/13 0624  BP: 126/54  Pulse: 94  Temp: 98.8 F (37.1 C)  Resp: 18    Intake/Output Summary (Last 24 hours) at 11/03/13 1338 Last data filed at 11/03/13 0935  Gross per 24 hour  Intake   1500 ml  Output      0 ml  Net   1500 ml   Filed Weights   11/02/13 2038  Weight: 101.3 kg (223 lb 5.2 oz)    Exam:   General:  Patient in no acute distress, laying comfortably.  Cardiovascular: Regular rate rhythm normal S1-S2  Respiratory: Clear to auscultation bilaterally no wheezing rhonchi or rales  Abdomen: Soft nontender nondistended  Musculoskeletal: No edema  Data Reviewed: Basic Metabolic Panel:  Recent Labs Lab 11/02/13 1700 11/02/13 1716 11/02/13 2240 11/03/13 0440  NA 142 142  --  141  K 3.5 3.4*  --  2.9*  CL 100 102  --  104  CO2 25  --   --  26  GLUCOSE 165* 164*  --  123*  BUN 14 14   --  13  CREATININE 0.56 0.70 0.61 0.64  CALCIUM 9.4  --   --  8.0*  MG  --   --   --  1.5   Liver Function Tests:  Recent Labs Lab 11/03/13 0440  AST 67*  ALT 42*  ALKPHOS 35*  BILITOT 0.5  PROT 5.7*  ALBUMIN 2.6*   No results found for this basename: LIPASE, AMYLASE,  in the last 168 hours No results found for this basename: AMMONIA,  in the last 168 hours CBC:  Recent Labs Lab 11/02/13 1700 11/02/13 1716 11/02/13 2240 11/03/13 0440  WBC 12.8*  --  10.3 8.4  NEUTROABS  --   --   --  6.3  HGB 14.8 16.0* 12.8 11.5*  HCT 44.1 47.0* 38.6 34.8*  MCV 97.1  --  97.7 98.3  PLT 240  --  217 230   Cardiac Enzymes:  Recent Labs Lab 11/02/13 1700 11/02/13 2240 11/03/13 0440 11/03/13 1106  CKTOTAL 1548* 1278* 946* 789*   BNP (last 3 results)  Recent Labs  10/06/13 2210 11/02/13 2240  PROBNP 532.5* 1074.0*   CBG:  Recent Labs Lab 11/02/13 2242 11/03/13 0010 11/03/13 0344 11/03/13 0746 11/03/13 1053  GLUCAP 137* 233* 110* 113* 119*    No results found for this or any previous visit (from the past 240 hour(s)).   Studies: Dg Chest 2 View  11/02/2013   CLINICAL DATA:  Cough, congestion.  EXAM: CHEST - 2 VIEW  COMPARISON:  10/06/2013  FINDINGS: Atheromatous aorta. Spurring in the lower thoracic spine. Mild cardiomegaly stable. Some increase in patchy lingular atelectasis or early infiltrate. Left lung clear. No effusion.  IMPRESSION: 1. Slight increase in patchy lingular atelectasis or infiltrate. 2.  Stable mild cardiomegaly.   Electronically Signed   By: Oley Balm M.D.   On: 11/02/2013 18:04   Dg Hip Complete Right  11/02/2013   CLINICAL DATA:  Fall, right hip pain.  EXAM: RIGHT HIP - COMPLETE 2+ VIEW  COMPARISON:  None.  FINDINGS: Mild degenerative changes in the hips bilaterally. No fracture, subluxation or dislocation. Advanced degenerative changes in the visualized lower lumbar spine.  IMPRESSION: No acute bony abnormality.   Electronically Signed    By: Charlett Nose M.D.   On: 11/02/2013 18:04   Ct Head Wo Contrast  11/02/2013   CLINICAL DATA:  Fall.  EXAM: CT HEAD WITHOUT CONTRAST  CT CERVICAL SPINE WITHOUT CONTRAST  TECHNIQUE: Multidetector CT imaging of the head and cervical spine was performed following the standard protocol without intravenous contrast. Multiplanar CT image reconstructions of the cervical spine were also generated.  COMPARISON:  10/06/2013  FINDINGS: CT HEAD FINDINGS  Ventricles are normal in configuration. There is ventricular and sulcal enlargement reflecting moderate atrophy. This is stable. There is also stable periventricular white matter hypoattenuation most consistent with chronic microvascular ischemic change. No parenchymal masses or mass effect. No evidence of a cortical infarct.  There are no extra-axial masses. Widened CSF space anterior to left temporal lobe may reflect an arachnoid cyst. It is stable.  There is a left temporal region scalp hematoma that extends to the left lateral periorbital region. No underlying skull fracture.  Minimal dependent fluid in the left maxillary sinus. Remaining sinuses are clear as are the mastoid air cells.  No intracranial hemorrhage.  CT CERVICAL SPINE FINDINGS  No fracture or  spondylolisthesis. There are degenerative changes with mild loss of disc height and small endplate osteophytes. There are more prominent facet degenerative changes. These findings are stable. The bones are also demineralized. No evidence of a disc herniation.  Soft tissues show heterogeneous enlargement of the left thyroid gland with calcifications. This is stable. There are stable vascular calcifications.  The lung apices are clear.  IMPRESSION: Head CT: No acute intracranial abnormalities. Left temporal regions scalp hematoma. No skull fracture.  Cervical CT:  No fracture or acute finding.   Electronically Signed   By: Amie Portland M.D.   On: 11/02/2013 17:53   Ct Cervical Spine Wo Contrast  11/02/2013    CLINICAL DATA:  Fall.  EXAM: CT HEAD WITHOUT CONTRAST  CT CERVICAL SPINE WITHOUT CONTRAST  TECHNIQUE: Multidetector CT imaging of the head and cervical spine was performed following the standard protocol without intravenous contrast. Multiplanar CT image reconstructions of the cervical spine were also generated.  COMPARISON:  10/06/2013  FINDINGS: CT HEAD FINDINGS  Ventricles are normal in configuration. There is ventricular and sulcal enlargement reflecting moderate atrophy. This is stable. There is also stable periventricular white matter hypoattenuation most consistent with chronic microvascular ischemic change. No parenchymal masses or mass effect. No evidence of a cortical infarct.  There are no extra-axial masses. Widened CSF space anterior to left temporal lobe may reflect an arachnoid cyst. It is stable.  There is a left temporal region scalp hematoma that extends to the left lateral periorbital region. No underlying skull fracture.  Minimal dependent fluid in the left maxillary sinus. Remaining sinuses are clear as are the mastoid air cells.  No intracranial hemorrhage.  CT CERVICAL SPINE FINDINGS  No fracture or spondylolisthesis. There are degenerative changes with mild loss of disc height and small endplate osteophytes. There are more prominent facet degenerative changes. These findings are stable. The bones are also demineralized. No evidence of a disc herniation.  Soft tissues show heterogeneous enlargement of the left thyroid gland with calcifications. This is stable. There are stable vascular calcifications.  The lung apices are clear.  IMPRESSION: Head CT: No acute intracranial abnormalities. Left temporal regions scalp hematoma. No skull fracture.  Cervical CT:  No fracture or acute finding.   Electronically Signed   By: Amie Portland M.D.   On: 11/02/2013 17:53   Mr Brain Wo Contrast  11/03/2013   CLINICAL DATA:  Frequent falls and syncopal events. Patient found down for 24-48 hr. Possible  CVA.  EXAM: MRI HEAD WITHOUT CONTRAST  TECHNIQUE: Multiplanar, multiecho pulse sequences of the brain and surrounding structures were obtained without intravenous contrast.  COMPARISON:  CT head without contrast 11/02/2013.  FINDINGS: No acute or focal intracranial abnormality is present. Moderate generalized atrophy and periventricular white matter disease is advanced for age. The ventricles are proportionate to the degree of atrophy.  Left temporal and parietal scalp soft tissue swelling and edema is evident. There is a focal area of susceptibility within the right temporal lobe, compatible with a punctate remote hemorrhage. There is asymmetric prominence of CSF within the left middle cranial fossa, likely representing an arachnoid cyst. No other significant extra-axial fluid collection is present.  Flow is present in the major intracranial arteries. The globes and orbits are intact. The paranasal side a cyst and right mastoid air cells are clear. There is minimal fluid within the left mastoid air cells. No obstructing nasopharyngeal lesion is evident.  IMPRESSION: 1. No acute intracranial abnormality. 2. Age advanced atrophy and moderate white  matter disease. This likely reflects the sequela of chronic microvascular ischemia. 3. Probable arachnoid cyst within the left middle cranial fossa. 4. Minimal left mastoid fluid. No obstructing nasopharyngeal lesion is evident.   Electronically Signed   By: Gennette Pac M.D.   On: 11/03/2013 09:47    Scheduled Meds: . aspirin EC  81 mg Oral Daily  . atorvastatin  20 mg Oral QAC supper  . azithromycin (ZITHROMAX) 500 MG IVPB  500 mg Intravenous Once  . azithromycin  500 mg Intravenous QHS  . cefTRIAXone (ROCEPHIN)  IV  1 g Intravenous Once  . cefTRIAXone (ROCEPHIN)  IV  1 g Intravenous QHS  . enoxaparin (LOVENOX) injection  40 mg Subcutaneous Q24H  . insulin aspart  0-15 Units Subcutaneous TID WC  . insulin aspart  0-5 Units Subcutaneous QHS  . losartan  50  mg Oral Daily  . memantine  10 mg Oral BID  . methocarbamol  500 mg Oral BID  . potassium chloride  40 mEq Oral Q6H  . sertraline  25 mg Oral q morning - 10a  . tobramycin-dexamethasone  2 drop Both Eyes QID   Continuous Infusions: . sodium chloride 75 mL/hr at 11/03/13 1000    Active Problems:   OSA (obstructive sleep apnea)   Headache(784.0)   Pneumonia   Syncope   Falls frequently   Anxiety state, unspecified   Depression   Unspecified hypothyroidism   HTN (hypertension)   HLD (hyperlipidemia)   Diabetes   Dementia with behavioral disturbance   Chronic pain syndrome    Time spent: 35 min    [name]  [facility] Pager [phone]. If 7PM-7AM, please contact night-coverage at [email or web address], password [facility] 11/03/2013, 1:38 PM  LOS: 1 day

## 2013-11-03 NOTE — Evaluation (Signed)
Physical Therapy Evaluation Patient Details Name: Adrienne [name] MRN: [id number] DOB: 12-25-1935 Today's Date: 11/03/2013 Time: 7829-5621 PT Time Calculation (min): 75 min  PT Assessment / Plan / Recommendation History of Present Illness  Pt admitted to hospital after being found on floor after fall  at her home (2 days). Pt with multifactorial reasons for fall according to H&P, now with generalized decontioning and failure to thrive, rhambdo, HTN, and dehydration .   Clinical Impression  Pt presents with great generalized weakness and inability to mobilize at mod I with AD at this time. To benefit from acute PT and f/u PT to increase strength and endurance to ambulate with in order to return home eventually as her goal.     PT Assessment  Patient needs continued PT services    Follow Up Recommendations  SNF    Does the patient have the potential to tolerate intense rehabilitation      Barriers to Discharge        Equipment Recommendations  None recommended by PT    Recommendations for Other Services     Frequency Min 3X/week    Precautions / Restrictions Precautions Precautions: Fall Precaution Comments: pt stated she has had a few falls at home. Pt was asble to many times verbalize that it was not safe for her to get up alone [facility] and showed me how to call for help from nursing.    Pertinent Vitals/Pain 3/10 pain on B lateral upper legs in general and some L shoulder pain as well.       Mobility  Bed Mobility Bed Mobility: Supine to Sit;Sit to Supine Supine to Sit: 3: Mod assist;HOB elevated;With rails Sit to Supine: Not Tested (comment) Details for Bed Mobility Assistance: cues for scooting and hand placement to assist.  Transfers Transfers: Sit to Stand;Stand to Sit Sit to Stand: 3: Mod assist;From bed;From chair/3-in-1 Stand to Sit: 4: Min assist;To chair/3-in-1 Details for Transfer Assistance: cues for RW hand placement and safety. assist for lift  up of all of the surfaces, challenged with this and scooting for transfer prep.  Ambulation/Gait Ambulation/Gait Assistance: 3: Mod assist Ambulation Distance (Feet): 5 Feet Assistive device: Rolling walker Ambulation/Gait Assistance Details: small steps , difficult with progression BLEs , small steps for transfer with rW from 3n1 to recliner. Not yet ready to walkw tih assistance to bathroom , needs 3n1 by bed with assist. Communicted this to nursing.  Gait Pattern: Decreased step length - right;Decreased step length - left;Trunk flexed Gait velocity: slow Stairs: No    Exercises     PT Diagnosis: Difficulty walking;Generalized weakness  PT Problem List: Decreased strength;Decreased activity tolerance;Decreased mobility;Decreased knowledge of use of DME PT Treatment Interventions: Gait training;DME instruction;Functional mobility training;Therapeutic activities;Therapeutic exercise;Patient/family education     PT Goals(Current goals can be found in the care plan section) Acute Rehab PT Goals Patient Stated Goal: I know I don't have enough strength to walk yet, especially by myself.I need to get stronger.  PT Goal Formulation: With patient Time For Goal Achievement: 11/17/13  Visit Information  Last PT Received On: 11/03/13 Assistance Needed: +2 (if you are going to walk a distance (+1 for transfers)) History of Present Illness: Pt admitted to hospital after being found on floor after fall  at her home (2 days). Pt with multifactorial reasons for fall according to H&P, now with generalized decontioning and failure to thrive, rhambdo, HTN, and dehydration .        Prior Functioning  Home Living Family/patient expects to be discharged to:: Private residence Living Arrangements: Alone Available Help at Discharge: Available PRN/intermittently;Family Type of Home: House Home Access: Stairs to enter [employer] of Steps: 5 Entrance Stairs-Rails: Right Home Layout: One  level Home Equipment: Walker - 2 wheels;Cane - single point Additional Comments: Pt's husband is at [facility]. Pt home alone (dtr brings her dinner and checks on her). Pt states she was driving and using RW and/or cane when up and moving, and independent with everything.  Prior Function Level of Independence: Independent Communication Communication: No difficulties    Cognition  Cognition Arousal/Alertness: Awake/alert Behavior During Therapy: WFL for tasks assessed/performed Overall Cognitive Status: Within Functional Limits for tasks assessed    Extremity/Trunk Assessment Lower Extremity Assessment Lower Extremity Assessment: Generalized weakness   Balance    End of Session PT - End of Session Equipment Utilized During Treatment: Gait belt Activity Tolerance: Patient tolerated treatment well Patient left: in chair;with call bell/phone within reach Nurse Communication: Mobility status  GP     Marella Bile 11/03/2013, 2:16 PM Marella Bile, PT Pager: [phone] 11/03/2013

## 2013-11-03 NOTE — Progress Notes (Signed)
ATTENTION:  TREATING MD  Patient's daughter - [name] at [phone] is requesting treating physician to please call her with update about her mom's plan of care if she is not here when MD round.  Georgianne Fick, RN

## 2013-11-04 DIAGNOSIS — R5381 Other malaise: Secondary | ICD-10-CM

## 2013-11-04 DIAGNOSIS — G9340 Encephalopathy, unspecified: Secondary | ICD-10-CM

## 2013-11-04 DIAGNOSIS — E860 Dehydration: Secondary | ICD-10-CM

## 2013-11-04 LAB — CBC WITH DIFFERENTIAL/PLATELET
Eosinophils Absolute: 0.2 10*3/uL (ref 0.0–0.7)
Eosinophils Relative: 3 % (ref 0–5)
HCT: 34.8 % — ABNORMAL LOW (ref 36.0–46.0)
Hemoglobin: 11.2 g/dL — ABNORMAL LOW (ref 12.0–15.0)
Lymphs Abs: 2.2 10*3/uL (ref 0.7–4.0)
MCH: 32 pg (ref 26.0–34.0)
MCV: 99.4 fL (ref 78.0–100.0)
Monocytes Absolute: 0.4 10*3/uL (ref 0.1–1.0)
Monocytes Relative: 5 % (ref 3–12)
Neutrophils Relative %: 61 % (ref 43–77)
Platelets: 184 10*3/uL (ref 150–400)
RBC: 3.5 MIL/uL — ABNORMAL LOW (ref 3.87–5.11)
WBC: 7.1 10*3/uL (ref 4.0–10.5)

## 2013-11-04 LAB — COMPREHENSIVE METABOLIC PANEL
AST: 61 U/L — ABNORMAL HIGH (ref 0–37)
Albumin: 2.3 g/dL — ABNORMAL LOW (ref 3.5–5.2)
Alkaline Phosphatase: 41 U/L (ref 39–117)
BUN: 14 mg/dL (ref 6–23)
CO2: 24 mEq/L (ref 19–32)
Calcium: 7.9 mg/dL — ABNORMAL LOW (ref 8.4–10.5)
Chloride: 108 mEq/L (ref 96–112)
GFR calc non Af Amer: 85 mL/min — ABNORMAL LOW (ref >90)
Potassium: 3.6 mEq/L (ref 3.5–5.1)
Total Bilirubin: 0.2 mg/dL — ABNORMAL LOW (ref 0.3–1.2)

## 2013-11-04 LAB — GLUCOSE, CAPILLARY
Glucose-Capillary: 119 mg/dL — ABNORMAL HIGH (ref 70–99)
Glucose-Capillary: 115 mg/dL — ABNORMAL HIGH (ref 70–99)
Glucose-Capillary: 118 mg/dL — ABNORMAL HIGH (ref 70–99)
Glucose-Capillary: 116 mg/dL — ABNORMAL HIGH (ref 70–99)

## 2013-11-04 LAB — CK
Total CK: 498 U/L — ABNORMAL HIGH (ref 7–177)
Total CK: 380 U/L — ABNORMAL HIGH (ref 7–177)
Total CK: 358 U/L — ABNORMAL HIGH (ref 7–177)
Total CK: 301 U/L — ABNORMAL HIGH (ref 7–177)

## 2013-11-04 LAB — MAGNESIUM: Magnesium: 1.6 mg/dL (ref 1.5–2.5)

## 2013-11-04 MED ORDER — TRAMADOL HCL 50 MG PO TABS
50.0000 mg | ORAL_TABLET | Freq: Once | ORAL | Status: AC
  Administered 2013-11-04: 02:00:00 50 mg via ORAL
  Filled 2013-11-04: qty 1

## 2013-11-04 MED ORDER — QUETIAPINE 12.5 MG HALF TABLET
12.5000 mg | ORAL_TABLET | Freq: Every day | ORAL | Status: DC
  Administered 2013-11-04: 23:00:00 12.5 mg via ORAL
  Filled 2013-11-04 (×2): qty 1

## 2013-11-04 MED ORDER — AZITHROMYCIN 500 MG PO TABS
500.0000 mg | ORAL_TABLET | Freq: Every day | ORAL | Status: DC
  Administered 2013-11-04: 500 mg via ORAL
  Filled 2013-11-04 (×2): qty 1

## 2013-11-04 NOTE — Evaluation (Signed)
Occupational Therapy Evaluation Patient Details Name: Adrienne [name] MRN: [id number] DOB: 08-02-1936 Today's Date: 11/04/2013 Time: 1478-2956 OT Time Calculation (min): 22 min  OT Assessment / Plan / Recommendation History of present illness Pt admitted to hospital after being found on floor after fall  at her home (2 days). Pt with multifactorial reasons for fall according to H&P, now with generalized decontioning and failure to thrive, rhambdo, HTN, and dehydration .    Clinical Impression   Pt was admitted for the above.  She will benefit from skilled OT to increase strength and activity tolerance for adls with min A level goals in acute.  Prior to admission, pt was mod I, but she struggled with LB adls    OT Assessment  Patient needs continued OT Services    Follow Up Recommendations  SNF    Barriers to Discharge Decreased caregiver support    Equipment Recommendations  3 in 1 bedside comode    Recommendations for Other Services    Frequency  Min 2X/week    Precautions / Restrictions Precautions Precautions: Fall Precaution Comments: pt stated she has had a few falls at home. Pt was asble to many times verbalize that it was not safe for her to get up alone [facility] and showed me how to call for help from nursing.  Restrictions Weight Bearing Restrictions: No   Pertinent Vitals/Pain Bil UEs sore with movement--not rated.  Modified activity    ADL  Grooming: Set up Where Assessed - Grooming: Unsupported sitting Upper Body Bathing: Set up Where Assessed - Upper Body Bathing: Unsupported sitting Lower Body Bathing: Moderate assistance Where Assessed - Lower Body Bathing: Supported sit to stand Upper Body Dressing: Set up Where Assessed - Upper Body Dressing: Unsupported sitting Lower Body Dressing: Moderate assistance (with reacher) Where Assessed - Lower Body Dressing: Supported sit to stand Equipment Used: Rolling walker Transfers/Ambulation Related to  ADLs: performed sit to stand only.  Pt stands with trunk and neck flexed; has difficulty straightening out ADL Comments: Pt has been able to perform adls but has difficulty reaching to feet.  She has a Sports administrator at home.  Simulated pants with this:  she needs reinforcement.  Tried sock aid, but this did not work for her--she may be able to use it with powder to reduce friction    OT Diagnosis: Generalized weakness  OT Problem List: Decreased strength;Decreased activity tolerance;Impaired balance (sitting and/or standing);Decreased knowledge of use of DME or AE;Pain OT Treatment Interventions: Self-care/ADL training;DME and/or AE instruction;Patient/family education;Balance training;Therapeutic activities   OT Goals(Current goals can be found in the care plan section) Acute Rehab OT Goals Patient Stated Goal: get strength back OT Goal Formulation: With patient Time For Goal Achievement: 11/19/12 Potential to Achieve Goals: Good ADL Goals Pt Will Perform Lower Body Bathing: with min assist;sit to/from stand;with adaptive equipment Pt Will Perform Lower Body Dressing: with min assist;with adaptive equipment;sit to/from stand Pt Will Transfer to Toilet: with min assist;ambulating;bedside commode Pt Will Perform Toileting - Clothing Manipulation and hygiene: with min assist;sit to/from stand  Visit Information  Last OT Received On: 11/04/13 Assistance Needed: +2 (ambulation) History of Present Illness: Pt admitted to hospital after being found on floor after fall  at her home (2 days). Pt with multifactorial reasons for fall according to H&P, now with generalized decontioning and failure to thrive, rhambdo, HTN, and dehydration .        Prior Functioning     Home Living Family/patient expects to be discharged  to:: Private residence Living Arrangements: Alone Additional Comments: Pt's husband is at [facility]. Pt home alone (dtr brings her dinner and checks on her). Pt states  she was driving and using RW and/or cane when up and moving, and independent with everything.  Prior Function Level of Independence: Independent Communication Communication: No difficulties Dominant Hand: Right         Vision/Perception     Cognition  Cognition Arousal/Alertness: Awake/alert Behavior During Therapy: WFL for tasks assessed/performed Overall Cognitive Status: Within Functional Limits for tasks assessed    Extremity/Trunk Assessment Upper Extremity Assessment Upper Extremity Assessment: RUE deficits/detail;LUE deficits/detail RUE Deficits / Details: pt is limited to about 80 degrees AROM of shoulder; has arthritis throughout body:  painful beyond this LUE Deficits / Details: Pt limited to 90 shoulder flexion; painful beyond this.  Presents with general weakness     Mobility Transfers Sit to Stand: 3: Mod assist;With upper extremity assist;From chair/3-in-1 Stand to Sit: 4: Min assist;To chair/3-in-1 Details for Transfer Assistance: cues for hand placement     Exercise     Balance     End of Session OT - End of Session Activity Tolerance: Patient tolerated treatment well Patient left: in chair;with call bell/phone within reach  GO     Taunja Brickner 11/04/2013, 12:45 PM Marica Otter, OTR/L [phone] 11/04/2013

## 2013-11-04 NOTE — Progress Notes (Signed)
Clinical Social Work Department CLINICAL SOCIAL WORK PLACEMENT NOTE 11/04/2013  Patient:  Adrienne [name], Adrienne [name]  Account Number:  [id number] Admit date:  11/02/2013  Clinical Social Worker:  Becky Sax, LCSW  Date/time:  11/04/2013 12:00 M  Clinical Social Work is seeking post-discharge placement for this patient at the following level of care:   SKILLED NURSING   (*CSW will update this form in Epic as items are completed)   11/04/2013  Patient/family provided with [facility] Department of Clinical Social Work's list of facilities offering this level of care within the geographic area requested by the patient (or if unable, by the patient's family).  11/04/2013  Patient/family informed of their freedom to choose among providers that offer the needed level of care, that participate in Medicare, Medicaid or managed care program needed by the patient, have an available bed and are willing to accept the patient.  11/04/2013  Patient/family informed of [facility]' ownership interest in [facility], as well as of the fact that they are under no obligation to receive care at this facility.  PASARR submitted to [employer] on 11/04/2013 PASARR number received from [employer] on 11/04/2013  FL2 transmitted to all facilities in geographic area requested by pt/family on  11/04/2013 FL2 transmitted to all facilities within larger geographic area on 11/04/2013  Patient informed that his/her managed care company has contracts with or will negotiate with  certain facilities, including the following:     Patient/family informed of bed offers received:   Patient chooses bed at  Physician recommends and patient chooses bed at    Patient to be transferred to  on   Patient to be transferred to facility by   The following physician request were entered in Epic:   Additional Comments:

## 2013-11-04 NOTE — Progress Notes (Signed)
This patient is receiving the antibiotic Azithromycin by the intravenous route. Based on criteria approved by the Pharmacy and Therapeutics Committee, and the Infectious Disease Division, the antibiotic is being converted to equivalent oral dose form(s). These criteria include: . Patient being treated for a respiratory tract infection, urinary tract infection, cellulitis, or Clostridium Difficile Associated Diarrhea . The patient is not neutropenic and does not exhibit a GI malabsorption state . The patient is eating (either orally or per tube) and/or has been taking other orally administered medications for at least 24 hours. . The patient is improving clinically (physician assessment and a 24-hour Tmax of < 100.5 F).  If you have questions about this conversion, please contact the pharmacy department. Thank you.  Clance Boll, PharmD, BCPS Pager: [phone] 11/04/2013 12:49 PM

## 2013-11-04 NOTE — Progress Notes (Signed)
Patient refused CPAP at this time.  Patient states she does not wear CPAP at home. She has tried it before but she just can't tolerate it. She states she knows she probably should wear it and she apologizes for not being compliant but she is just not able to.  Patient encouraged to call RT if she decides she would like to wear CPAP.

## 2013-11-04 NOTE — Progress Notes (Signed)
Clinical Social Work Department BRIEF PSYCHOSOCIAL ASSESSMENT 11/04/2013  Patient:  Adrienne [name], Adrienne [name]     Account Number:  [id number]     Admit date:  11/02/2013  Clinical Social Worker:  Hattie Perch  Date/Time:  11/04/2013 12:00 M  Referred by:  Physician  Date Referred:  11/04/2013 Referred for  SNF Placement   Other Referral:   Interview type:  Patient Other interview type:    PSYCHOSOCIAL DATA Living Status:  ALONE Admitted from facility:   Level of care:   Primary support name:  [name] Primary support relationship to patient:  CHILD, ADULT Degree of support available:   good    CURRENT CONCERNS Current Concerns  Post-Acute Placement   Other Concerns:    SOCIAL WORK ASSESSMENT / PLAN CSW met with patient. patient is alert and oriented X3, however, patient had some moments of forgetfulness. She appeared frustrated when trying to recall certian information and couldnt. For example, when CSW asked if she had been seen by physical therapy or what date she came into the hospital. CSW offered support and reassured her. DIscussed need for snf placement. Patient states that her husband is a resident of [facility] and if she needs to go to rehab, she would like to go there.   Assessment/plan status:   Other assessment/ plan:   Information/referral to community resources:    PATIENT'S/FAMILY'S RESPONSE TO PLAN OF CARE: Patient is pleasant and cooperative. She is very familiar to snf process due to previous experience with her husband. She is interested in going to [facility] and hopeful that some short term snf will help her improve and be able to go back home.

## 2013-11-04 NOTE — Progress Notes (Signed)
[facility] PROGRESS NOTE  Adrienne [name] ZOX:[id number] DOB: [birth year] DOA: 11/02/2013 PCP: Adrienne Moh, MD  Assessment/Plan: 1. Functional decline/failure to thrive. Likely multifactorial in nature, with psychotropic medications, dehydration, underlying infectious process, all likely contributors. In speaking with her daughter it is also possible that she may have taking increasing doses of her medication simply because often times she forgets that she has already taken them. I have discontinued her diuretics for now, providing gentle IV fluid hydration. Minimize psychotropic medications. She remains on empiric IV antibiotic therapy for possible community-acquired pneumonia. An MRI of brain performed on 11/03/2013 did not reveal acute stroke. Physical therapy and occupational therapy have been consulted. Social work consult for SNF placement.. 2. Deconditioning likely contributing to recurrent falls. I have consulted physical therapy. As mentioned above I think she would benefit from rehabilitation at [facility]. 3. Dehydration. I think in all likelihood she was dehydrated, with labs showing hemoconcentration. She had been on both Lasix and hydrochlorothiazide therapy. Both of these have been discontinued as if she is currently receiving gentle IV fluid hydration. 4. Suspected community acquire pneumonia. Chest x-ray showing possible infiltrates. She is on azithromycin and ceftriaxone. We'll continue providing supportive care, IV fluid hydration. 5. Hypertension. I have discontinued Lasix and hydrochlorothiazide given my suspicions for dehydration. Will continue Cozaar. 6. Type 2 diabetes mellitus. Continue Accu-Cheks q. a.c. and each bedtime with sliding scale coverage, will continue to hold metformin. 7. Recurrent falls. Likely multifactorial in nature, physical therapy consultation. Followup on transthoracic echocardiogram and MRI today. 8. Rhabdomyolysis. Likely secondary to recurrent  falls. She is currently on IV fluids. CPK trending down. 9. Dementia with behavioral changes. Overnight becoming agitated, will provide Seroquel 12.5 mg by mouth each bedtime.  Code Status: Full code Family Communication: I spoke with daughter over telephone  Disposition Plan: Social work consult for SNF placement   Consultants:  Physical therapy  Social work   Antibiotics:  Ceftriaxone  Azithromycin  HPI/Subjective: Patient is a pleasant 77 year old female with a past medical history of cognitive impairment who had been residing in the community, having a steep functional decline over the past week. Her daughter reported patient becoming increasing confused and having recurrent falls. A CT scan of head and neck showed no acute intracranial abnormalities with left temporal region scalp hematoma. No fracture was noted. A chest x-ray did show  increase in opacity which could reflect early infiltrate. She was started on IV fluids, empiric IV antibiotics, admitted to telemetry. Patient becoming agitated and restless overnight I suspect she was sundowning.   Objective: Filed Vitals:   11/04/13 1351  BP: 132/51  Pulse: 83  Temp: 97.9 F (36.6 C)  Resp: 16    Intake/Output Summary (Last 24 hours) at 11/04/13 1441 Last data filed at 11/04/13 0700  Gross per 24 hour  Intake   2790 ml  Output      0 ml  Net   2790 ml   Filed Weights   11/02/13 2038  Weight: 101.3 kg (223 lb 5.2 oz)    Exam:   General:  Patient in no acute distress, laying comfortably.  Cardiovascular: Regular rate rhythm normal S1-S2  Respiratory: Clear to auscultation bilaterally no wheezing rhonchi or rales  Abdomen: Soft nontender nondistended  Musculoskeletal: No edema  Data Reviewed: Basic Metabolic Panel:  Recent Labs Lab 11/02/13 1700 11/02/13 1716 11/02/13 2240 11/03/13 0440 11/04/13 0230  NA 142 142  --  141 140  K 3.5 3.4*  --  2.9* 3.6  CL 100 102  --  104 108  CO2 25  --   --   26 24  GLUCOSE 165* 164*  --  123* 131*  BUN 14 14  --  13 14  CREATININE 0.56 0.70 0.61 0.64 0.65  CALCIUM 9.4  --   --  8.0* 7.9*  MG  --   --   --  1.5 1.6   Liver Function Tests:  Recent Labs Lab 11/03/13 0440 11/04/13 0230  AST 67* 61*  ALT 42* 40*  ALKPHOS 35* 41  BILITOT 0.5 0.2*  PROT 5.7* 5.5*  ALBUMIN 2.6* 2.3*   No results found for this basename: LIPASE, AMYLASE,  in the last 168 hours No results found for this basename: AMMONIA,  in the last 168 hours CBC:  Recent Labs Lab 11/02/13 1700 11/02/13 1716 11/02/13 2240 11/03/13 0440 11/04/13 0230  WBC 12.8*  --  10.3 8.4 7.1  NEUTROABS  --   --   --  6.3 4.3  HGB 14.8 16.0* 12.8 11.5* 11.2*  HCT 44.1 47.0* 38.6 34.8* 34.8*  MCV 97.1  --  97.7 98.3 99.4  PLT 240  --  217 230 184   Cardiac Enzymes:  Recent Labs Lab 11/03/13 1106 11/03/13 1447 11/03/13 2112 11/04/13 0231 11/04/13 0923  CKTOTAL 789* 730* 590* 498* 380*   BNP (last 3 results)  Recent Labs  10/06/13 2210 11/02/13 2240  PROBNP 532.5* 1074.0*   CBG:  Recent Labs Lab 11/03/13 1053 11/03/13 1659 11/03/13 2108 11/04/13 0740 11/04/13 1125  GLUCAP 119* 106* 105* 119* 115*    Recent Results (from the past 240 hour(s))  CULTURE, BLOOD (ROUTINE X 2)     Status: None   Collection Time    11/02/13 10:40 PM      Result Value Range Status   Specimen Description BLOOD LEFT ANTECUBITAL   Final   Special Requests BOTTLES DRAWN AEROBIC AND ANAEROBIC    Final   Culture  Setup Time     Final   Value: 11/03/2013 00:38     Performed at [facility]   Culture     Final   Value:        BLOOD CULTURE RECEIVED NO GROWTH TO DATE CULTURE WILL BE HELD FOR 5 DAYS BEFORE ISSUING A FINAL NEGATIVE REPORT     Performed at [facility]   Report Status PENDING   Incomplete  CULTURE, BLOOD (ROUTINE X 2)     Status: None   Collection Time    11/02/13 10:45 PM      Result Value Range Status   Specimen Description BLOOD LEFT HAND    Final   Special Requests BOTTLES DRAWN AEROBIC AND ANAEROBIC    Final   Culture  Setup Time     Final   Value: 11/03/2013 00:38     Performed at [facility]   Culture     Final   Value:        BLOOD CULTURE RECEIVED NO GROWTH TO DATE CULTURE WILL BE HELD FOR 5 DAYS BEFORE ISSUING A FINAL NEGATIVE REPORT     Performed at [facility]   Report Status PENDING   Incomplete     Studies: Dg Chest 2 View  11/02/2013   CLINICAL DATA:  Cough, congestion.  EXAM: CHEST - 2 VIEW  COMPARISON:  10/06/2013  FINDINGS: Atheromatous aorta. Spurring in the lower thoracic spine. Mild cardiomegaly stable. Some increase in patchy lingular atelectasis or  early infiltrate. Left lung clear. No effusion.  IMPRESSION: 1. Slight increase in patchy lingular atelectasis or infiltrate. 2.  Stable mild cardiomegaly.   Electronically Signed   By: Oley Balm M.D.   On: 11/02/2013 18:04   Dg Hip Complete Right  11/02/2013   CLINICAL DATA:  Fall, right hip pain.  EXAM: RIGHT HIP - COMPLETE 2+ VIEW  COMPARISON:  None.  FINDINGS: Mild degenerative changes in the hips bilaterally. No fracture, subluxation or dislocation. Advanced degenerative changes in the visualized lower lumbar spine.  IMPRESSION: No acute bony abnormality.   Electronically Signed   By: Charlett Nose M.D.   On: 11/02/2013 18:04   Ct Head Wo Contrast  11/02/2013   CLINICAL DATA:  Fall.  EXAM: CT HEAD WITHOUT CONTRAST  CT CERVICAL SPINE WITHOUT CONTRAST  TECHNIQUE: Multidetector CT imaging of the head and cervical spine was performed following the standard protocol without intravenous contrast. Multiplanar CT image reconstructions of the cervical spine were also generated.  COMPARISON:  10/06/2013  FINDINGS: CT HEAD FINDINGS  Ventricles are normal in configuration. There is ventricular and sulcal enlargement reflecting moderate atrophy. This is stable. There is also stable periventricular white matter hypoattenuation most consistent with  chronic microvascular ischemic change. No parenchymal masses or mass effect. No evidence of a cortical infarct.  There are no extra-axial masses. Widened CSF space anterior to left temporal lobe may reflect an arachnoid cyst. It is stable.  There is a left temporal region scalp hematoma that extends to the left lateral periorbital region. No underlying skull fracture.  Minimal dependent fluid in the left maxillary sinus. Remaining sinuses are clear as are the mastoid air cells.  No intracranial hemorrhage.  CT CERVICAL SPINE FINDINGS  No fracture or spondylolisthesis. There are degenerative changes with mild loss of disc height and small endplate osteophytes. There are more prominent facet degenerative changes. These findings are stable. The bones are also demineralized. No evidence of a disc herniation.  Soft tissues show heterogeneous enlargement of the left thyroid gland with calcifications. This is stable. There are stable vascular calcifications.  The lung apices are clear.  IMPRESSION: Head CT: No acute intracranial abnormalities. Left temporal regions scalp hematoma. No skull fracture.  Cervical CT:  No fracture or acute finding.   Electronically Signed   By: Amie Portland M.D.   On: 11/02/2013 17:53   Ct Cervical Spine Wo Contrast  11/02/2013   CLINICAL DATA:  Fall.  EXAM: CT HEAD WITHOUT CONTRAST  CT CERVICAL SPINE WITHOUT CONTRAST  TECHNIQUE: Multidetector CT imaging of the head and cervical spine was performed following the standard protocol without intravenous contrast. Multiplanar CT image reconstructions of the cervical spine were also generated.  COMPARISON:  10/06/2013  FINDINGS: CT HEAD FINDINGS  Ventricles are normal in configuration. There is ventricular and sulcal enlargement reflecting moderate atrophy. This is stable. There is also stable periventricular white matter hypoattenuation most consistent with chronic microvascular ischemic change. No parenchymal masses or mass effect. No evidence  of a cortical infarct.  There are no extra-axial masses. Widened CSF space anterior to left temporal lobe may reflect an arachnoid cyst. It is stable.  There is a left temporal region scalp hematoma that extends to the left lateral periorbital region. No underlying skull fracture.  Minimal dependent fluid in the left maxillary sinus. Remaining sinuses are clear as are the mastoid air cells.  No intracranial hemorrhage.  CT CERVICAL SPINE FINDINGS  No fracture or spondylolisthesis. There are degenerative changes with mild  loss of disc height and small endplate osteophytes. There are more prominent facet degenerative changes. These findings are stable. The bones are also demineralized. No evidence of a disc herniation.  Soft tissues show heterogeneous enlargement of the left thyroid gland with calcifications. This is stable. There are stable vascular calcifications.  The lung apices are clear.  IMPRESSION: Head CT: No acute intracranial abnormalities. Left temporal regions scalp hematoma. No skull fracture.  Cervical CT:  No fracture or acute finding.   Electronically Signed   By: Amie Portland M.D.   On: 11/02/2013 17:53   Mr Brain Wo Contrast  11/03/2013   CLINICAL DATA:  Frequent falls and syncopal events. Patient found down for 24-48 hr. Possible CVA.  EXAM: MRI HEAD WITHOUT CONTRAST  TECHNIQUE: Multiplanar, multiecho pulse sequences of the brain and surrounding structures were obtained without intravenous contrast.  COMPARISON:  CT head without contrast 11/02/2013.  FINDINGS: No acute or focal intracranial abnormality is present. Moderate generalized atrophy and periventricular white matter disease is advanced for age. The ventricles are proportionate to the degree of atrophy.  Left temporal and parietal scalp soft tissue swelling and edema is evident. There is a focal area of susceptibility within the right temporal lobe, compatible with a punctate remote hemorrhage. There is asymmetric prominence of CSF  within the left middle cranial fossa, likely representing an arachnoid cyst. No other significant extra-axial fluid collection is present.  Flow is present in the major intracranial arteries. The globes and orbits are intact. The paranasal side a cyst and right mastoid air cells are clear. There is minimal fluid within the left mastoid air cells. No obstructing nasopharyngeal lesion is evident.  IMPRESSION: 1. No acute intracranial abnormality. 2. Age advanced atrophy and moderate white matter disease. This likely reflects the sequela of chronic microvascular ischemia. 3. Probable arachnoid cyst within the left middle cranial fossa. 4. Minimal left mastoid fluid. No obstructing nasopharyngeal lesion is evident.   Electronically Signed   By: Gennette Pac M.D.   On: 11/03/2013 09:47    Scheduled Meds: . aspirin EC  81 mg Oral Daily  . atorvastatin  20 mg Oral QAC supper  . azithromycin  500 mg Oral QHS  . cefTRIAXone (ROCEPHIN)  IV  1 g Intravenous Once  . cefTRIAXone (ROCEPHIN)  IV  1 g Intravenous QHS  . enoxaparin (LOVENOX) injection  40 mg Subcutaneous Q24H  . insulin aspart  0-15 Units Subcutaneous TID WC  . insulin aspart  0-5 Units Subcutaneous QHS  . losartan  50 mg Oral Daily  . memantine  10 mg Oral BID  . methocarbamol  500 mg Oral BID  . QUEtiapine  12.5 mg Oral QHS  . sertraline  25 mg Oral q morning - 10a  . tobramycin-dexamethasone  2 drop Both Eyes QID   Continuous Infusions: . sodium chloride 75 mL/hr at 11/04/13 0906    Active Problems:   OSA (obstructive sleep apnea)   Headache(784.0)   Pneumonia   Syncope   Falls frequently   Anxiety state, unspecified   Depression   Unspecified hypothyroidism   HTN (hypertension)   HLD (hyperlipidemia)   Diabetes   Dementia with behavioral disturbance   Chronic pain syndrome    Time spent: 35 min    [name]  [facility] Pager [phone]. If 7PM-7AM, please contact night-coverage at [email or web address],  password [facility] 11/04/2013, 2:41 PM  LOS: 2 days

## 2013-11-04 NOTE — Progress Notes (Signed)
Patient is very uncomfortable and could not stay still in bed.  Complain of generalized weakness and headache, Fioricet did not help, called on call coverage - Lenny Pastel, order received.

## 2013-11-05 DIAGNOSIS — E876 Hypokalemia: Secondary | ICD-10-CM

## 2013-11-05 LAB — COMPREHENSIVE METABOLIC PANEL
ALT: 42 U/L — ABNORMAL HIGH (ref 0–35)
Albumin: 2.6 g/dL — ABNORMAL LOW (ref 3.5–5.2)
BUN: 11 mg/dL (ref 6–23)
Calcium: 8.1 mg/dL — ABNORMAL LOW (ref 8.4–10.5)
Chloride: 108 mEq/L (ref 96–112)
Creatinine, Ser: 0.55 mg/dL (ref 0.50–1.10)
GFR calc non Af Amer: 89 mL/min — ABNORMAL LOW (ref >90)
Sodium: 140 mEq/L (ref 135–145)
Total Bilirubin: 0.2 mg/dL — ABNORMAL LOW (ref 0.3–1.2)

## 2013-11-05 LAB — CBC WITH DIFFERENTIAL/PLATELET
Basophils Absolute: 0 10*3/uL (ref 0.0–0.1)
HCT: 35.5 % — ABNORMAL LOW (ref 36.0–46.0)
Hemoglobin: 11.7 g/dL — ABNORMAL LOW (ref 12.0–15.0)
Lymphocytes Relative: 29 % (ref 12–46)
MCV: 99.7 fL (ref 78.0–100.0)
Monocytes Relative: 6 % (ref 3–12)
Neutro Abs: 4 10*3/uL (ref 1.7–7.7)
Platelets: 223 10*3/uL (ref 150–400)
RBC: 3.56 MIL/uL — ABNORMAL LOW (ref 3.87–5.11)
RDW: 14.5 % (ref 11.5–15.5)
WBC: 6.7 10*3/uL (ref 4.0–10.5)

## 2013-11-05 LAB — GLUCOSE, CAPILLARY: Glucose-Capillary: 121 mg/dL — ABNORMAL HIGH (ref 70–99)

## 2013-11-05 LAB — CK: Total CK: 220 U/L — ABNORMAL HIGH (ref 7–177)

## 2013-11-05 LAB — MAGNESIUM: Magnesium: 1.7 mg/dL (ref 1.5–2.5)

## 2013-11-05 MED ORDER — BUTALBITAL-APAP-CAFFEINE 50-325-40 MG PO TABS: 1.0000 | ORAL_TABLET | Freq: Four times a day (QID) | ORAL | Status: DC | PRN

## 2013-11-05 MED ORDER — CEFUROXIME AXETIL 500 MG PO TABS: 500.0000 mg | ORAL_TABLET | Freq: Two times a day (BID) | ORAL | Status: DC

## 2013-11-05 MED ORDER — LOSARTAN POTASSIUM 50 MG PO TABS: 50.0000 mg | ORAL_TABLET | Freq: Every day | ORAL | Status: DC

## 2013-11-05 MED ORDER — QUETIAPINE 12.5 MG HALF TABLET: 12.5000 mg | ORAL_TABLET | Freq: Every day | ORAL | Status: DC

## 2013-11-05 MED ORDER — TOBRAMYCIN-DEXAMETHASONE 0.3-0.1 % OP SUSP: 2.0000 [drp] | Freq: Four times a day (QID) | OPHTHALMIC | Status: DC

## 2013-11-05 NOTE — Progress Notes (Signed)
Physical Therapy Treatment Patient Details Name: Adrienne [name] MRN: [id number] DOB: 1935-12-14 Today's Date: 11/05/2013 Time: 4098-1191 PT Time Calculation (min): 35 min  PT Assessment / Plan / Recommendation  History of Present Illness Pt admitted to hospital after being found on floor after fall  at her home (2 days). Pt with multifactorial reasons for fall according to H&P, now with generalized decontioning and failure to thrive, rhambdo, HTN, and dehydration .    PT Comments   Progressing with mobility.   Follow Up Recommendations        Does the patient have the potential to tolerate intense rehabilitation     Barriers to Discharge        Equipment Recommendations       Recommendations for Other Services    Frequency     Progress towards PT Goals Progress towards PT goals: Progressing toward goals  Plan      Precautions / Restrictions Precautions Precautions: Fall Precaution Comments: pt stated she has had a few falls at home. Pt was able to many times verbalize that it was not safe for her to get up alone here at [facility] and showed me how to call for help from nursing.  Restrictions Weight Bearing Restrictions: No   Pertinent Vitals/Pain L hip 5/10 with activity    Mobility  Bed Mobility Bed Mobility: Supine to Sit Supine to Sit: 2: Max assist Details for Bed Mobility Assistance: Increased time. Multimodal cues for safety, technique hand placement. Assist for trunk to upright and bil LEs off bed. Utilized bedpad for scooting, positioning Transfers Transfers: Sit to Stand;Stand to [employer] Transfers Sit to Stand: 3: Mod assist;From chair/3-in-1;From bed Stand to Sit: 3: Mod assist;To chair/3-in-1 Stand Pivot Transfers: 3: Mod assist Details for Transfer Assistance: Assist to rise, stabilize, control descent. Multimodal cues for safety, technique, hand placement Ambulation/Gait Ambulation/Gait Assistance: 3: Mod assist Ambulation Distance (Feet): 7  Feet Ambulation/Gait Assistance Details: 7 steps forward, 7 steps backward. Assist ot stabilize and maneuver with walker. Fatigues fairly easily.  Gait Pattern: Decreased step length - right;Decreased step length - left;Trunk flexed    Exercises     PT Diagnosis:    PT Problem List:   PT Treatment Interventions:     PT Goals (current goals can now be found in the care plan section)    Visit Information  Last PT Received On: 11/05/13 Assistance Needed: +2 History of Present Illness: Pt admitted to hospital after being found on floor after fall  at her home (2 days). Pt with multifactorial reasons for fall according to H&P, now with generalized decontioning and failure to thrive, rhambdo, HTN, and dehydration .     Subjective Data      Cognition  Cognition Arousal/Alertness: Awake/alert Behavior During Therapy: WFL for tasks assessed/performed Overall Cognitive Status: Within Functional Limits for tasks assessed    Balance     End of Session     GP     [name], MPT Pager: [phone]

## 2013-11-05 NOTE — Progress Notes (Signed)
Patient is set to discharge to [facility] today. Patient & daughter aware. Discharge packet in [place]. [facility] called for transport (Service Request Id: [id number]).   Clinical Social Work Department CLINICAL SOCIAL WORK PLACEMENT NOTE 11/05/2013  Patient:  Adrienne [name], Adrienne [name]  Account Number:  [id number] Admit date:  11/02/2013  Clinical Social Worker:  Becky Sax, LCSW  Date/time:  11/04/2013 12:00 M  Clinical Social Work is seeking post-discharge placement for this patient at the following level of care:   SKILLED NURSING   (*CSW will update this form in Epic as items are completed)   11/04/2013  Patient/family provided with [facility] Department of Clinical Social Work's list of facilities offering this level of care within the geographic area requested by the patient (or if unable, by the patient's family).  11/04/2013  Patient/family informed of their freedom to choose among providers that offer the needed level of care, that participate in Medicare, Medicaid or managed care program needed by the patient, have an available bed and are willing to accept the patient.  11/04/2013  Patient/family informed of [facility]' ownership interest in [facility], as well as of the fact that they are under no obligation to receive care at this facility.  PASARR submitted to EDS on 11/04/2013 PASARR number received from EDS on 11/04/2013  FL2 transmitted to all facilities in geographic area requested by pt/family on  11/04/2013 FL2 transmitted to all facilities within larger geographic area on 11/04/2013  Patient informed that his/her managed care company has contracts with or will negotiate with  certain facilities, including the following:     Patient/family informed of bed offers received:  11/05/2013 Patient chooses bed at [facility] Physician recommends and patient chooses bed at    Patient to be transferred to [facility]  on  11/05/2013 Patient to be transferred to facility by [facility]  The following physician request were entered in Epic:   Additional Comments:   Unice Bailey, LCSW [facility] Clinical Social Worker cell #: [phone]

## 2013-11-05 NOTE — Discharge Summary (Signed)
Physician Discharge Summary  Adrienne [name] ZSW:[id number] DOB: 11/04/1936 DOA: 11/02/2013  PCP: Adrienne Moh, MD  Admit date: 11/02/2013 Discharge date: 11/05/2013  Time spent: 35 minutes  Recommendations for Outpatient Follow-up:  1. Please follow up on BMP and CBC in 1 week  Discharge Diagnoses:  Active Problems:   OSA (obstructive sleep apnea)   Headache(784.0)   Pneumonia   Syncope   Falls frequently   Anxiety state, unspecified   Depression   Unspecified hypothyroidism   HTN (hypertension)   HLD (hyperlipidemia)   Diabetes   Dementia with behavioral disturbance   Chronic pain syndrome   Discharge Condition: Stable/Improve  Diet recommendation: Heart Healthy  [facility] Weights   11/02/13 2038 11/05/13 0645  Weight: 101.3 kg (223 lb 5.2 oz) 105.3 kg (232 lb 2.3 oz)    History of present illness:  Adrienne [name] is a 77 y.o. WF PMHx Anxiety, Depression, Hypothyroidism, HTN, HLD, OSA Presented to ED after being found down. She states she fell between her bed and the wall and was picked up by the EMS 2 days later after she was found by her kids. Patient cannot recall the events the left her being on the floor. She does have a history of dementia. She was found in her urine and feces by EMS. Here she is hemodynamically stable. She was placed in a c-collar for spine precautions and because she had neck pain on exam. Her lungs are clear. Her belly is distended but nontender. She has a deformity to her right hip area which is tender. Currently patient in c-collar patient unsure of how long she was down when found by neighbor however daughter believes it had been 1-2 days this because newspaper had not been picked up. States has fallen 2-3 times over the past 18 month usually when bending over. States becomes lightheaded and will fall. States is also been having migraine headaches which started approximately a month ago. States headache starts top of the head and radiates  around entire head and face. Negative photophobia/photophobia, plus nausea. States has seen neurologist Dr. Lesia [name], who started her on Fioricet and Neurontin. Patient states feels it does help headaches but does not totally resolve them. Patient and daughter unsure of how much/how often patient is using her fever set and Neurontin. Daughter afraid these medications playing into patient's frequent falls.  Hospital Course:  Patient is a pleasant 77 year old female with a past medical history of cognitive impairment, who had been residing at home, having functional decline/failure to thrive, recurrent falls, worsening confusion, admitted to the medicine service on 11/02/2013. Functional decline likely multifactorial in nature with psychotropic medications, dehydration, underlying infectious process all likely contributors. A chest x-ray performed 11/02/2013 showed slight increase in patchy lingular atelectasis or infiltrate. She was started on empiric IV antibiotic with ceftriaxone and azithromycin for possible community acquire pneumonia. During this hospitalization her diuretics were discontinued and she was given gentle IV fluid hydration. Her medication regimen was simplified, minimizing psychotropic medications as much as possible. Workup included a CT scan of brain performed on 11/02/2013 which showed no acute intracranial abnormality. This was further worked up with an MRI of brain which did not reveal evidence of stroke. Labs showed white count within normal range, with a urinalysis on 11/02/2013 unremarkable. She was seen and evaluated by physical therapy who recommended placement to SNF.  I spoke to her daughter reported that he was possible that patient had been taking inappropriate doses of her medications simply because she  forgets that she had already taken them earlier. She has had progression of dementia over the past 6 months. I don't think that she is safe to be home alone given underlying  dementia. During this hospitalization she had some agitation at nighttime, which is likely secondary to sundowning. These concerns were discussed with her daughter. I will recommend minimizing psychotropic medications as much as possible and provide physical therapy.   Patient to be transferred on 11/05/2013 to skilled nursing facility for rehabilitation.   Procedures:  Transthoracic echocardiogram performed on 11/03/2013 depression: Ejection fraction 55-60%, wall motion normal, no regional wall motion abnormalities. Grade 1 diastolic dysfunction.  Consultations:  Physical therapy  Discharge Exam: Filed Vitals:   11/05/13 0645  BP: 103/66  Pulse: 78  Temp: 97.6 F (36.4 C)  Resp: 20    General: Patient is in no acute distress, she is comfortable Cardiovascular: Regular rate rhythm normal S1-S2 Respiratory: Lungs are clear to auscultation bilaterally no wheezing rhonchi or rales Extremities no edema  Discharge Instructions  Discharge Orders   Future Appointments Provider Department Dept Phone   04/23/2014 2:30 PM Adrienne Spaniel, MD [facility] [phone]   Future Orders Complete By Expires   Call MD for:  difficulty breathing, headache or visual disturbances  As directed    Call MD for:  extreme fatigue  As directed    Call MD for:  persistant dizziness or light-headedness  As directed    Call MD for:  persistant nausea and vomiting  As directed    Call MD for:  severe uncontrolled pain  As directed    Call MD for:  temperature >100.4  As directed    Diet - low sodium heart healthy  As directed    Increase activity slowly  As directed        Medication List    STOP taking these medications       furosemide 40 MG tablet  Commonly known as:  LASIX     gabapentin 100 MG capsule  Commonly known as:  NEURONTIN     losartan-hydrochlorothiazide 100-25 MG per tablet  Commonly known as:  HYZAAR     traMADol 50 MG tablet  Commonly known as:  ULTRAM       TAKE these medications       acetaminophen 500 MG tablet  Commonly known as:  TYLENOL  Take 500 mg by mouth every 6 (six) hours as needed for pain.     aspirin EC 81 MG tablet  Take 81 mg by mouth daily.     atorvastatin 20 MG tablet  Commonly known as:  LIPITOR  Take 20 mg by mouth daily before supper.     butalbital-acetaminophen-caffeine 50-325-40 MG per tablet  Commonly known as:  FIORICET, ESGIC  Take 50 tablets by mouth.     CALTRATE 600 PO  Take 1 tablet by mouth 2 (two) times daily.     cefUROXime 500 MG tablet  Commonly known as:  CEFTIN  Take 1 tablet (500 mg total) by mouth 2 (two) times daily with a meal.     DYMISTA 137-50 MCG/ACT Susp  Generic drug:  Azelastine-Fluticasone  Place 137 mcg into alternate nostrils 2 (two) times daily.     losartan 50 MG tablet  Commonly known as:  COZAAR  Take 1 tablet (50 mg total) by mouth daily.     memantine 10 MG tablet  Commonly known as:  NAMENDA  Take 10 mg by mouth 2 (two) times  daily.     metFORMIN 500 MG tablet  Commonly known as:  GLUCOPHAGE  Take 500 mg by mouth 2 (two) times daily.     mirtazapine 15 MG tablet  Commonly known as:  REMERON  Take 7.5 mg by mouth at bedtime. 1/2 TABLET AT BEDTIME     multivitamin with minerals Tabs tablet  Take 1 tablet by mouth daily.     QUEtiapine 12.5 mg Tabs tablet  Commonly known as:  SEROQUEL  Take 0.5 tablets (12.5 mg total) by mouth at bedtime.     sertraline 50 MG tablet  Commonly known as:  ZOLOFT  Take 50 mg by mouth every morning. TAKE 1/2 OF 50 MG TABLET     tobramycin-dexamethasone ophthalmic solution  Commonly known as:  TOBRADEX  Place 2 drops into both eyes 4 (four) times daily.     VESICARE PO  Take 1 tablet by mouth daily.     VITAMIN D PO  Take 2 tablets by mouth daily.       Allergies  Allergen Reactions  . Prednisone Shortness Of Breath  . Ibuprofen Other (See Comments)    Swelling mouth and lips  . Meclizine Other (See  Comments)    HALLUCINATIONS  . Sulfonamide Derivatives     REACTION: Reaction not known       Follow-up Information   Follow up with Adrienne Moh, MD In 1 week.   Specialty:  Internal Medicine   Contact information:   [address] [place] [address] [phone]        The results of significant diagnostics from this hospitalization (including imaging, microbiology, ancillary and laboratory) are listed below for reference.    Significant Diagnostic Studies: Dg Chest 2 View  11/02/2013   CLINICAL DATA:  Cough, congestion.  EXAM: CHEST - 2 VIEW  COMPARISON:  10/06/2013  FINDINGS: Atheromatous aorta. Spurring in the lower thoracic spine. Mild cardiomegaly stable. Some increase in patchy lingular atelectasis or early infiltrate. Left lung clear. No effusion.  IMPRESSION: 1. Slight increase in patchy lingular atelectasis or infiltrate. 2.  Stable mild cardiomegaly.   Electronically Signed   By: Oley Balm M.D.   On: 11/02/2013 18:04   Dg Chest 2 View  10/06/2013   CLINICAL DATA:  Shortness of breath, extremity swelling  EXAM: CHEST  2 VIEW  COMPARISON:  04/18/2012  FINDINGS: Moderate cardiomegaly. Vascular pattern normal. Lungs clear. No pleural effusions.  IMPRESSION: No active cardiopulmonary disease.   Electronically Signed   By: Esperanza Heir M.D.   On: 10/06/2013 22:11   Dg Hip Complete Right  11/02/2013   CLINICAL DATA:  Fall, right hip pain.  EXAM: RIGHT HIP - COMPLETE 2+ VIEW  COMPARISON:  None.  FINDINGS: Mild degenerative changes in the hips bilaterally. No fracture, subluxation or dislocation. Advanced degenerative changes in the visualized lower lumbar spine.  IMPRESSION: No acute bony abnormality.   Electronically Signed   By: Charlett Nose M.D.   On: 11/02/2013 18:04   Ct Head Wo Contrast  11/02/2013   CLINICAL DATA:  Fall.  EXAM: CT HEAD WITHOUT CONTRAST  CT CERVICAL SPINE WITHOUT CONTRAST  TECHNIQUE: Multidetector CT imaging of the  head and cervical spine was performed following the standard protocol without intravenous contrast. Multiplanar CT image reconstructions of the cervical spine were also generated.  COMPARISON:  10/06/2013  FINDINGS: CT HEAD FINDINGS  Ventricles are normal in configuration. There is ventricular and sulcal enlargement reflecting moderate atrophy. This is stable. There  is also stable periventricular white matter hypoattenuation most consistent with chronic microvascular ischemic change. No parenchymal masses or mass effect. No evidence of a cortical infarct.  There are no extra-axial masses. Widened CSF space anterior to left temporal lobe may reflect an arachnoid cyst. It is stable.  There is a left temporal region scalp hematoma that extends to the left lateral periorbital region. No underlying skull fracture.  Minimal dependent fluid in the left maxillary sinus. Remaining sinuses are clear as are the mastoid air cells.  No intracranial hemorrhage.  CT CERVICAL SPINE FINDINGS  No fracture or spondylolisthesis. There are degenerative changes with mild loss of disc height and small endplate osteophytes. There are more prominent facet degenerative changes. These findings are stable. The bones are also demineralized. No evidence of a disc herniation.  Soft tissues show heterogeneous enlargement of the left thyroid gland with calcifications. This is stable. There are stable vascular calcifications.  The lung apices are clear.  IMPRESSION: Head CT: No acute intracranial abnormalities. Left temporal regions scalp hematoma. No skull fracture.  Cervical CT:  No fracture or acute finding.   Electronically Signed   By: Amie Portland M.D.   On: 11/02/2013 17:53   Ct Head Wo Contrast  10/06/2013   CLINICAL DATA:  Weakness, headache  EXAM: CT HEAD WITHOUT CONTRAST  TECHNIQUE: Contiguous axial images were obtained from the base of the skull through the vertex without intravenous contrast.  COMPARISON:  11/18/2012  FINDINGS:  Moderate to severe atrophy. Mild low attenuation in the deep white matter. No hemorrhage or extra-axial fluid. No evidence of vascular territory infarct. Ventricles are prominent proportional to atrophy. Calvarium is intact.  IMPRESSION: Chronic involutional change.  No acute findings.   Electronically Signed   By: Esperanza Heir M.D.   On: 10/06/2013 22:06   Ct Cervical Spine Wo Contrast  11/02/2013   CLINICAL DATA:  Fall.  EXAM: CT HEAD WITHOUT CONTRAST  CT CERVICAL SPINE WITHOUT CONTRAST  TECHNIQUE: Multidetector CT imaging of the head and cervical spine was performed following the standard protocol without intravenous contrast. Multiplanar CT image reconstructions of the cervical spine were also generated.  COMPARISON:  10/06/2013  FINDINGS: CT HEAD FINDINGS  Ventricles are normal in configuration. There is ventricular and sulcal enlargement reflecting moderate atrophy. This is stable. There is also stable periventricular white matter hypoattenuation most consistent with chronic microvascular ischemic change. No parenchymal masses or mass effect. No evidence of a cortical infarct.  There are no extra-axial masses. Widened CSF space anterior to left temporal lobe may reflect an arachnoid cyst. It is stable.  There is a left temporal region scalp hematoma that extends to the left lateral periorbital region. No underlying skull fracture.  Minimal dependent fluid in the left maxillary sinus. Remaining sinuses are clear as are the mastoid air cells.  No intracranial hemorrhage.  CT CERVICAL SPINE FINDINGS  No fracture or spondylolisthesis. There are degenerative changes with mild loss of disc height and small endplate osteophytes. There are more prominent facet degenerative changes. These findings are stable. The bones are also demineralized. No evidence of a disc herniation.  Soft tissues show heterogeneous enlargement of the left thyroid gland with calcifications. This is stable. There are stable vascular  calcifications.  The lung apices are clear.  IMPRESSION: Head CT: No acute intracranial abnormalities. Left temporal regions scalp hematoma. No skull fracture.  Cervical CT:  No fracture or acute finding.   Electronically Signed   By: Renard Hamper.D.  On: 11/02/2013 17:53   Mr Brain Wo Contrast  11/03/2013   CLINICAL DATA:  Frequent falls and syncopal events. Patient found down for 24-48 hr. Possible CVA.  EXAM: MRI HEAD WITHOUT CONTRAST  TECHNIQUE: Multiplanar, multiecho pulse sequences of the brain and surrounding structures were obtained without intravenous contrast.  COMPARISON:  CT head without contrast 11/02/2013.  FINDINGS: No acute or focal intracranial abnormality is present. Moderate generalized atrophy and periventricular white matter disease is advanced for age. The ventricles are proportionate to the degree of atrophy.  Left temporal and parietal scalp soft tissue swelling and edema is evident. There is a focal area of susceptibility within the right temporal lobe, compatible with a punctate remote hemorrhage. There is asymmetric prominence of CSF within the left middle cranial fossa, likely representing an arachnoid cyst. No other significant extra-axial fluid collection is present.  Flow is present in the major intracranial arteries. The globes and orbits are intact. The paranasal side a cyst and right mastoid air cells are clear. There is minimal fluid within the left mastoid air cells. No obstructing nasopharyngeal lesion is evident.  IMPRESSION: 1. No acute intracranial abnormality. 2. Age advanced atrophy and moderate white matter disease. This likely reflects the sequela of chronic microvascular ischemia. 3. Probable arachnoid cyst within the left middle cranial fossa. 4. Minimal left mastoid fluid. No obstructing nasopharyngeal lesion is evident.   Electronically Signed   By: Gennette Pac M.D.   On: 11/03/2013 09:47    Microbiology: Recent Results (from the past 240 hour(s))   CULTURE, BLOOD (ROUTINE X 2)     Status: None   Collection Time    11/02/13 10:40 PM      Result Value Range Status   Specimen Description BLOOD LEFT ANTECUBITAL   Final   Special Requests BOTTLES DRAWN AEROBIC AND ANAEROBIC    Final   Culture  Setup Time     Final   Value: 11/03/2013 00:38     Performed at [facility]   Culture     Final   Value:        BLOOD CULTURE RECEIVED NO GROWTH TO DATE CULTURE WILL BE HELD FOR 5 DAYS BEFORE ISSUING A FINAL NEGATIVE REPORT     Performed at [facility]   Report Status PENDING   Incomplete  CULTURE, BLOOD (ROUTINE X 2)     Status: None   Collection Time    11/02/13 10:45 PM      Result Value Range Status   Specimen Description BLOOD LEFT HAND   Final   Special Requests BOTTLES DRAWN AEROBIC AND ANAEROBIC    Final   Culture  Setup Time     Final   Value: 11/03/2013 00:38     Performed at [facility]   Culture     Final   Value:        BLOOD CULTURE RECEIVED NO GROWTH TO DATE CULTURE WILL BE HELD FOR 5 DAYS BEFORE ISSUING A FINAL NEGATIVE REPORT     Performed at [facility]   Report Status PENDING   Incomplete     Labs: Basic Metabolic Panel:  Recent Labs Lab 11/02/13 1700 11/02/13 1716 11/02/13 2240 11/03/13 0440 11/04/13 0230 11/05/13 0210  NA 142 142  --  141 140 140  K 3.5 3.4*  --  2.9* 3.6 3.9  CL 100 102  --  104 108 108  CO2 25  --   --  26 24 22  GLUCOSE 165* 164*  --  123* 131* 108*  BUN 14 14  --  13 14 11   CREATININE 0.56 0.70 0.61 0.64 0.65 0.55  CALCIUM 9.4  --   --  8.0* 7.9* 8.1*  MG  --   --   --  1.5 1.6 1.7   Liver Function Tests:  Recent Labs Lab 11/03/13 0440 11/04/13 0230 11/05/13 0210  AST 67* 61* 56*  ALT 42* 40* 42*  ALKPHOS 35* 41 39  BILITOT 0.5 0.2* 0.2*  PROT 5.7* 5.5* 6.0  ALBUMIN 2.6* 2.3* 2.6*   No results found for this basename: LIPASE, AMYLASE,  in the last 168 hours No results found for this basename: AMMONIA,  in the last 168  hours CBC:  Recent Labs Lab 11/02/13 1700 11/02/13 1716 11/02/13 2240 11/03/13 0440 11/04/13 0230 11/05/13 0210  WBC 12.8*  --  10.3 8.4 7.1 6.7  NEUTROABS  --   --   --  6.3 4.3 4.0  HGB 14.8 16.0* 12.8 11.5* 11.2* 11.7*  HCT 44.1 47.0* 38.6 34.8* 34.8* 35.5*  MCV 97.1  --  97.7 98.3 99.4 99.7  PLT 240  --  217 230 184 223   Cardiac Enzymes:  Recent Labs Lab 11/04/13 0923 11/04/13 1440 11/04/13 2024 11/05/13 0240 11/05/13 0900  CKTOTAL 380* 358* 301* 252* 220*   BNP: BNP (last 3 results)  Recent Labs  10/06/13 2210 11/02/13 2240  PROBNP 532.5* 1074.0*   CBG:  Recent Labs Lab 11/04/13 0740 11/04/13 1125 11/04/13 1637 11/04/13 2042 11/05/13 0747  GLUCAP 119* 115* 118* 116* 107*       Signed:  Gardy Montanari  [facility] 11/05/2013, 10:18 AM

## 2013-11-09 LAB — CULTURE, BLOOD (ROUTINE X 2): Culture: NO GROWTH

## 2014-03-04 IMAGING — CT CT HEAD W/O CM
2 series · 17 of 30 positions shown, 20 images · non-contrast
Comparison: 11/18/2012

CLINICAL DATA: Weakness, headache

EXAM:
CT HEAD WITHOUT CONTRAST
TECHNIQUE: Contiguous axial images were obtained from the base of the skull
through the vertex without intravenous contrast.

[Series 2: head w/o · axial · non-contrast · 0.41mm/px · z∈[+1151,+1291]mm · 9 of 36 slices shown, 12 images]
[im 4/36  brain]
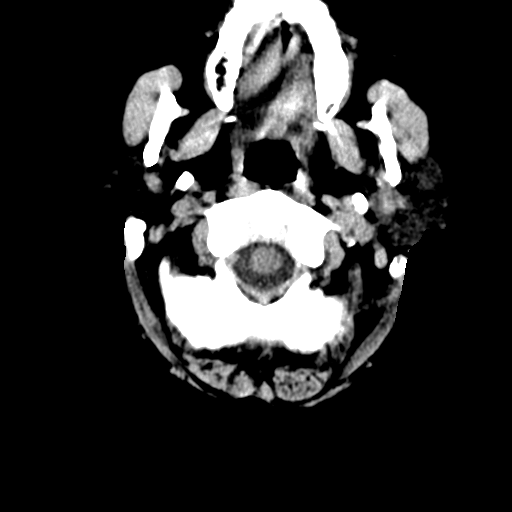
[im 4/36  bone]
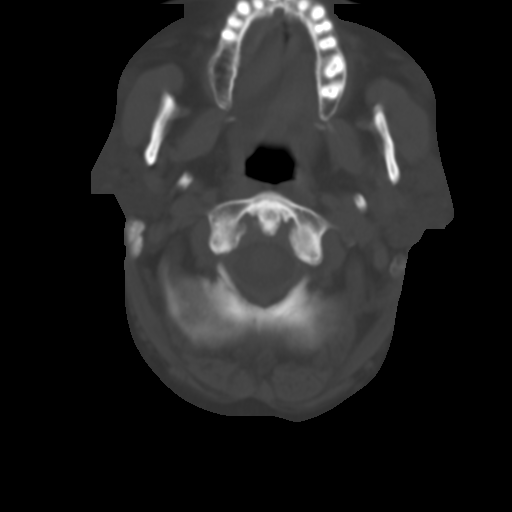
[im 8/36  brain]
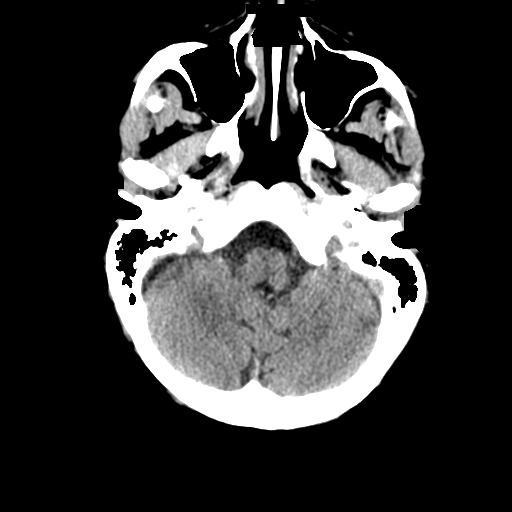
[im 11/36  brain]
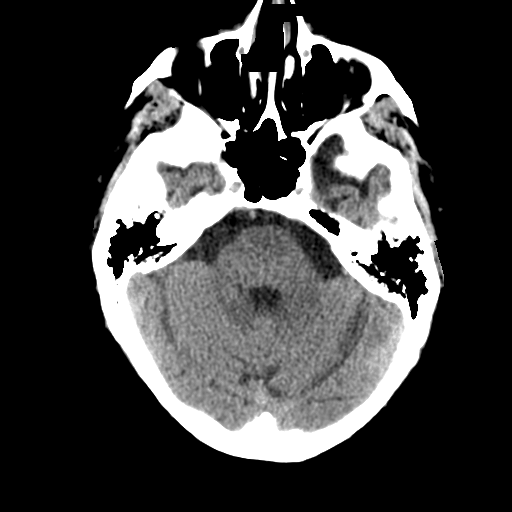
[im 15/36  brain]
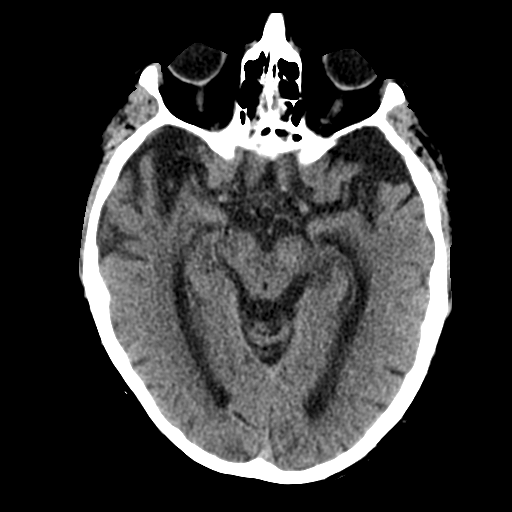
[im 18/36  brain]
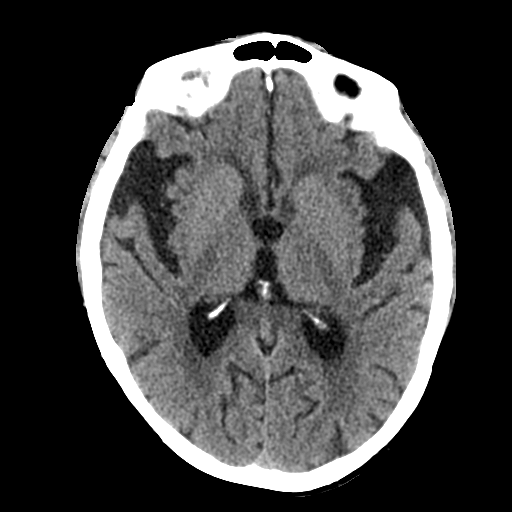
[im 18/36  bone]
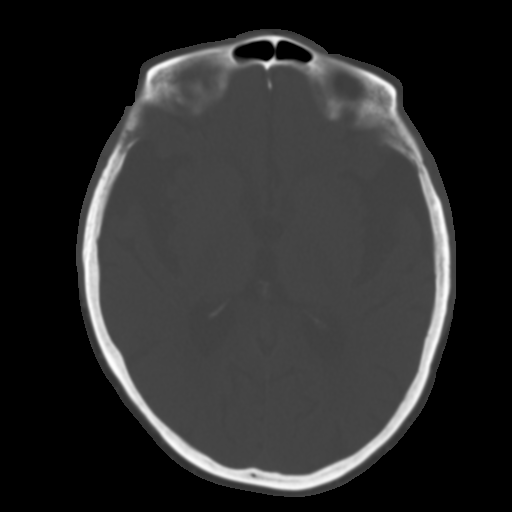
[im 22/36  brain]
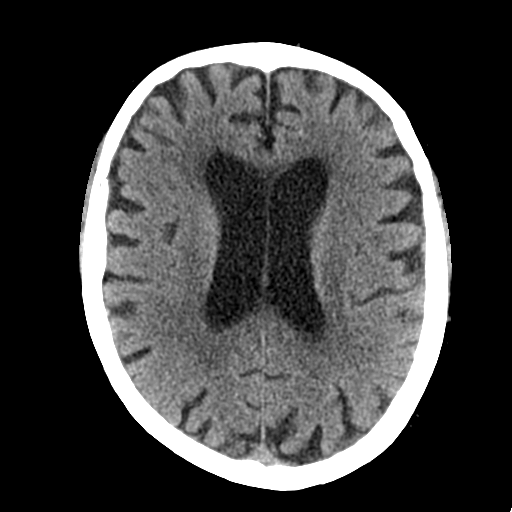
[im 25/36  brain]
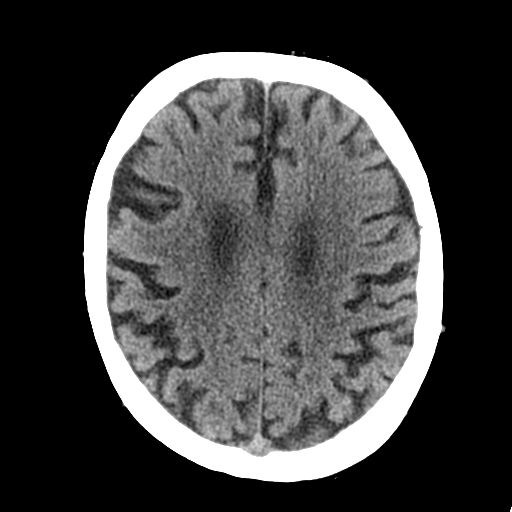
[im 29/36  brain]
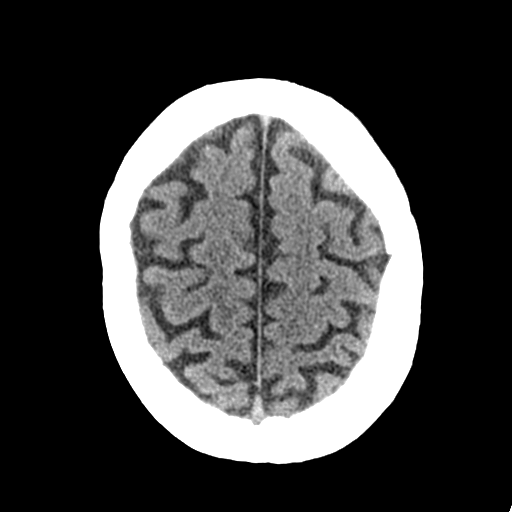
[im 32/36  brain]
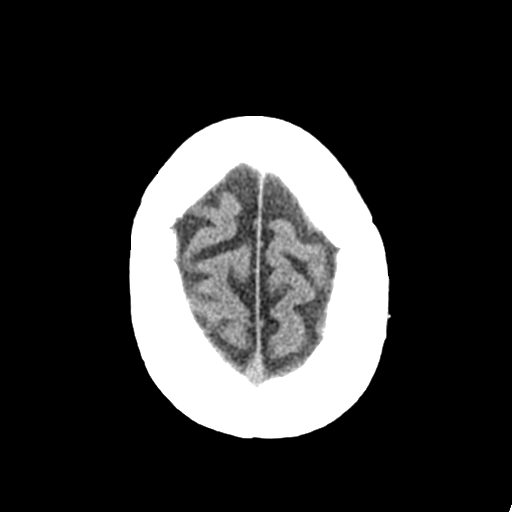
[im 32/36  bone]
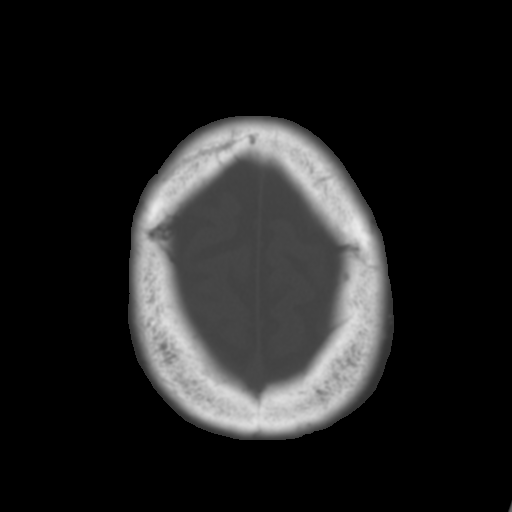

[Series 3: bone windows · axial · 0.41mm/px · z∈[+1154,+1292]mm · 8 of 60 slices shown]
[im 7/60  bone]
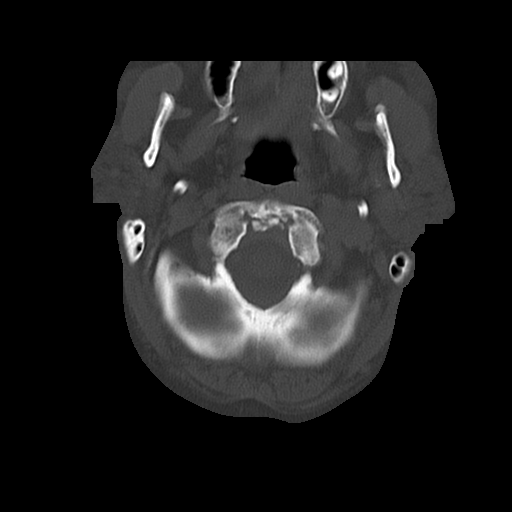
[im 14/60  bone]
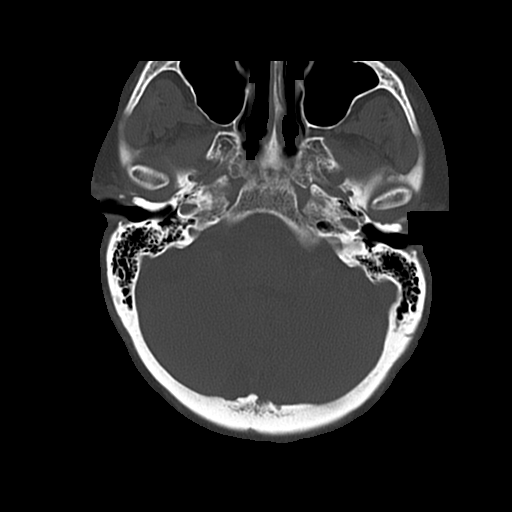
[im 20/60  bone]
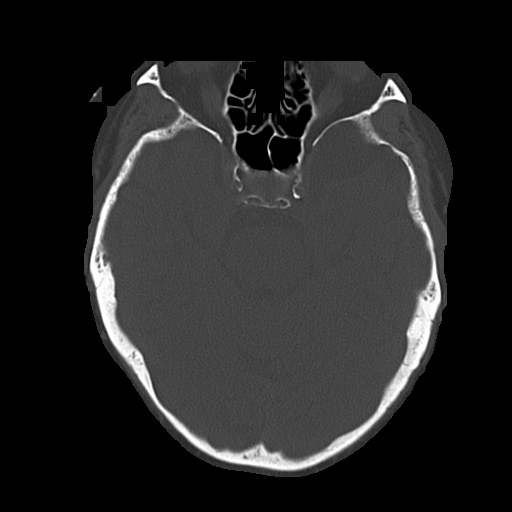
[im 27/60  bone]
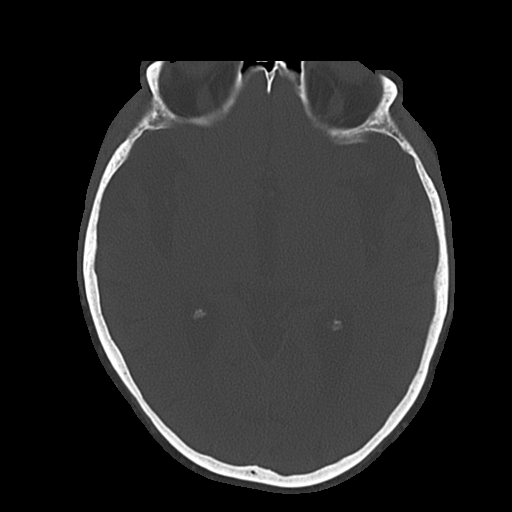
[im 33/60  bone]
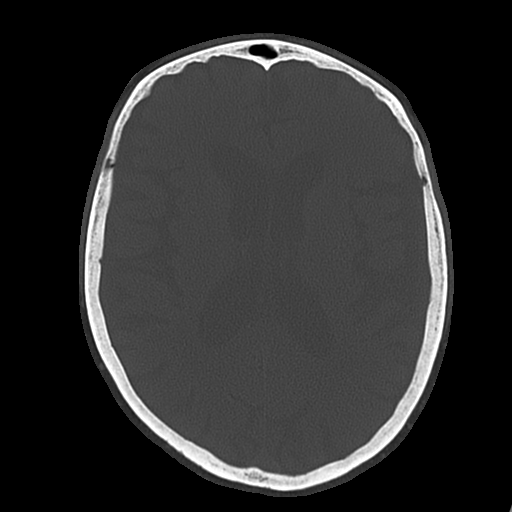
[im 40/60  bone]
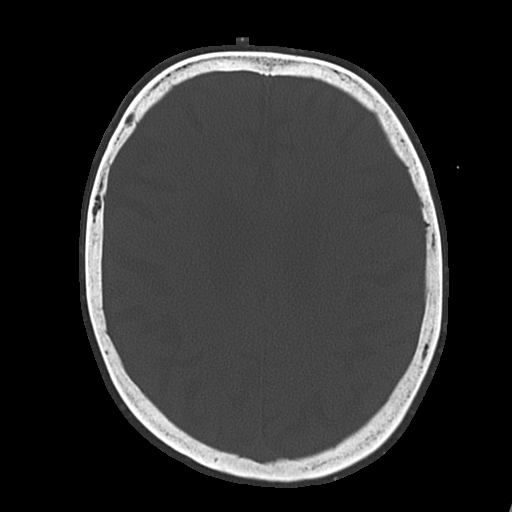
[im 46/60  bone]
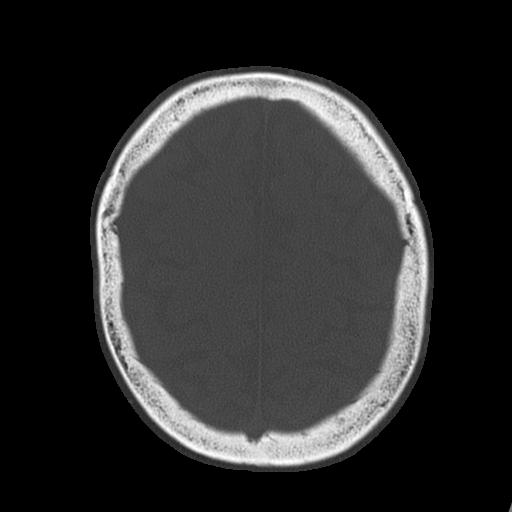
[im 53/60  bone]
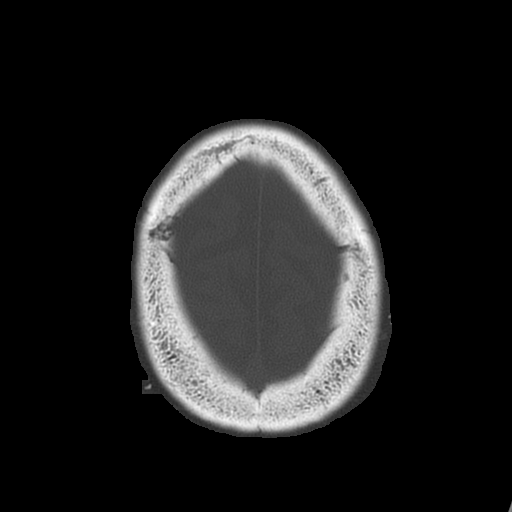

[17 of 30 positions shown; findings below may reference images not displayed]

FINDINGS: Moderate to severe atrophy. Mild low attenuation in the deep white
matter. No hemorrhage or extra-axial fluid. No evidence of vascular
territory infarct. Ventricles are prominent proportional to atrophy.
Calvarium is intact.
IMPRESSION: Chronic involutional change.  No acute findings.

## 2014-04-23 ENCOUNTER — Ambulatory Visit: Payer: Medicare Other | Admitting: Neurology

## 2014-10-02 ENCOUNTER — Encounter: Payer: Self-pay | Admitting: Neurology

## 2014-10-08 ENCOUNTER — Encounter: Payer: Self-pay | Admitting: Neurology

## 2014-12-30 ENCOUNTER — Encounter (HOSPITAL_BASED_OUTPATIENT_CLINIC_OR_DEPARTMENT_OTHER): Payer: Medicare Other | Attending: Plastic Surgery

## 2015-01-16 ENCOUNTER — Encounter (HOSPITAL_BASED_OUTPATIENT_CLINIC_OR_DEPARTMENT_OTHER): Payer: Medicare Other | Attending: Internal Medicine

## 2015-01-16 DIAGNOSIS — L89322 Pressure ulcer of left buttock, stage 2: Secondary | ICD-10-CM | POA: Insufficient documentation

## 2015-01-16 DIAGNOSIS — Z993 Dependence on wheelchair: Secondary | ICD-10-CM | POA: Insufficient documentation

## 2015-01-30 DIAGNOSIS — Z993 Dependence on wheelchair: Secondary | ICD-10-CM | POA: Diagnosis not present

## 2015-01-30 DIAGNOSIS — L89322 Pressure ulcer of left buttock, stage 2: Secondary | ICD-10-CM | POA: Diagnosis not present

## 2015-05-06 ENCOUNTER — Encounter: Payer: Self-pay | Admitting: Podiatry

## 2015-05-06 ENCOUNTER — Ambulatory Visit (INDEPENDENT_AMBULATORY_CARE_PROVIDER_SITE_OTHER): Payer: Medicare Other | Admitting: Podiatry

## 2015-05-06 VITALS — Temp 97.2°F

## 2015-05-06 DIAGNOSIS — L03032 Cellulitis of left toe: Secondary | ICD-10-CM

## 2015-05-06 NOTE — Progress Notes (Signed)
   Subjective:    Patient ID: Adrienne [name], female    DOB: [birth year], 79 y.o.   MRN: [id number]  HPI  N- sharpe L- right great toenail D- 1 month O-gradual C-painful A-shoes, socks T-antibiotic(not on now), helped a little Patient's daughter describes possible oral anabiotic for treatment of painful right hallux nail. Patient's daughter states that mother somewhat confused at times has difficulty understanding She lives in assisted living  Patient presents here today for a B/L toenail trim and right great toenail pain.    Review of Systems  Cardiovascular: Positive for chest pain.  Gastrointestinal: Positive for diarrhea.  Genitourinary: Positive for urgency and frequency.       Increased urination  Musculoskeletal: Positive for back pain and gait problem.       Muscle pain  Neurological: Positive for headaches.  Psychiatric/Behavioral: Positive for confusion.       Objective:   Physical Exam  Patient's daughters in the treatment room today She was able to transfer from wheelchair to treatment chair Patient does seem to be able to respond somewhat with daughter helping her answer questions  Vascular: DP right 2/4, DP left 1/4 PT right 2/4 PT left 1/4 Peripheral edema noted bilaterally  Neurological: Sensation to 10 g monofilament wire intact 5/5 bilaterally Vibratory sensation nonreactive bilaterally Ankle reflexes trace reactive bilaterally  Dermatological: The left hallux nail plate is lifting off the nailbed with dried blood , watery and small purulent drainage released after nail debridement. The underlying nailbed is granular in appearance. There is no surrounding erythema or edema about the left hallux The remaining toenails appear normal trophic and somewhat incurvated including the right hallux nail  Musculoskeletal: Gait disturbance No deformities noted bilaterally    Assessment & Plan:   Assessment: Diminished pedal pulses left Peripheral  edema bilaterally Paronychia left hallux Incurvated toenails 9  Plan: The left hallux nail plate was debrided off the nailbed approximately 2 mm to the posterior nail fold. An anabiotic dressing was applied to the left hallux nail The remaining toenails were debrided without any bleeding  On patient's report of consultation report dated 05/06/2015 Diagnosis paronychia left was indicated Rx cephalexin 500 mg by mouth be 7 days Apply triple anabiotic ointment daily to nail cover with a Band-Aid until healed  Reappoint at patient or patient's daughter request

## 2015-05-06 NOTE — Patient Instructions (Signed)
Rx cephalexin 500 mg by mouth twice a day 7 days Apply topical anabiotic ointment daily to the left great toe and cover with a Band-Aid until healed

## 2015-07-09 ENCOUNTER — Encounter: Payer: Self-pay | Admitting: Podiatry

## 2015-07-09 ENCOUNTER — Ambulatory Visit (INDEPENDENT_AMBULATORY_CARE_PROVIDER_SITE_OTHER): Payer: Medicare Other | Admitting: Podiatry

## 2015-07-09 VITALS — BP 126/65 | HR 64 | Temp 97.7°F | Resp 12

## 2015-07-09 DIAGNOSIS — L03032 Cellulitis of left toe: Secondary | ICD-10-CM

## 2015-07-09 NOTE — Patient Instructions (Signed)
    I prescribed cephalexin 500 mg one twice a day 7 days Remove bandage in 24 hours and apply topical antibiotic ointment and light gauze dressing or Band-Aid daily until wound site heels Wear soft shoes are soft sock to avoid compression to toe If patient develops any sudden fever, swelling, pain present to ER  ANTIBACTERIAL SOAP INSTRUCTIONS  THE DAY AFTER PROCEDURE  Please follow the instructions your doctor has marked.   Shower as usual. Before getting out, place a drop of antibacterial liquid soap (Dial) on a wet, clean washcloth.  Gently wipe washcloth over affected area.  Afterward, rinse the area with warm water.  Blot the area dry with a soft cloth and cover with antibiotic ointment (neosporin, polysporin, bacitracin) and band aid or gauze and tape  Place 3-4 drops of antibacterial liquid soap in a quart of warm tap water.  Submerge foot into water for 20 minutes.  If bandage was applied after your procedure, leave on to allow for easy lift off, then remove and continue with soak for the remaining time.  Next, blot area dry with a soft cloth and cover with a bandage.  Apply other medications as directed by your doctor, such as cortisporin otic solution (eardrops) or neosporin antibiotic ointment

## 2015-07-09 NOTE — Progress Notes (Signed)
   Subjective:    Patient ID: Adrienne [name], female    DOB: [birth year], 79 y.o.   MRN: [id number]  HPI   This patient presents again with her daughter present complaining of pressure and discomfort in the left great toenail area for approximately 2 months. She has a history of a paronychia in this same toenail which was treated on 05/06/2015 with debridement of the nail am prescribing cephalexin 500 mg twice a day 7 days. Apparently the symptoms temporarily improved, however, the symptoms have recurred  Review of Systems  Musculoskeletal: Positive for gait problem.  Skin: Positive for color change.       Objective:   Physical Exam  Pleasant somewhat confused patient presents with her daughter in the treatment room  Vascular: DP and PT pulses 1/4 bilaterally Capillary fill is immediate bilaterally  Neurological: Deferred  Dermatological: The left hallux toenail is incurvated with crusting and granulation tissue and crusting along the medial lateral borders. The  erythema extending from the distal toe to the interphalangeal joint with some serous drainage noted. The medial lateral margins are tender to palpation.      Assessment & Plan:   Assessment: Diabetic with decreased pedal pulses Paronychia ingrowing medial lateral borders of left hallux toenail  Plan: I recommended I&D of the medial lateral borders under local. With daughter present patient verbally consents. The left hallux was  with 3 mL of 50-50 mixture of 2% plain Xylocaine and 0.5% plain Marcaine. Toe is prepped with Betadine and exsanguinated. The medial lateral borders the left hallux toenail were excised revealing large incurvated nail fragments 2. No active drainage was released. An antibiotic dressing was applied. The tourniquet was released and spontaneous capillary fill time was noted left hallux  Patient tolerated procedure without any difficulty Written instructions provided at patient's report of  consultation for application of triple antibiotic ointment and Band-Aid starting in 24 hours Rx cephalexin 500 mg by mouth twice a day 7 days Also in the after visit summary antibiotic Terry L soft soaks were recommended  Reappoint if symptoms do not improve after completion of antibiotics

## 2015-10-08 ENCOUNTER — Ambulatory Visit (INDEPENDENT_AMBULATORY_CARE_PROVIDER_SITE_OTHER): Payer: Medicare Other | Admitting: Podiatry

## 2015-10-08 ENCOUNTER — Encounter: Payer: Self-pay | Admitting: Podiatry

## 2015-10-08 VITALS — BP 128/60 | HR 97 | Resp 97

## 2015-10-08 DIAGNOSIS — B351 Tinea unguium: Secondary | ICD-10-CM | POA: Diagnosis not present

## 2015-10-08 NOTE — Patient Instructions (Signed)
Diabetes and Foot Care Diabetes may cause you to have problems because of poor blood supply (circulation) to your feet and legs. This may cause the skin on your feet to become thinner, break easier, and heal more slowly. Your skin may become dry, and the skin may peel and crack. You may also have nerve damage in your legs and feet causing decreased feeling in them. You may not notice minor injuries to your feet that could lead to infections or more serious problems. Taking care of your feet is one of the most important things you can do for yourself.  HOME CARE INSTRUCTIONS  Wear shoes at all times, even in the house. Do not go barefoot. Bare feet are easily injured.  Check your feet daily for blisters, cuts, and redness. If you cannot see the bottom of your feet, use a mirror or ask someone for help.  Wash your feet with warm water (do not use hot water) and mild soap. Then pat your feet and the areas between your toes until they are completely dry. Do not soak your feet as this can dry your skin.  Apply a moisturizing lotion or petroleum jelly (that does not contain alcohol and is unscented) to the skin on your feet and to dry, brittle toenails. Do not apply lotion between your toes.  Trim your toenails straight across. Do not dig under them or around the cuticle. File the edges of your nails with an emery board or nail file.  Do not cut corns or calluses or try to remove them with medicine.  Wear clean socks or stockings every day. Make sure they are not too tight. Do not wear knee-high stockings since they may decrease blood flow to your legs.  Wear shoes that fit properly and have enough cushioning. To break in new shoes, wear them for just a few hours a day. This prevents you from injuring your feet. Always look in your shoes before you put them on to be sure there are no objects inside.  Do not cross your legs. This may decrease the blood flow to your feet.  If you find a minor scrape,  cut, or break in the skin on your feet, keep it and the skin around it clean and dry. These areas may be cleansed with mild soap and water. Do not cleanse the area with peroxide, alcohol, or iodine.  When you remove an adhesive bandage, be sure not to damage the skin around it.  If you have a wound, look at it several times a day to make sure it is healing.  Do not use heating pads or hot water bottles. They may burn your skin. If you have lost feeling in your feet or legs, you may not know it is happening until it is too late.  Make sure your health care provider performs a complete foot exam at least annually or more often if you have foot problems. Report any cuts, sores, or bruises to your health care provider immediately. SEEK MEDICAL CARE IF:   You have an injury that is not healing.  You have cuts or breaks in the skin.  You have an ingrown nail.  You notice redness on your legs or feet.  You feel burning or tingling in your legs or feet.  You have pain or cramps in your legs and feet.  Your legs or feet are numb.  Your feet always feel cold. SEEK IMMEDIATE MEDICAL CARE IF:   There is increasing redness,   swelling, or pain in or around a wound.  There is a red line that goes up your leg.  Pus is coming from a wound.  You develop a fever or as directed by your health care provider.  You notice a bad smell coming from an ulcer or wound.   This information is not intended to replace advice given to you by your health care provider. Make sure you discuss any questions you have with your health care provider.   Document Released: 10/29/2000 Document Revised: 07/04/2013 Document Reviewed: 04/10/2013 Elsevier Interactive Patient Education 2016 [employer]  

## 2015-10-09 NOTE — Progress Notes (Signed)
Patient ID: Adrienne [name], female   DOB: Jul 04, 1936, 79 y.o.   MRN: [id number]  Subjective: This patient presents today complaining of uncomfortable hallux nail margins with direct shoe pressure and walking  Objective: Patient's daughter present treatment room Patient appears responsive questioning The hallux toenails have some texture and color changes with incurvated medial lateral borders, without any erythema, edema or drainage noted bilaterally  Assessment: Incurvated mycotic toenails 2  Plan: Debrided hallux nails 2 mechanically electrically without any bleeding  Reappoint at patient's request

## 2016-12-08 ENCOUNTER — Emergency Department (HOSPITAL_COMMUNITY): Payer: Medicare Other

## 2016-12-08 ENCOUNTER — Inpatient Hospital Stay (HOSPITAL_COMMUNITY): Payer: Medicare Other

## 2016-12-08 ENCOUNTER — Inpatient Hospital Stay (HOSPITAL_COMMUNITY)
Admission: EM | Admit: 2016-12-08 | Discharge: 2017-01-02 | DRG: 353 | Disposition: A | Discharge status: Skilled Nursing Facility | Payer: Medicare Other | Attending: Nephrology | Admitting: Nephrology

## 2016-12-08 ENCOUNTER — Encounter (HOSPITAL_COMMUNITY): Payer: Self-pay | Admitting: Emergency Medicine

## 2016-12-08 DIAGNOSIS — K562 Volvulus: Secondary | ICD-10-CM | POA: Diagnosis not present

## 2016-12-08 DIAGNOSIS — E7800 Pure hypercholesterolemia, unspecified: Secondary | ICD-10-CM | POA: Diagnosis present

## 2016-12-08 DIAGNOSIS — R7989 Other specified abnormal findings of blood chemistry: Secondary | ICD-10-CM | POA: Diagnosis not present

## 2016-12-08 DIAGNOSIS — R140 Abdominal distension (gaseous): Secondary | ICD-10-CM | POA: Diagnosis present

## 2016-12-08 DIAGNOSIS — K567 Ileus, unspecified: Secondary | ICD-10-CM | POA: Diagnosis not present

## 2016-12-08 DIAGNOSIS — D62 Acute posthemorrhagic anemia: Secondary | ICD-10-CM | POA: Diagnosis not present

## 2016-12-08 DIAGNOSIS — K436 Other and unspecified ventral hernia with obstruction, without gangrene: Principal | ICD-10-CM | POA: Diagnosis present

## 2016-12-08 DIAGNOSIS — E785 Hyperlipidemia, unspecified: Secondary | ICD-10-CM | POA: Diagnosis present

## 2016-12-08 DIAGNOSIS — N179 Acute kidney failure, unspecified: Secondary | ICD-10-CM | POA: Diagnosis present

## 2016-12-08 DIAGNOSIS — Z9289 Personal history of other medical treatment: Secondary | ICD-10-CM

## 2016-12-08 DIAGNOSIS — M8580 Other specified disorders of bone density and structure, unspecified site: Secondary | ICD-10-CM | POA: Diagnosis present

## 2016-12-08 DIAGNOSIS — E039 Hypothyroidism, unspecified: Secondary | ICD-10-CM | POA: Diagnosis present

## 2016-12-08 DIAGNOSIS — Z96653 Presence of artificial knee joint, bilateral: Secondary | ICD-10-CM | POA: Diagnosis present

## 2016-12-08 DIAGNOSIS — Z803 Family history of malignant neoplasm of breast: Secondary | ICD-10-CM

## 2016-12-08 DIAGNOSIS — J9601 Acute respiratory failure with hypoxia: Secondary | ICD-10-CM | POA: Diagnosis not present

## 2016-12-08 DIAGNOSIS — R1311 Dysphagia, oral phase: Secondary | ICD-10-CM

## 2016-12-08 DIAGNOSIS — K56609 Unspecified intestinal obstruction, unspecified as to partial versus complete obstruction: Secondary | ICD-10-CM | POA: Diagnosis not present

## 2016-12-08 DIAGNOSIS — Z800 Family history of malignant neoplasm of digestive organs: Secondary | ICD-10-CM

## 2016-12-08 DIAGNOSIS — R188 Other ascites: Secondary | ICD-10-CM | POA: Diagnosis not present

## 2016-12-08 DIAGNOSIS — E872 Acidosis, unspecified: Secondary | ICD-10-CM

## 2016-12-08 DIAGNOSIS — Z515 Encounter for palliative care: Secondary | ICD-10-CM

## 2016-12-08 DIAGNOSIS — E876 Hypokalemia: Secondary | ICD-10-CM | POA: Diagnosis not present

## 2016-12-08 DIAGNOSIS — K9189 Other postprocedural complications and disorders of digestive system: Secondary | ICD-10-CM | POA: Diagnosis not present

## 2016-12-08 DIAGNOSIS — J690 Pneumonitis due to inhalation of food and vomit: Secondary | ICD-10-CM | POA: Diagnosis not present

## 2016-12-08 DIAGNOSIS — Z7982 Long term (current) use of aspirin: Secondary | ICD-10-CM

## 2016-12-08 DIAGNOSIS — R109 Unspecified abdominal pain: Secondary | ICD-10-CM

## 2016-12-08 DIAGNOSIS — D72829 Elevated white blood cell count, unspecified: Secondary | ICD-10-CM

## 2016-12-08 DIAGNOSIS — G4733 Obstructive sleep apnea (adult) (pediatric): Secondary | ICD-10-CM | POA: Diagnosis present

## 2016-12-08 DIAGNOSIS — J9600 Acute respiratory failure, unspecified whether with hypoxia or hypercapnia: Secondary | ICD-10-CM | POA: Diagnosis not present

## 2016-12-08 DIAGNOSIS — Z6838 Body mass index (BMI) 38.0-38.9, adult: Secondary | ICD-10-CM

## 2016-12-08 DIAGNOSIS — F418 Other specified anxiety disorders: Secondary | ICD-10-CM | POA: Diagnosis present

## 2016-12-08 DIAGNOSIS — Z8249 Family history of ischemic heart disease and other diseases of the circulatory system: Secondary | ICD-10-CM

## 2016-12-08 DIAGNOSIS — Z7984 Long term (current) use of oral hypoglycemic drugs: Secondary | ICD-10-CM

## 2016-12-08 DIAGNOSIS — E874 Mixed disorder of acid-base balance: Secondary | ICD-10-CM | POA: Diagnosis present

## 2016-12-08 DIAGNOSIS — Z9911 Dependence on respirator [ventilator] status: Secondary | ICD-10-CM | POA: Diagnosis not present

## 2016-12-08 DIAGNOSIS — Z7189 Other specified counseling: Secondary | ICD-10-CM

## 2016-12-08 DIAGNOSIS — E46 Unspecified protein-calorie malnutrition: Secondary | ICD-10-CM | POA: Diagnosis present

## 2016-12-08 DIAGNOSIS — Z0189 Encounter for other specified special examinations: Secondary | ICD-10-CM

## 2016-12-08 DIAGNOSIS — F0390 Unspecified dementia without behavioral disturbance: Secondary | ICD-10-CM | POA: Diagnosis present

## 2016-12-08 DIAGNOSIS — E669 Obesity, unspecified: Secondary | ICD-10-CM | POA: Diagnosis present

## 2016-12-08 DIAGNOSIS — E1165 Type 2 diabetes mellitus with hyperglycemia: Secondary | ICD-10-CM | POA: Diagnosis present

## 2016-12-08 DIAGNOSIS — R0989 Other specified symptoms and signs involving the circulatory and respiratory systems: Secondary | ICD-10-CM

## 2016-12-08 DIAGNOSIS — Z4659 Encounter for fitting and adjustment of other gastrointestinal appliance and device: Secondary | ICD-10-CM

## 2016-12-08 DIAGNOSIS — L8990 Pressure ulcer of unspecified site, unspecified stage: Secondary | ICD-10-CM | POA: Insufficient documentation

## 2016-12-08 DIAGNOSIS — R112 Nausea with vomiting, unspecified: Secondary | ICD-10-CM

## 2016-12-08 DIAGNOSIS — I10 Essential (primary) hypertension: Secondary | ICD-10-CM | POA: Diagnosis present

## 2016-12-08 DIAGNOSIS — J9621 Acute and chronic respiratory failure with hypoxia: Secondary | ICD-10-CM | POA: Diagnosis present

## 2016-12-08 DIAGNOSIS — Z90710 Acquired absence of both cervix and uterus: Secondary | ICD-10-CM

## 2016-12-08 DIAGNOSIS — R197 Diarrhea, unspecified: Secondary | ICD-10-CM

## 2016-12-08 DIAGNOSIS — D509 Iron deficiency anemia, unspecified: Secondary | ICD-10-CM | POA: Diagnosis present

## 2016-12-08 LAB — BLOOD GAS, ARTERIAL
ACID-BASE EXCESS: 0.4 mmol/L (ref 0.0–2.0)
BICARBONATE: 23.9 mmol/L (ref 20.0–28.0)
Drawn by: 270221
FIO2: 100
LHR: 30 {breaths}/min
O2 SAT: 99.6 %
PCO2 ART: 35 mmHg (ref 32.0–48.0)
PEEP/CPAP: 10 cmH2O
PH ART: 7.451 — AB (ref 7.350–7.450)
Patient temperature: 98.9
VT: 450 mL
pO2, Arterial: 239 mmHg — ABNORMAL HIGH (ref 83.0–108.0)

## 2016-12-08 LAB — COMPREHENSIVE METABOLIC PANEL
ALK PHOS: 38 U/L (ref 38–126)
ALK PHOS: 28 U/L — AB (ref 38–126)
ALT: 26 U/L (ref 14–54)
ALT: 22 U/L (ref 14–54)
ANION GAP: 27 — AB (ref 5–15)
AST: 44 U/L — ABNORMAL HIGH (ref 15–41)
AST: 37 U/L (ref 15–41)
Albumin: 3.5 g/dL (ref 3.5–5.0)
Albumin: 2.5 g/dL — ABNORMAL LOW (ref 3.5–5.0)
Anion gap: 12 (ref 5–15)
BILIRUBIN TOTAL: 2.1 mg/dL — AB (ref 0.3–1.2)
BILIRUBIN TOTAL: 1.3 mg/dL — AB (ref 0.3–1.2)
BUN: 48 mg/dL — ABNORMAL HIGH (ref 6–20)
BUN: 50 mg/dL — AB (ref 6–20)
CALCIUM: 9 mg/dL (ref 8.9–10.3)
CALCIUM: 7.8 mg/dL — AB (ref 8.9–10.3)
CO2: 12 mmol/L — ABNORMAL LOW (ref 22–32)
CO2: 24 mmol/L (ref 22–32)
CREATININE: 1.76 mg/dL — AB (ref 0.44–1.00)
Chloride: 99 mmol/L — ABNORMAL LOW (ref 101–111)
Chloride: 105 mmol/L (ref 101–111)
Creatinine, Ser: 2.01 mg/dL — ABNORMAL HIGH (ref 0.44–1.00)
GFR, EST AFRICAN AMERICAN: 26 mL/min — AB (ref >60)
GFR, EST AFRICAN AMERICAN: 31 mL/min — AB (ref >60)
GFR, EST NON AFRICAN AMERICAN: 23 mL/min — AB (ref >60)
GFR, EST NON AFRICAN AMERICAN: 27 mL/min — AB (ref >60)
Glucose, Bld: 323 mg/dL — ABNORMAL HIGH (ref 65–99)
Glucose, Bld: 130 mg/dL — ABNORMAL HIGH (ref 65–99)
Potassium: 3.8 mmol/L (ref 3.5–5.1)
Potassium: 3.8 mmol/L (ref 3.5–5.1)
Sodium: 138 mmol/L (ref 135–145)
Sodium: 141 mmol/L (ref 135–145)
TOTAL PROTEIN: 7.7 g/dL (ref 6.5–8.1)
TOTAL PROTEIN: 5.9 g/dL — AB (ref 6.5–8.1)

## 2016-12-08 LAB — I-STAT ARTERIAL BLOOD GAS, ED
ACID-BASE DEFICIT: 11 mmol/L — AB (ref 0.0–2.0)
Acid-base deficit: 8 mmol/L — ABNORMAL HIGH (ref 0.0–2.0)
BICARBONATE: 13.9 mmol/L — AB (ref 20.0–28.0)
BICARBONATE: 15.8 mmol/L — AB (ref 20.0–28.0)
O2 SAT: 98 %
O2 Saturation: 98 %
PCO2 ART: 26.7 mmHg — AB (ref 32.0–48.0)
PCO2 ART: 25.9 mmHg — AB (ref 32.0–48.0)
PO2 ART: 108 mmHg (ref 83.0–108.0)
Patient temperature: 98.2
TCO2: 15 mmol/L (ref 0–100)
TCO2: 17 mmol/L (ref 0–100)
pH, Arterial: 7.321 — ABNORMAL LOW (ref 7.350–7.450)
pH, Arterial: 7.393 (ref 7.350–7.450)
pO2, Arterial: 102 mmHg (ref 83.0–108.0)

## 2016-12-08 LAB — I-STAT CHEM 8, ED
BUN: 48 mg/dL — ABNORMAL HIGH (ref 6–20)
CREATININE: 1.8 mg/dL — AB (ref 0.44–1.00)
Calcium, Ion: 1 mmol/L — ABNORMAL LOW (ref 1.15–1.40)
Chloride: 104 mmol/L (ref 101–111)
GLUCOSE: 365 mg/dL — AB (ref 65–99)
HCT: 45 % (ref 36.0–46.0)
HEMOGLOBIN: 15.3 g/dL — AB (ref 12.0–15.0)
POTASSIUM: 4.1 mmol/L (ref 3.5–5.1)
Sodium: 139 mmol/L (ref 135–145)
TCO2: 15 mmol/L (ref 0–100)

## 2016-12-08 LAB — HEPATIC FUNCTION PANEL
ALK PHOS: 31 U/L — AB (ref 38–126)
ALT: 24 U/L (ref 14–54)
AST: 39 U/L (ref 15–41)
Albumin: 2.8 g/dL — ABNORMAL LOW (ref 3.5–5.0)
BILIRUBIN DIRECT: 0.5 mg/dL (ref 0.1–0.5)
BILIRUBIN INDIRECT: 1.3 mg/dL — AB (ref 0.3–0.9)
Total Bilirubin: 1.8 mg/dL — ABNORMAL HIGH (ref 0.3–1.2)
Total Protein: 6.9 g/dL (ref 6.5–8.1)

## 2016-12-08 LAB — CBC WITH DIFFERENTIAL/PLATELET
BASOS ABS: 0 10*3/uL (ref 0.0–0.1)
BASOS PCT: 0 %
EOS ABS: 0 10*3/uL (ref 0.0–0.7)
Eosinophils Relative: 0 %
HEMATOCRIT: 45.5 % (ref 36.0–46.0)
HEMOGLOBIN: 14.9 g/dL (ref 12.0–15.0)
Lymphocytes Relative: 13 %
Lymphs Abs: 1.5 10*3/uL (ref 0.7–4.0)
MCH: 32 pg (ref 26.0–34.0)
MCHC: 32.7 g/dL (ref 30.0–36.0)
MCV: 97.6 fL (ref 78.0–100.0)
Monocytes Absolute: 1 10*3/uL (ref 0.1–1.0)
Monocytes Relative: 9 %
NEUTROS ABS: 8.3 10*3/uL — AB (ref 1.7–7.7)
NEUTROS PCT: 78 %
Platelets: 229 10*3/uL (ref 150–400)
RBC: 4.66 MIL/uL (ref 3.87–5.11)
RDW: 14.9 % (ref 11.5–15.5)
WBC: 10.8 10*3/uL — ABNORMAL HIGH (ref 4.0–10.5)

## 2016-12-08 LAB — CBG MONITORING, ED
GLUCOSE-CAPILLARY: 295 mg/dL — AB (ref 65–99)
Glucose-Capillary: 341 mg/dL — ABNORMAL HIGH (ref 65–99)
Glucose-Capillary: 336 mg/dL — ABNORMAL HIGH (ref 65–99)
Glucose-Capillary: 350 mg/dL — ABNORMAL HIGH (ref 65–99)
Glucose-Capillary: 320 mg/dL — ABNORMAL HIGH (ref 65–99)

## 2016-12-08 LAB — GLUCOSE, CAPILLARY
GLUCOSE-CAPILLARY: 134 mg/dL — AB (ref 65–99)
GLUCOSE-CAPILLARY: 145 mg/dL — AB (ref 65–99)
GLUCOSE-CAPILLARY: 146 mg/dL — AB (ref 65–99)
GLUCOSE-CAPILLARY: 148 mg/dL — AB (ref 65–99)
GLUCOSE-CAPILLARY: 153 mg/dL — AB (ref 65–99)
Glucose-Capillary: 134 mg/dL — ABNORMAL HIGH (ref 65–99)
Glucose-Capillary: 141 mg/dL — ABNORMAL HIGH (ref 65–99)
Glucose-Capillary: 176 mg/dL — ABNORMAL HIGH (ref 65–99)
Glucose-Capillary: 182 mg/dL — ABNORMAL HIGH (ref 65–99)
Glucose-Capillary: 208 mg/dL — ABNORMAL HIGH (ref 65–99)
Glucose-Capillary: 273 mg/dL — ABNORMAL HIGH (ref 65–99)

## 2016-12-08 LAB — I-STAT TROPONIN, ED: TROPONIN I, POC: 0.02 ng/mL (ref 0.00–0.08)

## 2016-12-08 LAB — AMYLASE: Amylase: 76 U/L (ref 28–100)

## 2016-12-08 LAB — APTT: aPTT: 32 seconds (ref 24–36)

## 2016-12-08 LAB — URINALYSIS, ROUTINE W REFLEX MICROSCOPIC
Bilirubin Urine: NEGATIVE
Glucose, UA: 50 mg/dL — AB
Hgb urine dipstick: NEGATIVE
KETONES UR: NEGATIVE mg/dL
LEUKOCYTES UA: NEGATIVE
NITRITE: NEGATIVE
PH: 5 (ref 5.0–8.0)
Protein, ur: NEGATIVE mg/dL
SPECIFIC GRAVITY, URINE: 1.021 (ref 1.005–1.030)

## 2016-12-08 LAB — PROTIME-INR
INR: 1.45
PROTHROMBIN TIME: 17.8 s — AB (ref 11.4–15.2)

## 2016-12-08 LAB — LACTIC ACID, PLASMA
LACTIC ACID, VENOUS: 4.2 mmol/L — AB (ref 0.5–1.9)
Lactic Acid, Venous: 4.3 mmol/L (ref 0.5–1.9)

## 2016-12-08 LAB — MRSA PCR SCREENING: MRSA BY PCR: NEGATIVE

## 2016-12-08 LAB — BRAIN NATRIURETIC PEPTIDE: B NATRIURETIC PEPTIDE 5: 47.5 pg/mL (ref 0.0–100.0)

## 2016-12-08 LAB — I-STAT CG4 LACTIC ACID, ED
LACTIC ACID, VENOUS: 10.27 mmol/L — AB (ref 0.5–1.9)
Lactic Acid, Venous: 11.32 mmol/L (ref 0.5–1.9)
Lactic Acid, Venous: 8.17 mmol/L (ref 0.5–1.9)

## 2016-12-08 LAB — LACTATE DEHYDROGENASE: LDH: 163 U/L (ref 98–192)

## 2016-12-08 LAB — LIPASE, BLOOD: Lipase: 61 U/L — ABNORMAL HIGH (ref 11–51)

## 2016-12-08 LAB — MAGNESIUM: Magnesium: 1.7 mg/dL (ref 1.7–2.4)

## 2016-12-08 MED ORDER — SODIUM CHLORIDE 0.9 % IV SOLN
10.0000 mg | Freq: Two times a day (BID) | INTRAVENOUS | Status: DC
  Administered 2016-12-08 – 2016-12-14 (×13): 10 mg via INTRAVENOUS
  Filled 2016-12-08 (×23): qty 1

## 2016-12-08 MED ORDER — SODIUM CHLORIDE 0.9 % IV SOLN
25.0000 ug/h | INTRAVENOUS | Status: DC
  Administered 2016-12-08: 25 ug/h via INTRAVENOUS
  Administered 2016-12-09: 50 ug/h via INTRAVENOUS
  Filled 2016-12-08 (×2): qty 50

## 2016-12-08 MED ORDER — ONDANSETRON HCL 4 MG/2ML IJ SOLN
4.0000 mg | Freq: Four times a day (QID) | INTRAMUSCULAR | Status: DC | PRN
  Administered 2016-12-14 – 2016-12-22 (×4): 4 mg via INTRAVENOUS
  Filled 2016-12-08 (×4): qty 2

## 2016-12-08 MED ORDER — ORAL CARE MOUTH RINSE
15.0000 mL | OROMUCOSAL | Status: DC
  Administered 2016-12-08 – 2016-12-11 (×31): 15 mL via OROMUCOSAL

## 2016-12-08 MED ORDER — INSULIN ASPART 100 UNIT/ML ~~LOC~~ SOLN
0.0000 [IU] | SUBCUTANEOUS | Status: DC
  Administered 2016-12-08: 2 [IU] via SUBCUTANEOUS
  Administered 2016-12-09: 3 [IU] via SUBCUTANEOUS
  Administered 2016-12-09 (×2): 2 [IU] via SUBCUTANEOUS
  Administered 2016-12-09: 3 [IU] via SUBCUTANEOUS
  Administered 2016-12-09 – 2016-12-10 (×3): 2 [IU] via SUBCUTANEOUS
  Administered 2016-12-10: 1 [IU] via SUBCUTANEOUS
  Administered 2016-12-10 (×2): 2 [IU] via SUBCUTANEOUS
  Administered 2016-12-10 – 2016-12-11 (×4): 1 [IU] via SUBCUTANEOUS
  Administered 2016-12-11: 2 [IU] via SUBCUTANEOUS
  Administered 2016-12-11 (×2): 1 [IU] via SUBCUTANEOUS
  Administered 2016-12-12: 2 [IU] via SUBCUTANEOUS
  Administered 2016-12-12 – 2016-12-14 (×8): 1 [IU] via SUBCUTANEOUS
  Administered 2016-12-14 – 2016-12-15 (×2): 2 [IU] via SUBCUTANEOUS
  Administered 2016-12-15: 1 [IU] via SUBCUTANEOUS
  Administered 2016-12-15 (×4): 2 [IU] via SUBCUTANEOUS
  Administered 2016-12-16: 1 [IU] via SUBCUTANEOUS
  Administered 2016-12-16 (×2): 2 [IU] via SUBCUTANEOUS
  Administered 2016-12-16 – 2016-12-17 (×2): 1 [IU] via SUBCUTANEOUS
  Administered 2016-12-17: 2 [IU] via SUBCUTANEOUS
  Administered 2016-12-17 – 2016-12-18 (×2): 1 [IU] via SUBCUTANEOUS
  Administered 2016-12-18: 2 [IU] via SUBCUTANEOUS
  Administered 2016-12-19 – 2016-12-20 (×3): 1 [IU] via SUBCUTANEOUS
  Administered 2016-12-20: 2 [IU] via SUBCUTANEOUS
  Administered 2016-12-21 (×4): 1 [IU] via SUBCUTANEOUS
  Administered 2016-12-21 (×2): 2 [IU] via SUBCUTANEOUS
  Administered 2016-12-21 – 2016-12-22 (×5): 1 [IU] via SUBCUTANEOUS
  Administered 2016-12-23: 2 [IU] via SUBCUTANEOUS
  Administered 2016-12-23 – 2016-12-25 (×8): 1 [IU] via SUBCUTANEOUS
  Administered 2016-12-25: 2 [IU] via SUBCUTANEOUS
  Administered 2016-12-25 (×3): 1 [IU] via SUBCUTANEOUS
  Administered 2016-12-26 (×2): 2 [IU] via SUBCUTANEOUS
  Administered 2016-12-26 – 2016-12-27 (×2): 1 [IU] via SUBCUTANEOUS
  Administered 2016-12-27: 2 [IU] via SUBCUTANEOUS
  Administered 2016-12-27 – 2016-12-28 (×9): 1 [IU] via SUBCUTANEOUS
  Administered 2016-12-29: 2 [IU] via SUBCUTANEOUS
  Administered 2016-12-29: 1 [IU] via SUBCUTANEOUS
  Administered 2016-12-29: 2 [IU] via SUBCUTANEOUS
  Administered 2016-12-29 – 2016-12-30 (×4): 1 [IU] via SUBCUTANEOUS
  Administered 2016-12-30: 2 [IU] via SUBCUTANEOUS
  Administered 2016-12-30 – 2016-12-31 (×4): 1 [IU] via SUBCUTANEOUS
  Administered 2016-12-31: 2 [IU] via SUBCUTANEOUS
  Administered 2017-01-01: 1 [IU] via SUBCUTANEOUS
  Administered 2017-01-01 (×3): 2 [IU] via SUBCUTANEOUS
  Administered 2017-01-01: 1 [IU] via SUBCUTANEOUS

## 2016-12-08 MED ORDER — FENTANYL CITRATE (PF) 100 MCG/2ML IJ SOLN
INTRAMUSCULAR | Status: AC
  Filled 2016-12-08: qty 2

## 2016-12-08 MED ORDER — CHLORHEXIDINE GLUCONATE 0.12% ORAL RINSE (MEDLINE KIT)
15.0000 mL | Freq: Two times a day (BID) | OROMUCOSAL | Status: DC
  Administered 2016-12-08 – 2016-12-11 (×7): 15 mL via OROMUCOSAL

## 2016-12-08 MED ORDER — VANCOMYCIN HCL IN DEXTROSE 1-5 GM/200ML-% IV SOLN
1000.0000 mg | Freq: Once | INTRAVENOUS | Status: AC
  Administered 2016-12-08: 1000 mg via INTRAVENOUS
  Filled 2016-12-08: qty 200

## 2016-12-08 MED ORDER — SODIUM CHLORIDE 0.9 % IV SOLN
INTRAVENOUS | Status: DC
  Filled 2016-12-08: qty 2.5

## 2016-12-08 MED ORDER — PIPERACILLIN-TAZOBACTAM 3.375 G IVPB 30 MIN
3.3750 g | Freq: Once | INTRAVENOUS | Status: AC
  Administered 2016-12-08: 3.375 g via INTRAVENOUS
  Filled 2016-12-08: qty 50

## 2016-12-08 MED ORDER — SODIUM CHLORIDE 0.9 % IV BOLUS (SEPSIS)
1000.0000 mL | Freq: Once | INTRAVENOUS | Status: AC
  Administered 2016-12-08: 1000 mL via INTRAVENOUS

## 2016-12-08 MED ORDER — SODIUM CHLORIDE 0.9 % IV SOLN
250.0000 mL | INTRAVENOUS | Status: DC | PRN
  Administered 2016-12-14: 250 mL via INTRAVENOUS

## 2016-12-08 MED ORDER — MORPHINE SULFATE (PF) 4 MG/ML IV SOLN
2.0000 mg | INTRAVENOUS | Status: DC | PRN
  Administered 2016-12-08: 2 mg via INTRAVENOUS
  Filled 2016-12-08: qty 1

## 2016-12-08 MED ORDER — MIDAZOLAM HCL 2 MG/2ML IJ SOLN
INTRAMUSCULAR | Status: AC
  Filled 2016-12-08: qty 2

## 2016-12-08 MED ORDER — MIDAZOLAM HCL 2 MG/2ML IJ SOLN
1.0000 mg | INTRAMUSCULAR | Status: DC | PRN
  Administered 2016-12-08: 2 mg via INTRAVENOUS
  Administered 2016-12-09: 1 mg via INTRAVENOUS
  Filled 2016-12-08 (×2): qty 2

## 2016-12-08 MED ORDER — FENTANYL CITRATE (PF) 100 MCG/2ML IJ SOLN: 50.0000 ug | Freq: Once | INTRAMUSCULAR | Status: DC

## 2016-12-08 MED ORDER — FENTANYL CITRATE (PF) 100 MCG/2ML IJ SOLN
50.0000 ug | Freq: Once | INTRAMUSCULAR | Status: AC
  Administered 2016-12-08: 50 ug via INTRAVENOUS

## 2016-12-08 MED ORDER — ROCURONIUM BROMIDE 50 MG/5ML IV SOLN
INTRAVENOUS | Status: AC | PRN
  Administered 2016-12-08: 50 mg via INTRAVENOUS

## 2016-12-08 MED ORDER — FENTANYL CITRATE (PF) 100 MCG/2ML IJ SOLN
INTRAMUSCULAR | Status: AC | PRN
  Administered 2016-12-08: 50 ug via INTRAVENOUS

## 2016-12-08 MED ORDER — HEPARIN SODIUM (PORCINE) 5000 UNIT/ML IJ SOLN
5000.0000 [IU] | Freq: Three times a day (TID) | INTRAMUSCULAR | Status: DC
  Administered 2016-12-08 – 2016-12-16 (×25): 5000 [IU] via SUBCUTANEOUS
  Filled 2016-12-08 (×24): qty 1

## 2016-12-08 MED ORDER — ETOMIDATE 2 MG/ML IV SOLN
INTRAVENOUS | Status: AC | PRN
  Administered 2016-12-08: 20 mg via INTRAVENOUS

## 2016-12-08 MED ORDER — SODIUM BICARBONATE 8.4 % IV SOLN
INTRAVENOUS | Status: AC
  Filled 2016-12-08: qty 50

## 2016-12-08 MED ORDER — ONDANSETRON HCL 4 MG/2ML IJ SOLN: 4.0000 mg | Freq: Once | INTRAMUSCULAR | Status: DC

## 2016-12-08 MED ORDER — SODIUM CHLORIDE 0.9 % IV SOLN
INTRAVENOUS | Status: DC
  Administered 2016-12-08: 2.8 [IU]/h via INTRAVENOUS
  Filled 2016-12-08: qty 2.5

## 2016-12-08 MED ORDER — SODIUM CHLORIDE 0.9 % IV SOLN
30.0000 meq | Freq: Once | INTRAVENOUS | Status: AC
  Administered 2016-12-08: 30 meq via INTRAVENOUS
  Filled 2016-12-08: qty 15

## 2016-12-08 MED ORDER — ACETAMINOPHEN 325 MG PO TABS
650.0000 mg | ORAL_TABLET | Freq: Four times a day (QID) | ORAL | Status: DC | PRN
  Administered 2016-12-13 – 2016-12-21 (×8): 650 mg via ORAL
  Filled 2016-12-08 (×8): qty 2

## 2016-12-08 MED ORDER — PIPERACILLIN-TAZOBACTAM 3.375 G IVPB
3.3750 g | Freq: Three times a day (TID) | INTRAVENOUS | Status: DC
  Administered 2016-12-08 – 2016-12-11 (×9): 3.375 g via INTRAVENOUS
  Filled 2016-12-08 (×10): qty 50

## 2016-12-08 MED ORDER — MIDAZOLAM HCL 5 MG/5ML IJ SOLN
INTRAMUSCULAR | Status: AC | PRN
  Administered 2016-12-08: 1 mg via INTRAVENOUS

## 2016-12-08 MED ORDER — SODIUM BICARBONATE 8.4 % IV SOLN
INTRAVENOUS | Status: DC
  Administered 2016-12-08 – 2016-12-09 (×3): via INTRAVENOUS
  Filled 2016-12-08 (×6): qty 150

## 2016-12-08 MED ORDER — SODIUM BICARBONATE 8.4 % IV SOLN
INTRAVENOUS | Status: AC | PRN
  Administered 2016-12-08: 50 meq via INTRAVENOUS

## 2016-12-08 NOTE — Progress Notes (Addendum)
eLink Physician-Brief Progress Note Patient Name: Adrienne [name] DOB: December 30, 1935 MRN: [id number]   Date of Service  12/08/2016  HPI/Events of Note  Per nurse, CBG has been stable the last 12-24 hours.   eICU Interventions  Will decrease CBG monitoring from every hr to  every 4 hours . Start sliding scale.   Will also add H2 blocker for SUP.      Intervention Category Intermediate Interventions: Hyperglycemia - evaluation and treatment  Daneen SchickJose Angelo A De Dios 12/08/2016, 8:16 PM

## 2016-12-08 NOTE — Progress Notes (Signed)
ABG collected  

## 2016-12-08 NOTE — ED Provider Notes (Signed)
By signing my name below, I, Adrienne [name], attest that this documentation has been prepared under the direction and in the presence of Adrienne N Ward, DO . Electronically Signed: Freida [name], Scribe. 12/08/2016. 3:11 AM.  TIME SEEN: 3:05 AM  CHIEF COMPLAINT:  Chief Complaint  Patient presents with  . Respiratory Distress    LEVEL 5 CAVEAT DUE TO ACUITY OF CONDITION   HPI:   HPI Comments:  Adrienne [name] is a 81 y.o. female with a history of DM, HTN, sleep apnea, who presents to the Emergency Department via EMS from [facility] for respiratory distress. At this time pt is complaining of abdominal discomfort that began yesterday (12/07/16) afternoon. Pt denies h/o liver failure and fluid build up in her abdomen.No history of paracentesis. Denies history of bowel obstruction. No fever.  Per EMS, pt had nausea, vomiting and a syncopal episode yesterday.  Patient unable to confirm this. She was was not evaluated after this incident. Pt is full code. She denies chest pain currently. Denies nausea.  ROS: See HPI LEVEL 5 CAVEAT DUE TO ACUITY OF CONDITION   PAST MEDICAL HISTORY/PAST SURGICAL HISTORY:  Past Medical History:  Diagnosis Date  . Anxiety   . Depression   . Diabetes mellitus    ORAL MED  . Frequency of urination   . Headache(784.0) 10/23/2013  . Hypercholesterolemia   . Hypertension   . Hypothyroidism    STATES SHE NO LONGER NEEDS THYROID SUPPLEMENT  . Nocturia   . Normal nuclear stress test Jan 2012   No ischemia. EF 81%  . Obesity   . Osteoarthritis (arthritis due to wear and tear of joints)    PAIN AND OA BOTH KNEES  . Osteopenia   . Shortness of breath    WITH EXERTION  . Sleep apnea    DOES NOT USE CPAP-UNABLE TO TOLERATE  . Urinary frequency     MEDICATIONS:  Prior to Admission medications   Medication Sig Start Date End Date Taking? Authorizing Provider  acetaminophen (TYLENOL) 500 MG tablet Take 500 mg by mouth every 6 (six) hours as needed for  pain.    Historical Provider, MD  aspirin EC 81 MG tablet Take 81 mg by mouth daily.    Historical Provider, MD  atorvastatin (LIPITOR) 20 MG tablet Take 20 mg by mouth daily before supper.     Historical Provider, MD  butalbital-acetaminophen-caffeine (FIORICET, ESGIC) 50-325-40 MG per tablet Take 50 tablets by mouth. 10/15/13   Historical Provider, MD  Calcium Carbonate (CALTRATE 600 PO) Take 1 tablet by mouth 2 (two) times daily.    Historical Provider, MD  cefUROXime (CEFTIN) 500 MG tablet Take 1 tablet (500 mg total) by mouth 2 (two) times daily with a meal. 11/05/13   [name], MD  Cholecalciferol (VITAMIN D PO) Take 2 tablets by mouth daily.     Historical Provider, MD  DYMISTA 137-50 MCG/ACT SUSP Place 137 mcg into alternate nostrils 2 (two) times daily.  10/15/13   Historical Provider, MD  losartan (COZAAR) 50 MG tablet Take 1 tablet (50 mg total) by mouth daily. 11/05/13   [name], MD  memantine (NAMENDA) 10 MG tablet Take 10 mg by mouth 2 (two) times daily.    Historical Provider, MD  metFORMIN (GLUCOPHAGE) 500 MG tablet Take 500 mg by mouth 2 (two) times daily.     Historical Provider, MD  mirtazapine (REMERON) 15 MG tablet Take 7.5 mg by mouth at bedtime. 1/2 TABLET AT BEDTIME  Historical Provider, MD  Multiple Vitamin (MULTIVITAMIN WITH MINERALS) TABS tablet Take 1 tablet by mouth daily.    Historical Provider, MD  QUEtiapine (SEROQUEL) 12.5 mg TABS tablet Take 0.5 tablets (12.5 mg total) by mouth at bedtime. 11/05/13   [name], MD  sertraline (ZOLOFT) 50 MG tablet Take 50 mg by mouth every morning. TAKE 1/2 OF 50 MG TABLET 04/25/11   Historical Provider, MD  Solifenacin Succinate (VESICARE PO) Take 1 tablet by mouth daily.    Historical Provider, MD  tobramycin-dexamethasone [facility]) ophthalmic solution Place 2 drops into both eyes 4 (four) times daily. 11/05/13   [name], MD    ALLERGIES:  Allergies  Allergen Reactions  . Prednisone Shortness Of  Breath  . Ibuprofen Other (See Comments)    Swelling mouth and lips  . Meclizine Other (See Comments)    HALLUCINATIONS  . Sulfonamide Derivatives     REACTION: Reaction not known    SOCIAL HISTORY:  Social History  Substance Use Topics  . Smoking status: Never Smoker  . Smokeless tobacco: Never Used  . Alcohol use No    FAMILY HISTORY: Family History  Problem Relation Age of Onset  . Heart disease Father   . Hypertension Father   . Heart attack Father   . Colon cancer Mother   . Heart attack Mother   . Hypertension Mother   . Breast cancer Sister     EXAM: BP 121/61 (BP Location: Right Arm)   Pulse 112   Temp 97.7 F (36.5 C) (Oral)   Resp (!) 38   SpO2 94%  CONSTITUTIONAL: Alert.  Elderly and chronically ill appearing. Oriented to person and place but not time. Mild respiratory distress HEAD: Normocephalic EYES: Conjunctivae clear, PERRL, EOMI ENT: normal nose; no rhinorrhea; dry mucous membranes NECK: Supple, no meningismus, no nuchal rigidity, no LAD  CARD: regular and tachycardic; S1 and S2 appreciated; no murmurs, no clicks, no rubs, no gallops RESP:  lungs clear; mild - moderate respiratory distress. Speaking in short sentences, hypoxic on room air; tachypneic; no rhonchi or rales or wheezing ABD/GI: diffusely tender to palpation; significantly distended abdomen with tympany; no fluid wave, no appreciable bowel sounds; umbilical hernia noted that I am unable to reduce but may be because of significant distention of the abdomen BACK:  The back appears normal and is non-tender to palpation, there is no CVA tenderness EXT: Normal ROM in all joints; non-tender to palpation; no edema; normal capillary refill; no cyanosis, no calf tenderness or swelling    SKIN: Normal color for age and race; warm; no rash NEURO: Moves all extremities equally, mildly slurred speech    EKG Interpretation  Date/Time:  Wednesday December 08 2016 02:40:45 EST Ventricular Rate:   111 PR Interval:    QRS Duration: 100 QT Interval:  362 QTC Calculation: 492 R Axis:   57 Text Interpretation:  Sinus tachycardia Probable left atrial enlargement Probable inferior infarct, age indeterminate Lateral leads are also involved No significant change since last tracing Confirmed by WARD,  DO, Adrienne ([id number]) on 12/08/2016 2:48:31 AM         MEDICAL DECISION MAKING: Patient here with abdominal distention and pain. Suspect this is causing her to feel short of breath. She is hypoxic on room air. Lungs are clear. We'll obtain emergent upright films of her chest and abdomen and has I'm concerned about perforation versus bowel obstruction versus dead bowel. We'll give IV fluids. We'll give pain medication.  ED PROGRESS: Patient's labs show  significant elevated lactate of 11. Also has mild leukocytosis with left shift. The patient has acute renal failure. She has a metabolic acidosis likely secondary to her elevated lactate. She will receive broad-spectrum antibiotics. She is afebrile here. X-ray for chest is clear. X-ray of her abdomen shows possible high-grade bowel obstruction. Discussed with Dr. Harlon Florseui who agrees with CT scan and placing gastric tube. We did discuss patient's significant abdominal distention, peritoneal abdomen with significantly elevated lactate. Recommend reconsultation if there is any dead gut, perforation on CT scan.  4:50 AM  Pt's daughter now at bedside. She has been updated. Patient is a full code and would want full treatment. PCP is Dr. Evlyn KannerSouth. CT scan shows fat-containing umbilical hernia with signs of incarceration/strangulation. There is no necrotic bowel appreciated, no perforation on CT scan. Now that she has a gastric tube in place we have gone out approximately a liter of brown appearing liquid in her abdomen is much less distended and I'm able to reduce this hernia without difficulty. She has dilated and fluid-filled loops of small bowel without definite  transition zone. This may be ileus versus enteritis versus small bowel obstruction. No dead bowel seen. No perforation. Patient reports feeling better after gastric tube placed. We will repeat her lactate.  5:40 AM  Pt's Lactate has decreased slightly to 10.2. Glucose is still elevated at 341. We will start insulin drip In case any of her lactic acidosis is secondary to DKA. Discussed with Dr. Julian ReilGardner with hospitalist service. He would like critical care to see the patient and evaluate in the ED to see if she would be more appropriate for an ICU bed given her significantly elevated lactate which she is concerned significantly elevates her mortality. He is concerned about necrotic bowel given this elevated lactate. Discussed with him that patient's abdominal exam has improved somewhat and she has a non-peritoneal abdomen at this time with a reducible hernia. We also discussed that this could be lactic acidosis from metformin. Currently no other sign of infection but she is getting broad-spectrum antibiotics. Will continue 30 mL/kg IVF bolus.    5:45 AM  D/w Dr. Vassie LollAlva with critical care. They will see the patient in the emergency department. He would like general surgery to consult on the patient in the emergency department. We will consult Dr. Corliss Skainssuei again to see patient in the emergency department. Their concern was that if her lactate is not clearing could this still truly be an incarcerated/strangulated hernia.   6:00 AM  Dr. Corliss Skainssuei in ED to see patient.  Dr. Julian ReilGardner has seen patient.  Critical care to see patient.  We appreciate their help.   6:15 AM  Pt's urine shows no sign of infection. No ketones which makes DKA less likely. Chest x-ray showed no pneumonia.  6:40 AM  CCM has seen patient and agrees with admission. General surgery has seen patient and they do not feel that the incarcerated fat containing hernia would be the cause of her elevated lactate. Would like to keep NG tube and monitor  patient closely. Patient and daughter have been updated. Hemodynamically stable. Blood pressure of 69/57 is incorrect and documented erroneously. She has been normotensive throughout her emergency department stay.  I reviewed all nursing notes, vitals, pertinent old records, EKGs, labs, imaging (as available).      EKG Interpretation  Date/Time:  Wednesday December 08 2016 02:40:45 EST Ventricular Rate:  111 PR Interval:    QRS Duration: 100 QT Interval:  362 QTC Calculation: 492  R Axis:   57 Text Interpretation:  Sinus tachycardia Probable left atrial enlargement Probable inferior infarct, age indeterminate Lateral leads are also involved No significant change since last tracing Confirmed by WARD,  DO, Adrienne [phone]) on 12/08/2016 2:48:31 AM      CRITICAL CARE Performed by: Raelyn Number   Total critical care time: 60 minutes  Critical care time was exclusive of separately billable procedures and treating other patients.  Critical care was necessary to treat or prevent imminent or life-threatening deterioration.  Critical care was time spent personally by me on the following activities: development of treatment plan with patient and/or surrogate as well as nursing, discussions with consultants, evaluation of patient's response to treatment, examination of patient, obtaining history from patient or surrogate, ordering and performing treatments and interventions, ordering and review of laboratory studies, ordering and review of radiographic studies, pulse oximetry and re-evaluation of patient's condition.   I personally performed the services described in this documentation, which was scribed in my presence. The recorded information has been reviewed and is accurate.     Layla Maw Ward, DO 12/08/16 [phone]

## 2016-12-08 NOTE — ED Notes (Signed)
Delay in lab draw,  Pt not in room at this time. 

## 2016-12-08 NOTE — Progress Notes (Signed)
eLink Physician-Brief Progress Note Patient Name: Susa Simmondslly M Aamodt DOB: 08/06/1936 MRN: [id number]   Date of Service  12/08/2016  HPI/Events of Note  Follow-up in the patient. Was intubated earlier for respiratory failure.   ABG 7.45, 35, 239 on 100% FiO2, PEEP 10.   Lactic acid better at 4.3 from 11.   eICU Interventions  Continued to trend lactic acid.  FiO2, try to wean down, keep O2 saturations more than 88%. Currently on 70%.       Intervention Category Intermediate Interventions: Other:  Louann SjogrenJose Angelo A De Dios 12/08/2016, 5:26 PM

## 2016-12-08 NOTE — Progress Notes (Signed)
CRITICAL VALUE ALERT  Critical value received:  Lactic 4.3 Date of notification: 12/08/2016  Time of notification:  1705  Critical value read back:Yes.    Nurse who received alert:  Deretha EmoryMildred Shaw RN  MD notified (1st page):  Sarah RN Elink   Time of first page:  1709    Responding MD: Dr.  Nash DimmerAngelo De Dios   NO orders improvement

## 2016-12-08 NOTE — ED Notes (Signed)
PCCM at bedside. °

## 2016-12-08 NOTE — Procedures (Signed)
Intubation Procedure Note Adrienne [name] [id number] [birth year]  Procedure: Intubation Indications: Respiratory insufficiency  Procedure Details Consent: Risks of procedure as well as the alternatives and risks of each were explained to the (patient/caregiver).  Consent for procedure obtained. Time Out: Verified patient identification, verified procedure, site/side was marked, verified correct patient position, special equipment/implants available, medications/allergies/relevent history reviewed, required imaging and test results available.  Performed  Maximum sterile technique was used including gloves, hand hygiene and mask.  MAC    Evaluation Hemodynamic Status: BP stable throughout; O2 sats: stable throughout Patient's Current Condition: stable Complications: No apparent complications Patient did tolerate procedure well. Chest X-ray ordered to verify placement.  CXR: pending.   Adrienne [name] 12/08/2016

## 2016-12-08 NOTE — Progress Notes (Signed)
CRITICAL VALUE ALERT  Critical value received:  Lactic 4.2  Date of notification:  12/08/2016  Time of notification:  *2233  Critical value read back:Yes.    Nurse who received alert:  Dorna LeitzAmanda Guffey RN  Trending down MD aware

## 2016-12-08 NOTE — Consult Note (Signed)
Reason for Consult:  Abdominal pain/ distention; elevated lactate Referring Physician: Ward  Adrienne [name] is an 81 y.o. female.  HPI: This is a 81 yo female with multiple medical issues who is a resident of [facility] presents with a one-day history of abdominal discomfort.  Her main complaint is respiratory distress.  Last BM was either yesterday or Monday.  She has had some nausea and vomiting reportedly.  She denies any current nausea.  No chest pain.    NG tube was placed by ED - 1750 cc output with softer abdomen; relatively decreased work of breathing  Past Medical History:  Diagnosis Date  . Anxiety   . Depression   . Diabetes mellitus    ORAL MED  . Frequency of urination   . Headache(784.0) 10/23/2013  . Hypercholesterolemia   . Hypertension   . Hypothyroidism    STATES SHE NO LONGER NEEDS THYROID SUPPLEMENT  . Nocturia   . Normal nuclear stress test Jan 2012   No ischemia. EF 81%  . Obesity   . Osteoarthritis (arthritis due to wear and tear of joints)    PAIN AND OA BOTH KNEES  . Osteopenia   . Shortness of breath    WITH EXERTION  . Sleep apnea    DOES NOT USE CPAP-UNABLE TO TOLERATE  . Urinary frequency     Past Surgical History:  Procedure Laterality Date  . ABDOMINAL HYSTERECTOMY    . BILATERAL OOPHORECTOMY    . CARDIAC CATHETERIZATION  02/03/1999   EF 70%  . CARDIOVASCULAR STRESS TEST  11/30/2010   EF 81%, NORMAL  . ORIF ANKLE FRACTURE  2004  . THYROIDECTOMY, PARTIAL    . TOTAL KNEE ARTHROPLASTY  04/21/2012   Procedure: TOTAL KNEE ARTHROPLASTY;  Surgeon: Gearlean Alf, MD;  Location: [facility];  Service: Orthopedics;  Laterality: Right;  . TOTAL KNEE ARTHROPLASTY Left 01/12/2013   Procedure: TOTAL KNEE ARTHROPLASTY;  Surgeon: Gearlean Alf, MD;  Location: [facility];  Service: Orthopedics;  Laterality: Left;  . US ECHOCARDIOGRAPHY  08/21/2004   EF 60-65%  . VESICOVAGINAL FISTULA CLOSURE W/ TAH      Family History  Problem Relation Age of Onset  .  Heart disease Father   . Hypertension Father   . Heart attack Father   . Colon cancer Mother   . Heart attack Mother   . Hypertension Mother   . Breast cancer Sister     Social History:  reports that she has never smoked. She has never used smokeless tobacco. She reports that she does not drink alcohol or use drugs.  Allergies:  Allergies  Allergen Reactions  . Prednisone Shortness Of Breath  . Ibuprofen Other (See Comments)    Swelling mouth and lips  . Meclizine Other (See Comments)    HALLUCINATIONS  . Sulfonamide Derivatives     REACTION: Reaction not known    Medications:  Prior to Admission medications   Medication Sig Start Date End Date Taking? Authorizing Provider  acetaminophen (TYLENOL) 500 MG tablet Take 500 mg by mouth every 6 (six) hours as needed for pain.    Historical Provider, MD  aspirin EC 81 MG tablet Take 81 mg by mouth daily.    Historical Provider, MD  atorvastatin (LIPITOR) 20 MG tablet Take 20 mg by mouth daily before supper.     Historical Provider, MD  butalbital-acetaminophen-caffeine (FIORICET, ESGIC) 50-325-40 MG per tablet Take 50 tablets by mouth. 10/15/13   Historical Provider, MD  Calcium Carbonate (CALTRATE  600 PO) Take 1 tablet by mouth 2 (two) times daily.    Historical Provider, MD  cefUROXime (CEFTIN) 500 MG tablet Take 1 tablet (500 mg total) by mouth 2 (two) times daily with a meal. 11/05/13   Kelvin Cellar, MD  Cholecalciferol (VITAMIN D PO) Take 2 tablets by mouth daily.     Historical Provider, MD  DYMISTA 137-50 MCG/ACT SUSP Place 137 mcg into alternate nostrils 2 (two) times daily.  10/15/13   Historical Provider, MD  losartan (COZAAR) 50 MG tablet Take 1 tablet (50 mg total) by mouth daily. 11/05/13   Kelvin Cellar, MD  memantine (NAMENDA) 10 MG tablet Take 10 mg by mouth 2 (two) times daily.    Historical Provider, MD  metFORMIN (GLUCOPHAGE) 500 MG tablet Take 500 mg by mouth 2 (two) times daily.     Historical Provider, MD   mirtazapine (REMERON) 15 MG tablet Take 7.5 mg by mouth at bedtime. 1/2 TABLET AT BEDTIME    Historical Provider, MD  Multiple Vitamin (MULTIVITAMIN WITH MINERALS) TABS tablet Take 1 tablet by mouth daily.    Historical Provider, MD  QUEtiapine (SEROQUEL) 12.5 mg TABS tablet Take 0.5 tablets (12.5 mg total) by mouth at bedtime. 11/05/13   Kelvin Cellar, MD  sertraline (ZOLOFT) 50 MG tablet Take 50 mg by mouth every morning. TAKE 1/2 OF 50 MG TABLET 04/25/11   Historical Provider, MD  tobramycin-dexamethasone [facility]) ophthalmic solution Place 2 drops into both eyes 4 (four) times daily. 11/05/13   Kelvin Cellar, MD     Results for orders placed or performed during the hospital encounter of 12/08/16 (from the past 48 hour(s))  Comprehensive metabolic panel     Status: Abnormal   Collection Time: 12/08/16  2:50 AM  Result Value Ref Range   Sodium 138 135 - 145 mmol/L   Potassium 3.8 3.5 - 5.1 mmol/L   Chloride 99 (L) 101 - 111 mmol/L   CO2 12 (L) 22 - 32 mmol/L   Glucose, Bld 323 (H) 65 - 99 mg/dL   BUN 48 (H) 6 - 20 mg/dL   Creatinine, Ser 2.01 (H) 0.44 - 1.00 mg/dL   Calcium 9.0 8.9 - 10.3 mg/dL   Total Protein 7.7 6.5 - 8.1 g/dL   Albumin 3.5 3.5 - 5.0 g/dL   AST 44 (H) 15 - 41 U/L   ALT 26 14 - 54 U/L   Alkaline Phosphatase 38 38 - 126 U/L   Total Bilirubin 2.1 (H) 0.3 - 1.2 mg/dL   GFR calc non Af Amer 23 (L) >60 mL/min   GFR calc Af Amer 26 (L) >60 mL/min    Comment: (NOTE) The eGFR has been calculated using the CKD EPI equation. This calculation has not been validated in all clinical situations. eGFR's persistently <60 mL/min signify possible Chronic Kidney Disease.    Anion gap 27 (H) 5 - 15  CBC WITH DIFFERENTIAL     Status: Abnormal   Collection Time: 12/08/16  2:50 AM  Result Value Ref Range   WBC 10.8 (H) 4.0 - 10.5 K/uL   RBC 4.66 3.87 - 5.11 MIL/uL   Hemoglobin 14.9 12.0 - 15.0 g/dL   HCT 45.5 36.0 - 46.0 %   MCV 97.6 78.0 - 100.0 fL   MCH 32.0 26.0 -  34.0 pg   MCHC 32.7 30.0 - 36.0 g/dL   RDW 14.9 11.5 - 15.5 %   Platelets 229 150 - 400 K/uL   Neutrophils Relative % 78 %  Neutro Abs 8.3 (H) 1.7 - 7.7 K/uL   Lymphocytes Relative 13 %   Lymphs Abs 1.5 0.7 - 4.0 K/uL   Monocytes Relative 9 %   Monocytes Absolute 1.0 0.1 - 1.0 K/uL   Eosinophils Relative 0 %   Eosinophils Absolute 0.0 0.0 - 0.7 K/uL   Basophils Relative 0 %   Basophils Absolute 0.0 0.0 - 0.1 K/uL  Brain natriuretic peptide     Status: None   Collection Time: 12/08/16  2:50 AM  Result Value Ref Range   B Natriuretic Peptide 47.5 0.0 - 100.0 pg/mL  CBG monitoring, ED     Status: Abnormal   Collection Time: 12/08/16  2:59 AM  Result Value Ref Range   Glucose-Capillary 295 (H) 65 - 99 mg/dL  I-Stat CG4 Lactic Acid, ED  (not at  [facility])     Status: Abnormal   Collection Time: 12/08/16  3:27 AM  Result Value Ref Range   Lactic Acid, Venous 11.32 (HH) 0.5 - 1.9 mmol/L   Comment NOTIFIED PHYSICIAN   I-stat troponin, ED (not at [facility])     Status: None   Collection Time: 12/08/16  3:28 AM  Result Value Ref Range   Troponin i, poc 0.02 0.00 - 0.08 ng/mL   Comment 3            Comment: Due to the release kinetics of cTnI, a negative result within the first hours of the onset of symptoms does not rule out myocardial infarction with certainty. If myocardial infarction is still suspected, repeat the test at appropriate intervals.   I-stat chem 8, ed     Status: Abnormal   Collection Time: 12/08/16  4:18 AM  Result Value Ref Range   Sodium 139 135 - 145 mmol/L   Potassium 4.1 3.5 - 5.1 mmol/L   Chloride 104 101 - 111 mmol/L   BUN 48 (H) 6 - 20 mg/dL   Creatinine, Ser 1.80 (H) 0.44 - 1.00 mg/dL   Glucose, Bld 365 (H) 65 - 99 mg/dL   Calcium, Ion 1.00 (L) 1.15 - 1.40 mmol/L   TCO2 15 0 - 100 mmol/L   Hemoglobin 15.3 (H) 12.0 - 15.0 g/dL   HCT 45.0 36.0 - 46.0 %  I-Stat CG4 Lactic Acid, ED     Status: Abnormal   Collection Time: 12/08/16  5:13 AM  Result Value  Ref Range   Lactic Acid, Venous 10.27 (HH) 0.5 - 1.9 mmol/L   Comment NOTIFIED PHYSICIAN   CBG monitoring, ED     Status: Abnormal   Collection Time: 12/08/16  5:27 AM  Result Value Ref Range   Glucose-Capillary 341 (H) 65 - 99 mg/dL  Urinalysis, Routine w reflex microscopic     Status: Abnormal   Collection Time: 12/08/16  5:49 AM  Result Value Ref Range   Color, Urine AMBER (A) YELLOW    Comment: BIOCHEMICALS MAY BE AFFECTED BY COLOR   APPearance HAZY (A) CLEAR   Specific Gravity, Urine 1.021 1.005 - 1.030   pH 5.0 5.0 - 8.0   Glucose, UA 50 (A) NEGATIVE mg/dL   Hgb urine dipstick NEGATIVE NEGATIVE   Bilirubin Urine NEGATIVE NEGATIVE   Ketones, ur NEGATIVE NEGATIVE mg/dL   Protein, ur NEGATIVE NEGATIVE mg/dL   Nitrite NEGATIVE NEGATIVE   Leukocytes, UA NEGATIVE NEGATIVE    Ct Abdomen Pelvis Wo Contrast  Result Date: 12/08/2016 CLINICAL DATA:  81 year old female with nausea vomiting. EXAM: CT ABDOMEN AND PELVIS WITHOUT CONTRAST TECHNIQUE: Multidetector CT  imaging of the abdomen and pelvis was performed following the standard protocol without IV contrast. COMPARISON:  Abdominal radiograph dated 12/08/2016 abdominal CT dated 02/15/2006 FINDINGS: Evaluation of this exam is limited in the absence of intravenous contrast. Lower chest: Left lung base subsegmental atelectatic changes. Infiltrate is less likely. There is multi vessel coronary vascular calcification. No intra-abdominal free air or free fluid. Hepatobiliary: Diffuse fatty infiltration of the liver. The liver is mildly enlarged. There is a 2.0 x 1.7 cm focus of coarse calcification in the right lobe of the liver which is increased in size since the prior CT of 2007 likely sequela of prior inflammatory/infectious process. There is no intrahepatic biliary ductal dilatation. There multiple stones within the gallbladder as well as in the gallbladder neck. No pericholecystic fluid. Pancreas: Unremarkable. No pancreatic ductal dilatation  or surrounding inflammatory changes. Spleen: Normal in size without focal abnormality. Adrenals/Urinary Tract: Adrenal glands are unremarkable. Kidneys are normal, without renal calculi, focal lesion, or hydronephrosis. Bladder is unremarkable. Stomach/Bowel: The stomach is distended with fluid and gastric content. No evidence of gastric outlet obstruction. There is small amount of fluid in the distal esophagus likely from gastroesophageal reflux. Multiple dilated and fluid-filled loops of small bowel noted measuring up to 5.3 cm in diameter. The distal and terminal ileum demonstrate a normal caliber. Focal areas of kinking noted involving multiple loops of small bowel within the pelvis possibly related to underlying adhesion. There is however no definite transition zone identified. The dilatation of the small bowel may be related to an ileus, however an obstruction is not entirely excluded. Clinical correlation and follow-up with small bowel serous recommended. There is extensive colonic diverticulosis without active inflammatory changes. Air is noted within the colon. The appendix is not visualized with certainty. No inflammatory changes identified in the right lower quadrant. Vascular/Lymphatic: There is advanced aortoiliac atherosclerotic disease. No portal venous gas identified. Evaluation of the vasculature is limited in the absence of intravenous contrast. There is no adenopathy. There is mild diffuse mesenteric stranding and engorgement of the mesentery vasculature. Reproductive: Hysterectomy.  The ovaries appear unremarkable. Other: There is a 3.2 cm peritoneal defect at the level of the umbilicus with a fat containing umbilical hernia. There is inflammatory changes of the herniated fat with fluid within the hernia suspicious for strangulation. Correlation with clinical exam is recommended to evaluate for incarceration. No drainable fluid collection identified. Musculoskeletal: Osteopenia with degenerative  changes of the spine. There is sclerotic changes and compression deformity of the L2 vertebra as well as sclerotic changes of the L1, chronic appearing. Multilevel disc desiccation with vacuum phenomena noted. No acute fracture. IMPRESSION: Fat containing umbilical hernia with signs of incarceration/ strangulation. Clinical correlation is recommended. Dilated and fluid-filled loops of small bowel without definite focal transition zone. Findings may represent a reactive ileus secondary to inflammatory changes of the umbilical hernia or less likely an ileus related to enteritis. A small-bowel obstruction is not excluded. Correlation with clinical exam and follow-up with small-bowel series recommended. Extensive colonic diverticulosis.  There is air within the colon. Fatty liver. Cholelithiasis. Electronically Signed   By: Anner Crete M.D.   On: 12/08/2016 04:20   Dg Chest Port 1 View  Result Date: 12/08/2016 CLINICAL DATA:  81 year old female with shortness of breath. EXAM: PORTABLE CHEST 1 VIEW COMPARISON:  Chest radiograph 11/02/2013 FINDINGS: There are bibasilar and perihilar mild bronchiectatic changes and scarring. No focal consolidation, pleural effusion, or pneumothorax. Stable mild cardiomegaly. No acute osseous pathology. IMPRESSION: No active disease.  Electronically Signed   By: Anner Crete M.D.   On: 12/08/2016 03:28   Dg Abd Portable 1 View  Result Date: 12/08/2016 CLINICAL DATA:  Dyspnea, abdominal distention, concern for bowel perforation EXAM: PORTABLE ABDOMEN - 1 VIEW COMPARISON:  02/15/2006 FINDINGS: Dilated small bowel loops out of proportion nondistended appearing large bowel. Findings have the appearance of small bowel obstruction. No definite pneumoperitoneum although the study is compromised by patient body habitus. CT would be the optimal study of choice this correlation. IMPRESSION: Abnormal bowel gas pattern with gaseous dilatation of numerous small bowel loops out of  proportion to large bowel. Findings have the appearance high-grade small bowel obstruction. It should be noted that the abdomen and pelvis are incompletely imaged/included on this study. No definite pneumoperitoneum. Electronically Signed   By: Ashley Royalty M.D.   On: 12/08/2016 03:32    Review of Systems  Constitutional: Negative for weight loss.  HENT: Negative for ear discharge, ear pain, hearing loss and tinnitus.   Eyes: Negative for blurred vision, double vision, photophobia and pain.  Respiratory: Positive for shortness of breath. Negative for cough and sputum production.   Cardiovascular: Negative for chest pain.  Gastrointestinal: Positive for abdominal pain, nausea and vomiting.  Genitourinary: Negative for dysuria, flank pain, frequency and urgency.  Musculoskeletal: Negative for back pain, falls, joint pain, myalgias and neck pain.  Neurological: Negative for dizziness, tingling, sensory change, focal weakness, loss of consciousness and headaches.  Endo/Heme/Allergies: Does not bruise/bleed easily.  Psychiatric/Behavioral: Negative for depression, memory loss and substance abuse. The patient is not nervous/anxious.    Blood pressure (!) 123/52, pulse 102, temperature 98.2 F (36.8 C), temperature source Rectal, resp. rate (!) 32, weight 102.1 kg (225 lb), SpO2 96 %. Physical Exam Obese female - significant work of breathing Eyes:  Pupils equal, round; sclera anicteric HENT:  Oral mucosa moist; good dentition  Neck:  No masses palpated, no thyromegaly Lungs:  CTA bilaterally; normal respiratory effort CV:  Regular rate and rhythm; no murmurs; extremities well-perfused with no edema Abd:  Obese; hypoactive bowel sounds; distended but soft; no peritonitis; large umbilical hernia - reducible Skin:  Warm, dry; no sign of jaundice Psychiatric - drowsy but arousable; answers questions but has difficulty remembering recent events  Assessment/Plan: Abdominal distention/ nausea -  secondary to ileus Abdominal pain - does not appear to have peritonitis at this time.  Lactic acidosis is very concerning.  No portal venous gas on CT scan Umbilical hernia - reducible; soft; non-tender Renal dysfunction/ hepatic dysfunction - unclear etiology  Would continue searching for other sources of sepsis.  We will continue to follow abdominal exams/ lactate.  She would be at very high risk for perioperative complications with surgery but may require exploration if no other sources are discovered.     Juvenal Umar K. 12/08/2016, 6:22 AM

## 2016-12-08 NOTE — ED Notes (Signed)
RN and MD area of istat lactic acid resulting 10.27

## 2016-12-08 NOTE — H&P (Addendum)
Name: Adrienne [name] MRN: [id number] DOB: 02-01-1936    LOS: 0  PCCM ADMISSION NOTE  History of Present Illness: Patient is a [name] with pmhx significant for DM, HTN, OSA, and dementia who presents from her nursing home with abdominal distention and respiratory distress. Patient's daughter was called by the nursing home saying that patient's abdomen was distended and that she has one episode of emesis overnight. She was brought in to the ED for evaluation. History was limited due to baseline dementia and daughter was unable to provide much information. Patient was able to endorsed some intermittent fevers and SOB for the past few days. She also says she has been nauseous with abdominal pain. Last BM 3-4 days ago. Unable to perform complete ROS.    Lines / Drains: PIVs  Cultures: Blood 1/24 >> Urine 1/24 >>  Antibiotics: Vanc & Zosyn x 1 in ED   Tests / Events: Admit 1/24   The patient is sedated, intubated and unable to provide history, which was obtained for available medical records.    Past Medical History:  Diagnosis Date  . Anxiety   . Depression   . Diabetes mellitus    ORAL MED  . Frequency of urination   . Headache(784.0) 10/23/2013  . Hypercholesterolemia   . Hypertension   . Hypothyroidism    STATES SHE NO LONGER NEEDS THYROID SUPPLEMENT  . Nocturia   . Normal nuclear stress test Jan 2012   No ischemia. EF 81%  . Obesity   . Osteoarthritis (arthritis due to wear and tear of joints)    PAIN AND OA BOTH KNEES  . Osteopenia   . Shortness of breath    WITH EXERTION  . Sleep apnea    DOES NOT USE CPAP-UNABLE TO TOLERATE  . Urinary frequency    Past Surgical History:  Procedure Laterality Date  . ABDOMINAL HYSTERECTOMY    . BILATERAL OOPHORECTOMY    . CARDIAC CATHETERIZATION  02/03/1999   EF 70%  . CARDIOVASCULAR STRESS TEST  11/30/2010   EF 81%, NORMAL  . ORIF ANKLE FRACTURE  2004  . THYROIDECTOMY, PARTIAL    . TOTAL KNEE ARTHROPLASTY  04/21/2012    Procedure: TOTAL KNEE ARTHROPLASTY;  Surgeon: Loanne Drilling, MD;  Location: [facility];  Service: Orthopedics;  Laterality: Right;  . TOTAL KNEE ARTHROPLASTY Left 01/12/2013   Procedure: TOTAL KNEE ARTHROPLASTY;  Surgeon: Loanne Drilling, MD;  Location: [facility];  Service: Orthopedics;  Laterality: Left;  . US ECHOCARDIOGRAPHY  08/21/2004   EF 60-65%  . VESICOVAGINAL FISTULA CLOSURE W/ TAH     Prior to Admission medications   Medication Sig Start Date End Date Taking? Authorizing Provider  acetaminophen (TYLENOL) 500 MG tablet Take 500 mg by mouth every 6 (six) hours as needed for pain.    Historical Provider, MD  aspirin EC 81 MG tablet Take 81 mg by mouth daily.    Historical Provider, MD  atorvastatin (LIPITOR) 20 MG tablet Take 20 mg by mouth daily before supper.     Historical Provider, MD  butalbital-acetaminophen-caffeine (FIORICET, ESGIC) 50-325-40 MG per tablet Take 50 tablets by mouth. 10/15/13   Historical Provider, MD  Calcium Carbonate (CALTRATE 600 PO) Take 1 tablet by mouth 2 (two) times daily.    Historical Provider, MD  cefUROXime (CEFTIN) 500 MG tablet Take 1 tablet (500 mg total) by mouth 2 (two) times daily with a meal. 11/05/13   [name], MD  Cholecalciferol (VITAMIN D PO) Take 2  tablets by mouth daily.     Historical Provider, MD  DYMISTA 137-50 MCG/ACT SUSP Place 137 mcg into alternate nostrils 2 (two) times daily.  10/15/13   Historical Provider, MD  losartan (COZAAR) 50 MG tablet Take 1 tablet (50 mg total) by mouth daily. 11/05/13   [name], MD  memantine (NAMENDA) 10 MG tablet Take 10 mg by mouth 2 (two) times daily.    Historical Provider, MD  metFORMIN (GLUCOPHAGE) 500 MG tablet Take 500 mg by mouth 2 (two) times daily.     Historical Provider, MD  mirtazapine (REMERON) 15 MG tablet Take 7.5 mg by mouth at bedtime. 1/2 TABLET AT BEDTIME    Historical Provider, MD  Multiple Vitamin (MULTIVITAMIN WITH MINERALS) TABS tablet Take 1 tablet by mouth daily.     Historical Provider, MD  QUEtiapine (SEROQUEL) 12.5 mg TABS tablet Take 0.5 tablets (12.5 mg total) by mouth at bedtime. 11/05/13   [name], MD  sertraline (ZOLOFT) 50 MG tablet Take 50 mg by mouth every morning. TAKE 1/2 OF 50 MG TABLET 04/25/11   Historical Provider, MD  Solifenacin Succinate (VESICARE PO) Take 1 tablet by mouth daily.    Historical Provider, MD  tobramycin-dexamethasone [facility]) ophthalmic solution Place 2 drops into both eyes 4 (four) times daily. 11/05/13   [name], MD   Allergies Allergies  Allergen Reactions  . Prednisone Shortness Of Breath  . Ibuprofen Other (See Comments)    Swelling mouth and lips  . Meclizine Other (See Comments)    HALLUCINATIONS  . Sulfonamide Derivatives     REACTION: Reaction not known    Family History Family History  Problem Relation Age of Onset  . Heart disease Father   . Hypertension Father   . Heart attack Father   . Colon cancer Mother   . Heart attack Mother   . Hypertension Mother   . Breast cancer Sister     Social History  reports that she has never smoked. She has never used smokeless tobacco. She reports that she does not drink alcohol or use drugs.  Review Of Systems  11 points review of systems is negative with an exception of listed in HPI.  Vital Signs: Temp:  [97.7 F (36.5 C)-98.2 F (36.8 C)] 98.2 F (36.8 C) (01/24 0400) Pulse Rate:  [102-112] 102 (01/24 0516) Resp:  [26-44] 32 (01/24 0516) BP: (104-130)/(51-62) 123/52 (01/24 0516) SpO2:  [92 %-96 %] 96 % (01/24 0516) Weight:  [102.1 kg (225 lb)] 102.1 kg (225 lb) (01/24 0330) No intake/output data recorded.  Physical Examination: General:  No acute distress Neuro:  Alert and oriented to person and place but not time (baseline per daughter), CN II - XII grossly intact, no deficits    HEENT:  Non rebreather  Neck:  Supple, trache midline    Cardiovascular: Tachycardic but regular,  Lungs:  Mild inspiratory crackles, no wheezing  or rhonchi  Abdomen:  Distended, firm, and tympanic. Umbilical hernia that is soft and reducible, no guarding, no rebound tenderness, normal bowel sounds. NGT in place draining brown fluid  Extremities:  Warm and well perfused, no edema  Skin:  No rashes or erythema   Ventilator settings:    Labs and Imaging:   CXR 1/24 >> negative  Abdominal Xray 1/24 >> high grade SBO, no definite pneumoperitoneum  CT abdomen pelvis 1/24 >> fat containing umbilical hernia with incarceration/strangulation, also with findings concerning for ileus vs SBO   Assessment and Plan:  PULMONARY  ASSESSMENT: Acute  hypoxic respiratory failure - secondary to SBO and metabolic acidosis  Tachypnea, likely compensating  Hx OSA  PLAN:   Continue non re breather BiPAP prn - would want intubation if necessary  ABG     CARDIOVASCULAR  ASSESSMENT:  Sinus Tach  Hemodynamically stable  Hx HLD Hx HTN PLAN:  Hold home antihypertensives  Hold home ASA    RENAL  ASSESSMENT:   Renal dysfunction (unclear baseline or chronicity)  Anion gap metabolic acidosis - secondary to lactic acidosis  PLAN:   Follow lactic acid Bicarb gtt in D5W Follow BMP  Avoid nephrotoxic meds  Goal potassium > 4 and mag > 2  GASTROINTESTINAL  ASSESSMENT:   SBO vs. Ileus  Umbilical hernia, reducible on exam  PLAN:   NPO  Surgery consulted; appreciate recs  NGT - 2L brown output thus far, still actively draining  Repeat abd xray @ 15:00 Serial abdominal exams   HEMATOLOGIC  ASSESSMENT:   No acute issues PLAN:  Follow CBC  Sub q heparin ppx   INFECTIOUS  ASSESSMENT:   Lactic acidosis  Possible enteritis  PLAN:   Follow cultures  Zosyn 1/24 >>  Consider narrowing abx for GI pathogens   ENDOCRINE  ASSESSMENT:   Hyperglycemia Hx DM   PLAN:   Insulin drip   NEUROLOGIC  ASSESSMENT:   No acute issues  PLAN:   Rass goal 0  No sedation   Family Update: Daughter at bedside. Updated on plan. Patient is  full code.    Reymundo Pollarolyn Guilloud, M.D. Pager: [phone] 12/08/2016, 7:05 AM   STAFF NOTE: I, Rory Percyaniel Feinstein, MD FACP have personally reviewed patient's available data, including medical history, events of note, physical examination and test results as part of my evaluation. I have discussed with resident/NP and other care providers such as pharmacist, RN and RRT. In addition, I personally evaluated patient and elicited key findings of: awakens, fc minimal, rr 22, clear lungs, abdo distended tympanic, tender, no r/g, CT reviewed and surgery note reviewed, lactic  Acid is down, abg is reassuring, I have concerns about colon ischemia, continue ngt drainage, continued empiric nosocomial abx abdo source, bicarb for now is okay however may need to change to saline pending bmet to follow, get coags in case OR trip needed in future, continue volume and lactic assessment, ldh assessment needed, insulin drip is reasonable given NPO status, I updated daughter in full, and we discussed code status, she is wishing full measures for now, may need line cna cvp, is also hypovolemic, needl ft,amy, lip The patient is critically ill with multiple organ systems failure and requires high complexity decision making for assessment and support, frequent evaluation and titration of therapies, application of advanced monitoring technologies and extensive interpretation of multiple databases.   Critical Care Time devoted to patient care services described in this note is 35 Minutes. This time reflects time of care of this signee: Rory Percyaniel Feinstein, MD FACP. This critical care time does not reflect procedure time, or teaching time or supervisory time of PA/NP/Med student/Med Resident etc but could involve care discussion time. Rest per NP/medical resident whose note is outlined above and that I agree with   Mcarthur Rossettianiel J. Tyson AliasFeinstein, MD, FACP Pgr: [phone] [facility] 12/08/2016 8:25 AM

## 2016-12-08 NOTE — ED Notes (Signed)
RN notified of pt's iStat Lactic Acid resulting to be 11.32. MD notified

## 2016-12-08 NOTE — ED Notes (Signed)
Patient alert oriented offered pain med and nausea med patient refused.

## 2016-12-08 NOTE — ED Notes (Signed)
Attempted report 

## 2016-12-08 NOTE — Progress Notes (Signed)
Attempting to wean O2 concentration. Pt is on 60% FiO2 at this time.

## 2016-12-08 NOTE — Progress Notes (Signed)
Called bedside for evolving respiratory failure.  ABG drawn with severe metabolic acidosis with respiratory compensation and worsening oxygenation.  On exam, WOB is very high and patient is tiring out.  I am concerned that she is evolving respiratory failure.  Spoke with daughter at length, after long discussion, patient is full code.  Will intubate and mechanically ventilate.  High MV to compensate for acidosis.  Continue abx as during intubation it was clear that there was gastric content as pirated.  F/U lactic acid and will place vent and sedation orders.  The patient is critically ill with multiple organ systems failure and requires high complexity decision making for assessment and support, frequent evaluation and titration of therapies, application of advanced monitoring technologies and extensive interpretation of multiple databases.   Critical Care Time devoted to patient care services described in this note is  55  Minutes. This time reflects time of care of this signee Dr Koren BoundWesam Yacoub. This critical care time does not reflect procedure time, or teaching time or supervisory time of PA/NP/Med student/Med Resident etc but could involve care discussion time.  Alyson ReedyWesam G. Yacoub, M.D. [facility]. Pager: [phone]. After hours pager: [phone].

## 2016-12-08 NOTE — Progress Notes (Signed)
Pharmacy Antibiotic Note  Adrienne [name] is a 81 y.o. female admitted on 12/08/2016 with intra-abd infection.  Pharmacy has been consulted for Zosyn dosing.  Zosyn 3.375gm given in ED ~0540  Plan: Zosyn 3.375gm IV q8h - doses over 4 hours Will f/u micro data, renal function, and pt's clinical condition  Weight: 225 lb (102.1 kg)  Temp (24hrs), Avg:98 F (36.7 C), Min:97.7 F (36.5 C), Max:98.2 F (36.8 C)   Recent Labs Lab 12/08/16 0250 12/08/16 0327 12/08/16 0418 12/08/16 0513 12/08/16 0653  WBC 10.8*  --   --   --   --   CREATININE 2.01*  --  1.80*  --   --   LATICACIDVEN  --  11.32*  --  10.27* 8.17*    CrCl cannot be calculated (Unknown ideal weight.).    Allergies  Allergen Reactions  . Prednisone Shortness Of Breath  . Ibuprofen Other (See Comments)    Swelling mouth and lips  . Meclizine Other (See Comments)    HALLUCINATIONS  . Sulfonamide Derivatives     REACTION: Reaction not known    Antimicrobials this admission: 1/24 Zosyn >>   Microbiology results: 1/24 BCx x2:  1/24 UCx:    Thank you for allowing pharmacy to be a part of this patient's care.  Adrienne [name], PharmD, BCPS Clinical pharmacist, pager [phone] 12/08/2016 7:12 AM

## 2016-12-08 NOTE — ED Triage Notes (Signed)
Pt to ED via [facility] from Nursing Home with resp distress.  EMS reports nursing home st's pt had nausea with vomiting yesterday. On arrival to ED pt on 02 via [facility] at 15 LPM

## 2016-12-09 ENCOUNTER — Inpatient Hospital Stay (HOSPITAL_COMMUNITY): Payer: Medicare Other

## 2016-12-09 LAB — BASIC METABOLIC PANEL
ANION GAP: 17 — AB (ref 5–15)
BUN: 58 mg/dL — ABNORMAL HIGH (ref 6–20)
CALCIUM: 7.7 mg/dL — AB (ref 8.9–10.3)
CHLORIDE: 97 mmol/L — AB (ref 101–111)
CO2: 28 mmol/L (ref 22–32)
CREATININE: 1.77 mg/dL — AB (ref 0.44–1.00)
GFR calc Af Amer: 31 mL/min — ABNORMAL LOW (ref >60)
GFR calc non Af Amer: 26 mL/min — ABNORMAL LOW (ref >60)
Glucose, Bld: 214 mg/dL — ABNORMAL HIGH (ref 65–99)
Potassium: 3.3 mmol/L — ABNORMAL LOW (ref 3.5–5.1)
SODIUM: 142 mmol/L (ref 135–145)

## 2016-12-09 LAB — POCT I-STAT 3, ART BLOOD GAS (G3+)
Acid-Base Excess: 7 mmol/L — ABNORMAL HIGH (ref 0.0–2.0)
Bicarbonate: 28.9 mmol/L — ABNORMAL HIGH (ref 20.0–28.0)
O2 SAT: 99 %
PH ART: 7.546 — AB (ref 7.350–7.450)
TCO2: 30 mmol/L (ref 0–100)
pCO2 arterial: 33.7 mmHg (ref 32.0–48.0)
pO2, Arterial: 139 mmHg — ABNORMAL HIGH (ref 83.0–108.0)

## 2016-12-09 LAB — URINE CULTURE: Culture: NO GROWTH

## 2016-12-09 LAB — CBC
HCT: 34.5 % — ABNORMAL LOW (ref 36.0–46.0)
Hemoglobin: 11.3 g/dL — ABNORMAL LOW (ref 12.0–15.0)
MCH: 31.5 pg (ref 26.0–34.0)
MCHC: 32.8 g/dL (ref 30.0–36.0)
MCV: 96.1 fL (ref 78.0–100.0)
Platelets: 156 10*3/uL (ref 150–400)
RBC: 3.59 MIL/uL — AB (ref 3.87–5.11)
RDW: 14.9 % (ref 11.5–15.5)
WBC: 5.7 10*3/uL (ref 4.0–10.5)

## 2016-12-09 LAB — GLUCOSE, CAPILLARY
Glucose-Capillary: 203 mg/dL — ABNORMAL HIGH (ref 65–99)
Glucose-Capillary: 202 mg/dL — ABNORMAL HIGH (ref 65–99)
Glucose-Capillary: 179 mg/dL — ABNORMAL HIGH (ref 65–99)
Glucose-Capillary: 181 mg/dL — ABNORMAL HIGH (ref 65–99)
Glucose-Capillary: 151 mg/dL — ABNORMAL HIGH (ref 65–99)

## 2016-12-09 LAB — MAGNESIUM: MAGNESIUM: 1.8 mg/dL (ref 1.7–2.4)

## 2016-12-09 LAB — PHOSPHORUS: Phosphorus: 2.1 mg/dL — ABNORMAL LOW (ref 2.5–4.6)

## 2016-12-09 LAB — LACTIC ACID, PLASMA: LACTIC ACID, VENOUS: 4.6 mmol/L — AB (ref 0.5–1.9)

## 2016-12-09 MED ORDER — POTASSIUM CHLORIDE 20 MEQ/15ML (10%) PO SOLN
40.0000 meq | Freq: Once | ORAL | Status: AC
  Administered 2016-12-09: 40 meq via ORAL
  Filled 2016-12-09: qty 30

## 2016-12-09 MED ORDER — MAGNESIUM SULFATE 2 GM/50ML IV SOLN
2.0000 g | Freq: Once | INTRAVENOUS | Status: AC
  Administered 2016-12-09: 2 g via INTRAVENOUS
  Filled 2016-12-09: qty 50

## 2016-12-09 MED ORDER — POTASSIUM PHOSPHATES 15 MMOLE/5ML IV SOLN
10.0000 mmol | Freq: Once | INTRAVENOUS | Status: AC
  Administered 2016-12-09: 10 mmol via INTRAVENOUS
  Filled 2016-12-09: qty 3.33

## 2016-12-09 NOTE — Progress Notes (Signed)
Initial Nutrition Assessment  DOCUMENTATION CODES:   Obesity unspecified  INTERVENTION:   Monitor progression of SBO.  Pt well nourished PTA therefore do not recommend TPN at this time unless bowel rest expected > 7 days.    NUTRITION DIAGNOSIS:   Inadequate oral intake related to altered GI function as evidenced by NPO status.  GOAL:   Provide needs based on ASPEN/SCCM guidelines  MONITOR:   Skin, I & O's, Vent status  REASON FOR ASSESSMENT:   Ventilator    ASSESSMENT:   Pt with pmhx significant for DM, HTN, OSA, and dementia who presents from her nursing home with abdominal distention and respiratory distress.   Abd xray shows SBO Spoke with RN at bedside.  > 2L out via NG K+ 3.3 and PO4 2.1 - receiving potassium phosphate IV  Diet Order:  Diet NPO time specified  Skin:  Wound (see comment) (DTI and MASD to buttocks)  Last BM:  unknown  Height:   Ht Readings from Last 1 Encounters:  12/08/16 5\' 4"  (1.626 m)    Weight:   Wt Readings from Last 1 Encounters:  12/09/16 217 lb 13 oz (98.8 kg)    Ideal Body Weight:  54.5 kg  BMI:  Body mass index is 37.39 kg/m.  Estimated Nutritional Needs:   Kcal:  [id number]  Protein:  >/= 109 grams protein  Fluid:  > 1.5 L/day  EDUCATION NEEDS:   No education needs identified at this time  Kendell BaneHeather Graziella Connery RD, LDN, CNSC [phone] Pager [phone] After Hours Pager

## 2016-12-09 NOTE — Progress Notes (Signed)
ABG collected  

## 2016-12-09 NOTE — Progress Notes (Signed)
Inpatient Diabetes Program Recommendations  AACE/ADA: New Consensus Statement on Inpatient Glycemic Control (2015)  Target Ranges:  Prepandial:   less than 140 mg/dL      Peak postprandial:   less than 180 mg/dL (1-2 hours)      Critically ill patients:  140 - 180 mg/dL   Lab Results  Component Value Date   GLUCAP 202 (H) 12/09/2016   HGBA1C 6.1 (H) 11/02/2013    Review of Glycemic Control:  Results for Adrienne [name], Adrienne [name] (MRN [id number]) as of 12/09/2016 09:04  Ref. Range 12/08/2016 21:50 12/08/2016 23:46 12/09/2016 04:05 12/09/2016 08:02  Glucose-Capillary Latest Ref Range: 65 - 99 mg/dL 409153 (H) 811182 (H) 914203 (H) 202 (H)    Diabetes history: Type 2 diabetes Outpatient Diabetes medications: Metformin 500 mg bid Current orders for Inpatient glycemic control:  Novolog sensitive q 4 hours   Inpatient Diabetes Program Recommendations:   Patient transitioned off insulin drip last PM however did not receive basal insulin.  Last insulin infusion rate was 2.4 units/hr.  Please consider adding Lantus 15 units daily.   Thanks, Beryl MeagerJenny Charon Akamine, RN, BC-ADM Inpatient Diabetes Coordinator Pager [phone] (8a-5p)

## 2016-12-09 NOTE — Progress Notes (Signed)
[facility] Progress Note     Subjective: Sedated on the vent - arouses to voice and pain NGT - 2150 cc/24 h; bedside cannister with ~300 cc light brown output  Low grade temp - 100.9 Sinus tachycardia - 107 bpm Lactate stable - 4.6 from 4.2   Objective: Vital signs in last 24 hours: Temp:  [99 F (37.2 C)-100.8 F (38.2 C)] 100.8 F (38.2 C) (01/25 0800) Pulse Rate:  [96-113] 96 (01/25 0827) Resp:  [16-32] 24 (01/25 1000) BP: (93-144)/(38-56) 109/55 (01/25 1000) SpO2:  [91 %-99 %] 97 % (01/25 1000) FiO2 (%):  [40 %-100 %] 40 % (01/25 1000) Weight:  [98.8 kg (217 lb 13 oz)-99.1 kg (218 lb 7.6 oz)] 98.8 kg (217 lb 13 oz) (01/25 0412) Last BM Date:  (unable to assess pt on vent)  Intake/Output from previous day: 01/24 0701 - 01/25 0700 In: 3094.1 [I.V.:2969.1; IV Piggyback:125] Out: 2560 [Urine:410; Emesis/NG output:2150] Intake/Output this shift: Total I/O In: 162.5 [I.V.:162.5] Out: 200 [Urine:200]  PE: Gen:  Elderly white female sedated on vent  Card:  Tachycardic, regular rhythm no M/G/R appreciated; pedal pulses 2+ and equal Pulm:  CTAB Abd: Obese, Soft, non-tender, +BS, large umbilical hernia that is easily reducible without overlying skin changes Ext:  No erythema, edema, or tenderness; no rashes   Lab Results:   Recent Labs  12/08/16 0250 12/08/16 0418 12/09/16 0724  WBC 10.8*  --  5.7  HGB 14.9 15.3* 11.3*  HCT 45.5 45.0 34.5*  PLT 229  --  156   BMET  Recent Labs  12/08/16 1605 12/09/16 0724  NA 141 142  K 3.8 3.3*  CL 105 97*  CO2 24 28  GLUCOSE 130* 214*  BUN 50* 58*  CREATININE 1.76* 1.77*  CALCIUM 7.8* 7.7*   PT/INR  Recent Labs  12/08/16 0922  LABPROT 17.8*  INR 1.45   CMP     Component Value Date/Time   NA 142 12/09/2016 0724   K 3.3 (L) 12/09/2016 0724   CL 97 (L) 12/09/2016 0724   CO2 28 12/09/2016 0724   GLUCOSE 214 (H) 12/09/2016 0724   BUN 58 (H) 12/09/2016 0724   CREATININE 1.77 (H) 12/09/2016 0724    CALCIUM 7.7 (L) 12/09/2016 0724   PROT 5.9 (L) 12/08/2016 1605   ALBUMIN 2.5 (L) 12/08/2016 1605   AST 37 12/08/2016 1605   ALT 22 12/08/2016 1605   ALKPHOS 28 (L) 12/08/2016 1605   BILITOT 1.3 (H) 12/08/2016 1605   GFRNONAA 26 (L) 12/09/2016 0724   GFRAA 31 (L) 12/09/2016 0724   Lipase     Component Value Date/Time   LIPASE 61 (H) 12/08/2016 0922   Studies/Results: Ct Abdomen Pelvis Wo Contrast  Result Date: 12/08/2016 CLINICAL DATA:  81 year old female with nausea vomiting. EXAM: CT ABDOMEN AND PELVIS WITHOUT CONTRAST TECHNIQUE: Multidetector CT imaging of the abdomen and pelvis was performed following the standard protocol without IV contrast. COMPARISON:  Abdominal radiograph dated 12/08/2016 abdominal CT dated 02/15/2006 FINDINGS: Evaluation of this exam is limited in the absence of intravenous contrast. Lower chest: Left lung base subsegmental atelectatic changes. Infiltrate is less likely. There is multi vessel coronary vascular calcification. No intra-abdominal free air or free fluid. Hepatobiliary: Diffuse fatty infiltration of the liver. The liver is mildly enlarged. There is a 2.0 x 1.7 cm focus of coarse calcification in the right lobe of the liver which is increased in size since the prior CT of 2007 likely sequela of prior inflammatory/infectious process. There  is no intrahepatic biliary ductal dilatation. There multiple stones within the gallbladder as well as in the gallbladder neck. No pericholecystic fluid. Pancreas: Unremarkable. No pancreatic ductal dilatation or surrounding inflammatory changes. Spleen: Normal in size without focal abnormality. Adrenals/Urinary Tract: Adrenal glands are unremarkable. Kidneys are normal, without renal calculi, focal lesion, or hydronephrosis. Bladder is unremarkable. Stomach/Bowel: The stomach is distended with fluid and gastric content. No evidence of gastric outlet obstruction. There is small amount of fluid in the distal esophagus likely  from gastroesophageal reflux. Multiple dilated and fluid-filled loops of small bowel noted measuring up to 5.3 cm in diameter. The distal and terminal ileum demonstrate a normal caliber. Focal areas of kinking noted involving multiple loops of small bowel within the pelvis possibly related to underlying adhesion. There is however no definite transition zone identified. The dilatation of the small bowel may be related to an ileus, however an obstruction is not entirely excluded. Clinical correlation and follow-up with small bowel serous recommended. There is extensive colonic diverticulosis without active inflammatory changes. Air is noted within the colon. The appendix is not visualized with certainty. No inflammatory changes identified in the right lower quadrant. Vascular/Lymphatic: There is advanced aortoiliac atherosclerotic disease. No portal venous gas identified. Evaluation of the vasculature is limited in the absence of intravenous contrast. There is no adenopathy. There is mild diffuse mesenteric stranding and engorgement of the mesentery vasculature. Reproductive: Hysterectomy.  The ovaries appear unremarkable. Other: There is a 3.2 cm peritoneal defect at the level of the umbilicus with a fat containing umbilical hernia. There is inflammatory changes of the herniated fat with fluid within the hernia suspicious for strangulation. Correlation with clinical exam is recommended to evaluate for incarceration. No drainable fluid collection identified. Musculoskeletal: Osteopenia with degenerative changes of the spine. There is sclerotic changes and compression deformity of the L2 vertebra as well as sclerotic changes of the L1, chronic appearing. Multilevel disc desiccation with vacuum phenomena noted. No acute fracture. IMPRESSION: Fat containing umbilical hernia with signs of incarceration/ strangulation. Clinical correlation is recommended. Dilated and fluid-filled loops of small bowel without definite focal  transition zone. Findings may represent a reactive ileus secondary to inflammatory changes of the umbilical hernia or less likely an ileus related to enteritis. A small-bowel obstruction is not excluded. Correlation with clinical exam and follow-up with small-bowel series recommended. Extensive colonic diverticulosis.  There is air within the colon. Fatty liver. Cholelithiasis. Electronically Signed   By: Elgie CollardArash  Radparvar M.D.   On: 12/08/2016 04:20   Dg Chest Port 1 View  Result Date: 12/09/2016 CLINICAL DATA:  Respiratory distress.  Shortness of breath . EXAM: PORTABLE CHEST 1 VIEW COMPARISON:  12/08/2016. FINDINGS: Endotracheal tube and NG tube in stable position. Heart size stable. Progressive bibasilar atelectasis and infiltrates. Small left pleural effusion. No pneumothorax. IMPRESSION: 1. Endotracheal tube and NG tube in stable position. 2. Progressive bibasilar atelectasis and infiltrates . Electronically Signed   By: Maisie Fushomas  Register   On: 12/09/2016 06:56   Dg Chest Portable 1 View  Result Date: 12/08/2016 CLINICAL DATA:  Intubation. EXAM: PORTABLE CHEST 1 VIEW COMPARISON:  12/08/2016 . FINDINGS: Endotracheal tube and NG tube in stable position. Cardiomegaly with mild bilateral interstitial prominence. A mild component congestive heart failure cannot be excluded. Interstitial pneumonitis cannot be excluded. Low lung volumes . No pleural effusion or pneumothorax . IMPRESSION: 1.  Endotracheal tube and NG tube noted good anatomic position. 2. Cardiomegaly with mild bilateral from interstitial prominence. Mild CHF cannot be excluded. Mild  pneumonitis cannot be excluded. Electronically Signed   By: Maisie Fus  Register   On: 12/08/2016 11:50   Dg Chest Port 1 View  Result Date: 12/08/2016 CLINICAL DATA:  81 year old female with shortness of breath. EXAM: PORTABLE CHEST 1 VIEW COMPARISON:  Chest radiograph 11/02/2013 FINDINGS: There are bibasilar and perihilar mild bronchiectatic changes and scarring.  No focal consolidation, pleural effusion, or pneumothorax. Stable mild cardiomegaly. No acute osseous pathology. IMPRESSION: No active disease. Electronically Signed   By: Elgie Collard M.D.   On: 12/08/2016 03:28   Dg Abd Portable 1v  Result Date: 12/08/2016 CLINICAL DATA:  Ileus.  Gastric tube placement. EXAM: PORTABLE ABDOMEN - 1 VIEW COMPARISON:  12/08/2016 FINDINGS: Image quality degraded by motion. NG tube in the stomach with the tip in the body the stomach. Improvement in bowel distention since the prior study. IMPRESSION: NG tube in the body the stomach Improved distention of bowel. Electronically Signed   By: Marlan Palau M.D.   On: 12/08/2016 07:36   Dg Abd Portable 1 View  Result Date: 12/08/2016 CLINICAL DATA:  Dyspnea, abdominal distention, concern for bowel perforation EXAM: PORTABLE ABDOMEN - 1 VIEW COMPARISON:  02/15/2006 FINDINGS: Dilated small bowel loops out of proportion nondistended appearing large bowel. Findings have the appearance of small bowel obstruction. No definite pneumoperitoneum although the study is compromised by patient body habitus. CT would be the optimal study of choice this correlation. IMPRESSION: Abnormal bowel gas pattern with gaseous dilatation of numerous small bowel loops out of proportion to large bowel. Findings have the appearance high-grade small bowel obstruction. It should be noted that the abdomen and pelvis are incompletely imaged/included on this study. No definite pneumoperitoneum. Electronically Signed   By: Tollie Eth M.D.   On: 12/08/2016 03:32    Anti-infectives: Anti-infectives    Start     Dose/Rate Route Frequency Ordered Stop   12/08/16 1200  piperacillin-tazobactam (ZOSYN) IVPB 3.375 g     3.375 g 12.5 mL/hr over 240 Minutes Intravenous Every 8 hours 12/08/16 0717     12/08/16 0345  vancomycin (VANCOCIN) IVPB 1000 mg/200 mL premix     1,000 mg 200 mL/hr over 60 Minutes Intravenous  Once 12/08/16 0333 12/08/16 0740   12/08/16  0345  piperacillin-tazobactam (ZOSYN) IVPB 3.375 g     3.375 g 100 mL/hr over 30 Minutes Intravenous  Once 12/08/16 1610 12/08/16 0741      Assessment/Plan Abdominal distention/nausea Umbilical hernia - reducible, soft, non-erythematous on exam Ileus v SBO - CT 12/08/16 significant for fat-containaining umbilical hernia w/ strangulation, dilated fluid-filled loops of SB (5.3 cm) w.o transition zone.  - lactate 4.6 today, 11.32 on admission - blood culture pending, urine cx negative to date   Acute hypoxic respiratory failure OSA Lactic acidosis  HTN HLD Renal dysfunction - SCr 1.77  FEN: NPO, IVF ID: Vanc 1/24 once, Zosyn 1/24 >>  VTE: subcutaneous heparin  Plan: high NGT output, continue NGT to LIWS. Repeat DG Abd pending. Benign abdominal exam and lactate stable - No acute surgical needs at this time      LOS: 1 day    Adam Phenix , [facility] 12/09/2016, 10:14 AM Pager: [phone] Consults: [phone] Mon-Fri 7:00 am-4:30 pm Sat-Sun 7:00 am-11:30 am

## 2016-12-09 NOTE — Care Management Note (Signed)
Case Management Note  Patient Details  Name: Adrienne [name] MRN: [id number] Date of Birth: 09-21-1936  Subjective/Objective:                 Admitted with abdominal distention. From [facility].    Action/Plan:   Expected Discharge Date:                  Expected Discharge Plan:  Skilled Nursing Facility  In-House Referral:  Clinical Social Work  Discharge planning Services  CM Consult  Post Acute Care Choice:    Choice offered to:     DME Arranged:    DME Agency:     HH Arranged:    HH Agency:     Status of Service:  In process, will continue to follow  If discussed at Long Length of Stay Meetings, dates discussed:    Additional Comments:  Lawerance SabalDebbie Aksh Swart, RN 12/09/2016, 9:55 AM

## 2016-12-09 NOTE — Consult Note (Signed)
WOC Nurse wound consult note Reason for Consult: sacrum Patient from SNF Wound type: MASD (moisture associated skin damage) and most likely pressure associated skin injury Pressure Injury POA: Yes Measurement:13cm x 11cm x 0.2cm dark, unblanchable tissue over the sacrum and buttocks with several scattered areas with partial thickness skin loss along the inner mid buttocks bilaterally.  Wound bed: partial thickness skin sloughing, ruddy Drainage (amount, consistency, odor) minimal, serosanguinous  Periwound: intact  Dressing procedure/placement/frequency: Continue silicone foam dressing for moisture management and protection, pressure redistribution SPORT mattress in place while in the ICU, add low air loss mattress if patient transfers out of ICU  Discussed POC with patient and bedside nurse.  Re consult if needed, will not follow at this time. Thanks  Krisanne Lich M.[employer] MSN, RN,CWOCN, CNS [phone])

## 2016-12-09 NOTE — Progress Notes (Addendum)
Name: Adrienne [name] MRN: [id number] DOB: 1936/04/26    LOS: 1  PCCM ADMISSION NOTE  History of Present Illness: Patient is a [name] with pmhx significant for DM, HTN, OSA, and dementia who presents from her nursing home with abdominal distention and respiratory distress. Patient's daughter was called by the nursing home saying that patient's abdomen was distended and that she has one episode of emesis overnight. She was brought in to the ED for evaluation. History was limited due to baseline dementia and daughter was unable to provide much information. Patient was able to endorsed some intermittent fevers and SOB for the past few days. She also says she has been nauseous with abdominal pain. Last BM 3-4 days ago. Unable to perform complete ROS.    Lines / Drains: PIVs  Cultures: Blood 1/24 >> Urine 1/24 >>  Antibiotics: Vanc 1/24>1/24 (x1 in ED) Zosyn 1/24>>  Tests / Events: Admit 1/24   Subjective: Lactic acid trended down, stable at 4. Awaiting repeat. Fever 100.8 overnight. Vitals stable. Awakens to voice.   Vital Signs: Temp:  [98.8 F (37.1 C)-100.8 F (38.2 C)] 99.3 F (37.4 C) (01/25 0400) Pulse Rate:  [42-113] 106 (01/25 0600) Resp:  [16-35] 27 (01/25 0600) BP: (93-156)/(38-103) 117/41 (01/25 0600) SpO2:  [89 %-99 %] 98 % (01/25 0600) FiO2 (%):  [50 %-100 %] 50 % (01/25 0344) Weight:  [217 lb 13 oz (98.8 kg)-218 lb 7.6 oz (99.1 kg)] 217 lb 13 oz (98.8 kg) (01/25 0412) I/O last 3 completed shifts: In: 4961.6 [I.V.:3836.6; IV Piggyback:1125] Out: 0923 [Urine:410; Emesis/NG output:2150; Other:900]  Physical Examination: General:  Elderly obese woman sedated on vent Neuro:  Sedated, awakens to voice, not following commands due to sedation, moves extremities spontaneously HEENT:  ETT in place, sclera anicteric Cardiovascular: Tachycardic but regular, no m/g/r Lungs: CTA bilaterally, breaths synched with vent  Abdomen:  BS hypoactive, distended, non-tender.  Umbilical hernia that is soft and reducible. Extremities:  Warm and well perfused, no edema  Skin:  No rashes or erythema   Ventilator settings: Vent Mode: PRVC FiO2 (%):  [50 %-100 %] 50 % Set Rate:  [26 bmp-30 bmp] 30 bmp Vt Set:  [450 mL] 450 mL PEEP:  [5 cmH20-10 cmH20] 10 cmH20 Plateau Pressure:  [17 cmH20-18 cmH20] 18 cmH20  Labs and Imaging:   CXR 1/24 >> negative  Abdominal Xray 1/24 >> high grade SBO, no definite pneumoperitoneum  CT abdomen pelvis 1/24 >> fat containing umbilical hernia with incarceration/strangulation, also with findings concerning for ileus vs SBO   Assessment and Plan:  PULMONARY  ASSESSMENT: Acute hypoxic respiratory failure - secondary to SBO and metabolic acidosis  Tachypnea, likely compensating  Hx OSA  PLAN:   Full vent support WUA/SBT Decrease RR Repeat ABG Improve underlying acidosis, now alk, reduce rate   CARDIOVASCULAR  ASSESSMENT:  Sinus Tach  Hemodynamically stable  Hx HLD Hx HTN PLAN:  Hold home antihypertensives  Hold home ASA    RENAL  ASSESSMENT:   Renal dysfunction (unclear baseline or chronicity)  Anion gap metabolic acidosis - secondary to lactic acidosis  PLAN:   Follow lactic acid Bicarb gtt in D5W- dc Follow BMP  Avoid nephrotoxic meds  Goal potassium > 4 and mag > 2  GASTROINTESTINAL  ASSESSMENT:   SBO vs. Ileus vs ischemic bowel Umbilical hernia, reducible on exam  PLAN:   NPO  Follow electrolytes closely- awaiting AM labs  Trend lactate  Surgery consulted; appreciate recs  Serial abdominal exams  HEMATOLOGIC  ASSESSMENT:   No acute issues PLAN:  Follow CBC  Sub q heparin ppx   INFECTIOUS  ASSESSMENT:   Lactic acidosis  Possible enteritis  Febrile overnight-mild  PLAN:   Follow cultures  Zosyn 1/24 >>  Trend WBC and fever curves  ENDOCRINE  ASSESSMENT:   Hyperglycemia Hx DM   PLAN:   Insulin drip  CBGs q4h  NEUROLOGIC  ASSESSMENT:   Sedation for ETT PLAN:   Rass  goal 0  Versed prn Fentanyl gtt  Family Update: Daughter updated 1/24   Albin Felling, MD, MPH Internal Medicine Resident, PGY-III Pager: [phone] 12/09/2016, 7:14 AM   STAFF NOTE: Linwood Dibbles, MD FACP have personally reviewed patient's available data, including medical history, events of note, physical examination and test results as part of my evaluation. I have discussed with resident/NP and other care providers such as pharmacist, RN and RRT. In addition, I personally evaluated patient and elicited key findings of: FC, rass 0 , abdo remains soft but distended, lungs coarse, pcxr mild int prominence ans atx, lactic remains at 4.6, abdo is source, no pressors, question is ileus / sbo vs colon ischemia  And to what degree, will communicate to ccs about lactic and follow recs, ABG reviewed, reduce rate 14, dc bicarb drip, supp K , can consider SBT today , unlikely to extubate, chem in am, continued pos balance, maintain abx, low threshold add vanc if declines, would NOT feed, pao2 high, to peep 5 The patient is critically ill with multiple organ systems failure and requires high complexity decision making for assessment and support, frequent evaluation and titration of therapies, application of advanced monitoring technologies and extensive interpretation of multiple databases.   Critical Care Time devoted to patient care services described in this note is30  Minutes. This time reflects time of care of this signee: Merrie Roof, MD FACP. This critical care time does not reflect procedure time, or teaching time or supervisory time of PA/NP/Med student/Med Resident etc but could involve care discussion time. Rest per NP/medical resident whose note is outlined above and that I agree with   Lavon Paganini. Titus Mould, MD, [place] Pgr: [facility] 12/09/2016 8:32 AM

## 2016-12-10 ENCOUNTER — Inpatient Hospital Stay (HOSPITAL_COMMUNITY): Payer: Medicare Other

## 2016-12-10 DIAGNOSIS — Z9911 Dependence on respirator [ventilator] status: Secondary | ICD-10-CM

## 2016-12-10 DIAGNOSIS — K56609 Unspecified intestinal obstruction, unspecified as to partial versus complete obstruction: Secondary | ICD-10-CM

## 2016-12-10 LAB — GLUCOSE, CAPILLARY
GLUCOSE-CAPILLARY: 174 mg/dL — AB (ref 65–99)
GLUCOSE-CAPILLARY: 130 mg/dL — AB (ref 65–99)
GLUCOSE-CAPILLARY: 96 mg/dL (ref 65–99)
Glucose-Capillary: 125 mg/dL — ABNORMAL HIGH (ref 65–99)
Glucose-Capillary: 154 mg/dL — ABNORMAL HIGH (ref 65–99)
Glucose-Capillary: 168 mg/dL — ABNORMAL HIGH (ref 65–99)

## 2016-12-10 LAB — BLOOD GAS, ARTERIAL
ACID-BASE EXCESS: 11.4 mmol/L — AB (ref 0.0–2.0)
Bicarbonate: 35 mmol/L — ABNORMAL HIGH (ref 20.0–28.0)
DRAWN BY: 31101
FIO2: 40
MECHVT: 450 mL
O2 Saturation: 99.4 %
PEEP/CPAP: 5 cmH2O
PH ART: 7.538 — AB (ref 7.350–7.450)
Patient temperature: 98.6
RATE: 14 resp/min
pCO2 arterial: 41.2 mmHg (ref 32.0–48.0)
pO2, Arterial: 187 mmHg — ABNORMAL HIGH (ref 83.0–108.0)

## 2016-12-10 LAB — BASIC METABOLIC PANEL
Anion gap: 10 (ref 5–15)
BUN: 42 mg/dL — AB (ref 6–20)
CALCIUM: 8 mg/dL — AB (ref 8.9–10.3)
CO2: 33 mmol/L — AB (ref 22–32)
Chloride: 103 mmol/L (ref 101–111)
Creatinine, Ser: 1.18 mg/dL — ABNORMAL HIGH (ref 0.44–1.00)
GFR calc Af Amer: 50 mL/min — ABNORMAL LOW (ref >60)
GFR calc non Af Amer: 43 mL/min — ABNORMAL LOW (ref >60)
GLUCOSE: 152 mg/dL — AB (ref 65–99)
Potassium: 3.1 mmol/L — ABNORMAL LOW (ref 3.5–5.1)
Sodium: 146 mmol/L — ABNORMAL HIGH (ref 135–145)

## 2016-12-10 LAB — CBC
HEMATOCRIT: 34.7 % — AB (ref 36.0–46.0)
Hemoglobin: 11.1 g/dL — ABNORMAL LOW (ref 12.0–15.0)
MCH: 31.3 pg (ref 26.0–34.0)
MCHC: 32 g/dL (ref 30.0–36.0)
MCV: 97.7 fL (ref 78.0–100.0)
Platelets: 180 10*3/uL (ref 150–400)
RBC: 3.55 MIL/uL — ABNORMAL LOW (ref 3.87–5.11)
RDW: 15 % (ref 11.5–15.5)
WBC: 7.3 10*3/uL (ref 4.0–10.5)

## 2016-12-10 LAB — LACTIC ACID, PLASMA: Lactic Acid, Venous: 1.7 mmol/L (ref 0.5–1.9)

## 2016-12-10 LAB — TRIGLYCERIDES: TRIGLYCERIDES: 166 mg/dL — AB (ref <150)

## 2016-12-10 LAB — PHOSPHORUS: Phosphorus: 3.2 mg/dL (ref 2.5–4.6)

## 2016-12-10 LAB — MAGNESIUM: Magnesium: 2.5 mg/dL — ABNORMAL HIGH (ref 1.7–2.4)

## 2016-12-10 MED ORDER — DIATRIZOATE MEGLUMINE & SODIUM 66-10 % PO SOLN
90.0000 mL | Freq: Once | ORAL | Status: AC
  Administered 2016-12-10: 90 mL via NASOGASTRIC
  Filled 2016-12-10: qty 90

## 2016-12-10 MED ORDER — FENTANYL CITRATE (PF) 100 MCG/2ML IJ SOLN: 50.0000 ug | INTRAMUSCULAR | Status: DC | PRN

## 2016-12-10 MED ORDER — SODIUM CHLORIDE 0.9 % IV SOLN
30.0000 meq | Freq: Once | INTRAVENOUS | Status: AC
  Administered 2016-12-10: 30 meq via INTRAVENOUS
  Filled 2016-12-10: qty 15

## 2016-12-10 MED ORDER — PROPOFOL 1000 MG/100ML IV EMUL
0.0000 ug/kg/min | INTRAVENOUS | Status: DC
  Administered 2016-12-10: 20 ug/kg/min via INTRAVENOUS
  Administered 2016-12-11 (×2): 15 ug/kg/min via INTRAVENOUS
  Filled 2016-12-10 (×3): qty 100

## 2016-12-10 NOTE — Progress Notes (Signed)
[facility] Progress Note     Subjective: On vent, Will arouse to voice and pain. Afebrile overnight. NGT output 825 in last 24h  Objective: Vital signs in last 24 hours: Temp:  [98.5 F (36.9 C)-99.2 F (37.3 C)] 99.2 F (37.3 C) (01/26 0300) Pulse Rate:  [83-97] 85 (01/26 0852) Resp:  [13-24] 13 (01/26 0852) BP: (105-153)/(41-63) 153/63 (01/26 0852) SpO2:  [94 %-98 %] 95 % (01/26 0852) FiO2 (%):  [30 %-40 %] 40 % (01/26 0852) Weight:  [213 lb 3 oz (96.7 kg)] 213 lb 3 oz (96.7 kg) (01/26 0500) Last BM Date:  (PTA )  Intake/Output from previous day: 01/25 0701 - 01/26 0700 In: 647.9 [I.V.:397.9; IV Piggyback:250] Out: 3150 [Urine:2325; Emesis/NG output:825] Intake/Output this shift: Total I/O In: -  Out: 250 [Urine:250]  PE: Gen:  Sedated on vent, white elderly woman, will arouse to voice Card:  RRR, mild systolic murmur noted Pulm:  on vent Abd: Obese, Soft, hypoactive BS, large umbilical hernia that is easily reducible Skin: not diaphoretic, warm and dry  Lab Results:   Recent Labs  12/09/16 0724 12/10/16 0337  WBC 5.7 7.3  HGB 11.3* 11.1*  HCT 34.5* 34.7*  PLT 156 180   BMET  Recent Labs  12/09/16 0724 12/10/16 0337  NA 142 146*  K 3.3* 3.1*  CL 97* 103  CO2 28 33*  GLUCOSE 214* 152*  BUN 58* 42*  CREATININE 1.77* 1.18*  CALCIUM 7.7* 8.0*   PT/INR  Recent Labs  12/08/16 0922  LABPROT 17.8*  INR 1.45   CMP     Component Value Date/Time   NA 146 (H) 12/10/2016 0337   K 3.1 (L) 12/10/2016 0337   CL 103 12/10/2016 0337   CO2 33 (H) 12/10/2016 0337   GLUCOSE 152 (H) 12/10/2016 0337   BUN 42 (H) 12/10/2016 0337   CREATININE 1.18 (H) 12/10/2016 0337   CALCIUM 8.0 (L) 12/10/2016 0337   PROT 5.9 (L) 12/08/2016 1605   ALBUMIN 2.5 (L) 12/08/2016 1605   AST 37 12/08/2016 1605   ALT 22 12/08/2016 1605   ALKPHOS 28 (L) 12/08/2016 1605   BILITOT 1.3 (H) 12/08/2016 1605   GFRNONAA 43 (L) 12/10/2016 0337   GFRAA 50 (L) 12/10/2016  0337   Lipase     Component Value Date/Time   LIPASE 61 (H) 12/08/2016 0922       Studies/Results: Dg Chest Port 1 View  Result Date: 12/10/2016 CLINICAL DATA:  Ventilator dependent EXAM: PORTABLE CHEST 1 VIEW COMPARISON:  12/09/2016 FINDINGS: Endotracheal tube and NG tube remain in place, unchanged. Low lung volumes with bibasilar atelectasis. Mild cardiomegaly. Aeration has improved since prior study. No visible effusions. IMPRESSION: Low lung volumes with bibasilar atelectasis, improving since prior study. Electronically Signed   By: Charlett Nose M.D.   On: 12/10/2016 08:54   Dg Chest Port 1 View  Result Date: 12/09/2016 CLINICAL DATA:  Respiratory distress.  Shortness of breath . EXAM: PORTABLE CHEST 1 VIEW COMPARISON:  12/08/2016. FINDINGS: Endotracheal tube and NG tube in stable position. Heart size stable. Progressive bibasilar atelectasis and infiltrates. Small left pleural effusion. No pneumothorax. IMPRESSION: 1. Endotracheal tube and NG tube in stable position. 2. Progressive bibasilar atelectasis and infiltrates . Electronically Signed   By: Maisie Fus  Register   On: 12/09/2016 06:56   Dg Chest Portable 1 View  Result Date: 12/08/2016 CLINICAL DATA:  Intubation. EXAM: PORTABLE CHEST 1 VIEW COMPARISON:  12/08/2016 . FINDINGS: Endotracheal tube and NG tube in stable  position. Cardiomegaly with mild bilateral interstitial prominence. A mild component congestive heart failure cannot be excluded. Interstitial pneumonitis cannot be excluded. Low lung volumes . No pleural effusion or pneumothorax . IMPRESSION: 1.  Endotracheal tube and NG tube noted good anatomic position. 2. Cardiomegaly with mild bilateral from interstitial prominence. Mild CHF cannot be excluded. Mild pneumonitis cannot be excluded. Electronically Signed   By: Maisie Fushomas  Register   On: 12/08/2016 11:50   Dg Abd Portable 1v  Result Date: 12/09/2016 CLINICAL DATA:  Small-bowel obstruction EXAM: PORTABLE ABDOMEN - 1 VIEW  COMPARISON:  12/08/2016 FINDINGS: NG tube in the stomach. Moderately severe small bowel dilatation consistent with small-bowel obstruction. Decreased small bowel gas following NG decompression. No colonic gas. IMPRESSION: Small-bowel obstruction pattern. Decreased small bowel gas and distention following NG decompression. Electronically Signed   By: Marlan Palauharles  Clark M.D.   On: 12/09/2016 13:21    Anti-infectives: Anti-infectives    Start     Dose/Rate Route Frequency Ordered Stop   12/08/16 1200  piperacillin-tazobactam (ZOSYN) IVPB 3.375 g     3.375 g 12.5 mL/hr over 240 Minutes Intravenous Every 8 hours 12/08/16 0717     12/08/16 0345  vancomycin (VANCOCIN) IVPB 1000 mg/200 mL premix     1,000 mg 200 mL/hr over 60 Minutes Intravenous  Once 12/08/16 0333 12/08/16 0740   12/08/16 0345  piperacillin-tazobactam (ZOSYN) IVPB 3.375 g     3.375 g 100 mL/hr over 30 Minutes Intravenous  Once 12/08/16 [id number] 12/08/16 0741       Assessment/Plan Abdominal distention/nausea Umbilical hernia - reducible, soft, non-erythematous on exam Ileus v SBO - CT 12/08/16 significant for fat-containaining umbilical hernia w/ strangulation, dilated fluid-filled loops of SB (5.3 cm) w.o transition zone.  - lactate 4.6 yesterday, 11.32 on admission - blood culture no growth 1 day, urine cx negative to date   Acute hypoxic respiratory failure OSA Lactic acidosis  HTN HLD Renal dysfunction - SCr 1.77  FEN: NPO, IVF ID: Vanc 1/24 once, Zosyn 1/24 >>  VTE: subcutaneous heparin  Plan: high NGT output, continue NGT to LIWS. Repeat DG Abd film showed Small-bowel obstruction pattern. Decreased small bowel gas and distention following NG decompression  Benign abdominal exam and lactate stable - No acute surgical needs at this time. We will continue to follow.   LOS: 2 days    Jerre SimonJessica L Erine Phenix , [facility] 12/10/2016, 9:44 AM Pager: [phone] Consults: [phone] Mon-Fri 7:00 am-4:30  pm Sat-Sun 7:00 am-11:30 am

## 2016-12-10 NOTE — Significant Event (Signed)
Wasted approximately 75cc of fentanyl in sink-witnessed by Mahalia Longestrystal Atkins, RN.        Alwyn PeaKSOR, Jones SkeneHYIU

## 2016-12-10 NOTE — Progress Notes (Signed)
   12/10/16 1300  Clinical Encounter Type  Visited With Patient and family together  Visit Type Spiritual support  Spiritual Encounters  Spiritual Needs Ritual  Stress Factors  Patient Stress Factors None identified  Family Stress Factors None identified  Introduction to Pt and daughter. Offered prayer and anointing to an Orthodox Christian.

## 2016-12-10 NOTE — Progress Notes (Signed)
[facility] LAB REPLACEMENT ONLY  [facility] based on the criteria listed below:   1. Is GFR >/= 40 ml/min? Yes.    Patient's GFR today is 43 2. Is urine output >/= 0.5 ml/kg/hr for the last 6 hours? Yes.   Patient's UOP is 1.28 ml/kg/hr 3. Is BUN < 60 mg/dL? Yes.    Patient's BUN today is 42 4. Abnormal electrolyte(s): Potassium 3.1 5. Ordered repletion with: Potassium per protocol  Clydette Privitera P 12/10/2016 6:27 AM

## 2016-12-10 NOTE — Progress Notes (Signed)
Name: Adrienne [name] MRN: [id number] DOB: June 12, 1936    LOS: 2  PCCM ADMISSION NOTE  History of Present Illness: Patient is a [name] with pmhx significant for DM, HTN, OSA, and dementia who presents from her nursing home with abdominal distention and respiratory distress. Patient's daughter was called by the nursing home saying that patient's abdomen was distended and that she has one episode of emesis overnight. She was brought in to the ED for evaluation. History was limited due to baseline dementia and daughter was unable to provide much information. Patient was able to endorsed some intermittent fevers and SOB for the past few days. She also says she has been nauseous with abdominal pain. Last BM 3-4 days ago. Unable to perform complete ROS.    Lines / Drains: PIVs  Cultures: Blood 1/24 >> Urine 1/24 >> no growth  Antibiotics: Vanc 1/24>1/24 (x1 in ED) Zosyn 1/24>>  Tests / Events: Admit 1/24   Subjective: Remains hemodynamically stable. No additional fevers over last 24 hours. Awakens to voice.   Vital Signs: Temp:  [98.5 F (36.9 C)-100.8 F (38.2 C)] 99.2 F (37.3 C) (01/26 0300) Pulse Rate:  [83-97] 83 (01/26 0300) Resp:  [17-30] 17 (01/26 0300) BP: (105-139)/(41-60) 110/47 (01/26 0300) SpO2:  [94 %-99 %] 98 % (01/26 0300) FiO2 (%):  [30 %-50 %] 40 % (01/26 0300) Weight:  [213 lb 3 oz (96.7 kg)] 213 lb 3 oz (96.7 kg) (01/26 0500) I/O last 3 completed shifts: In: 2590.3 [I.V.:2265.3; IV Piggyback:325] Out: 3810 [Urine:2735; Emesis/NG output:1075]  Physical Examination: General:  Elderly obese woman sedated on vent Neuro:  Sedated, awakens to voice, not following commands, moves extremities spontaneously HEENT:  ETT in place, sclera anicteric Cardiovascular: RRR, 2/6 systolic murmur heard best at RUSB Lungs: CTA bilaterally, breaths synched with vent  Abdomen:  BS hypoactive, distended, non-tender. Umbilical hernia that is soft and reducible. Extremities:   Warm and well perfused, no edema  Skin:  No rashes or erythema   Ventilator settings: Vent Mode: PRVC FiO2 (%):  [30 %-50 %] 40 % Set Rate:  [14 bmp] 14 bmp Vt Set:  [450 mL] 450 mL PEEP:  [5 cmH20-10 cmH20] 5 cmH20 Pressure Support:  [8 cmH20] 8 cmH20 Plateau Pressure:  [15 cmH20-18 cmH20] 15 cmH20  Labs and Imaging:   CXR 1/24 >> negative  Abdominal Xray 1/24 >> high grade SBO, no definite pneumoperitoneum  CT abdomen pelvis 1/24 >> fat containing umbilical hernia with incarceration/strangulation, also with findings concerning for ileus vs SBO   Assessment and Plan:  PULMONARY  ASSESSMENT: Acute hypoxic respiratory failure - secondary to SBO and metabolic acidosis  Tachypnea, likely compensating> improved Hx OSA  PLAN:   Full vent support WUA/SBT ABG- 7.53/41/187, alkalotic, reduce rate  CARDIOVASCULAR  ASSESSMENT:  Sinus Tach> resolving  Hemodynamically stable  Hx HLD Hx HTN PLAN:  Hold home antihypertensives  Hold home ASA    RENAL  ASSESSMENT:   Renal dysfunction (unclear baseline or chronicity), improving  Anion gap metabolic acidosis - secondary to lactic acidosis > resolved Hypokalemia PLAN:   Follow BMP  Avoid nephrotoxic meds  K repleted Goal potassium > 4 and mag > 2  GASTROINTESTINAL  ASSESSMENT:   SBO vs. Ileus vs ischemic bowel- seems more SBO Umbilical hernia, reducible on exam  PLAN:   NPO  Hold off on TFs Keep NGT- 825 cc over last 24 hours Follow electrolytes closely, replace Surgery consulted; appreciate recs  Serial abdominal exams  F/u KUB  HEMATOLOGIC  ASSESSMENT:   No acute issues PLAN:  Follow CBC  Sub q heparin ppx   INFECTIOUS  ASSESSMENT:   Lactic acidosis  Possible enteritis  PLAN:   Follow cultures  Zosyn 1/24 >>  Trend WBC and fever curves  ENDOCRINE  ASSESSMENT:   Hyperglycemia Hx DM   PLAN:   SSI CBGs q4h  NEUROLOGIC  ASSESSMENT:   Sedation for ETT PLAN:   Rass goal 0  Versed  prn Fentanyl gtt  Family Update: Daughter updated 1/25   Rich Numberarly Rivet, MD, MPH Internal Medicine Resident, PGY-III Pager: [phone] 12/10/2016, 7:29 AM

## 2016-12-11 ENCOUNTER — Inpatient Hospital Stay (HOSPITAL_COMMUNITY): Payer: Medicare Other

## 2016-12-11 DIAGNOSIS — J9601 Acute respiratory failure with hypoxia: Secondary | ICD-10-CM

## 2016-12-11 DIAGNOSIS — L8990 Pressure ulcer of unspecified site, unspecified stage: Secondary | ICD-10-CM | POA: Insufficient documentation

## 2016-12-11 LAB — GLUCOSE, CAPILLARY
GLUCOSE-CAPILLARY: 159 mg/dL — AB (ref 65–99)
Glucose-Capillary: 107 mg/dL — ABNORMAL HIGH (ref 65–99)
Glucose-Capillary: 111 mg/dL — ABNORMAL HIGH (ref 65–99)
Glucose-Capillary: 135 mg/dL — ABNORMAL HIGH (ref 65–99)
Glucose-Capillary: 136 mg/dL — ABNORMAL HIGH (ref 65–99)
Glucose-Capillary: 141 mg/dL — ABNORMAL HIGH (ref 65–99)
Glucose-Capillary: 167 mg/dL — ABNORMAL HIGH (ref 65–99)

## 2016-12-11 LAB — BASIC METABOLIC PANEL
Anion gap: 11 (ref 5–15)
BUN: 28 mg/dL — AB (ref 6–20)
CALCIUM: 8.3 mg/dL — AB (ref 8.9–10.3)
CO2: 31 mmol/L (ref 22–32)
Chloride: 106 mmol/L (ref 101–111)
Creatinine, Ser: 1.04 mg/dL — ABNORMAL HIGH (ref 0.44–1.00)
GFR calc Af Amer: 58 mL/min — ABNORMAL LOW (ref >60)
GFR, EST NON AFRICAN AMERICAN: 50 mL/min — AB (ref >60)
GLUCOSE: 125 mg/dL — AB (ref 65–99)
POTASSIUM: 3.1 mmol/L — AB (ref 3.5–5.1)
SODIUM: 148 mmol/L — AB (ref 135–145)

## 2016-12-11 LAB — MAGNESIUM: MAGNESIUM: 2.3 mg/dL (ref 1.7–2.4)

## 2016-12-11 LAB — PHOSPHORUS: PHOSPHORUS: 3.3 mg/dL (ref 2.5–4.6)

## 2016-12-11 MED ORDER — METOPROLOL TARTRATE 5 MG/5ML IV SOLN
2.5000 mg | Freq: Four times a day (QID) | INTRAVENOUS | Status: DC
Start: 2016-12-11 — End: 2016-12-12
  Administered 2016-12-11 – 2016-12-12 (×6): 2.5 mg via INTRAVENOUS
  Filled 2016-12-11 (×6): qty 5

## 2016-12-11 MED ORDER — ORAL CARE MOUTH RINSE
15.0000 mL | Freq: Two times a day (BID) | OROMUCOSAL | Status: DC
  Administered 2016-12-11 – 2017-01-02 (×34): 15 mL via OROMUCOSAL

## 2016-12-11 MED ORDER — POTASSIUM CHLORIDE 20 MEQ/15ML (10%) PO SOLN
40.0000 meq | ORAL | Status: AC
  Administered 2016-12-11 (×2): 40 meq
  Filled 2016-12-11 (×2): qty 30

## 2016-12-11 MED ORDER — MORPHINE SULFATE (PF) 2 MG/ML IV SOLN
1.0000 mg | INTRAVENOUS | Status: DC | PRN
  Administered 2016-12-11 – 2016-12-15 (×13): 2 mg via INTRAVENOUS
  Administered 2016-12-16: 1 mg via INTRAVENOUS
  Filled 2016-12-11 (×15): qty 1

## 2016-12-11 MED ORDER — KCL IN DEXTROSE-NACL 40-5-0.45 MEQ/L-%-% IV SOLN
INTRAVENOUS | Status: DC
  Administered 2016-12-11 (×2): via INTRAVENOUS
  Filled 2016-12-11 (×3): qty 1000

## 2016-12-11 NOTE — Procedures (Signed)
Extubation Procedure Note  Patient Details:   Name: Adrienne [name] DOB: April 29, 1936 MRN: [id number]   Airway Documentation:     Evaluation  O2 sats: stable throughout Complications: No apparent complications Patient did tolerate procedure well. Bilateral Breath Sounds: Diminished, Rhonchi   No  Patient tolerated wean. MD ordered to extubate. Positive for cuff leak. Patient extubated to a 5 Lpm nasal cannula. No signs of dyspnea or stridor. Patient resting comfortable. RN at bedside.   Ancil BoozerSmallwood, Shawnelle Spoerl 12/11/2016, 11:01 AM

## 2016-12-11 NOTE — Progress Notes (Signed)
Pharmacy Antibiotic Note  Adrienne [name] is a 81 y.o. female admitted on 12/08/2016 with intra-abd infection.  Pharmacy has been consulted for Zosyn dosing.  Plan: Zosyn 3.375gm IV q8h - doses over 4 hours Will f/u micro data, renal function, and pt's clinical condition LOT goals and plan  Height: 5\' 4"  (162.6 cm) Weight: 211 lb 6.7 oz (95.9 kg) IBW/kg (Calculated) : 54.7  Temp (24hrs), Avg:99.6 F (37.6 C), Min:99.1 F (37.3 C), Max:100.1 F (37.8 C)   Recent Labs Lab 12/08/16 0250  12/08/16 0418  12/08/16 0653 12/08/16 1605 12/08/16 2127 12/09/16 0724 12/10/16 0337 12/10/16 1123 12/11/16 0325  WBC 10.8*  --   --   --   --   --   --  5.7 7.3  --   --   CREATININE 2.01*  --  1.80*  --   --  1.76*  --  1.77* 1.18*  --  1.04*  LATICACIDVEN  --   < >  --   < > 8.17* 4.3* 4.2* 4.6*  --  1.7  --   < > = values in this interval not displayed.  Estimated Creatinine Clearance: 50.1 mL/min (by C-G formula based on SCr of 1.04 mg/dL (H)).    Allergies  Allergen Reactions  . Prednisone Shortness Of Breath  . Ibuprofen Other (See Comments)    Swelling mouth and lips  . Meclizine Other (See Comments)    HALLUCINATIONS  . Sulfonamide Derivatives     REACTION: Reaction not known    Antimicrobials this admission: 1/24 Vanc *1 1/24 Zosyn >>   Microbiology results: 1/24 BCx x2: ngtd 1/24 UCx: ngtd  Thank you for allowing pharmacy to be a part of this patient's care.  Christoper Fabianaron Amend, PharmD, BCPS Clinical pharmacist, pager [phone] 12/11/2016 8:21 AM

## 2016-12-11 NOTE — Progress Notes (Signed)
eLink Physician-Brief Progress Note Patient Name: Adrienne [name] DOB: [birth year] MRN: [id number]   Date of Service  12/11/2016  HPI/Events of Note  Hypokalemia  eICU Interventions  Potassium replaced     Intervention Category Intermediate Interventions: Electrolyte abnormality - evaluation and management  Kai Railsback 12/11/2016, 5:15 AM

## 2016-12-11 NOTE — Progress Notes (Signed)
Name: Adrienne [name] MRN: [id number] DOB: [birth year]    LOS: 3 DATE OF ADMISSION: 12/11/2016  PCCM PROGRESS NOTE   BRIEF: 81 y/o female with multiple medical problems admitted with dementia from SNF with ileus vs SBO, high lactic acid.  She was intubated on 1/24.    Lines / Drains: PIVs ETT 1/24 >   Cultures: Blood 1/24 >> Urine 1/24 >> no growth  Antibiotics: Vanc 1/24>1/24 (x1 in ED) Zosyn 1/24>>  Tests / Events: Admit 1/24   Subjective: More awake today Following commands for me   Vital Signs: Temp:  [99.1 F (37.3 C)-100.1 F (37.8 C)] 99.1 F (37.3 C) (01/27 0400) Pulse Rate:  [14-85] 71 (01/27 0511) Resp:  [7-21] 17 (01/27 0511) BP: (96-153)/(42-69) 147/53 (01/27 0511) SpO2:  [92 %-98 %] 97 % (01/27 0511) FiO2 (%):  [40 %-50 %] 40 % (01/27 0315) Weight:  [211 lb 6.7 oz (95.9 kg)] 211 lb 6.7 oz (95.9 kg) (01/27 0414) I/O last 3 completed shifts: In: 1212.2 [I.V.:407.3; NG/GT:90; IV Piggyback:714.9] Out: 3725 [Urine:3425; Emesis/NG output:300]  Physical Examination: General: sedated on vent HENT: NCAT ETT PULM: Rhonchi bilaterally, vent supported breathing CV: Tachy, regular, no mgtr GI: minimal bowel sounds, distended Neuro: sedated awakes to voice, becomes agitated and moves all four extremities  Ventilator settings: Vent Mode: PRVC FiO2 (%):  [40 %-50 %] 40 % Set Rate:  [14 bmp] 14 bmp Vt Set:  [450 mL] 450 mL PEEP:  [5 cmH20] 5 cmH20 Pressure Support:  [8 cmH20] 8 cmH20 Plateau Pressure:  [16 cmH20-18 cmH20] 16 cmH20  Labs and Imaging:   CXR 1/24 >> negative  Abdominal Xray 1/24 >> high grade SBO, no definite pneumoperitoneum  CT abdomen pelvis 1/24 >> fat containing umbilical hernia with incarceration/strangulation, also with findings concerning for ileus vs SBO  KUB 1/26 > continued small bowel loop dilation but improved  Assessment and Plan:  PULMONARY  ASSESSMENT: Acute respiratory failure with hypoxemia > improved ventilatory  mechanics but distended abdomen pushing up on thorax OSA  PLAN:   Wean vent today> pressure support Consider extubation if vent mechanics acceptable on PSV VAP prevention  CARDIOVASCULAR  ASSESSMENT:  Hypertension PLAN:  Add low dose metoprolol IV Tele monitoring   RENAL  ASSESSMENT:   AKI improving Lactic acidosis resolved hypokalemia PLAN:   Monitor BMET and UOP Replace electrolytes as needed Change IV fluids to D5 1/2 NS with 40mEq KCL at 75cc/hr  GASTROINTESTINAL  ASSESSMENT:   SBO vs ileus> still minimal bowel sounds but KUB improving PLAN:   Appreciate surgery support Replace electrolytes Continue NG No meds via tube  HEMATOLOGIC  ASSESSMENT:   No acute issues PLAN:  Monitor for bleeding Continue DVT prophylaxis   INFECTIOUS  ASSESSMENT:   Enteritis? doubt PLAN:   Stop antibiotics Monitor cultures  ENDOCRINE  ASSESSMENT:   Hyperglycemia  PLAN:   Continue SSI and CBG q4h  NEUROLOGIC  ASSESSMENT:   Sedation for ETT Dementia PLAN:   Minimize sedation Continue propofol, low dose  Goal extubate 1/27  Family Update: Daughter updated 1/27 by phone  My cc time 33 minutes  Heber CarolinaBrent McQuaid, MD [facility] Pager: [phone] Cell: [phone] After 3pm or if no response, call [phone] 12/11/2016, 8:07 AM

## 2016-12-12 ENCOUNTER — Inpatient Hospital Stay (HOSPITAL_COMMUNITY): Payer: Medicare Other

## 2016-12-12 DIAGNOSIS — R1311 Dysphagia, oral phase: Secondary | ICD-10-CM

## 2016-12-12 DIAGNOSIS — I10 Essential (primary) hypertension: Secondary | ICD-10-CM

## 2016-12-12 LAB — BASIC METABOLIC PANEL
ANION GAP: 10 (ref 5–15)
BUN: 18 mg/dL (ref 6–20)
CHLORIDE: 114 mmol/L — AB (ref 101–111)
CO2: 24 mmol/L (ref 22–32)
Calcium: 8.2 mg/dL — ABNORMAL LOW (ref 8.9–10.3)
Creatinine, Ser: 0.81 mg/dL (ref 0.44–1.00)
GFR calc Af Amer: >60 mL/min (ref >60)
GLUCOSE: 154 mg/dL — AB (ref 65–99)
POTASSIUM: 4.6 mmol/L (ref 3.5–5.1)
Sodium: 148 mmol/L — ABNORMAL HIGH (ref 135–145)

## 2016-12-12 LAB — GLUCOSE, CAPILLARY
GLUCOSE-CAPILLARY: 148 mg/dL — AB (ref 65–99)
GLUCOSE-CAPILLARY: 155 mg/dL — AB (ref 65–99)
GLUCOSE-CAPILLARY: 140 mg/dL — AB (ref 65–99)
GLUCOSE-CAPILLARY: 140 mg/dL — AB (ref 65–99)
Glucose-Capillary: 143 mg/dL — ABNORMAL HIGH (ref 65–99)

## 2016-12-12 LAB — CBC WITH DIFFERENTIAL/PLATELET
BASOS ABS: 0.1 10*3/uL (ref 0.0–0.1)
BASOS PCT: 1 %
EOS PCT: 2 %
Eosinophils Absolute: 0.2 10*3/uL (ref 0.0–0.7)
HEMATOCRIT: 36.8 % (ref 36.0–46.0)
HEMOGLOBIN: 11.1 g/dL — AB (ref 12.0–15.0)
LYMPHS ABS: 2.7 10*3/uL (ref 0.7–4.0)
LYMPHS PCT: 25 %
MCH: 30.7 pg (ref 26.0–34.0)
MCHC: 30.2 g/dL (ref 30.0–36.0)
MCV: 101.7 fL — ABNORMAL HIGH (ref 78.0–100.0)
Monocytes Absolute: 0.8 10*3/uL (ref 0.1–1.0)
Monocytes Relative: 7 %
NEUTROS ABS: 7 10*3/uL (ref 1.7–7.7)
Neutrophils Relative %: 65 %
Platelets: 194 10*3/uL (ref 150–400)
RBC: 3.62 MIL/uL — ABNORMAL LOW (ref 3.87–5.11)
RDW: 14.7 % (ref 11.5–15.5)
WBC: 10.8 10*3/uL — ABNORMAL HIGH (ref 4.0–10.5)

## 2016-12-12 LAB — C DIFFICILE QUICK SCREEN W PCR REFLEX
C DIFFICILE (CDIFF) INTERP: NOT DETECTED
C Diff antigen: NEGATIVE
C Diff toxin: NEGATIVE

## 2016-12-12 MED ORDER — LOSARTAN POTASSIUM 50 MG PO TABS
50.0000 mg | ORAL_TABLET | Freq: Every day | ORAL | Status: DC
  Administered 2016-12-12 – 2016-12-21 (×10): 50 mg via ORAL
  Filled 2016-12-12 (×10): qty 1

## 2016-12-12 MED ORDER — DEXTROSE-NACL 5-0.45 % IV SOLN
INTRAVENOUS | Status: DC
  Administered 2016-12-12 (×2): via INTRAVENOUS
  Administered 2016-12-13: 1000 mL via INTRAVENOUS
  Administered 2016-12-14 – 2016-12-15 (×3): via INTRAVENOUS

## 2016-12-12 MED ORDER — METOPROLOL TARTRATE 5 MG/5ML IV SOLN
5.0000 mg | Freq: Four times a day (QID) | INTRAVENOUS | Status: DC
  Administered 2016-12-12 – 2016-12-15 (×9): 5 mg via INTRAVENOUS
  Filled 2016-12-12 (×10): qty 5

## 2016-12-12 NOTE — Progress Notes (Signed)
Patient arrived to unit via bed. Assessment is as charted. Telemetry placed and verified with CCMD. IV patent and infusing as ordered. Denies complaints of pain at this time. Baseline dementia noted.

## 2016-12-12 NOTE — Progress Notes (Signed)
Subjective: Mult liquid BMs  Objective: Vital signs in last 24 hours: Temp:  [97.4 F (36.3 C)-99.6 F (37.6 C)] 99.3 F (37.4 C) (01/28 0730) Pulse Rate:  [65-86] 72 (01/28 0800) Resp:  [15-31] 23 (01/28 0800) BP: (116-168)/(50-146) 155/67 (01/28 0800) SpO2:  [92 %-100 %] 92 % (01/28 0800) FiO2 (%):  [40 %] 40 % (01/27 2000) Weight:  [96.7 kg (213 lb 3 oz)] 96.7 kg (213 lb 3 oz) (01/28 0352) Last BM Date: 12/11/16  Intake/Output from previous day: 01/27 0701 - 01/28 0700 In: 1527.8 [I.V.:1477.8; IV Piggyback:50] Out: 1580 [Urine:1300; Emesis/NG output:280] Intake/Output this shift: No intake/output data recorded.  General appearance: cooperative Resp: claer but RR 28 Cardio: S1, S2 normal GI: soft, hernia reduces, +BS, not sig tender  Lab Results:   Recent Labs  12/10/16 0337 12/12/16 0229  WBC 7.3 10.8*  HGB 11.1* 11.1*  HCT 34.7* 36.8  PLT 180 194   BMET  Recent Labs  12/11/16 0325 12/12/16 0229  NA 148* 148*  K 3.1* 4.6  CL 106 114*  CO2 31 24  GLUCOSE 125* 154*  BUN 28* 18  CREATININE 1.04* 0.81  CALCIUM 8.3* 8.2*   PT/INR No results for input(s): LABPROT, INR in the last 72 hours. ABG  Recent Labs  12/10/16 0245  PHART 7.538*  HCO3 35.0*    Studies/Results: Dg Chest Port 1 View  Result Date: 12/12/2016 CLINICAL DATA:  Acute on chronic respiratory failure with hypoxemia EXAM: PORTABLE CHEST 1 VIEW COMPARISON:  12/10/2016 FINDINGS: Cardiomegaly with vascular congestion and bibasilar atelectasis. No visible effusions. No change since prior study. Interval extubation. IMPRESSION: Interval extubation. Continued vascular congestion and bibasilar atelectasis. Electronically Signed   By: Charlett Nose M.D.   On: 12/12/2016 07:33   Dg Abd Portable 1v  Result Date: 12/11/2016 CLINICAL DATA:  Small bowel obstruction EXAM: PORTABLE ABDOMEN - 1 VIEW COMPARISON:  12/10/2016 FINDINGS: Gastrostomy tube tip is in the right abdomen, likely in the very  pyloric region. Dilated small bowel loops again noted, not significantly changed compatible with small bowel obstruction. No free air organomegaly. Contrast material again noted within the stomach and scattered throughout small bowel loops. IMPRESSION: Continued small bowel obstruction pattern.  No real change. Electronically Signed   By: Charlett Nose M.D.   On: 12/11/2016 09:52   Dg Abd Portable 1v-small Bowel Obstruction Protocol-initial, 8 Hr Delay  Result Date: 12/10/2016 CLINICAL DATA:  Small bowel obstruction. EXAM: PORTABLE ABDOMEN - 1 VIEW COMPARISON:  12/08/2016 and 12/09/2016, CT 12/08/2016 FINDINGS: NG tube present with tip in the right upper quadrant likely over the distal stomach. Contrast is present within the stomach. There are persistent air-filled dilated small bowel loops in the central and left abdomen with slightly less dilatation. Remainder the exam is unchanged. IMPRESSION: Persistent air-filled dilated small bowel loops over the central and left abdomen with slight improvement. Nasogastric tube with tip in the right upper quadrant likely over the distal stomach. Contrast present within the stomach. Electronically Signed   By: Elberta Fortis M.D.   On: 12/10/2016 19:45    Anti-infectives: Anti-infectives    Start     Dose/Rate Route Frequency Ordered Stop   12/08/16 1200  piperacillin-tazobactam (ZOSYN) IVPB 3.375 g  Status:  Discontinued     3.375 g 12.5 mL/hr over 240 Minutes Intravenous Every 8 hours 12/08/16 0717 12/11/16 0846   12/08/16 0345  vancomycin (VANCOCIN) IVPB 1000 mg/200 mL premix     1,000 mg 200 mL/hr over 60 Minutes  Intravenous  Once 12/08/16 0333 12/08/16 0740   12/08/16 0345  piperacillin-tazobactam (ZOSYN) IVPB 3.375 g     3.375 g 100 mL/hr over 30 Minutes Intravenous  Once 12/08/16 0333 12/08/16 0741      Assessment/Plan: SBO/UH - bowel function returned, c diff neg. Clamp NGT and allow ice chips  LOS: 4 days    Jami Ohlin E 12/12/2016

## 2016-12-12 NOTE — Progress Notes (Signed)
Name: Adrienne [name] MRN: [id number] DOB: Dec 30, 1935    LOS: 4 DATE OF ADMISSION: 12/12/2016  PCCM PROGRESS NOTE   BRIEF: 81 y/o female with multiple medical problems admitted with dementia from SNF with ileus vs SBO, high lactic acid.  She was intubated on 1/24.    Lines / Drains: PIVs ETT 1/24 > 1/27  Cultures: Blood 1/24 >> Urine 1/24 >> no growth  Antibiotics: Vanc 1/24>1/24 (x1 in ED) Zosyn 1/24>>1/27  Tests / Events: Admit 1/24   Subjective: Extubated yesterday, doing well. No acute events overnight. NGT output 280cc.    Vital Signs: Temp:  [97.4 F (36.3 C)-99.6 F (37.6 C)] 98.6 F (37 C) (01/28 0428) Pulse Rate:  [65-99] 69 (01/28 0600) Resp:  [15-31] 24 (01/28 0600) BP: (116-170)/(50-146) 148/62 (01/28 0600) SpO2:  [93 %-100 %] 94 % (01/28 0600) FiO2 (%):  [40 %] 40 % (01/27 2000) Weight:  [213 lb 3 oz (96.7 kg)] 213 lb 3 oz (96.7 kg) (01/28 0352) I/O last 3 completed shifts: In: 1605.8 [I.V.:1025.9; NG/GT:90; IV Piggyback:489.9] Out: 2830 [Urine:2700; Emesis/NG output:130]  Physical Examination: General: elderly obese woman resting in bed, NAD HENT: [place]/AT, EOMI, PERRL PULM: Rhonchi bilaterally, breaths non-labored on supp oxygen CV: RRR, no m/g/r GI: BS+, soft, non-distended Neuro: awakes to voice, moves all extremities  Labs and Imaging:   CXR 1/24 >> negative  Abdominal Xray 1/24 >> high grade SBO, no definite pneumoperitoneum  CT abdomen pelvis 1/24 >> fat containing umbilical hernia with incarceration/strangulation, also with findings concerning for ileus vs SBO  KUB 1/26 > continued small bowel loop dilation but improved  Assessment and Plan:  PULMONARY  ASSESSMENT: Acute respiratory failure with hypoxemia > improved ventilatory mechanics but distended abdomen pushing up on thorax OSA Extubated 1/27  PLAN:   Supplemental oxygen if needed for O2 >92%  CARDIOVASCULAR  ASSESSMENT:  Hypertension Hyperlipidemia PLAN:  Continue  sched low dose metoprolol IV Add back home Losartan Tele monitoring   RENAL  ASSESSMENT:   AKI- resolved  Lactic acidosis resolved Hypokalemia- resolved PLAN:   Monitor BMET and UOP Replace electrolytes as needed Stop D5 1/2 NS with KCl, switch to D5W 1/2 NS at 75 ml/hr with K increase  GASTROINTESTINAL  ASSESSMENT:   SBO vs ileus> still minimal bowel sounds but KUB improving PLAN:   Appreciate surgery support Replace electrolytes Continue NG No meds via tube  HEMATOLOGIC  ASSESSMENT:   No acute issues PLAN:  Monitor for bleeding Continue DVT prophylaxis   INFECTIOUS  ASSESSMENT:   Enteritis? doubt PLAN:   Abx off Monitor cultures  ENDOCRINE  ASSESSMENT:   Hyperglycemia  PLAN:   Continue SSI and CBG q4h  NEUROLOGIC  ASSESSMENT:   Dementia PLAN:   Lights on/blinds open OOB to chair   Family Update: Daughter updated 1/27 by phone  Rich Numberarly Rivet, MD, MPH Internal Medicine Resident, PGY-III Pager: [phone]  12/12/2016, 6:54 AM

## 2016-12-13 LAB — GLUCOSE, CAPILLARY
GLUCOSE-CAPILLARY: 130 mg/dL — AB (ref 65–99)
GLUCOSE-CAPILLARY: 107 mg/dL — AB (ref 65–99)
GLUCOSE-CAPILLARY: 148 mg/dL — AB (ref 65–99)
Glucose-Capillary: 129 mg/dL — ABNORMAL HIGH (ref 65–99)
Glucose-Capillary: 130 mg/dL — ABNORMAL HIGH (ref 65–99)
Glucose-Capillary: 135 mg/dL — ABNORMAL HIGH (ref 65–99)

## 2016-12-13 LAB — CBC
HCT: 36.5 % (ref 36.0–46.0)
HEMOGLOBIN: 11.4 g/dL — AB (ref 12.0–15.0)
MCH: 31 pg (ref 26.0–34.0)
MCHC: 31.2 g/dL (ref 30.0–36.0)
MCV: 99.2 fL (ref 78.0–100.0)
PLATELETS: 189 10*3/uL (ref 150–400)
RBC: 3.68 MIL/uL — AB (ref 3.87–5.11)
RDW: 14.3 % (ref 11.5–15.5)
WBC: 12 10*3/uL — ABNORMAL HIGH (ref 4.0–10.5)

## 2016-12-13 LAB — BASIC METABOLIC PANEL
Anion gap: 9 (ref 5–15)
BUN: 12 mg/dL (ref 6–20)
CHLORIDE: 111 mmol/L (ref 101–111)
CO2: 23 mmol/L (ref 22–32)
CREATININE: 0.8 mg/dL (ref 0.44–1.00)
Calcium: 8.4 mg/dL — ABNORMAL LOW (ref 8.9–10.3)
GFR calc non Af Amer: >60 mL/min (ref >60)
Glucose, Bld: 138 mg/dL — ABNORMAL HIGH (ref 65–99)
Potassium: 4.5 mmol/L (ref 3.5–5.1)
Sodium: 143 mmol/L (ref 135–145)

## 2016-12-13 LAB — CULTURE, BLOOD (ROUTINE X 2)
CULTURE: NO GROWTH
Culture: NO GROWTH

## 2016-12-13 MED ORDER — BOOST / RESOURCE BREEZE PO LIQD
1.0000 | Freq: Three times a day (TID) | ORAL | Status: DC
  Administered 2016-12-13 – 2016-12-21 (×20): 1 via ORAL

## 2016-12-13 NOTE — Care Management Important Message (Signed)
Important Message  Patient Details  Name: Adrienne [name] MRN: [id number] Date of Birth: 1936/04/03   Medicare Important Message Given:  Yes    Bland Rudzinski 12/13/2016, 1:56 PM

## 2016-12-13 NOTE — Progress Notes (Signed)
PROGRESS NOTE    Adrienne [name]  NFA:[id number] DOB: 01/21/1936 DOA: 12/08/2016 PCP: [name], MD   Brief Narrative: [name] with pmhx significant for DM, HTN, OSA, and dementia who presents from her nursing home with abdominal distention and respiratory distress. Patient was found to haveileus vs SBO, high lactic acid.  She was intubated on 1/24 to 1/27. Transferred to [facility] from [facility] on 12/13/2016.  Assessment & Plan:  # Acute respiratory failure with hypoxia: Required mechanical ventilator.  -Chest x-ray with bibasilar atelectasis. Patient was extubated and currently on oxygen by nasal cannula. Not on antibiotics. Try to wean off oxygen gradually. Continue supportive care.  #Small bowel obstruction versus ileus: Patient is still has NG tube which is clamped by surgeon. Possibility mouth NG tube tomorrow. Patient also has a rectal tube and passing stool. Patient has positive bowel sound. Currently on clear liquid diet. -Surgery consult appreciated.  # Hypertension: Continue losartan. Monitor blood pressure.  #Acute kidney injury: Improved. Monitor BMP.  #Lactic acidosis resolved  #Hypokalemia: Improved. Monitor BMP  #Dementia without behavioral disturbance: Continue supportive treatment. -PT, OT evaluation and treatment.  Principal Problem:   Acute respiratory failure with hypoxemia (HCC) Active Problems:   SBO (small bowel obstruction)   Acute renal failure (HCC)   Elevated lactic acid level   Metabolic acidosis   Ileus (HCC)   Ventilator dependent (HCC)   Pressure injury of skin   Oral phase dysphagia  DVT prophylaxis: Heparin subcutaneous Code Status: Full code Family Communication: No family present at bedside Disposition Plan: Likely discharge to SNF in 2-3 days.    Consultants:   General surgery  Procedures: NG tube placement Antimicrobials: None at this time  Subjective: Patient was seen and examined at bedside. Patient was alert awake and  following simple commands. Patient is still has NG tube and rectal tube. Denied chest pain, shortness of breath. No nausea or vomiting.   Objective: Vitals:   12/13/16 0404 12/13/16 0411 12/13/16 0800 12/13/16 0956  BP: (!) 147/54  (!) 170/70 (!) 152/52  Pulse: 60  69 (!) 58  Resp: 18  20 16   Temp: 98.2 F (36.8 C)  97.9 F (36.6 C) 98.7 F (37.1 C)  TempSrc: Oral  Oral Oral  SpO2: 98%  95% 95%  Weight:  97.4 kg (214 lb 11.7 oz)    Height:        Intake/Output Summary (Last 24 hours) at 12/13/16 1514 Last data filed at 12/13/16 1333  Gross per 24 hour  Intake             1060 ml  Output             1100 ml  Net              -40 ml   Filed Weights   12/12/16 0352 12/12/16 2017 12/13/16 0411  Weight: 96.7 kg (213 lb 3 oz) 96.8 kg (213 lb 6.5 oz) 97.4 kg (214 lb 11.7 oz)    Examination:  General exam: Ill-looking elderly female lying on bed, not in distress. NG tube in place. Respiratory system: Clear to auscultation. Respiratory effort normal. No wheezing or crackle Cardiovascular system: S1 & S2 heard, RRR.  No pedal edema. Gastrointestinal system: Abdomen is soft and nontender. Normal bowel sounds heard. Central nervous system: Alert and oriented. No focal neurological deficits. Extremities: Symmetric 5 x 5 power. Skin: No rashes, lesions or ulcers Psychiatry: Following simple commands.     Data Reviewed: I have  personally reviewed following labs and imaging studies  CBC:  Recent Labs Lab 12/08/16 0250 12/08/16 0418 12/09/16 0724 12/10/16 0337 12/12/16 0229 12/13/16 0523  WBC 10.8*  --  5.7 7.3 10.8* 12.0*  NEUTROABS 8.3*  --   --   --  7.0  --   HGB 14.9 15.3* 11.3* 11.1* 11.1* 11.4*  HCT 45.5 45.0 34.5* 34.7* 36.8 36.5  MCV 97.6  --  96.1 97.7 101.7* 99.2  PLT 229  --  156 180 194 189   Basic Metabolic Panel:  Recent Labs Lab 12/08/16 0715  12/09/16 0724 12/10/16 0337 12/11/16 0325 12/12/16 0229 12/13/16 0523  NA  --   < > 142 146* 148* 148*  143  K  --   < > 3.3* 3.1* 3.1* 4.6 4.5  CL  --   < > 97* 103 106 114* 111  CO2  --   < > 28 33* 31 24 23   GLUCOSE  --   < > 214* 152* 125* 154* 138*  BUN  --   < > 58* 42* 28* 18 12  CREATININE  --   < > 1.77* 1.18* 1.04* 0.81 0.80  CALCIUM  --   < > 7.7* 8.0* 8.3* 8.2* 8.4*  MG 1.7  --  1.8 2.5* 2.3  --   --   PHOS  --   --  2.1* 3.2 3.3  --   --   < > = values in this interval not displayed. GFR: Estimated Creatinine Clearance: 65.7 mL/min (by C-G formula based on SCr of 0.8 mg/dL). Liver Function Tests:  Recent Labs Lab 12/08/16 0250 12/08/16 0922 12/08/16 1605  AST 44* 39 37  ALT 26 24 22   ALKPHOS 38 31* 28*  BILITOT 2.1* 1.8* 1.3*  PROT 7.7 6.9 5.9*  ALBUMIN 3.5 2.8* 2.5*    Recent Labs Lab 12/08/16 0922  LIPASE 61*  AMYLASE 76   No results for input(s): AMMONIA in the last 168 hours. Coagulation Profile:  Recent Labs Lab 12/08/16 0922  INR 1.45   Cardiac Enzymes: No results for input(s): CKTOTAL, CKMB, CKMBINDEX, TROPONINI in the last 168 hours. BNP (last 3 results) No results for input(s): PROBNP in the last 8760 hours. HbA1C: No results for input(s): HGBA1C in the last 72 hours. CBG:  Recent Labs Lab 12/12/16 2015 12/13/16 0007 12/13/16 0401 12/13/16 0754 12/13/16 1225  GLUCAP 140* 129* 130* 135* 130*   Lipid Profile: No results for input(s): CHOL, HDL, LDLCALC, TRIG, CHOLHDL, LDLDIRECT in the last 72 hours. Thyroid Function Tests: No results for input(s): TSH, T4TOTAL, FREET4, T3FREE, THYROIDAB in the last 72 hours. Anemia Panel: No results for input(s): VITAMINB12, FOLATE, FERRITIN, TIBC, IRON, RETICCTPCT in the last 72 hours. Sepsis Labs:  Recent Labs Lab 12/08/16 1605 12/08/16 2127 12/09/16 0724 12/10/16 1123  LATICACIDVEN 4.3* 4.2* 4.6* 1.7    Recent Results (from the past 240 hour(s))  Blood Culture (routine x 2)     Status: None (Preliminary result)   Collection Time: 12/08/16  2:48 AM  Result Value Ref Range Status    Specimen Description BLOOD LEFT ARM  Final   Special Requests BOTTLES DRAWN AEROBIC AND ANAEROBIC 5ML  Final   Culture NO GROWTH 4 DAYS  Final   Report Status PENDING  Incomplete  Blood Culture (routine x 2)     Status: None (Preliminary result)   Collection Time: 12/08/16  2:53 AM  Result Value Ref Range Status   Specimen Description BLOOD RIGHT HAND  Final   Special Requests BOTTLES DRAWN AEROBIC AND ANAEROBIC  Final   Culture NO GROWTH 4 DAYS  Final   Report Status PENDING  Incomplete  Urine culture     Status: None   Collection Time: 12/08/16  5:49 AM  Result Value Ref Range Status   Specimen Description URINE, CATHETERIZED  Final   Special Requests NONE  Final   Culture NO GROWTH  Final   Report Status 12/09/2016 FINAL  Final  MRSA PCR Screening     Status: None   Collection Time: 12/08/16 12:52 PM  Result Value Ref Range Status   MRSA by PCR NEGATIVE NEGATIVE Final    Comment:        The GeneXpert MRSA Assay (FDA approved for NASAL specimens only), is one component of a comprehensive MRSA colonization surveillance program. It is not intended to diagnose MRSA infection nor to guide or monitor treatment for MRSA infections.   C difficile quick scan w PCR reflex     Status: None   Collection Time: 12/12/16  5:07 AM  Result Value Ref Range Status   C Diff antigen NEGATIVE NEGATIVE Final   C Diff toxin NEGATIVE NEGATIVE Final   C Diff interpretation No C. difficile detected.  Final         Radiology Studies: Dg Chest Port 1 View  Result Date: 12/12/2016 CLINICAL DATA:  Acute on chronic respiratory failure with hypoxemia EXAM: PORTABLE CHEST 1 VIEW COMPARISON:  12/10/2016 FINDINGS: Cardiomegaly with vascular congestion and bibasilar atelectasis. No visible effusions. No change since prior study. Interval extubation. IMPRESSION: Interval extubation. Continued vascular congestion and bibasilar atelectasis. Electronically Signed   By: Charlett Nose M.D.   On:  12/12/2016 07:33        Scheduled Meds: . famotidine (PEPCID) IV  10 mg Intravenous Q12H  . heparin  5,000 Units Subcutaneous Q8H  . insulin aspart  0-9 Units Subcutaneous Q4H  . losartan  50 mg Oral Daily  . mouth rinse  15 mL Mouth Rinse BID  . metoprolol  5 mg Intravenous Q6H   Continuous Infusions: . dextrose 5 % and 0.45% NaCl 1,000 mL (12/13/16 0421)     LOS: 5 days    Carlus Stay Jaynie Collins, MD [facility] Pager [phone]  If 7PM-7AM, please contact night-coverage [email or web address] Password [facility] 12/13/2016, 3:14 PM

## 2016-12-13 NOTE — Progress Notes (Signed)
[facility] Progress Note     Subjective: Pt is having BM's. No complaints of abdominal pain. Mild nausea overnight but no vomiting.   Objective: Vital signs in last 24 hours: Temp:  [97.8 F (36.6 C)-98.7 F (37.1 C)] 98.7 F (37.1 C) (01/29 0956) Pulse Rate:  [58-83] 58 (01/29 0956) Resp:  [16-26] 16 (01/29 0956) BP: (147-181)/(52-78) 152/52 (01/29 0956) SpO2:  [90 %-98 %] 95 % (01/29 0956) Weight:  [213 lb 6.5 oz (96.8 kg)-214 lb 11.7 oz (97.4 kg)] 214 lb 11.7 oz (97.4 kg) (01/29 0411) Last BM Date: 12/12/16  Intake/Output from previous day: 01/28 0701 - 01/29 0700 In: 992.5 [P.O.:520; I.V.:447.5; IV Piggyback:25] Out: 1300 [Urine:1300] Intake/Output this shift: Total I/O In: 60 [P.O.:60] Out: 0   PE: Gen: pleasant, cooperative, obese female in NAD lying in bed Card: regular rate, S1 and S2 normal Pulm:  rate and effort normal Abd: Obese, Soft, normal BS, large umbilical hernia that is easily reducible with mild TTP no other abdominal tenderness noted Skin: not diaphoretic, warm and dry  Lab Results:   Recent Labs  12/12/16 0229 12/13/16 0523  WBC 10.8* 12.0*  HGB 11.1* 11.4*  HCT 36.8 36.5  PLT 194 189   BMET  Recent Labs  12/12/16 0229 12/13/16 0523  NA 148* 143  K 4.6 4.5  CL 114* 111  CO2 24 23  GLUCOSE 154* 138*  BUN 18 12  CREATININE 0.81 0.80  CALCIUM 8.2* 8.4*   PT/INR No results for input(s): LABPROT, INR in the last 72 hours. CMP     Component Value Date/Time   NA 143 12/13/2016 0523   K 4.5 12/13/2016 0523   CL 111 12/13/2016 0523   CO2 23 12/13/2016 0523   GLUCOSE 138 (H) 12/13/2016 0523   BUN 12 12/13/2016 0523   CREATININE 0.80 12/13/2016 0523   CALCIUM 8.4 (L) 12/13/2016 0523   PROT 5.9 (L) 12/08/2016 1605   ALBUMIN 2.5 (L) 12/08/2016 1605   AST 37 12/08/2016 1605   ALT 22 12/08/2016 1605   ALKPHOS 28 (L) 12/08/2016 1605   BILITOT 1.3 (H) 12/08/2016 1605   GFRNONAA >60 12/13/2016 0523   GFRAA >60  12/13/2016 0523   Lipase     Component Value Date/Time   LIPASE 61 (H) 12/08/2016 0922       Studies/Results: Dg Chest Port 1 View  Result Date: 12/12/2016 CLINICAL DATA:  Acute on chronic respiratory failure with hypoxemia EXAM: PORTABLE CHEST 1 VIEW COMPARISON:  12/10/2016 FINDINGS: Cardiomegaly with vascular congestion and bibasilar atelectasis. No visible effusions. No change since prior study. Interval extubation. IMPRESSION: Interval extubation. Continued vascular congestion and bibasilar atelectasis. Electronically Signed   By: Charlett Nose M.D.   On: 12/12/2016 07:33    Anti-infectives: Anti-infectives    Start     Dose/Rate Route Frequency Ordered Stop   12/08/16 1200  piperacillin-tazobactam (ZOSYN) IVPB 3.375 g  Status:  Discontinued     3.375 g 12.5 mL/hr over 240 Minutes Intravenous Every 8 hours 12/08/16 0717 12/11/16 0846   12/08/16 0345  vancomycin (VANCOCIN) IVPB 1000 mg/200 mL premix     1,000 mg 200 mL/hr over 60 Minutes Intravenous  Once 12/08/16 0333 12/08/16 0740   12/08/16 0345  piperacillin-tazobactam (ZOSYN) IVPB 3.375 g     3.375 g 100 mL/hr over 30 Minutes Intravenous  Once 12/08/16 0333 12/08/16 0741       Assessment/Plan  Acute hypoxic respiratory failure OSA Lactic acidosis  HTN HLD Renal dysfunction -  SCr WNL  Abdominal distention/nausea Umbilical hernia - reducible, soft,mildly tender on exam Ileus v SBO - lactic acid WNL - blood culture no growth  - having BM's - tolerated NGT clamping, can advance to clears   FEN: clears, IVF, NGT clamped and tolerating clears can remove tube ID: Vanc 1/24 once, Zosyn 1/24 >> VTE: subcutaneous heparin  Plan: Pt tolerated BGT clamping and clears, and is having BM's. Can DC NGT.  Benign abdominal exam and lactate WNL - No acute surgical needs at this time. We will continue to follow.   LOS: 5 days    Jerre SimonJessica L Shamiracle Gorden , [facility] 12/13/2016, 10:24 AM Pager:  [phone] Consults: [phone] Mon-Fri 7:00 am-4:30 pm Sat-Sun 7:00 am-11:30 am

## 2016-12-13 NOTE — Progress Notes (Signed)
Patients 02 decreased to 2L with ox sat at 95%. Will continue to monitor 02 sats and attempt to wean.

## 2016-12-13 NOTE — Progress Notes (Signed)
   12/13/16 1500  Clinical Encounter Type  Visited With Patient  Visit Type Spiritual support  Spiritual Encounters  Spiritual Needs Prayer  Stress Factors  Patient Stress Factors None identified  Introduction to Pt. Offered prayer. Pt would like communion. Plan to bring tomorrow.

## 2016-12-13 NOTE — Progress Notes (Signed)
Nutrition Follow-up  DOCUMENTATION CODES:   Obesity unspecified  INTERVENTION:  Provide Boost Breeze po TID, each supplement provides 250 kcal and 9 grams of protein.  Encourage adequate PO intake.   NUTRITION DIAGNOSIS:   Inadequate oral intake related to altered GI function as evidenced by NPO status; diet advanced to clear liquids; improving  GOAL:   Patient will meet greater than or equal to 90% of their needs; progressing  MONITOR:   PO intake, Supplement acceptance, Diet advancement, Labs, Weight trends, Skin, I & O's  REASON FOR ASSESSMENT:   Ventilator    ASSESSMENT:   Pt with pmhx significant for DM, HTN, OSA, and dementia who presents from her nursing home with abdominal distention and respiratory distress. Pt extubated 1/27.   NGT clamped. Per MD note, possible NGT removal tomorrow. Pt is currently on a clear liquid diet with 100% at lunch today. Pt reports eating well PTA. Unable to obtain usual body weight during time of visit. Pt agreeable to nutritional supplements to aid in caloric and protein needs. RD to order.   Labs and medications reviewed.   Diet Order:  Diet clear liquid Room service appropriate? Yes; Fluid consistency: Thin  Skin:  Wound (see comment) (DTI to buttocks)  Last BM:  1/29  Height:   Ht Readings from Last 1 Encounters:  12/08/16 5\' 4"  (1.626 m)    Weight:   Wt Readings from Last 1 Encounters:  12/13/16 214 lb 11.7 oz (97.4 kg)    Ideal Body Weight:  54.5 kg  BMI:  Body mass index is 36.86 kg/m.  Estimated Nutritional Needs:   Kcal:  1750-1950  Protein:  85-95 grams  Fluid:  1.7 - 1.9 L/day  EDUCATION NEEDS:   No education needs identified at this time  Roslyn SmilingStephanie Elverda Wendel, MS, RD, LDN Pager # [phone] After hours/ weekend pager # [phone]

## 2016-12-14 ENCOUNTER — Encounter (HOSPITAL_COMMUNITY): Payer: Self-pay | Admitting: General Practice

## 2016-12-14 LAB — GLUCOSE, CAPILLARY
GLUCOSE-CAPILLARY: 124 mg/dL — AB (ref 65–99)
GLUCOSE-CAPILLARY: 140 mg/dL — AB (ref 65–99)
Glucose-Capillary: 118 mg/dL — ABNORMAL HIGH (ref 65–99)
Glucose-Capillary: 122 mg/dL — ABNORMAL HIGH (ref 65–99)
Glucose-Capillary: 156 mg/dL — ABNORMAL HIGH (ref 65–99)
Glucose-Capillary: 164 mg/dL — ABNORMAL HIGH (ref 65–99)
Glucose-Capillary: 97 mg/dL (ref 65–99)

## 2016-12-14 MED ORDER — LOPERAMIDE HCL 2 MG PO CAPS: 2.0000 mg | ORAL_CAPSULE | Freq: Once | ORAL | Status: DC

## 2016-12-14 NOTE — NC FL2 (Signed)
[place] MEDICAID FL2 LEVEL OF CARE SCREENING TOOL     IDENTIFICATION  Patient Name: Adrienne [name] Birthdate: 26-Nov-1935 Sex: female Admission Date (Current Location): 12/08/2016  [facility] and [place] Number:  Producer, television/film/video and Address:  [facility], [address], [place], [place] [address]      Provider Number: [id number]  Attending Physician Name and Address:  Maxie Barb, MD  Relative Name and Phone Number:       Current Level of Care: Hospital Recommended Level of Care: Skilled Nursing Facility Prior Approval Number:    Date Approved/Denied:   PASRR Number:    Discharge Plan: SNF    Current Diagnoses: Patient Active Problem List   Diagnosis Date Noted  . Oral phase dysphagia   . Pressure injury of skin 12/11/2016  . Ventilator dependent (HCC)   . SBO (small bowel obstruction) 12/08/2016  . Acute respiratory failure with hypoxemia (HCC)   . Acute renal failure (HCC)   . Elevated lactic acid level   . Metabolic acidosis   . Ileus (HCC)   . Pneumonia 11/02/2013  . Syncope 11/02/2013  . Falls frequently 11/02/2013  . Anxiety state, unspecified 11/02/2013  . Depression 11/02/2013  . Unspecified hypothyroidism 11/02/2013  . Essential hypertension 11/02/2013  . HLD (hyperlipidemia) 11/02/2013  . Diabetes (HCC) 11/02/2013  . Dementia with behavioral disturbance 11/02/2013  . Chronic pain syndrome 11/02/2013  . Headache(784.0) 10/23/2013  . Postop Hypokalemia 01/15/2013  . Postoperative anemia due to acute blood loss 01/15/2013  . Postop Transfusion 01/15/2013  . OA (osteoarthritis) of knee 04/21/2012  . Pre-operative clearance 02/08/2012  . OSA (obstructive sleep apnea) 05/03/2011    Orientation RESPIRATION BLADDER Height & Weight     Self, Time, Situation, Place  O2 (2L [place]) Incontinent Weight: 219 lb (99.3 kg) Height:  5\' 4"  (162.6 cm)  BEHAVIORAL SYMPTOMS/MOOD NEUROLOGICAL BOWEL NUTRITION STATUS   Incontinent Diet (see DC summary)  AMBULATORY STATUS COMMUNICATION OF NEEDS Skin   Total Care Verbally Normal                       Personal Care Assistance Level of Assistance  Total care       Total Care Assistance: Maximum assistance   Functional Limitations Info             SPECIAL CARE FACTORS FREQUENCY                       Contractures      Additional Factors Info  Code Status, Allergies, Insulin Sliding Scale Code Status Info: FULL Allergies Info: Prednisone, Ibuprofen, Meclizine, Sulfonamide Derivatives   Insulin Sliding Scale Info: 6/day       Current Medications (12/14/2016):  This is the current hospital active medication list Current Facility-Administered Medications  Medication Dose Route Frequency Provider Last Rate Last Dose  . 0.9 %  sodium chloride infusion  250 mL Intravenous PRN Servando Snare, MD 10 mL/hr at 12/14/16 0453 250 mL at 12/14/16 0453  . acetaminophen (TYLENOL) tablet 650 mg  650 mg Oral Q6H PRN Servando Snare, MD   650 mg at 12/14/16 1211  . dextrose 5 %-0.45 % sodium chloride infusion   Intravenous Continuous Su Hoff, MD 75 mL/hr at 12/14/16 0857    . famotidine (PEPCID) 10 mg in sodium chloride 0.9 % 25 mL  10 mg Intravenous Q12H Jose Alexis Frock, MD   10  mg at 12/14/16 1031  . feeding supplement (BOOST / RESOURCE BREEZE) liquid 1 Container  1 Container Oral TID BM Dron Jaynie CollinsPrasad Bhandari, MD   1 Container at 12/14/16 1032  . heparin injection 5,000 Units  5,000 Units Subcutaneous Q8H Alexa Lucrezia Starch Burns, MD   5,000 Units at 12/14/16 0528  . insulin aspart (novoLOG) injection 0-9 Units  0-9 Units Subcutaneous Q4H Jose Alexis FrockAngelo A de Dios, MD   2 Units at 12/14/16 1212  . losartan (COZAAR) tablet 50 mg  50 mg Oral Daily Su Hoffarly J Rivet, MD   50 mg at 12/14/16 1031  . MEDLINE mouth rinse  15 mL Mouth Rinse BID Lupita Leashouglas B McQuaid, MD   15 mL at 12/14/16 1033  . metoprolol (LOPRESSOR) injection 5 mg  5 mg Intravenous Q6H Lupita Leashouglas B  McQuaid, MD   5 mg at 12/14/16 0850  . morphine 2 MG/ML injection 1-2 mg  1-2 mg Intravenous Q2H PRN Lupita Leashouglas B McQuaid, MD   2 mg at 12/14/16 [id number]  . ondansetron (ZOFRAN) injection 4 mg  4 mg Intravenous Q6H PRN Alexa Lucrezia Starch Burns, MD         Discharge Medications: Please see discharge summary for a list of discharge medications.  Relevant Imaging Results:  Relevant Lab Results:   Additional Information    Burna SisUris, Nilda Keathley H, LCSW

## 2016-12-14 NOTE — Progress Notes (Signed)
   12/14/16 1050  Clinical Encounter Type  Visited With Patient  Visit Type Spiritual support  Spiritual Encounters  Spiritual Needs Ritual  Stress Factors  Patient Stress Factors None identified  Introduction to Pt. Provided communion according to the [place] faith.

## 2016-12-14 NOTE — Progress Notes (Signed)
12/14/2016 1:18 PM  Removed patient rectal tube per MD orders. Patient tolerated treatment well. Passed flatulence. Will continue to assess and monitor the patient.   Adrienne [name] [employer] OmahaYoung BSN, RN-BC [employer] [employer] [facility] Phone [phone]

## 2016-12-14 NOTE — Progress Notes (Signed)
12/14/2016 12:16 PM  Removed patient right NG tube per Surgical PA orders. Patient tolerated well. No complaints of nose pain at this time. Still complaining of headache. Check eMAR. Will continue to assess and monitor the patient.   Chisom Aust [employer] BSN, RN-BC [facility] Phone [phone]

## 2016-12-14 NOTE — Progress Notes (Signed)
PROGRESS NOTE    Adrienne [name]  ZOX:[id number] DOB: 02-01-1936 DOA: 12/08/2016 PCP: [name], MD   Brief Narrative: [name] with pmhx significant for DM, HTN, OSA, and dementia who presents from her nursing home with abdominal distention and respiratory distress. Patient was found to haveileus vs SBO, high lactic acid.  She was intubated on 1/24 to 1/27. Transferred to [facility] from [facility] on 12/13/2016.  Assessment & Plan:  # Acute respiratory failure with hypoxia: Required mechanical ventilator.  -Chest x-ray with bibasilar atelectasis. Patient was extubated and currently on oxygen by nasal cannula 2 Liters. Not on antibiotics. Try to wean off oxygen gradually. Continue supportive care.  #Small bowel obstruction versus ileus: The NG tube was removed as per general surgery today. The diet was advanced to soft. Discontinue Foley catheter, rectal tube. Continue to provide supportive care. Surgery consult appreciated. Patient has passed stool.  # Hypertension: Continue losartan. Monitor blood pressure.  #Acute kidney injury: Improved. Monitor BMP.  #Lactic acidosis resolved  #Hypokalemia: Improved. Repeat lab in the morning.  #Dementia without behavioral disturbance: Continue supportive treatment. -PT, OT evaluation and treatment.  Principal Problem:   Acute respiratory failure with hypoxemia (HCC) Active Problems:   SBO (small bowel obstruction)   Acute renal failure (HCC)   Elevated lactic acid level   Metabolic acidosis   Ileus (HCC)   Ventilator dependent (HCC)   Pressure injury of skin   Oral phase dysphagia  DVT prophylaxis: Heparin subcutaneous Code Status: Full code Family Communication: No family present at bedside Disposition Plan: Likely discharge to SNF in 1-2 days.    Consultants:   General surgery  Procedures: NG tube placement Antimicrobials: None at this time  Subjective: Patient was seen and examined at bedside. Feels weak and reported  passing stool. Denied nausea vomiting abdominal pain. No chest pain or shortness of breath.  Objective: Vitals:   12/13/16 1723 12/13/16 2100 12/14/16 0400 12/14/16 0928  BP: (!) 155/71 (!) 150/55 (!) 147/45 (!) 156/54  Pulse: 63 62 (!) 55 63  Resp: 14 17  18   Temp: 99.1 F (37.3 C) 98.2 F (36.8 C) 98 F (36.7 C) 98.7 F (37.1 C)  TempSrc: Oral Oral  Oral  SpO2: 96% 97% 98% 97%  Weight:    99.3 kg (219 lb)  Height:        Intake/Output Summary (Last 24 hours) at 12/14/16 1235 Last data filed at 12/14/16 1220  Gross per 24 hour  Intake             5132 ml  Output             1325 ml  Net             3807 ml   Filed Weights   12/12/16 2017 12/13/16 0411 12/14/16 0928  Weight: 96.8 kg (213 lb 6.5 oz) 97.4 kg (214 lb 11.7 oz) 99.3 kg (219 lb)    Examination:  General exam: Ill-looking elderly female lying on bed, not in distress. NG tube in placeWhich was later removed. Respiratory system: Clear bilateral. Respiratory effort normal. No wheezing or crackle Cardiovascular system: S1 & S2 heard, RRR.  No pedal edema. Gastrointestinal system: Abdomen soft, nontender, nondistended. Bowel sound positive. Central nervous system: Alert awake and following commands. Extremities: Symmetric 5 x 5 power. Skin: No rashes, lesions or ulcers Psychiatry: Following simple commands.     Data Reviewed: I have personally reviewed following labs and imaging studies  CBC:  Recent Labs Lab  12/08/16 0250 12/08/16 1610 12/09/16 0724 12/10/16 0337 12/12/16 0229 12/13/16 0523  WBC 10.8*  --  5.7 7.3 10.8* 12.0*  NEUTROABS 8.3*  --   --   --  7.0  --   HGB 14.9 15.3* 11.3* 11.1* 11.1* 11.4*  HCT 45.5 45.0 34.5* 34.7* 36.8 36.5  MCV 97.6  --  96.1 97.7 101.7* 99.2  PLT 229  --  156 180 194 189   Basic Metabolic Panel:  Recent Labs Lab 12/08/16 0715  12/09/16 0724 12/10/16 0337 12/11/16 0325 12/12/16 0229 12/13/16 0523  NA  --   < > 142 146* 148* 148* 143  K  --   < > 3.3*  3.1* 3.1* 4.6 4.5  CL  --   < > 97* 103 106 114* 111  CO2  --   < > 28 33* 31 24 23   GLUCOSE  --   < > 214* 152* 125* 154* 138*  BUN  --   < > 58* 42* 28* 18 12  CREATININE  --   < > 1.77* 1.18* 1.04* 0.81 0.80  CALCIUM  --   < > 7.7* 8.0* 8.3* 8.2* 8.4*  MG 1.7  --  1.8 2.5* 2.3  --   --   PHOS  --   --  2.1* 3.2 3.3  --   --   < > = values in this interval not displayed. GFR: Estimated Creatinine Clearance: 66.3 mL/min (by C-G formula based on SCr of 0.8 mg/dL). Liver Function Tests:  Recent Labs Lab 12/08/16 0250 12/08/16 0922 12/08/16 1605  AST 44* 39 37  ALT 26 24 22   ALKPHOS 38 31* 28*  BILITOT 2.1* 1.8* 1.3*  PROT 7.7 6.9 5.9*  ALBUMIN 3.5 2.8* 2.5*    Recent Labs Lab 12/08/16 0922  LIPASE 61*  AMYLASE 76   No results for input(s): AMMONIA in the last 168 hours. Coagulation Profile:  Recent Labs Lab 12/08/16 0922  INR 1.45   Cardiac Enzymes: No results for input(s): CKTOTAL, CKMB, CKMBINDEX, TROPONINI in the last 168 hours. BNP (last 3 results) No results for input(s): PROBNP in the last 8760 hours. HbA1C: No results for input(s): HGBA1C in the last 72 hours. CBG:  Recent Labs Lab 12/13/16 2023 12/14/16 0054 12/14/16 0533 12/14/16 0758 12/14/16 1142  GLUCAP 148* 97 118* 122* 156*   Lipid Profile: No results for input(s): CHOL, HDL, LDLCALC, TRIG, CHOLHDL, LDLDIRECT in the last 72 hours. Thyroid Function Tests: No results for input(s): TSH, T4TOTAL, FREET4, T3FREE, THYROIDAB in the last 72 hours. Anemia Panel: No results for input(s): VITAMINB12, FOLATE, FERRITIN, TIBC, IRON, RETICCTPCT in the last 72 hours. Sepsis Labs:  Recent Labs Lab 12/08/16 1605 12/08/16 2127 12/09/16 0724 12/10/16 1123  LATICACIDVEN 4.3* 4.2* 4.6* 1.7    Recent Results (from the past 240 hour(s))  Blood Culture (routine x 2)     Status: None   Collection Time: 12/08/16  2:48 AM  Result Value Ref Range Status   Specimen Description BLOOD LEFT ARM  Final    Special Requests BOTTLES DRAWN AEROBIC AND ANAEROBIC  Final   Culture NO GROWTH 5 DAYS  Final   Report Status 12/13/2016 FINAL  Final  Blood Culture (routine x 2)     Status: None   Collection Time: 12/08/16  2:53 AM  Result Value Ref Range Status   Specimen Description BLOOD RIGHT HAND  Final   Special Requests BOTTLES DRAWN AEROBIC AND ANAEROBIC  Final   Culture  NO GROWTH 5 DAYS  Final   Report Status 12/13/2016 FINAL  Final  Urine culture     Status: None   Collection Time: 12/08/16  5:49 AM  Result Value Ref Range Status   Specimen Description URINE, CATHETERIZED  Final   Special Requests NONE  Final   Culture NO GROWTH  Final   Report Status 12/09/2016 FINAL  Final  MRSA PCR Screening     Status: None   Collection Time: 12/08/16 12:52 PM  Result Value Ref Range Status   MRSA by PCR NEGATIVE NEGATIVE Final    Comment:        The GeneXpert MRSA Assay (FDA approved for NASAL specimens only), is one component of a comprehensive MRSA colonization surveillance program. It is not intended to diagnose MRSA infection nor to guide or monitor treatment for MRSA infections.   C difficile quick scan w PCR reflex     Status: None   Collection Time: 12/12/16  5:07 AM  Result Value Ref Range Status   C Diff antigen NEGATIVE NEGATIVE Final   C Diff toxin NEGATIVE NEGATIVE Final   C Diff interpretation No C. difficile detected.  Final         Radiology Studies: No results found.      Scheduled Meds: . famotidine (PEPCID) IV  10 mg Intravenous Q12H  . feeding supplement  1 Container Oral TID BM  . heparin  5,000 Units Subcutaneous Q8H  . insulin aspart  0-9 Units Subcutaneous Q4H  . losartan  50 mg Oral Daily  . mouth rinse  15 mL Mouth Rinse BID  . metoprolol  5 mg Intravenous Q6H   Continuous Infusions: . dextrose 5 % and 0.45% NaCl 75 mL/hr at 12/14/16 0857     LOS: 6 days    Deran Barro Jaynie CollinsPrasad Emme Rosenau, MD [facility] Pager [phone]  If  7PM-7AM, please contact night-coverage [email or web address] Password [facility] 12/14/2016, 12:35 PM

## 2016-12-14 NOTE — Progress Notes (Signed)
[facility] Progress Note     Subjective: Pt complaining of a mild frontal HA with mild sinus congestion. No abdominal pain. No nausea or vomiting. Pt states she is hungry.  Objective: Vital signs in last 24 hours: Temp:  [98 F (36.7 C)-99.1 F (37.3 C)] 98.7 F (37.1 C) (01/30 0928) Pulse Rate:  [55-63] 63 (01/30 0928) Resp:  [14-18] 18 (01/30 0928) BP: (147-156)/(45-71) 156/54 (01/30 0928) SpO2:  [96 %-98 %] 97 % (01/30 0928) Weight:  [219 lb (99.3 kg)] 219 lb (99.3 kg) (01/30 0928) Last BM Date: 12/14/16  Intake/Output from previous day: 01/29 0701 - 01/30 0700 In: 900 [P.O.:900] Out: 1275 [Urine:675; Emesis/NG output:600] Intake/Output this shift: Total I/O In: 200 [P.O.:200] Out: 0   PE: Gen: pleasant, cooperative, obese female in NAD lying in bed, well appearing Card: regular rate, S1 and S2 normal Pulm: rate and effort normal Abd: Obese, Soft, normal BS, large umbilical hernia that is easily reducible with mild TTP no other abdominal tenderness noted Skin: not diaphoretic, warm and dry  Lab Results:   Recent Labs  12/12/16 0229 12/13/16 0523  WBC 10.8* 12.0*  HGB 11.1* 11.4*  HCT 36.8 36.5  PLT 194 189   BMET  Recent Labs  12/12/16 0229 12/13/16 0523  NA 148* 143  K 4.6 4.5  CL 114* 111  CO2 24 23  GLUCOSE 154* 138*  BUN 18 12  CREATININE 0.81 0.80  CALCIUM 8.2* 8.4*   PT/INR No results for input(s): LABPROT, INR in the last 72 hours. CMP     Component Value Date/Time   NA 143 12/13/2016 0523   K 4.5 12/13/2016 0523   CL 111 12/13/2016 0523   CO2 23 12/13/2016 0523   GLUCOSE 138 (H) 12/13/2016 0523   BUN 12 12/13/2016 0523   CREATININE 0.80 12/13/2016 0523   CALCIUM 8.4 (L) 12/13/2016 0523   PROT 5.9 (L) 12/08/2016 1605   ALBUMIN 2.5 (L) 12/08/2016 1605   AST 37 12/08/2016 1605   ALT 22 12/08/2016 1605   ALKPHOS 28 (L) 12/08/2016 1605   BILITOT 1.3 (H) 12/08/2016 1605   GFRNONAA >60 12/13/2016 0523   GFRAA >60  12/13/2016 0523   Lipase     Component Value Date/Time   LIPASE 61 (H) 12/08/2016 [id number]       Studies/Results: No results found.  Anti-infectives: Anti-infectives    Start     Dose/Rate Route Frequency Ordered Stop   12/08/16 1200  piperacillin-tazobactam (ZOSYN) IVPB 3.375 g  Status:  Discontinued     3.375 g 12.5 mL/hr over 240 Minutes Intravenous Every 8 hours 12/08/16 0717 12/11/16 0846   12/08/16 0345  vancomycin (VANCOCIN) IVPB 1000 mg/200 mL premix     1,000 mg 200 mL/hr over 60 Minutes Intravenous  Once 12/08/16 0333 12/08/16 0740   12/08/16 0345  piperacillin-tazobactam (ZOSYN) IVPB 3.375 g     3.375 g 100 mL/hr over 30 Minutes Intravenous  Once 12/08/16 0333 12/08/16 0741       Assessment/Plan  Acute hypoxic respiratory failure OSA Lactic acidosis  HTN HLD Renal dysfunction - SCr WNL  Abdominal distention/nausea Umbilical hernia - reducible, soft,mildly tender on exam Ileus v SBO - lactic acid WNL - blood culture no growth  - having BM's, rectal tube - tolerated NGT clamping and clears, will DC NGT and advance diet as tolerated   FEN: soft diet, advance diet as tolerated, DC NGT ID: Vanc 1/24 once, Zosyn 1/24 >> VTE: subcutaneous heparin  Plan: Pt  tolerated BGT clamping and clears, and is having BM's. Can DC NGT.  Benign abdominal exam and lactate WNL - No acute surgical needs at this time. Pt is tolerating clears and is having bowel function. We will sign off. Please page [place] with any questions or concerns.     LOS: 6 days    Jerre Simon , [facility] 12/14/2016, 11:55 AM Pager: [phone] Consults: [phone] Mon-Fri 7:00 am-4:30 pm Sat-Sun 7:00 am-11:30 am

## 2016-12-14 NOTE — Progress Notes (Signed)
M.D on call said no anti diarrhea medicine for the patient.Few minutes later,another MD ordered Immodium.Nurse tried to trace the ordering MD but was not able to.Explained this situation to the incoming nurses.

## 2016-12-15 ENCOUNTER — Encounter (HOSPITAL_COMMUNITY): Payer: Self-pay | Admitting: *Deleted

## 2016-12-15 ENCOUNTER — Inpatient Hospital Stay (HOSPITAL_COMMUNITY): Payer: Medicare Other

## 2016-12-15 LAB — CBC
HEMATOCRIT: 41.5 % (ref 36.0–46.0)
Hemoglobin: 13.3 g/dL (ref 12.0–15.0)
MCH: 31.4 pg (ref 26.0–34.0)
MCHC: 32 g/dL (ref 30.0–36.0)
MCV: 98.1 fL (ref 78.0–100.0)
Platelets: 228 10*3/uL (ref 150–400)
RBC: 4.23 MIL/uL (ref 3.87–5.11)
RDW: 14.3 % (ref 11.5–15.5)
WBC: 20.9 10*3/uL — AB (ref 4.0–10.5)

## 2016-12-15 LAB — BASIC METABOLIC PANEL
ANION GAP: 10 (ref 5–15)
BUN: 9 mg/dL (ref 6–20)
CALCIUM: 8.5 mg/dL — AB (ref 8.9–10.3)
CO2: 22 mmol/L (ref 22–32)
Chloride: 107 mmol/L (ref 101–111)
Creatinine, Ser: 0.8 mg/dL (ref 0.44–1.00)
GFR calc Af Amer: >60 mL/min (ref >60)
GLUCOSE: 165 mg/dL — AB (ref 65–99)
Potassium: 3.6 mmol/L (ref 3.5–5.1)
Sodium: 139 mmol/L (ref 135–145)

## 2016-12-15 LAB — GLUCOSE, CAPILLARY
GLUCOSE-CAPILLARY: 166 mg/dL — AB (ref 65–99)
GLUCOSE-CAPILLARY: 159 mg/dL — AB (ref 65–99)
Glucose-Capillary: 144 mg/dL — ABNORMAL HIGH (ref 65–99)
Glucose-Capillary: 176 mg/dL — ABNORMAL HIGH (ref 65–99)
Glucose-Capillary: 183 mg/dL — ABNORMAL HIGH (ref 65–99)

## 2016-12-15 MED ORDER — PROMETHAZINE HCL 25 MG/ML IJ SOLN
6.2500 mg | Freq: Four times a day (QID) | INTRAMUSCULAR | Status: DC | PRN
  Administered 2016-12-22: 6.25 mg via INTRAVENOUS
  Filled 2016-12-15: qty 1

## 2016-12-15 MED ORDER — KCL IN DEXTROSE-NACL 40-5-0.9 MEQ/L-%-% IV SOLN
INTRAVENOUS | Status: DC
  Administered 2016-12-15 – 2016-12-17 (×2): via INTRAVENOUS
  Filled 2016-12-15 (×5): qty 1000

## 2016-12-15 MED ORDER — METOPROLOL TARTRATE 12.5 MG HALF TABLET
12.5000 mg | ORAL_TABLET | Freq: Two times a day (BID) | ORAL | Status: DC
  Administered 2016-12-15 – 2016-12-21 (×13): 12.5 mg via ORAL
  Filled 2016-12-15 (×13): qty 1

## 2016-12-15 MED ORDER — FAMOTIDINE 20 MG PO TABS
20.0000 mg | ORAL_TABLET | Freq: Two times a day (BID) | ORAL | Status: DC
  Administered 2016-12-15 – 2016-12-21 (×13): 20 mg via ORAL
  Filled 2016-12-15 (×14): qty 1

## 2016-12-15 MED ORDER — LORAZEPAM 2 MG/ML IJ SOLN
0.5000 mg | INTRAMUSCULAR | Status: DC | PRN
  Administered 2016-12-15 – 2016-12-18 (×4): 0.5 mg via INTRAVENOUS
  Filled 2016-12-15 (×4): qty 1

## 2016-12-15 NOTE — Evaluation (Signed)
Physical Therapy Evaluation Patient Details Name: Adrienne [name] MRN: [id number] DOB: 04-Aug-1936 Today's Date: 12/15/2016   History of Present Illness  81 yo female admitted with distended abdomen/nausea, ileus vs SBO, vent d/c 12/11/16. Acute respiratory failure with hypoxia: Required mechanical ventilator   Clinical Impression  Pt admitted with/for above complications.  Pt presently needing extensive assist of 2 persons for basic mobility.  Pt currently limited functionally due to the problems listed. ( See problems list.)   Pt will benefit from PT to maximize function and safety in order to get ready for next venue listed below.     Follow Up Recommendations SNF    Equipment Recommendations  Other (comment) (TBA)    Recommendations for Other Services       Precautions / Restrictions Precautions Precautions: Fall Precaution Comments: watch BP      Mobility  Bed Mobility Overal bed mobility: Needs Assistance Bed Mobility: Supine to Sit;Sit to Supine     Supine to sit: Total assist Sit to supine: Max assist;+2 for physical assistance;+2 for safety/equipment   General bed mobility comments: pt requires cues to sequence task with lack of initiation. in the middle of transfer pt asking to have tv turned on  Transfers Overall transfer level: Needs assistance   Transfers: Sit to/from Stand Sit to Stand: +2 physical assistance;Max assist         General transfer comment: pt requires bed elevated and elevated surface to attempt sit<>stand. pt requries pad at hip for hip extension  Ambulation/Gait             General Gait Details: unable  Stairs            Wheelchair Mobility    Modified Rankin (Stroke Patients Only)       Balance Overall balance assessment: Needs assistance Sitting-balance support: Bilateral upper extremity supported;Feet supported Sitting balance-Leahy Scale: Poor     Standing balance support: Bilateral upper extremity  supported;During functional activity Standing balance-Leahy Scale: Zero                               Pertinent Vitals/Pain Pain Assessment: Faces Faces Pain Scale: Hurts little more Pain Location: i hurt all over ( stomach my legs) Pain Descriptors / Indicators: Grimacing Pain Intervention(s): Premedicated before session    Home Living Family/patient expects to be discharged to:: Skilled nursing facility                 Additional Comments: [facility]     Prior Function Level of Independence: Needs assistance      ADL's / Homemaking Assistance Needed: daughter helps me "a lot"        Hand Dominance   Dominant Hand: Right    Extremity/Trunk Assessment   Upper Extremity Assessment Upper Extremity Assessment: Generalized weakness    Lower Extremity Assessment Lower Extremity Assessment: Generalized weakness    Cervical / Trunk Assessment Cervical / Trunk Assessment: Kyphotic  Communication   Communication: No difficulties  Cognition Arousal/Alertness: Awake/alert Behavior During Therapy: Flat affect Overall Cognitive Status: Impaired/Different from baseline Area of Impairment: Orientation;Memory Orientation Level: Person;Time   Memory: Decreased short-term memory         General Comments: reports location as [facility] and operation on legs    General Comments      Exercises     Assessment/Plan    PT Assessment Patient needs continued PT services  PT Problem List Decreased strength;Decreased  activity tolerance;Decreased balance;Decreased mobility;Decreased coordination;Decreased cognition;Decreased knowledge of use of DME          PT Treatment Interventions DME instruction;Functional mobility training;Therapeutic activities;Therapeutic exercise;Balance training;Patient/family education    PT Goals (Current goals can be found in the Care Plan section)  Acute Rehab PT Goals Patient Stated Goal: can you turn on the tv i dont  know how to use it PT Goal Formulation: Patient unable to participate in goal setting Time For Goal Achievement: 12/29/16 Potential to Achieve Goals: Fair    Frequency Min 3X/week   Barriers to discharge        Co-evaluation PT/OT/SLP Co-Evaluation/Treatment: Yes Reason for Co-Treatment: Complexity of the patient's impairments (multi-system involvement) PT goals addressed during session: Mobility/safety with mobility         End of Session   Activity Tolerance: Patient limited by fatigue Patient left: in bed;with call bell/phone within reach;with bed alarm set Nurse Communication: Mobility status         Time: 1610-9604 PT Time Calculation (min) (ACUTE ONLY): 41 min   Charges:   PT Evaluation $PT Eval Moderate Complexity: 1 Procedure PT Treatments $Therapeutic Activity: 8-22 mins   PT G CodesEliseo Gum Brittnae Aschenbrenner 12/15/2016, 4:54 PM 12/15/2016  [place] Bing, PT [phone]  (pager)

## 2016-12-15 NOTE — Evaluation (Signed)
Occupational Therapy Evaluation Patient Details Name: Adrienne [name] MRN: [id number] DOB: [birth year] Today's Date: 12/15/2016    History of Present Illness 81 yo female admitted with distended abdomen/nausea, ileus vs SBO, vent d/c 12/11/16. Acute respiratory failure with hypoxia: Required mechanical ventilator   Past Medical History:  Diagnosis Date  . Anxiety   . Depression   . Diabetes mellitus    ORAL MED  . Frequency of urination   . Headache(784.0) 10/23/2013  . Hypercholesterolemia   . Hypertension   . Hypothyroidism    STATES SHE NO LONGER NEEDS THYROID SUPPLEMENT  . Nocturia   . Normal nuclear stress test Jan 2012   No ischemia. EF 81%  . Obesity   . Osteoarthritis (arthritis due to wear and tear of joints)    PAIN AND OA BOTH KNEES  . Osteopenia   . Shortness of breath    WITH EXERTION  . Sleep apnea    DOES NOT USE CPAP-UNABLE TO TOLERATE  . Urinary frequency       Clinical Impression   PT admitted with respiratory failure secondary to aspiration with abdominal distention. Pt currently with functional limitiations due to the deficits listed below (see OT problem list). PTA was at SNF with hx of dementia.  Pt will benefit from skilled OT to increase their independence and safety with adls and balance to allow discharge SNF.     Follow Up Recommendations  SNF;Supervision/Assistance - 24 hour    Equipment Recommendations  3 in 1 bedside commode;Hospital bed;Wheelchair cushion (measurements OT);Wheelchair (measurements OT);Other (comment) (lift required)    Recommendations for Other Services       Precautions / Restrictions Precautions Precautions: Fall Precaution Comments: watch BP      Mobility Bed Mobility Overal bed mobility: Needs Assistance Bed Mobility: Supine to Sit;Sit to Supine     Supine to sit: Total assist Sit to supine: Max assist;+2 for physical assistance;+2 for safety/equipment   General bed mobility comments: pt requires  cues to sequence task with lack of initiation. in the middle of transfer pt asking to have tv turned on  Transfers Overall transfer level: Needs assistance   Transfers: Sit to/from Stand Sit to Stand: +2 physical assistance;Max assist         General transfer comment: pt requires bed elevated and elevated surface to attempt sit<>stand. pt requries pad at hip for hip extension    Balance Overall balance assessment: Needs assistance Sitting-balance support: Bilateral upper extremity supported;Feet supported Sitting balance-Leahy Scale: Poor     Standing balance support: Bilateral upper extremity supported;During functional activity Standing balance-Leahy Scale: Zero                              ADL Overall ADL's : Needs assistance/impaired Eating/Feeding: Minimal assistance   Grooming: Wash/dry hands;Wash/dry face;Minimal assistance   Upper Body Bathing: Maximal assistance   Lower Body Bathing: Total assistance           Toilet Transfer: +2 for physical assistance;Maximal assistance     Toileting - Clothing Manipulation Details (indicate cue type and reason): total (A) total+2 from RN staff on arrival       General ADL Comments: Pt demonstrates orthostatic BP on transfer. [place]ley DecSara stedy used to Health visitorbalance     Vision     Perception     Praxis      Pertinent Vitals/Pain Pain Assessment: Faces Faces Pain Scale: Hurts little more Pain Location: i hurt  all over ( stomach my legs) Pain Descriptors / Indicators: Grimacing Pain Intervention(s): Premedicated before session;Repositioned;Monitored during session     Hand Dominance Right   Extremity/Trunk Assessment Upper Extremity Assessment Upper Extremity Assessment: Generalized weakness   Lower Extremity Assessment Lower Extremity Assessment: Generalized weakness   Cervical / Trunk Assessment Cervical / Trunk Assessment: Kyphotic   Communication Communication Communication: No difficulties    Cognition Arousal/Alertness: Awake/alert Behavior During Therapy: Flat affect Overall Cognitive Status: Impaired/Different from baseline Area of Impairment: Orientation;Memory Orientation Level: Person;Time   Memory: Decreased short-term memory         General Comments: reports location as [facility] home and operation on legs   General Comments       Exercises       Shoulder Instructions      Home Living Family/patient expects to be discharged to:: Skilled nursing facility                                 Additional Comments: [facility] home       Prior Functioning/Environment Level of Independence: Needs assistance    ADL's / Homemaking Assistance Needed: daughter helps me "a lot"            OT Problem List: Decreased strength;Decreased activity tolerance;Impaired balance (sitting and/or standing);Decreased safety awareness;Decreased knowledge of precautions;Decreased knowledge of use of DME or AE;Pain;Increased edema;Impaired UE functional use;Obesity;Impaired sensation;Cardiopulmonary status limiting activity;Decreased cognition   OT Treatment/Interventions: Self-care/ADL training;Therapeutic exercise;DME and/or AE instruction;Therapeutic activities;Cognitive remediation/compensation;Patient/family education;Balance training    OT Goals(Current goals can be found in the care plan section) Acute Rehab OT Goals Patient Stated Goal: can you turn on the tv i dont know how to use it OT Goal Formulation: With patient Time For Goal Achievement: 12/29/16 Potential to Achieve Goals: Good  OT Frequency: Min 2X/week   Barriers to D/C:            Co-evaluation PT/OT/SLP Co-Evaluation/Treatment: Yes Reason for Co-Treatment: Complexity of the patient's impairments (multi-system involvement);Necessary to address cognition/behavior during functional activity;For patient/therapist safety;To address functional/ADL transfers   OT goals addressed during session:  ADL's and self-care;Strengthening/ROM      End of Session Equipment Utilized During Treatment: Gait belt;Oxygen Nurse Communication: Mobility status;Need for lift equipment;Precautions  Activity Tolerance: Patient limited by pain (nausea and HA upon sitting) Patient left: in bed;with call bell/phone within reach   Time: 1610-9604 OT Time Calculation (min): 41 min Charges:    G-Codes:    Boone Master B 01/14/2017, 11:52 AM  Mateo Flow   OTR/L Pager: [phone] Office: [phone] .

## 2016-12-15 NOTE — Consult Note (Signed)
            [facility] Primary [facility]  12/15/2016  Adrienne [name] 12/23/1935 [id number]   Patient seen at the bedside to identify possible discharge needs. Daughter [name](Angela) arrived during this visit.  Daughter reports [facility] and her care needs have been provided at the nursing home according to daughter. Facility doctor follows-up with patient's needs at the SNF.  Plan for discharge is back to [facility] skilled nursing facility when ready per daughter's report.   For questions, please contact:  Wyatt HasteLorraine Amorina Doerr, BSN, RN- [facility] Primary Care Navigator  Telephone: [phone] [facility]

## 2016-12-15 NOTE — Progress Notes (Signed)
PROGRESS NOTE    Adrienne [name]  WUJ:[id number] DOB: December 26, 1935 DOA: 12/08/2016 PCP: Londell MohPHARR,WALTER DAVIDSON, MD   Brief Narrative: [name] with pmhx significant for DM, HTN, OSA, and dementia who presents from her nursing home with abdominal distention and respiratory distress. Patient was found to haveileus vs SBO, high lactic acid.  She was intubated on 1/24 to 1/27. [facility].  Assessment & Plan:  # Acute respiratory failure with hypoxia: Required mechanical ventilator.  -Chest x-ray with bibasilar atelectasis. Patient was extubated and currently on oxygen by nasal cannula 1-2 Liters. Not on antibiotics. Try to wean off oxygen gradually. Continue supportive care.  #Small bowel obstruction versus ileus: The NG tube was removed as per general surgery on 1/30.  -This morning patient was complaining of abdominal pain and was noticed to be distended. Abdominal x-ray consistent with persistent gastric and small bowel diabetes and poorly presented a small obstruction vs adynamic ileus. Later on patient had large bowel movement. Changed diet to liquid. Continue supportive care. The rectal tube and Foley catheter was removed. -Surgery consult appreciated.  # Hypertension: Continue losartan and metoprolol. Monitor blood pressure.  #Acute kidney injury: Improved. Monitor BMP.  #Lactic acidosis resolved  #Hypokalemia: Serum potassium level 3.6 today.  #Dementia without behavioral disturbance: Continue supportive treatment. -PT, OT evaluation and treatment.  Principal Problem:   Acute respiratory failure with hypoxemia (HCC) Active Problems:   SBO (small bowel obstruction)   Acute renal failure (HCC)   Elevated lactic acid level   Metabolic acidosis   Ileus (HCC)   Ventilator dependent (HCC)   Pressure injury of skin   Oral phase dysphagia  DVT prophylaxis: Changed to Lovenox subcutaneous, once daily dose Code Status: Full code Family Communication:  No family present at bedside Disposition Plan: Likely discharge to SNF in 1-2 days.    Consultants:   General surgery  Procedures: NG tube, rectal tube and Foley catheter removed Antimicrobials: None at this time  Subjective: Patient was seen and examined at bedside. Patient was complaining of abdominal pain and no bowel movement this morning. Reported feeling weak. Denied nausea vomiting chest pain or shortness of breath.  Objective: Vitals:   12/15/16 1100 12/15/16 1101 12/15/16 1104 12/15/16 1110  BP: (!) 151/57 (!) 134/54 (!) 135/52 109/63  Pulse:      Resp:      Temp:      TempSrc:      SpO2:      Weight:      Height:        Intake/Output Summary (Last 24 hours) at 12/15/16 1418 Last data filed at 12/15/16 1147  Gross per 24 hour  Intake          2186.25 ml  Output                0 ml  Net          2186.25 ml   Filed Weights   12/13/16 0411 12/14/16 0928 12/14/16 2004  Weight: 97.4 kg (214 lb 11.7 oz) 99.3 kg (219 lb) 99.6 kg (219 lb 9.3 oz)    Examination:  General exam:Ill-looking elderly female lying on bed, looked mild distress. Respiratory system: Clear bilaterally, respiratory effort normal. No wheezing Cardiovascular system: S1 & S2 heard, RRR.  No pedal edema. Gastrointestinal system: Abdomen firm, distended, bowel sounds positive. Central nervous system: Alert awake and following commands. Extremities: Symmetric 5 x 5 power. Skin: No rashes, lesions or ulcers Psychiatry: Following  simple commands.     Data Reviewed: I have personally reviewed following labs and imaging studies  CBC:  Recent Labs Lab 12/09/16 0724 12/10/16 0337 12/12/16 0229 12/13/16 0523 12/15/16 0703  WBC 5.7 7.3 10.8* 12.0* 20.9*  NEUTROABS  --   --  7.0  --   --   HGB 11.3* 11.1* 11.1* 11.4* 13.3  HCT 34.5* 34.7* 36.8 36.5 41.5  MCV 96.1 97.7 101.7* 99.2 98.1  PLT 156 180 194 189 228   Basic Metabolic Panel:  Recent Labs Lab 12/09/16 0724 12/10/16 0337  12/11/16 0325 12/12/16 0229 12/13/16 0523 12/15/16 0703  NA 142 146* 148* 148* 143 139  K 3.3* 3.1* 3.1* 4.6 4.5 3.6  CL 97* 103 106 114* 111 107  CO2 28 33* 31 24 23 22   GLUCOSE 214* 152* 125* 154* 138* 165*  BUN 58* 42* 28* 18 12 9   CREATININE 1.77* 1.18* 1.04* 0.81 0.80 0.80  CALCIUM 7.7* 8.0* 8.3* 8.2* 8.4* 8.5*  MG 1.8 2.5* 2.3  --   --   --   PHOS 2.1* 3.2 3.3  --   --   --    GFR: Estimated Creatinine Clearance: 66.5 mL/min (by C-G formula based on SCr of 0.8 mg/dL). Liver Function Tests:  Recent Labs Lab 12/08/16 1605  AST 37  ALT 22  ALKPHOS 28*  BILITOT 1.3*  PROT 5.9*  ALBUMIN 2.5*   No results for input(s): LIPASE, AMYLASE in the last 168 hours. No results for input(s): AMMONIA in the last 168 hours. Coagulation Profile: No results for input(s): INR, PROTIME in the last 168 hours. Cardiac Enzymes: No results for input(s): CKTOTAL, CKMB, CKMBINDEX, TROPONINI in the last 168 hours. BNP (last 3 results) No results for input(s): PROBNP in the last 8760 hours. HbA1C: No results for input(s): HGBA1C in the last 72 hours. CBG:  Recent Labs Lab 12/14/16 2002 12/14/16 2358 12/15/16 0433 12/15/16 0749 12/15/16 1129  GLUCAP 140* 164* 176* 144* 166*   Lipid Profile: No results for input(s): CHOL, HDL, LDLCALC, TRIG, CHOLHDL, LDLDIRECT in the last 72 hours. Thyroid Function Tests: No results for input(s): TSH, T4TOTAL, FREET4, T3FREE, THYROIDAB in the last 72 hours. Anemia Panel: No results for input(s): VITAMINB12, FOLATE, FERRITIN, TIBC, IRON, RETICCTPCT in the last 72 hours. Sepsis Labs:  Recent Labs Lab 12/08/16 1605 12/08/16 2127 12/09/16 0724 12/10/16 1123  LATICACIDVEN 4.3* 4.2* 4.6* 1.7    Recent Results (from the past 240 hour(s))  Blood Culture (routine x 2)     Status: None   Collection Time: 12/08/16  2:48 AM  Result Value Ref Range Status   Specimen Description BLOOD LEFT ARM  Final   Special Requests BOTTLES DRAWN AEROBIC AND  ANAEROBIC  Final   Culture NO GROWTH 5 DAYS  Final   Report Status 12/13/2016 FINAL  Final  Blood Culture (routine x 2)     Status: None   Collection Time: 12/08/16  2:53 AM  Result Value Ref Range Status   Specimen Description BLOOD RIGHT HAND  Final   Special Requests BOTTLES DRAWN AEROBIC AND ANAEROBIC  Final   Culture NO GROWTH 5 DAYS  Final   Report Status 12/13/2016 FINAL  Final  Urine culture     Status: None   Collection Time: 12/08/16  5:49 AM  Result Value Ref Range Status   Specimen Description URINE, CATHETERIZED  Final   Special Requests NONE  Final   Culture NO GROWTH  Final   Report Status  12/09/2016 FINAL  Final  MRSA PCR Screening     Status: None   Collection Time: 12/08/16 12:52 PM  Result Value Ref Range Status   MRSA by PCR NEGATIVE NEGATIVE Final    Comment:        The GeneXpert MRSA Assay (FDA approved for NASAL specimens only), is one component of a comprehensive MRSA colonization surveillance program. It is not intended to diagnose MRSA infection nor to guide or monitor treatment for MRSA infections.   C difficile quick scan w PCR reflex     Status: None   Collection Time: 12/12/16  5:07 AM  Result Value Ref Range Status   C Diff antigen NEGATIVE NEGATIVE Final   C Diff toxin NEGATIVE NEGATIVE Final   C Diff interpretation No C. difficile detected.  Final         Radiology Studies: Dg Abd Portable 1v  Result Date: 12/15/2016 CLINICAL DATA:  Abdominal distension. EXAM: PORTABLE ABDOMEN - 1 VIEW COMPARISON:  12/11/2016 FINDINGS: Two supine views. The first image is motion degraded. Both images are degraded by patient size. Gaseous distension of the stomach and small bowel loops. Small bowel loop measures 4.8 cm today versus 5.4 cm maximally on the prior. No gross free intraperitoneal air. Probable contrast in the stomach. Low pelvis excluded. IMPRESSION: Persistent gastric and small bowel dilatation, likely representing partial  obstruction. Adynamic ileus could look similar. Removal of nasogastric tube. Electronically Signed   By: Jeronimo Greaves M.D.   On: 12/15/2016 11:35        Scheduled Meds: . famotidine  20 mg Oral BID  . feeding supplement  1 Container Oral TID BM  . heparin  5,000 Units Subcutaneous Q8H  . insulin aspart  0-9 Units Subcutaneous Q4H  . losartan  50 mg Oral Daily  . mouth rinse  15 mL Mouth Rinse BID  . metoprolol tartrate  12.5 mg Oral BID   Continuous Infusions: . dextrose 5 % and 0.45% NaCl 75 mL/hr at 12/15/16 1143     LOS: 7 days    Dron Jaynie Collins, MD [facility] Pager [phone]  If 7PM-7AM, please contact night-coverage [email or web address] Password [email or web address] 12/15/2016, 2:18 PM

## 2016-12-16 LAB — GLUCOSE, CAPILLARY
GLUCOSE-CAPILLARY: 157 mg/dL — AB (ref 65–99)
GLUCOSE-CAPILLARY: 131 mg/dL — AB (ref 65–99)
GLUCOSE-CAPILLARY: 152 mg/dL — AB (ref 65–99)
GLUCOSE-CAPILLARY: 141 mg/dL — AB (ref 65–99)
Glucose-Capillary: 164 mg/dL — ABNORMAL HIGH (ref 65–99)
Glucose-Capillary: 120 mg/dL — ABNORMAL HIGH (ref 65–99)

## 2016-12-16 LAB — CBC
HCT: 37.7 % (ref 36.0–46.0)
HEMOGLOBIN: 11.8 g/dL — AB (ref 12.0–15.0)
MCH: 31.2 pg (ref 26.0–34.0)
MCHC: 31.3 g/dL (ref 30.0–36.0)
MCV: 99.7 fL (ref 78.0–100.0)
PLATELETS: 178 10*3/uL (ref 150–400)
RBC: 3.78 MIL/uL — ABNORMAL LOW (ref 3.87–5.11)
RDW: 14.9 % (ref 11.5–15.5)
WBC: 19.1 10*3/uL — ABNORMAL HIGH (ref 4.0–10.5)

## 2016-12-16 LAB — RENAL FUNCTION PANEL
ALBUMIN: 2.6 g/dL — AB (ref 3.5–5.0)
ANION GAP: 11 (ref 5–15)
BUN: 14 mg/dL (ref 6–20)
CALCIUM: 8.3 mg/dL — AB (ref 8.9–10.3)
CO2: 21 mmol/L — AB (ref 22–32)
Chloride: 109 mmol/L (ref 101–111)
Creatinine, Ser: 0.78 mg/dL (ref 0.44–1.00)
GFR calc Af Amer: >60 mL/min (ref >60)
GFR calc non Af Amer: >60 mL/min (ref >60)
Glucose, Bld: 160 mg/dL — ABNORMAL HIGH (ref 65–99)
PHOSPHORUS: 3 mg/dL (ref 2.5–4.6)
Potassium: 3.7 mmol/L (ref 3.5–5.1)
SODIUM: 141 mmol/L (ref 135–145)

## 2016-12-16 MED ORDER — ENOXAPARIN SODIUM 40 MG/0.4ML ~~LOC~~ SOLN
40.0000 mg | SUBCUTANEOUS | Status: DC
  Administered 2016-12-16 – 2017-01-01 (×16): 40 mg via SUBCUTANEOUS
  Filled 2016-12-16 (×16): qty 0.4

## 2016-12-16 NOTE — Progress Notes (Signed)
PROGRESS NOTE    Adrienne [name]  ZOX:[id number] DOB: Jan 25, 1936 DOA: 12/08/2016 PCP: [name], MD   Brief Narrative: [name] with pmhx significant for DM, HTN, OSA, and dementia who presents from her nursing home with abdominal distention and respiratory distress. Patient was found to haveileus vs SBO, high lactic acid.  She was intubated on 1/24 to 1/27. Transferred to [facility] on 12/13/2016.  Assessment & Plan:  # Acute respiratory failure with hypoxia: Required mechanical ventilator.  -Chest x-ray with bibasilar atelectasis. Patient was extubated and currently on oxygen by nasal cannula 1-2 Liters. Not on antibiotics. Try to wean off oxygen gradually. Continue supportive care.  #Small bowel obstruction versus ileus: The NG tube was removed as per general surgery on 1/30.  -Repeat abdominal x-ray consistent with persistent small bowel obstruction. Patient is however passing stool frequently. Her abdominal distention is better than yesterday. I will continue to monitor. May need a rectal tube if patient continues to have loose bowel movement. Patient also with decreased morbidity and possibly physical deconditioning. Encourage out of bed to chair and morbidity. PT OT eval is ongoing. Continue with liquid diet today. Continue supportive care. If no improvement I will rediscuss with general surgery. -Evaluated by general surgeon and this hospitalization.  # Hypertension: Continue losartan and metoprolol. Monitor blood pressure.  #Acute kidney injury: Improved. Monitor BMP.  #Lactic acidosis resolved  #Hypokalemia: Serum potassium level 3.7 today.  #Dementia without behavioral disturbance: Continue supportive treatment. -PT, OT evaluation and treatment.  Principal Problem:   Acute respiratory failure with hypoxemia (HCC) Active Problems:   SBO (small bowel obstruction)   Acute renal failure (HCC)   Elevated lactic acid level   Metabolic acidosis   Ileus (HCC)  Ventilator dependent (HCC)   Pressure injury of skin   Oral phase dysphagia  DVT prophylaxis: Changed to Lovenox subcutaneous, once daily dose Code Status: Full code Family Communication: No family present at bedside Disposition Plan: Likely discharge to SNF in 1-2 days.    Consultants:   General surgery  Procedures: NG tube, rectal tube and Foley catheter removed Antimicrobials: None at this time  Subjective: Patient was seen and examined at bedside. Having lows bowel movement and complaining of mild discomfort. Has weakness. No nausea vomiting. No chest pain. Objective: Vitals:   12/15/16 1732 12/15/16 2031 12/16/16 0554 12/16/16 0921  BP: (!) 163/62 (!) 140/59 (!) 119/37 (!) 135/51  Pulse: 74 79 63 73  Resp: 18 (!) 28 18 18   Temp: 98.2 F (36.8 C) 98 F (36.7 C) 97.9 F (36.6 C) 97.8 F (36.6 C)  TempSrc: Oral Oral Oral Oral  SpO2: 98% 98% 97%   Weight:  99.9 kg (220 lb 3.8 oz)    Height:        Intake/Output Summary (Last 24 hours) at 12/16/16 1409 Last data filed at 12/16/16 8119  Gross per 24 hour  Intake           1510.5 ml  Output              100 ml  Net           1410.5 ml   Filed Weights   12/14/16 0928 12/14/16 2004 12/15/16 2031  Weight: 99.3 kg (219 lb) 99.6 kg (219 lb 9.3 oz) 99.9 kg (220 lb 3.8 oz)    Examination:  General exam:Ill-looking female lying on bed, not in distress Respiratory system: Clear bilaterally, respiratory effort normal. No wheezing Cardiovascular system: S1 & S2  heard, RRR.  No pedal edema. Gastrointestinal system: Abdomen is less firm, less distended than yesterday, bowel sound positive.  Central nervous system: Alert awake and following commands. Extremities: Symmetric 5 x 5 power. Skin: No rashes, lesions or ulcers Psychiatry: Following simple commands.     Data Reviewed: I have personally reviewed following labs and imaging studies  CBC:  Recent Labs Lab 12/10/16 0337 12/12/16 0229 12/13/16 0523  12/15/16 0703 12/16/16 0646  WBC 7.3 10.8* 12.0* 20.9* 19.1*  NEUTROABS  --  7.0  --   --   --   HGB 11.1* 11.1* 11.4* 13.3 11.8*  HCT 34.7* 36.8 36.5 41.5 37.7  MCV 97.7 101.7* 99.2 98.1 99.7  PLT 180 194 189 228 178   Basic Metabolic Panel:  Recent Labs Lab 12/10/16 0337 12/11/16 0325 12/12/16 0229 12/13/16 0523 12/15/16 0703 12/16/16 0646  NA 146* 148* 148* 143 139 141  K 3.1* 3.1* 4.6 4.5 3.6 3.7  CL 103 106 114* 111 107 109  CO2 33* 31 24 23 22  21*  GLUCOSE 152* 125* 154* 138* 165* 160*  BUN 42* 28* 18 12 9 14   CREATININE 1.18* 1.04* 0.81 0.80 0.80 0.78  CALCIUM 8.0* 8.3* 8.2* 8.4* 8.5* 8.3*  MG 2.5* 2.3  --   --   --   --   PHOS 3.2 3.3  --   --   --  3.0   GFR: Estimated Creatinine Clearance: 66.6 mL/min (by C-G formula based on SCr of 0.78 mg/dL). Liver Function Tests:  Recent Labs Lab 12/16/16 0646  ALBUMIN 2.6*   No results for input(s): LIPASE, AMYLASE in the last 168 hours. No results for input(s): AMMONIA in the last 168 hours. Coagulation Profile: No results for input(s): INR, PROTIME in the last 168 hours. Cardiac Enzymes: No results for input(s): CKTOTAL, CKMB, CKMBINDEX, TROPONINI in the last 168 hours. BNP (last 3 results) No results for input(s): PROBNP in the last 8760 hours. HbA1C: No results for input(s): HGBA1C in the last 72 hours. CBG:  Recent Labs Lab 12/15/16 2033 12/15/16 2342 12/16/16 0344 12/16/16 0756 12/16/16 1217  GLUCAP 159* 183* 164* 131* 152*   Lipid Profile: No results for input(s): CHOL, HDL, LDLCALC, TRIG, CHOLHDL, LDLDIRECT in the last 72 hours. Thyroid Function Tests: No results for input(s): TSH, T4TOTAL, FREET4, T3FREE, THYROIDAB in the last 72 hours. Anemia Panel: No results for input(s): VITAMINB12, FOLATE, FERRITIN, TIBC, IRON, RETICCTPCT in the last 72 hours. Sepsis Labs:  Recent Labs Lab 12/10/16 1123  LATICACIDVEN 1.7    Recent Results (from the past 240 hour(s))  Blood Culture (routine x 2)      Status: None   Collection Time: 12/08/16  2:48 AM  Result Value Ref Range Status   Specimen Description BLOOD LEFT ARM  Final   Special Requests BOTTLES DRAWN AEROBIC AND ANAEROBIC  Final   Culture NO GROWTH 5 DAYS  Final   Report Status 12/13/2016 FINAL  Final  Blood Culture (routine x 2)     Status: None   Collection Time: 12/08/16  2:53 AM  Result Value Ref Range Status   Specimen Description BLOOD RIGHT HAND  Final   Special Requests BOTTLES DRAWN AEROBIC AND ANAEROBIC  Final   Culture NO GROWTH 5 DAYS  Final   Report Status 12/13/2016 FINAL  Final  Urine culture     Status: None   Collection Time: 12/08/16  5:49 AM  Result Value Ref Range Status   Specimen Description URINE, CATHETERIZED  Final   Special Requests NONE  Final   Culture NO GROWTH  Final   Report Status 12/09/2016 FINAL  Final  MRSA PCR Screening     Status: None   Collection Time: 12/08/16 12:52 PM  Result Value Ref Range Status   MRSA by PCR NEGATIVE NEGATIVE Final    Comment:        The GeneXpert MRSA Assay (FDA approved for NASAL specimens only), is one component of a comprehensive MRSA colonization surveillance program. It is not intended to diagnose MRSA infection nor to guide or monitor treatment for MRSA infections.   C difficile quick scan w PCR reflex     Status: None   Collection Time: 12/12/16  5:07 AM  Result Value Ref Range Status   C Diff antigen NEGATIVE NEGATIVE Final   C Diff toxin NEGATIVE NEGATIVE Final   C Diff interpretation No C. difficile detected.  Final         Radiology Studies: Dg Abd Portable 1v  Result Date: 12/15/2016 CLINICAL DATA:  Abdominal distension. EXAM: PORTABLE ABDOMEN - 1 VIEW COMPARISON:  12/11/2016 FINDINGS: Two supine views. The first image is motion degraded. Both images are degraded by patient size. Gaseous distension of the stomach and small bowel loops. Small bowel loop measures 4.8 cm today versus 5.4 cm maximally on the prior. No gross  free intraperitoneal air. Probable contrast in the stomach. Low pelvis excluded. IMPRESSION: Persistent gastric and small bowel dilatation, likely representing partial obstruction. Adynamic ileus could look similar. Removal of nasogastric tube. Electronically Signed   By: Jeronimo GreavesKyle  Talbot M.D.   On: 12/15/2016 11:35        Scheduled Meds: . famotidine  20 mg Oral BID  . feeding supplement  1 Container Oral TID BM  . heparin  5,000 Units Subcutaneous Q8H  . insulin aspart  0-9 Units Subcutaneous Q4H  . losartan  50 mg Oral Daily  . mouth rinse  15 mL Mouth Rinse BID  . metoprolol tartrate  12.5 mg Oral BID   Continuous Infusions: . dextrose 5 % and 0.9 % NaCl with KCl 40 mEq/L 75 mL/hr at 12/15/16 1506     LOS: 8 days    Louanna Vanliew Jaynie CollinsPrasad Rashauna Tep, MD [facility] Pager [phone]  If 7PM-7AM, please contact night-coverage [email or web address] Password [email or web address] 12/16/2016, 2:09 PM

## 2016-12-16 NOTE — Progress Notes (Signed)
Physical Therapy Treatment Patient Details Name: Adrienne [name] MRN: [id number] DOB: 16-Nov-1935 Today's Date: 12/16/2016    History of Present Illness 81 yo female admitted with distended abdomen/nausea, ileus vs SBO, vent d/c 12/11/16. Acute respiratory failure with hypoxia: Required mechanical ventilator     PT Comments    Pt needing significant assist for bed mobility, scoot and pivot transfer bed to chair.  Follow Up Recommendations  SNF     Equipment Recommendations  Other (comment) (TBA)    Recommendations for Other Services       Precautions / Restrictions Precautions Precautions: Fall Precaution Comments: watch BP    Mobility  Bed Mobility Overal bed mobility: Needs Assistance Bed Mobility: Supine to Sit     Supine to sit: Max assist;+2 for physical assistance     General bed mobility comments: cues for sequencing, Truncal assist to come up and forward on L elbow.  Needed total assist to scoot, pt scared to slide forward.  Transfers Overall transfer level: Needs assistance   Transfers: Stand Pivot Transfers   Stand pivot transfers: Max assist;+2 physical assistance       General transfer comment: used face to face pivot transfer with 2 person positionin assist.  Pt needed multiple repostioning in the chair with pad assist  Ambulation/Gait             General Gait Details: unable   Stairs            Wheelchair Mobility    Modified Rankin (Stroke Patients Only)       Balance Overall balance assessment: Needs assistance Sitting-balance support: Bilateral upper extremity supported;Feet supported Sitting balance-Leahy Scale: Poor       Standing balance-Leahy Scale: Zero                      Cognition Arousal/Alertness: Awake/alert Behavior During Therapy: Anxious;Flat affect Overall Cognitive Status: Impaired/Different from baseline Area of Impairment: Orientation;Memory;Awareness;Problem solving Orientation Level:  Situation;Place;Time   Memory: Decreased short-term memory     Awareness: Intellectual Problem Solving: Slow processing;Requires tactile cues;Requires verbal cues      Exercises      General Comments        Pertinent Vitals/Pain Pain Assessment: No/denies pain    Home Living                      Prior Function            PT Goals (current goals can now be found in the care plan section) Acute Rehab PT Goals PT Goal Formulation: Patient unable to participate in goal setting Time For Goal Achievement: 12/29/16 Potential to Achieve Goals: Fair Progress towards PT goals: Progressing toward goals    Frequency    Min 3X/week      PT Plan Current plan remains appropriate    Co-evaluation             End of Session     Patient left: in chair;with call bell/phone within reach;with chair alarm set     Time: 1510-1534 PT Time Calculation (min) (ACUTE ONLY): 24 min  Charges:  $Therapeutic Activity: 23-37 mins                    G CodesEliseo Gum:      Ayodele Hartsock V Loren Vicens 12/16/2016, 5:33 PM 12/16/2016  [place] BingKen Azekiel Cremer, PT [phone]  (pager)

## 2016-12-17 LAB — GLUCOSE, CAPILLARY
GLUCOSE-CAPILLARY: 127 mg/dL — AB (ref 65–99)
GLUCOSE-CAPILLARY: 130 mg/dL — AB (ref 65–99)
Glucose-Capillary: 116 mg/dL — ABNORMAL HIGH (ref 65–99)
Glucose-Capillary: 142 mg/dL — ABNORMAL HIGH (ref 65–99)
Glucose-Capillary: 160 mg/dL — ABNORMAL HIGH (ref 65–99)

## 2016-12-17 LAB — RENAL FUNCTION PANEL
Albumin: 2.6 g/dL — ABNORMAL LOW (ref 3.5–5.0)
Anion gap: 8 (ref 5–15)
BUN: 11 mg/dL (ref 6–20)
CHLORIDE: 116 mmol/L — AB (ref 101–111)
CO2: 18 mmol/L — AB (ref 22–32)
CREATININE: 0.67 mg/dL (ref 0.44–1.00)
Calcium: 8.4 mg/dL — ABNORMAL LOW (ref 8.9–10.3)
Glucose, Bld: 147 mg/dL — ABNORMAL HIGH (ref 65–99)
POTASSIUM: 4.1 mmol/L (ref 3.5–5.1)
Phosphorus: 2.5 mg/dL (ref 2.5–4.6)
Sodium: 142 mmol/L (ref 135–145)

## 2016-12-17 LAB — CBC
HEMATOCRIT: 33.4 % — AB (ref 36.0–46.0)
Hemoglobin: 10.3 g/dL — ABNORMAL LOW (ref 12.0–15.0)
MCH: 30.7 pg (ref 26.0–34.0)
MCHC: 30.8 g/dL (ref 30.0–36.0)
MCV: 99.7 fL (ref 78.0–100.0)
Platelets: 209 10*3/uL (ref 150–400)
RBC: 3.35 MIL/uL — ABNORMAL LOW (ref 3.87–5.11)
RDW: 14.9 % (ref 11.5–15.5)
WBC: 14.1 10*3/uL — ABNORMAL HIGH (ref 4.0–10.5)

## 2016-12-17 NOTE — Progress Notes (Signed)
PROGRESS NOTE    Adrienne [name]  BJY:[id number] DOB: Nov 20, 1935 DOA: 12/08/2016 PCP: Londell MohPHARR,WALTER DAVIDSON, [name]   Brief Narrative: [name] with pmhx significant for DM, HTN, OSA, and dementia who presents from her nursing home with abdominal distention and respiratory distress. Patient was found to haveileus vs SBO, high lactic acid.  She was intubated on 1/24 to 1/27. [facility].  Assessment & Plan:  # Acute respiratory failure with hypoxia: Required mechanical ventilator.  -Chest x-ray with bibasilar atelectasis. Patient was extubated and currently on oxygen by nasal cannula 2 Liters. Not on antibiotics. Try to wean off oxygen gradually. Continue supportive care.  #Small bowel obstruction versus ileus: NG tube was removed. Evaluated by general surgery. Today her abdomen is less distended and she is out of bed to chair. She was tolerating liquid diet well, we will advance to soft diet. She is passing his stool, currently has rectal tube because of losing stool. We'll continue to provide supportive care. Electrolytes are stable. -Stool C. difficile negative.  # Hypertension: Continue losartan. Monitor blood pressure.  #Acute kidney injury: Improved. Monitor BMP.  #Lactic acidosis resolved  #Hypokalemia: Serum potassium level acceptable.  #Dementia without behavioral disturbance: Continue supportive treatment. -PT OT recommended SNF. Social worker consult for SNF discharge. Discussed with the social worker, case Production designer, theatre/television/filmmanager and nursing staff during morning round.  Principal Problem:   Acute respiratory failure with hypoxemia (HCC) Active Problems:   Small bowel obstruction   Acute renal failure (HCC)   Elevated lactic acid level   Metabolic acidosis   Ileus (HCC)   Ventilator dependent (HCC)   Pressure injury of skin   Oral phase dysphagia  DVT prophylaxis: Heparin subcutaneous Code Status: Full code Family Communication: No family present at  bedside Disposition Plan: Likely discharge to SNF in 1-2 days.    Consultants:   General surgery  Procedures: NG tube placement Antimicrobials: None at this time  Subjective: Patient was seen and examined at bedside. Abdomen pain is better. Passing stool loose. Denied chest pain, shortness of breath, nausea or vomiting.  Objective: Vitals:   12/16/16 1808 12/16/16 2100 12/17/16 0500 12/17/16 1035  BP: (!) 141/55 (!) 131/48 (!) 143/83 (!) 152/59  Pulse: 75 80 82 75  Resp: 18 20 20 18   Temp: 98 F (36.7 C) 98.5 F (36.9 C) 98.1 F (36.7 C) 98 F (36.7 C)  TempSrc: Oral Oral Oral Oral  SpO2: 98% 99% 100% 100%  Weight:  106.1 kg (234 lb)    Height:        Intake/Output Summary (Last 24 hours) at 12/17/16 1525 Last data filed at 12/17/16 0900  Gross per 24 hour  Intake             1375 ml  Output                2 ml  Net             1373 ml   Filed Weights   12/14/16 2004 12/15/16 2031 12/16/16 2100  Weight: 99.6 kg (219 lb 9.3 oz) 99.9 kg (220 lb 3.8 oz) 106.1 kg (234 lb)    Examination:  General exam: Sitting on chair, looks better today.  Respiratory system: Clear bilateral. Respiratory effort normal. No wheezing or crackle Cardiovascular system: S1 & S2 heard, RRR.  No pedal edema. Gastrointestinal system: Abdomen soft, nontender, less distended. Bowel sound positive. Central nervous system: Alert awake and following commands. Extremities: Symmetric 5  x 5 power. Skin: No rashes, lesions or ulcers Psychiatry: Following simple commands.     Data Reviewed: I have personally reviewed following labs and imaging studies  CBC:  Recent Labs Lab 12/12/16 0229 12/13/16 0523 12/15/16 0703 12/16/16 0646 12/17/16 0645  WBC 10.8* 12.0* 20.9* 19.1* 14.1*  NEUTROABS 7.0  --   --   --   --   HGB 11.1* 11.4* 13.3 11.8* 10.3*  HCT 36.8 36.5 41.5 37.7 33.4*  MCV 101.7* 99.2 98.1 99.7 99.7  PLT 194 189 228 178 209   Basic Metabolic Panel:  Recent Labs Lab  12/11/16 0325 12/12/16 0229 12/13/16 0523 12/15/16 0703 12/16/16 0646 12/17/16 1042  NA 148* 148* 143 139 141 142  K 3.1* 4.6 4.5 3.6 3.7 4.1  CL 106 114* 111 107 109 116*  CO2 31 24 23 22  21* 18*  GLUCOSE 125* 154* 138* 165* 160* 147*  BUN 28* 18 12 9 14 11   CREATININE 1.04* 0.81 0.80 0.80 0.78 0.67  CALCIUM 8.3* 8.2* 8.4* 8.5* 8.3* 8.4*  MG 2.3  --   --   --   --   --   PHOS 3.3  --   --   --  3.0 2.5   GFR: Estimated Creatinine Clearance: 68.9 mL/min (by C-G formula based on SCr of 0.67 mg/dL). Liver Function Tests:  Recent Labs Lab 12/16/16 0646 12/17/16 1042  ALBUMIN 2.6* 2.6*   No results for input(s): LIPASE, AMYLASE in the last 168 hours. No results for input(s): AMMONIA in the last 168 hours. Coagulation Profile: No results for input(s): INR, PROTIME in the last 168 hours. Cardiac Enzymes: No results for input(s): CKTOTAL, CKMB, CKMBINDEX, TROPONINI in the last 168 hours. BNP (last 3 results) No results for input(s): PROBNP in the last 8760 hours. HbA1C: No results for input(s): HGBA1C in the last 72 hours. CBG:  Recent Labs Lab 12/16/16 1712 12/16/16 2102 12/17/16 0020 12/17/16 0807 12/17/16 1150  GLUCAP 141* 120* 142* 160* 127*   Lipid Profile: No results for input(s): CHOL, HDL, LDLCALC, TRIG, CHOLHDL, LDLDIRECT in the last 72 hours. Thyroid Function Tests: No results for input(s): TSH, T4TOTAL, FREET4, T3FREE, THYROIDAB in the last 72 hours. Anemia Panel: No results for input(s): VITAMINB12, FOLATE, FERRITIN, TIBC, IRON, RETICCTPCT in the last 72 hours. Sepsis Labs: No results for input(s): PROCALCITON, LATICACIDVEN in the last 168 hours.  Recent Results (from the past 240 hour(s))  Blood Culture (routine x 2)     Status: None   Collection Time: 12/08/16  2:48 AM  Result Value Ref Range Status   Specimen Description BLOOD LEFT ARM  Final   Special Requests BOTTLES DRAWN AEROBIC AND ANAEROBIC  Final   Culture NO GROWTH 5 DAYS  Final    Report Status 12/13/2016 FINAL  Final  Blood Culture (routine x 2)     Status: None   Collection Time: 12/08/16  2:53 AM  Result Value Ref Range Status   Specimen Description BLOOD RIGHT HAND  Final   Special Requests BOTTLES DRAWN AEROBIC AND ANAEROBIC  Final   Culture NO GROWTH 5 DAYS  Final   Report Status 12/13/2016 FINAL  Final  Urine culture     Status: None   Collection Time: 12/08/16  5:49 AM  Result Value Ref Range Status   Specimen Description URINE, CATHETERIZED  Final   Special Requests NONE  Final   Culture NO GROWTH  Final   Report Status 12/09/2016 FINAL  Final  MRSA  PCR Screening     Status: None   Collection Time: 12/08/16 12:52 PM  Result Value Ref Range Status   MRSA by PCR NEGATIVE NEGATIVE Final    Comment:        The GeneXpert MRSA Assay (FDA approved for NASAL specimens only), is one component of a comprehensive MRSA colonization surveillance program. It is not intended to diagnose MRSA infection nor to guide or monitor treatment for MRSA infections.   C difficile quick scan w PCR reflex     Status: None   Collection Time: 12/12/16  5:07 AM  Result Value Ref Range Status   C Diff antigen NEGATIVE NEGATIVE Final   C Diff toxin NEGATIVE NEGATIVE Final   C Diff interpretation No C. difficile detected.  Final         Radiology Studies: No results found.      Scheduled Meds: . enoxaparin (LOVENOX) injection  40 mg Subcutaneous Q24H  . famotidine  20 mg Oral BID  . feeding supplement  1 Container Oral TID BM  . insulin aspart  0-9 Units Subcutaneous Q4H  . losartan  50 mg Oral Daily  . mouth rinse  15 mL Mouth Rinse BID  . metoprolol tartrate  12.5 mg Oral BID   Continuous Infusions: . dextrose 5 % and 0.9 % NaCl with KCl 40 mEq/L 75 mL/hr at 12/17/16 1007     LOS: 9 days    Adrienne Sze Jaynie Collins, [name] [facility] Pager [phone]  If 7PM-7AM, please contact night-coverage [email or web address] Password [facility] 12/17/2016, 3:25 PM

## 2016-12-17 NOTE — Progress Notes (Signed)
Occupational Therapy Treatment Patient Details Name: Adrienne [name] MRN: [id number] DOB: 1936/05/09 Today's Date: 12/17/2016    History of present illness 81 yo female admitted with distended abdomen/nausea, ileus vs SBO, vent d/c 12/11/16. Acute respiratory failure with hypoxia: Required mechanical ventilator    OT comments  Pt tolerated OOB to chair at this time. Pt required use of the lift due to limited bed level (A). Pt with cough and wheezing to breathing supine. Pt upright in chair and reports this is better upon positioning. Pt more confused compared to evaluation at this time. Pt with baseline dementia but pt repeating same statement several times.    Follow Up Recommendations  SNF;Supervision/Assistance - 24 hour    Equipment Recommendations  3 in 1 bedside commode;Hospital bed;Wheelchair cushion (measurements OT);Wheelchair (measurements OT);Other (comment)    Recommendations for Other Services      Precautions / Restrictions Precautions Precautions: Fall Precaution Comments: watch BP       Mobility Bed Mobility Overal bed mobility: Needs Assistance Bed Mobility: Rolling Rolling: +2 for physical assistance;Max assist         General bed mobility comments: Pt requires pad used to roll R and L side to position lift pad. Pt tolerated welled . Pt anxious initially with lift but once in lift states "i just got to get use to it. I am okay"   Transfers                 General transfer comment: hoyer lift at this time OOB to chair for self feeding. pt unable to log roll with one therapist so lift required for safety    Balance                                   ADL Overall ADL's : Needs assistance/impaired Eating/Feeding: Minimal assistance;Sitting Eating/Feeding Details (indicate cue type and reason): pt need spoon and bowl handed to patient off tray to initiate self feeding. pt lacks initiation to start task but once started engaged in task     Upper Body Bathing: Maximal assistance   Lower Body Bathing: Total assistance                         General ADL Comments: Pt requesting OOB to chair. pt provided hoyer lift to help transfer to chair due to [facility] not [facility] and hoyer pad in room for [employer]. Rn (A)ing with log roll L to don lift pad. Pt unable to rotate trunk one person (A) to place pad. Pt with extra pads in base of chair to prevent skin break down in sitting for breakfast. Advised OOB max 2 hours in chair due to risk for skin break down. Pt requires pillow under heels at this time.       Vision                     Perception     Praxis      Cognition   Behavior During Therapy: Flat affect Overall Cognitive Status: Impaired/Different from baseline                  General Comments: pt at baseline has dementia. Pt however this session more confused that previous session. pt unaware of breakfast present and it was on the Left side of bed. Pt repeating same question multiple times.     Extremity/Trunk Assessment  Exercises     Shoulder Instructions       General Comments      Pertinent Vitals/ Pain       Pain Assessment: Faces Faces Pain Scale: Hurts little more Pain Location: my stomach and my breathing  Pain Descriptors / Indicators: Grimacing Pain Intervention(s): Monitored during session;Premedicated before session;Repositioned  Home Living                                          Prior Functioning/Environment              Frequency  Min 2X/week        Progress Toward Goals  OT Goals(current goals can now be found in the care plan section)  Progress towards OT goals: Progressing toward goals  Acute Rehab OT Goals Patient Stated Goal: can you help me get oob? OT Goal Formulation: With patient Time For Goal Achievement: 12/29/16 Potential to Achieve Goals: Good ADL Goals Pt Will Perform Grooming: with  supervision;sitting Pt Will Perform Upper Body Bathing: with supervision;sitting Pt Will Transfer to Toilet: with +2 assist;with min assist;bedside commode;stand pivot transfer Additional ADL Goal #1: Pt will complete bed mobility total +2 min (A) as precursor to adls.   Plan Discharge plan remains appropriate    Co-evaluation                 End of Session Equipment Utilized During Treatment: Oxygen;Other (comment) (hoyer lift)   Activity Tolerance Patient tolerated treatment well   Patient Left in chair;with call bell/phone within reach;with chair alarm set;with family/visitor present   Nurse Communication Mobility status;Need for lift equipment;Precautions        Time: 1610-9604 OT Time Calculation (min): 23 min  Charges: OT General Charges $OT Visit: 1 Procedure OT Treatments $Self Care/Home Management : 23-37 mins  Adrienne [name] 12/17/2016, 9:49 AM   Adrienne [name]   OTR/L Pager: [phone] Office: [phone] .

## 2016-12-17 NOTE — Clinical Social Work Note (Deleted)
[facility] and CSW will f/u with patient/family regarding return to facility at discharge.  Genelle BalVanessa Sarkis Rhines, MSW, LCSW Licensed Clinical Social Worker Clinical Social Work Department [facility] [phone]

## 2016-12-17 NOTE — Clinical Social Work Note (Signed)
Clinical Social Work Assessment  Patient Details  Name: Adrienne [name] MRN: [id number] Date of Birth: 1936/03/01  Date of referral:  12/08/16               Reason for consult:  Facility Placement ([facility])                Permission sought to share information with:  Family Supports Permission granted to share information::  No (Daughter at the bedside and patient allowed daughter to talk with CSW)  Name::     Jesusita Okangela Robinson  Agency::     Relationship::  Daughter  Contact Information:  [phone] (mobile) and [phone] (h)  Housing/Transportation Living arrangements for the past 2 months:  Skilled Nursing Facility Source of Information:  Adult Children (Daughter Marylene Landngela at the bedside) Patient Interpreter Needed:  None Criminal Activity/Legal Involvement Pertinent to Current Situation/Hospitalization:  No - Comment as needed Significant Relationships:  Adult Children Lives with:  Facility Resident Do you feel safe going back to the place where you live?  Yes Need for family participation in patient care:  Yes (Comment)  Care giving concerns:  None expressed by daughter regarding patient's care at facility.  Social Worker assessment / plan:  Daughter Marylene Landngela confirmed that patient is a LTC (3 years) resident [facility], and the discharge plan is back to facility.  Patient was lying in bed and is currently disoriented to situation. She made eye contact with CSW upon entering the room, but did not engage int he conversation.  Employment status:  Retired Engineer, miningnsurance information:  Medicare, Managed Care PT Recommendations:  Skilled Nursing Facility Information / Referral to community resources:  Other (Comment Required) (None needed or requested as patient from skilled facility)  Patient/Family's Response to care: No concerns expressed regarding patient's care during hospitalization.  Patient/Family's Understanding of and Emotional Response to Diagnosis,  Current Treatment, and Prognosis:  Not discussed.  Emotional Assessment Appearance:  Appears stated age Attitude/Demeanor/Rapport:  Other (Appropriate, quiet) Affect (typically observed):  Appropriate Orientation:  Oriented to Place, Oriented to Self Alcohol / Substance use:  Never Used Psych involvement (Current and /or in the community):  No (Comment)  Discharge Needs  Concerns to be addressed:  Discharge Planning Concerns Readmission within the last 30 days:  No Current discharge risk:  None Barriers to Discharge:  No Barriers Identified   Cristobal GoldmannCrawford, Delaila Nand Bradley, LCSW 12/17/2016, 5:06 PM

## 2016-12-17 NOTE — Care Management Important Message (Signed)
Important Message  Patient Details  Name: Adrienne [name] MRN: [id number] Date of Birth: 11/05/1936   Medicare Important Message Given:  Yes    Ragan Duhon 12/17/2016, 12:06 PM

## 2016-12-18 LAB — GLUCOSE, CAPILLARY
GLUCOSE-CAPILLARY: 108 mg/dL — AB (ref 65–99)
GLUCOSE-CAPILLARY: 110 mg/dL — AB (ref 65–99)
GLUCOSE-CAPILLARY: 119 mg/dL — AB (ref 65–99)
GLUCOSE-CAPILLARY: 148 mg/dL — AB (ref 65–99)
Glucose-Capillary: 117 mg/dL — ABNORMAL HIGH (ref 65–99)
Glucose-Capillary: 124 mg/dL — ABNORMAL HIGH (ref 65–99)
Glucose-Capillary: 159 mg/dL — ABNORMAL HIGH (ref 65–99)

## 2016-12-18 MED ORDER — LORAZEPAM 2 MG/ML IJ SOLN
0.5000 mg | Freq: Every evening | INTRAMUSCULAR | Status: DC | PRN
  Administered 2016-12-18 – 2017-01-01 (×9): 0.5 mg via INTRAVENOUS
  Filled 2016-12-18 (×12): qty 1

## 2016-12-18 NOTE — Progress Notes (Signed)
PROGRESS NOTE    Adrienne [name]  ZOX:[id number] DOB: Aug 22, 1936 DOA: 12/08/2016 PCP: [name], MD   Brief Narrative: [name] with pmhx significant for DM, HTN, OSA, and dementia who presents from her nursing home with abdominal distention and respiratory distress. Patient was found to haveileus vs SBO, high lactic acid.  She was intubated on 1/24 to 1/27. Transferred to [facility] from PCCM on 12/13/2016.  Assessment & Plan:  # Acute respiratory failure with hypoxia: Required mechanical ventilator.  -Chest x-ray with bibasilar atelectasis. Patient was extubated and currently on oxygen by nasal cannula 2 Liters. Not on antibiotics. Try to wean off oxygen gradually. Continue supportive care.  #Small bowel obstruction versus ileus: NG tube was removed. Evaluated by general surgery.  -Patient was somnolent this morning likely because of Ativan. Abdomen is soft and distention remained stable. Has rectal tube. Continue supportive care and encourage oral intake. As per RN, patient is able to drink and tolerate diet well. I'll discontinue IV fluid. I would repeat abdominal x-ray tomorrow morning.  -Stool C. difficile negative.  # Hypertension: Continue losartan. Monitor blood pressure.  #Acute kidney injury: Improved. Monitor BMP.  #Lactic acidosis resolved  #Hypokalemia: Serum potassium level acceptable.  #Dementia without behavioral disturbance: Continue supportive treatment. -PT OT recommended SNF. Social worker consult for SNF discharge. Discussed with the social worker, case Production designer, theatre/television/film and nursing staff during morning round. -Somnolent today likely due to Ativan. She required Ativan earlier today morning because of agitation as per orders. I will change low-dose Ativan only at bedtime as needed for agitation. Given no significant clinical improvement, dementia I will consult palliative care team per culture care discussion.  Principal Problem:   Acute respiratory failure with  hypoxemia (HCC) Active Problems:   Small bowel obstruction   Acute renal failure (HCC)   Elevated lactic acid level   Metabolic acidosis   Ileus (HCC)   Ventilator dependent (HCC)   Pressure injury of skin   Oral phase dysphagia  DVT prophylaxis: Lovenox subcutaneous Code Status: Full code Family Communication: No family present at bedside Disposition Plan: Likely discharge to SNF in 1-2 days.    Consultants:   General surgery  Procedures: NG tube placement Antimicrobials: None at this time  Subjective: Patient was seen and examined at bedside. Patient was somnolent this morning. She was alert awake and able to answer some questions. Denied nausea vomiting. No chest pain or shortness of breath. No abdominal pain.  Objective: Vitals:   12/17/16 1716 12/17/16 2236 12/18/16 0720 12/18/16 1023  BP: 126/71 (!) 108/56 106/60 (!) 150/58  Pulse: 73 76 74 71  Resp: 18 18 18 20   Temp: 98.2 F (36.8 C) 98.1 F (36.7 C) 98.6 F (37 C) 98.5 F (36.9 C)  TempSrc: Oral Oral Oral   SpO2: 100% 100% 96% 100%  Weight:  98.9 kg (218 lb)    Height:        Intake/Output Summary (Last 24 hours) at 12/18/16 1126 Last data filed at 12/18/16 1034  Gross per 24 hour  Intake              360 ml  Output              704 ml  Net             -344 ml   Filed Weights   12/15/16 2031 12/16/16 2100 12/17/16 2236  Weight: 99.9 kg (220 lb 3.8 oz) 106.1 kg (234 lb) 98.9 kg (218 lb)  Examination:  General exam: Lying in bed comfortable, alert awake but somnolent. Respiratory system: Clear bilateral, respiratory effort normal. Cardiovascular system: S1 & S2 heard, RRR.  No pedal edema. Gastrointestinal system: Abdomen is soft, nontender, distention of about the same. Bowel sound positive. Has a rectal tube. Central nervous system: Alert awake and following commands. Extremities: Symmetric 5 x 5 power. Skin: No rashes, lesions or ulcers Psychiatry: Alert awake but somnolent today.      Data Reviewed: I have personally reviewed following labs and imaging studies  CBC:  Recent Labs Lab 12/12/16 0229 12/13/16 0523 12/15/16 0703 12/16/16 0646 12/17/16 0645  WBC 10.8* 12.0* 20.9* 19.1* 14.1*  NEUTROABS 7.0  --   --   --   --   HGB 11.1* 11.4* 13.3 11.8* 10.3*  HCT 36.8 36.5 41.5 37.7 33.4*  MCV 101.7* 99.2 98.1 99.7 99.7  PLT 194 189 228 178 209   Basic Metabolic Panel:  Recent Labs Lab 12/12/16 0229 12/13/16 0523 12/15/16 0703 12/16/16 0646 12/17/16 1042  NA 148* 143 139 141 142  K 4.6 4.5 3.6 3.7 4.1  CL 114* 111 107 109 116*  CO2 24 23 22  21* 18*  GLUCOSE 154* 138* 165* 160* 147*  BUN 18 12 9 14 11   CREATININE 0.81 0.80 0.80 0.78 0.67  CALCIUM 8.2* 8.4* 8.5* 8.3* 8.4*  PHOS  --   --   --  3.0 2.5   GFR: Estimated Creatinine Clearance: 66.2 mL/min (by C-G formula based on SCr of 0.67 mg/dL). Liver Function Tests:  Recent Labs Lab 12/16/16 0646 12/17/16 1042  ALBUMIN 2.6* 2.6*   No results for input(s): LIPASE, AMYLASE in the last 168 hours. No results for input(s): AMMONIA in the last 168 hours. Coagulation Profile: No results for input(s): INR, PROTIME in the last 168 hours. Cardiac Enzymes: No results for input(s): CKTOTAL, CKMB, CKMBINDEX, TROPONINI in the last 168 hours. BNP (last 3 results) No results for input(s): PROBNP in the last 8760 hours. HbA1C: No results for input(s): HGBA1C in the last 72 hours. CBG:  Recent Labs Lab 12/17/16 1714 12/17/16 2024 12/18/16 0011 12/18/16 0423 12/18/16 0808  GLUCAP 130* 116* 117* 124* 148*   Lipid Profile: No results for input(s): CHOL, HDL, LDLCALC, TRIG, CHOLHDL, LDLDIRECT in the last 72 hours. Thyroid Function Tests: No results for input(s): TSH, T4TOTAL, FREET4, T3FREE, THYROIDAB in the last 72 hours. Anemia Panel: No results for input(s): VITAMINB12, FOLATE, FERRITIN, TIBC, IRON, RETICCTPCT in the last 72 hours. Sepsis Labs: No results for input(s): PROCALCITON,  LATICACIDVEN in the last 168 hours.  Recent Results (from the past 240 hour(s))  MRSA PCR Screening     Status: None   Collection Time: 12/08/16 12:52 PM  Result Value Ref Range Status   MRSA by PCR NEGATIVE NEGATIVE Final    Comment:        The GeneXpert MRSA Assay (FDA approved for NASAL specimens only), is one component of a comprehensive MRSA colonization surveillance program. It is not intended to diagnose MRSA infection nor to guide or monitor treatment for MRSA infections.   C difficile quick scan w PCR reflex     Status: None   Collection Time: 12/12/16  5:07 AM  Result Value Ref Range Status   C Diff antigen NEGATIVE NEGATIVE Final   C Diff toxin NEGATIVE NEGATIVE Final   C Diff interpretation No C. difficile detected.  Final         Radiology Studies: No results found.  Scheduled Meds: . enoxaparin (LOVENOX) injection  40 mg Subcutaneous Q24H  . famotidine  20 mg Oral BID  . feeding supplement  1 Container Oral TID BM  . insulin aspart  0-9 Units Subcutaneous Q4H  . losartan  50 mg Oral Daily  . mouth rinse  15 mL Mouth Rinse BID  . metoprolol tartrate  12.5 mg Oral BID   Continuous Infusions:    LOS: 10 days    Dron Jaynie CollinsPrasad Bhandari, MD [facility] Pager [phone]  If 7PM-7AM, please contact night-coverage [email or web address] Password [email or web address] 12/18/2016, 11:26 AM

## 2016-12-18 NOTE — Progress Notes (Signed)
Paged Dr. Ronalee BeltsBhandari, was reported abd increased in size overnight, pt has hyperactive BS. FYI

## 2016-12-18 NOTE — Progress Notes (Signed)
Paged Dr. Ronalee BeltsBhandari, pts daughter in room and would like and update and where to go from her as far as care.  Her cell [phone]

## 2016-12-18 NOTE — Progress Notes (Signed)
Paged Dr. Linzie CollinBhandani, pt co abd pain after eating and drinking.  Pt does not have much drainage from flexiseal.

## 2016-12-19 ENCOUNTER — Inpatient Hospital Stay (HOSPITAL_COMMUNITY): Payer: Medicare Other

## 2016-12-19 DIAGNOSIS — Z7189 Other specified counseling: Secondary | ICD-10-CM

## 2016-12-19 DIAGNOSIS — R140 Abdominal distension (gaseous): Secondary | ICD-10-CM | POA: Diagnosis present

## 2016-12-19 DIAGNOSIS — Z515 Encounter for palliative care: Secondary | ICD-10-CM

## 2016-12-19 LAB — RENAL FUNCTION PANEL
ANION GAP: 8 (ref 5–15)
Albumin: 2.5 g/dL — ABNORMAL LOW (ref 3.5–5.0)
BUN: 7 mg/dL (ref 6–20)
CHLORIDE: 109 mmol/L (ref 101–111)
CO2: 22 mmol/L (ref 22–32)
CREATININE: 0.63 mg/dL (ref 0.44–1.00)
Calcium: 8.5 mg/dL — ABNORMAL LOW (ref 8.9–10.3)
GFR calc non Af Amer: >60 mL/min (ref >60)
Glucose, Bld: 148 mg/dL — ABNORMAL HIGH (ref 65–99)
POTASSIUM: 3.3 mmol/L — AB (ref 3.5–5.1)
Phosphorus: 3.6 mg/dL (ref 2.5–4.6)
Sodium: 139 mmol/L (ref 135–145)

## 2016-12-19 LAB — CBC
HCT: 33.1 % — ABNORMAL LOW (ref 36.0–46.0)
HEMOGLOBIN: 10.5 g/dL — AB (ref 12.0–15.0)
MCH: 30.9 pg (ref 26.0–34.0)
MCHC: 31.7 g/dL (ref 30.0–36.0)
MCV: 97.4 fL (ref 78.0–100.0)
Platelets: 292 10*3/uL (ref 150–400)
RBC: 3.4 MIL/uL — ABNORMAL LOW (ref 3.87–5.11)
RDW: 14.9 % (ref 11.5–15.5)
WBC: 13.5 10*3/uL — AB (ref 4.0–10.5)

## 2016-12-19 LAB — GLUCOSE, CAPILLARY
GLUCOSE-CAPILLARY: 136 mg/dL — AB (ref 65–99)
Glucose-Capillary: 112 mg/dL — ABNORMAL HIGH (ref 65–99)
Glucose-Capillary: 116 mg/dL — ABNORMAL HIGH (ref 65–99)
Glucose-Capillary: 119 mg/dL — ABNORMAL HIGH (ref 65–99)
Glucose-Capillary: 131 mg/dL — ABNORMAL HIGH (ref 65–99)
Glucose-Capillary: 99 mg/dL (ref 65–99)

## 2016-12-19 MED ORDER — POTASSIUM CHLORIDE 20 MEQ/15ML (10%) PO SOLN
40.0000 meq | Freq: Every day | ORAL | Status: DC
  Administered 2016-12-19 – 2016-12-21 (×3): 40 meq via ORAL
  Filled 2016-12-19 (×3): qty 30

## 2016-12-19 NOTE — Progress Notes (Signed)
PROGRESS NOTE    Adrienne [name]  UVO:[id number] DOB: Jan 20, 1936 DOA: 12/08/2016 PCP: Londell MohPHARR,WALTER DAVIDSON, MD   Brief Narrative: [name] with pmhx significant for DM, HTN, OSA, and dementia who presents from her nursing home with abdominal distention and respiratory distress. Patient was found to haveileus vs SBO, high lactic acid.  She was intubated on 1/24 to 1/27. [facility].  Assessment & Plan:  # Acute respiratory failure with hypoxia: Required mechanical ventilator.  -Chest x-ray with bibasilar atelectasis. Patient was extubated and currently on oxygen by nasal cannula 2 Liters. Not on antibiotics. Try to wean off oxygen gradually. Continue supportive care.  #Small bowel obstruction versus ileus: NG tube was removed. Evaluated by general surgery.  -Repeat x-ray with improvement in the small bowel obstruction. Patient is having diarrhea and has a rectal tube. Stool C. difficile negative. Continue supportive care. Able to tolerate liquid diet only. We will advance gradually as tolerated. -Leukocytosis trending down.  # Hypertension: Continue losartan. Monitor blood pressure.  #Acute kidney injury: Improved. Monitor BMP.  #Lactic acidosis resolved  #Hypokalemia: Serum potassium level low today therefore started oral potassium chloride. Monitor BMP.  #Dementia without behavioral disturbance: Continue supportive treatment. -PT OT recommended SNF. Social worker consult for SNF discharge.   Principal Problem:   Acute respiratory failure with hypoxemia (HCC) Active Problems:   SBO (small bowel obstruction)   Acute renal failure (HCC)   Elevated lactic acid level   Metabolic acidosis   Ileus (HCC)   Ventilator dependent (HCC)   Pressure injury of skin   Oral phase dysphagia  DVT prophylaxis: Lovenox subcutaneous Code Status: Full code Family Communication: No family present at bedside Disposition Plan: Likely discharge to SNF in 1-2  days.    Consultants:   General surgery  Procedures: NG tube placement Antimicrobials: None at this time  Subjective: Patient was seen and examined at bedside. Patient was more alert awake and reported that abdomen pain is better today. Denied nausea vomiting chest pain or shortness of breath. Objective: Vitals:   12/18/16 1709 12/18/16 2103 12/19/16 0509 12/19/16 0840  BP: (!) 136/49 (!) 190/58 (!) 115/93 125/60  Pulse: 64 76 77 82  Resp: (!) 122 18 18 (!) 22  Temp: 98.5 F (36.9 C) 98.6 F (37 C) 97.8 F (36.6 C) 98 F (36.7 C)  TempSrc: Oral Oral Oral Oral  SpO2: 100% 100% 98% 100%  Weight:      Height:        Intake/Output Summary (Last 24 hours) at 12/19/16 1305 Last data filed at 12/19/16 1000  Gross per 24 hour  Intake              840 ml  Output                0 ml  Net              840 ml   Filed Weights   12/15/16 2031 12/16/16 2100 12/17/16 2236  Weight: 99.9 kg (220 lb 3.8 oz) 106.1 kg (234 lb) 98.9 kg (218 lb)    Examination:  General exam: Lying on bed comfortable, alert awake and not in distress. Respiratory system: Clear bilateral, respiratory effort normal. Cardiovascular system: S1 & S2 heard, RRR.  No pedal edema. Gastrointestinal system: Abdomen soft, nontender. Bowel sound positive. Central nervous system: Alert awake and following commands. Extremities: Symmetric 5 x 5 power. Skin: No rashes, lesions or ulcers Psychiatry: Alert awake .  Data Reviewed: I have personally reviewed following labs and imaging studies  CBC:  Recent Labs Lab 12/13/16 0523 12/15/16 0703 12/16/16 0646 12/17/16 0645 12/19/16 0435  WBC 12.0* 20.9* 19.1* 14.1* 13.5*  HGB 11.4* 13.3 11.8* 10.3* 10.5*  HCT 36.5 41.5 37.7 33.4* 33.1*  MCV 99.2 98.1 99.7 99.7 97.4  PLT 189 228 178 209 292   Basic Metabolic Panel:  Recent Labs Lab 12/13/16 0523 12/15/16 0703 12/16/16 0646 12/17/16 1042 12/19/16 0435  NA 143 139 141 142 139  K 4.5 3.6 3.7 4.1  3.3*  CL 111 107 109 116* 109  CO2 23 22 21* 18* 22  GLUCOSE 138* 165* 160* 147* 148*  BUN 12 9 14 11 7   CREATININE 0.80 0.80 0.78 0.67 0.63  CALCIUM 8.4* 8.5* 8.3* 8.4* 8.5*  PHOS  --   --  3.0 2.5 3.6   GFR: Estimated Creatinine Clearance: 66.2 mL/min (by C-G formula based on SCr of 0.63 mg/dL). Liver Function Tests:  Recent Labs Lab 12/16/16 0646 12/17/16 1042 12/19/16 0435  ALBUMIN 2.6* 2.6* 2.5*   No results for input(s): LIPASE, AMYLASE in the last 168 hours. No results for input(s): AMMONIA in the last 168 hours. Coagulation Profile: No results for input(s): INR, PROTIME in the last 168 hours. Cardiac Enzymes: No results for input(s): CKTOTAL, CKMB, CKMBINDEX, TROPONINI in the last 168 hours. BNP (last 3 results) No results for input(s): PROBNP in the last 8760 hours. HbA1C: No results for input(s): HGBA1C in the last 72 hours. CBG:  Recent Labs Lab 12/18/16 2021 12/18/16 2355 12/19/16 0507 12/19/16 0808 12/19/16 1225  GLUCAP 119* 110* 136* 131* 119*   Lipid Profile: No results for input(s): CHOL, HDL, LDLCALC, TRIG, CHOLHDL, LDLDIRECT in the last 72 hours. Thyroid Function Tests: No results for input(s): TSH, T4TOTAL, FREET4, T3FREE, THYROIDAB in the last 72 hours. Anemia Panel: No results for input(s): VITAMINB12, FOLATE, FERRITIN, TIBC, IRON, RETICCTPCT in the last 72 hours. Sepsis Labs: No results for input(s): PROCALCITON, LATICACIDVEN in the last 168 hours.  Recent Results (from the past 240 hour(s))  C difficile quick scan w PCR reflex     Status: None   Collection Time: 12/12/16  5:07 AM  Result Value Ref Range Status   C Diff antigen NEGATIVE NEGATIVE Final   C Diff toxin NEGATIVE NEGATIVE Final   C Diff interpretation No C. difficile detected.  Final         Radiology Studies: Dg Abd Portable 1v  Result Date: 12/19/2016 CLINICAL DATA:  Small bowel obstruction EXAM: PORTABLE ABDOMEN - 1 VIEW COMPARISON:  December 15, 2016 FINDINGS:  Persistent gastric distention. The small bowel loops are smaller in caliber with no ileus or obstruction identified. No free air, portal venous gas, or pneumatosis on supine imaging. Lung bases are unremarkable. IMPRESSION: 1. Persistent gastric distention. 2. The small bowel loops have returned to normal caliber. Electronically Signed   By: Gerome Sam III M.D   On: 12/19/2016 08:32        Scheduled Meds: . enoxaparin (LOVENOX) injection  40 mg Subcutaneous Q24H  . famotidine  20 mg Oral BID  . feeding supplement  1 Container Oral TID BM  . insulin aspart  0-9 Units Subcutaneous Q4H  . losartan  50 mg Oral Daily  . mouth rinse  15 mL Mouth Rinse BID  . metoprolol tartrate  12.5 mg Oral BID  . potassium chloride  40 mEq Oral Daily   Continuous Infusions:    LOS: 11  days    Kalee Mcclenathan Jaynie Collins, MD [facility] Pager [phone]  If 7PM-7AM, please contact night-coverage [email or web address] Password [email or web address] 12/19/2016, 1:05 PM

## 2016-12-19 NOTE — Progress Notes (Signed)
Notified by NT that pt rectal tube was out and upon assessment the balloon was still inflated. Pt in no distress. Pt has had previous rectal tubes and rectum was probably hyperinflated due to this. Did not re-insert as this is a contraindication to the flexiseal. Will report off to oncoming nurse.

## 2016-12-19 NOTE — Consult Note (Signed)
Consultation Note Date: 12/19/2016   Patient Name: Adrienne [name]  DOB: 11-05-1936  MRN: [id number]  Age / Sex: 81 y.o., female  PCP: Merri Brunette, MD Referring Physician: Maxie Barb, MD  Reason for Consultation: Establishing goals of care and Psychosocial/spiritual support  HPI/Patient Profile: 81 y.o. female  admitted on 12/08/2016 with pmhx significant for DM, HTN, OSA, and dementia who presents from her nursing home with abdominal distention and respiratory distress.   Patient's daughter was called by the nursing home/[facility] saying that patient's abdomen was distended and that she has one episode of emesis overnight. She was brought in to the ED for evaluation.   SBO/ resolving , ARF required intubation this admission and now extubated and stable.  Lactic acidosis resolving.   Remains high risk for decompensation.  Likely ready for discharge in 1-2 days  Family faced with advanced directive decisions and anticipatory care needs   Clinical Assessment and Goals of Care:   This NP Lorinda Creed reviewed medical records, received report from team, assessed the patient and then meet at the patient's bedside along with her daughter Adrienne [name]  to discuss diagnosis, prognosis, GOC, EOL wishes disposition and options.  I worked with [name] in the past when she had to make similar decisions for her father who since has died in Apr 22, 2012  A detailed discussion was had today regarding advanced directives.  Concepts specific to code status, artifical feeding and hydration, continued IV antibiotics and rehospitalization was had.  The difference between a aggressive medical intervention path  and a palliative comfort care path for this patient at this time was had.  Values and goals of care important to patient and family were attempted to be elicited.  MOST form revisted  Concept of Hospice and  Palliative Care were discussed  Natural trajectory and expectations at EOL were discussed.  Questions and concerns addressed.   Family encouraged to call with questions or concerns.  PMT will continue to support holistically.    HCPOA/daughter Adrienne [name]   SUMMARY OF RECOMMENDATIONS    Code Status/Advance Care Planning:  Full code encouraged family to consider DNR status considering multiple co-morbidites and quality of life   Palliative Prophylaxis:   Aspiration, Bowel Regimen, Delirium Protocol, Frequent Pain Assessment and Oral Care  Additional Recommendations (Limitations, Scope, Preferences):  Daughter is open to all offered and available medical interventions to prolong life  Psycho-social/Spiritual:   Desire for further Chaplaincy support:no  Additional Recommendations: Education on Hospice  Prognosis:   Unable to determine  Discharge Planning: Skilled Nursing Facility for rehab with Palliative care service follow-up      Primary Diagnoses: Present on Admission: . SBO (small bowel obstruction) . Acute respiratory failure with hypoxemia (HCC)   I have reviewed the medical record, interviewed the patient and family, and examined the patient. The following aspects are pertinent.  Past Medical History:  Diagnosis Date  . Anxiety   . Depression   . Diabetes mellitus    ORAL MED  . Frequency of urination   .  Headache(784.0) 10/23/2013  . Hypercholesterolemia   . Hypertension   . Hypothyroidism    STATES SHE NO LONGER NEEDS THYROID SUPPLEMENT  . Nocturia   . Normal nuclear stress test Jan 2012   No ischemia. EF 81%  . Obesity   . Osteoarthritis (arthritis due to wear and tear of joints)    PAIN AND OA BOTH KNEES  . Osteopenia   . Shortness of breath    WITH EXERTION  . Sleep apnea    DOES NOT USE CPAP-UNABLE TO TOLERATE  . Urinary frequency    Social History   Social History  . Marital status: Married    Spouse name: N/A  . Number of  children: N/A  . Years of education: N/A   Occupational History  . clerical     retired   Social History Main Topics  . Smoking status: Never Smoker  . Smokeless tobacco: Never Used  . Alcohol use No  . Drug use: No  . Sexual activity: No   Other Topics Concern  . None   Social History Narrative  . None   Family History  Problem Relation Age of Onset  . Heart disease Father   . Hypertension Father   . Heart attack Father   . Colon cancer Mother   . Heart attack Mother   . Hypertension Mother   . Breast cancer Sister    Scheduled Meds: . enoxaparin (LOVENOX) injection  40 mg Subcutaneous Q24H  . famotidine  20 mg Oral BID  . feeding supplement  1 Container Oral TID BM  . insulin aspart  0-9 Units Subcutaneous Q4H  . losartan  50 mg Oral Daily  . mouth rinse  15 mL Mouth Rinse BID  . metoprolol tartrate  12.5 mg Oral BID  . potassium chloride  40 mEq Oral Daily   Continuous Infusions: PRN Meds:.acetaminophen, LORazepam, ondansetron (ZOFRAN) IV, promethazine Medications Prior to Admission:  Prior to Admission medications   Medication Sig Start Date End Date Taking? Authorizing Provider  acetaminophen (TYLENOL) 500 MG tablet Take 500 mg by mouth every 6 (six) hours as needed for pain.   Yes Historical Provider, MD  ALPRAZolam (XANAX) 0.25 MG tablet Take 0.25 mg by mouth 2 (two) times daily.   Yes Historical Provider, MD  aspirin EC 81 MG tablet Take 81 mg by mouth daily.   Yes Historical Provider, MD  butalbital-acetaminophen-caffeine (FIORICET, ESGIC) 50-325-40 MG per tablet Take 50 tablets by mouth every 4 (four) hours as needed for headache or migraine.  10/15/13  Yes Historical Provider, MD  Calcium Carbonate (CALTRATE 600 PO) Take 1 tablet by mouth 2 (two) times daily.   Yes Historical Provider, MD  Cholecalciferol (VITAMIN D PO) Take 4,000 tablets by mouth daily.    Yes Historical Provider, MD  diclofenac sodium (VOLTAREN) 1 % GEL Apply 2 g topically 4 (four) times  daily as needed (pain).   Yes Historical Provider, MD  DULoxetine (CYMBALTA) 60 MG capsule Take 60 mg by mouth daily.   Yes Historical Provider, MD  DYMISTA 137-50 MCG/ACT SUSP Place 137 mcg into alternate nostrils 2 (two) times daily.  10/15/13  Yes Historical Provider, MD  fenofibrate (TRICOR) 48 MG tablet Take 48 mg by mouth every evening.   Yes Historical Provider, MD  furosemide (LASIX) 20 MG tablet Take 30 mg by mouth daily.   Yes Historical Provider, MD  Inf[employer] [facility] EX) Apply 1 application topically every 2 (two) hours as needed (after each changing).  Yes Historical Provider, MD  losartan (COZAAR) 50 MG tablet Take 1 tablet (50 mg total) by mouth daily. 11/05/13  Yes Jeralyn BennettEzequiel Zamora, MD  memantine (NAMENDA) 10 MG tablet Take 10 mg by mouth 2 (two) times daily.   Yes Historical Provider, MD  metFORMIN (GLUCOPHAGE) 500 MG tablet Take 500 mg by mouth 2 (two) times daily.    Yes Historical Provider, MD  Multiple Vitamins-Minerals (DECUBI-VITE) CAPS Take 1 capsule by mouth every evening.   Yes Historical Provider, MD  multivitamin-iron-minerals-folic acid (THERAPEUTIC-M) TABS tablet Take 1 tablet by mouth every evening.   Yes Historical Provider, MD  OLANZapine (ZYPREXA) 2.5 MG tablet Take 2.5 mg by mouth at bedtime.   Yes Historical Provider, MD  polyethylene glycol (MIRALAX / GLYCOLAX) packet Take 17 g by mouth daily.   Yes Historical Provider, MD  pravastatin (PRAVACHOL) 80 MG tablet Take 80 mg by mouth every morning.   Yes Historical Provider, MD  saccharomyces boulardii (FLORASTOR) 250 MG capsule Take 250 mg by mouth 2 (two) times daily.   Yes Historical Provider, MD  senna-docusate (SENOKOT-S) 8.6-50 MG tablet Take 1 tablet by mouth at bedtime.   Yes Historical Provider, MD  solifenacin (VESICARE) 10 MG tablet Take 10 mg by mouth every evening.   Yes Historical Provider, MD  traMADol (ULTRAM) 50 MG tablet Take 50-100 mg by mouth every 4 (four) hours as needed for  moderate pain or severe pain.   Yes Historical Provider, MD  triamcinolone ointment (KENALOG) 0.1 % Apply 1 application topically 2 (two) times daily as needed (infection in toe).   Yes Historical Provider, MD  triamcinolone ointment (KENALOG) 0.5 % Apply 1 application topically 2 (two) times daily.   Yes Historical Provider, MD  vitamin B-12 (CYANOCOBALAMIN) 1000 MCG tablet Take 1,000 mcg by mouth every evening.   Yes Historical Provider, MD  vitamin C (ASCORBIC ACID) 500 MG tablet Take 500 mg by mouth 2 (two) times daily.   Yes Historical Provider, MD  zolpidem (AMBIEN) 5 MG tablet Take 5 mg by mouth at bedtime.   Yes Historical Provider, MD   Allergies  Allergen Reactions  . Prednisone Shortness Of Breath  . Ibuprofen Other (See Comments)    Swelling mouth and lips  . Meclizine Other (See Comments)    HALLUCINATIONS  . Sulfonamide Derivatives     REACTION: Reaction not known   Review of Systems  Unable to perform ROS: Dementia    Physical Exam  Constitutional: She appears well-developed. She appears ill.  Cardiovascular: Normal rate, regular rhythm and normal heart sounds.   Pulmonary/Chest: She has decreased breath sounds in the right lower field and the left lower field.  Abdominal: She exhibits distension. There is tenderness.  Neurological: She is alert.  Skin: Skin is warm and dry.  Psychiatric: Cognition and memory are impaired.    Vital Signs: BP 125/60 (BP Location: Right Arm)   Pulse 82   Temp 98 F (36.7 C) (Oral)   Resp (!) 22   Ht 5\' 4"  (1.626 m)   Wt 98.9 kg (218 lb)   SpO2 100%   BMI 37.42 kg/m  Pain Assessment: 0-10 POSS *See Group Information*: S-Acceptable,Sleep, easy to arouse Pain Score: Asleep   SpO2: SpO2: 100 % O2 Device:SpO2: 100 % O2 Flow Rate: .O2 Flow Rate (L/min): 2 L/min  IO: Intake/output summary:  Intake/Output Summary (Last 24 hours) at 12/19/16 1131 Last data filed at 12/19/16 1000  Gross per 24 hour  Intake  1460 ml    Output                0 ml  Net             1460 ml    LBM: Last BM Date: 12/18/16 Baseline Weight: Weight: 102.1 kg (225 lb) Most recent weight: Weight: 98.9 kg (218 lb)     Palliative Assessment/Data: 30 % at best   Discussed with Dr Ronalee Belts  Time In: 1330 Time Out: 1445 Time Total: 75 min Greater than 50%  of this time was spent counseling and coordinating care related to the above assessment and plan.  Signed by: Lorinda Creed, NP   Please contact Palliative Medicine Team phone at [phone] for questions and concerns.  For individual provider: See Loretha Stapler

## 2016-12-19 NOTE — Progress Notes (Signed)
Patient still having loose light brown watery bowel movement 100-150 ml per hr.Alert to self ,most of the time anxious and agitated.Has been placed in recliner ,after twenty minutes,patient become agitated and shouting.Placed back in bed for comfort.

## 2016-12-19 NOTE — Progress Notes (Signed)
New flexi seal rectal tube inserted to the patient.M.D. made aware.

## 2016-12-20 LAB — GLUCOSE, CAPILLARY
GLUCOSE-CAPILLARY: 124 mg/dL — AB (ref 65–99)
GLUCOSE-CAPILLARY: 141 mg/dL — AB (ref 65–99)
Glucose-Capillary: 123 mg/dL — ABNORMAL HIGH (ref 65–99)
Glucose-Capillary: 109 mg/dL — ABNORMAL HIGH (ref 65–99)
Glucose-Capillary: 154 mg/dL — ABNORMAL HIGH (ref 65–99)

## 2016-12-20 LAB — BASIC METABOLIC PANEL
Anion gap: 6 (ref 5–15)
BUN: 7 mg/dL (ref 6–20)
CALCIUM: 8.4 mg/dL — AB (ref 8.9–10.3)
CO2: 20 mmol/L — ABNORMAL LOW (ref 22–32)
CREATININE: 0.58 mg/dL (ref 0.44–1.00)
Chloride: 112 mmol/L — ABNORMAL HIGH (ref 101–111)
GFR calc Af Amer: >60 mL/min (ref >60)
GFR calc non Af Amer: >60 mL/min (ref >60)
Glucose, Bld: 156 mg/dL — ABNORMAL HIGH (ref 65–99)
Potassium: 3.6 mmol/L (ref 3.5–5.1)
SODIUM: 138 mmol/L (ref 135–145)

## 2016-12-20 LAB — CBC
HCT: 33 % — ABNORMAL LOW (ref 36.0–46.0)
Hemoglobin: 10.4 g/dL — ABNORMAL LOW (ref 12.0–15.0)
MCH: 31 pg (ref 26.0–34.0)
MCHC: 31.5 g/dL (ref 30.0–36.0)
MCV: 98.5 fL (ref 78.0–100.0)
PLATELETS: 252 10*3/uL (ref 150–400)
RBC: 3.35 MIL/uL — ABNORMAL LOW (ref 3.87–5.11)
RDW: 15.3 % (ref 11.5–15.5)
WBC: 11.9 10*3/uL — AB (ref 4.0–10.5)

## 2016-12-20 MED ORDER — LOPERAMIDE HCL 2 MG PO CAPS
2.0000 mg | ORAL_CAPSULE | Freq: Once | ORAL | Status: AC
  Administered 2016-12-20: 2 mg via ORAL
  Filled 2016-12-20: qty 1

## 2016-12-20 MED ORDER — PRO-STAT SUGAR FREE PO LIQD
30.0000 mL | Freq: Three times a day (TID) | ORAL | Status: DC
  Administered 2016-12-20 – 2016-12-21 (×3): 30 mL via ORAL
  Filled 2016-12-20 (×6): qty 30

## 2016-12-20 NOTE — Progress Notes (Addendum)
Nutrition Follow-up  DOCUMENTATION CODES:   Obesity unspecified  INTERVENTION:  Continue Boost Breeze po TID, each supplement provides 250 kcal and 9 grams of protein.  Provide 30 ml Prostat po TID, each supplement provides 100 kcal and 15 grams of protein.   Encourage adequate PO intake.   NUTRITION DIAGNOSIS:   Inadequate oral intake related to altered GI function as evidenced by NPO status; diet advanced; improving  GOAL:   Patient will meet greater than or equal to 90% of their needs; not met  MONITOR:   PO intake, Supplement acceptance, Labs, Weight trends, Skin, I & O's  REASON FOR ASSESSMENT:   Ventilator    ASSESSMENT:   Pt with pmhx significant for DM, HTN, OSA, and dementia who presents from her nursing home with abdominal distention and respiratory distress. NGT removed 1/31.  Meal completion has been 25-50% with 25% po today. Pt reports no abdominal pains during time of visit. Pt currently has Boost Breeze ordered and has been consuming them. RD to additionally order Prostat to aid in caloric and protein needs. Labs and medications reviewed.   Diet Order:  DIET SOFT Room service appropriate? Yes; Fluid consistency: Thin  Skin:  Wound (see comment) (DTI to buttocks)  Last BM:  2/4  Height:   Ht Readings from Last 1 Encounters:  12/08/16 5' 4"  (1.626 m)    Weight:   Wt Readings from Last 1 Encounters:  12/20/16 223 lb 8.7 oz (101.4 kg)    Ideal Body Weight:  54.5 kg  BMI:  Body mass index is 38.37 kg/m.  Estimated Nutritional Needs:   Kcal:  2957-4734  Protein:  85-95 grams  Fluid:  1.7 - 1.9 L/day  EDUCATION NEEDS:   No education needs identified at this time  Corrin Parker, MS, RD, LDN Pager # [phone] After hours/ weekend pager # [phone]

## 2016-12-20 NOTE — Progress Notes (Signed)
Physical Therapy Treatment Patient Details Name: Adrienne [name] MRN: [id number] DOB: 04/23/1936 Today's Date: 12/20/2016    History of Present Illness 81 yo female admitted with distended abdomen/nausea, ileus vs SBO, vent d/c 12/11/16. Acute respiratory failure with hypoxia: Required mechanical ventilator     PT Comments    Due to several bouts of stool, treatment had to be centered around rolling, sitting EOB, standing attempts to do pericare and replace padding and transfers to chair.  Fear of falling playing a larger roll in her limited function than weakness.   Follow Up Recommendations  SNF     Equipment Recommendations  Other (comment) (TBA)    Recommendations for Other Services       Precautions / Restrictions Precautions Precautions: Fall Restrictions Weight Bearing Restrictions: No    Mobility  Bed Mobility Overal bed mobility: Needs Assistance Bed Mobility: Rolling;Sidelying to Sit Rolling: Max assist;+2 for physical assistance Sidelying to sit: Max assist;+2 for physical assistance       General bed mobility comments: multiple rolls for pericare due to loose stool  Transfers Overall transfer level: Needs assistance Equipment used: Rolling walker (2 wheeled);2 person hand held assist Transfers: Sit to/from [employer] Sit to Stand: Max assist;+2 physical assistance   Squat pivot transfers: Max assist;+2 physical assistance     General transfer comment: The more trials of standing for transfer and for pericare the more scared and resistant pt was.  Ambulation/Gait             General Gait Details: unable   Stairs            Wheelchair Mobility    Modified Rankin (Stroke Patients Only)       Balance Overall balance assessment: Needs assistance Sitting-balance support: Bilateral upper extremity supported;Feet supported Sitting balance-Leahy Scale: Poor     Standing balance support: Bilateral upper extremity  supported;During functional activity Standing balance-Leahy Scale: Zero                      Cognition Arousal/Alertness: Awake/alert Behavior During Therapy: WFL for tasks assessed/performed;Anxious Overall Cognitive Status: Impaired/Different from baseline Area of Impairment: Orientation;Memory;Awareness;Problem solving Orientation Level: Situation;Place;Time   Memory: Decreased short-term memory     Awareness: Intellectual Problem Solving: Slow processing;Requires tactile cues;Requires verbal cues      Exercises      General Comments General comments (skin integrity, edema, etc.): Still has rectal pouch, but it's not working effectively as stool is spraying out around the seal.      Pertinent Vitals/Pain Pain Assessment: Faces Faces Pain Scale: Hurts a little bit Pain Location: vague Pain Descriptors / Indicators: Grimacing Pain Intervention(s): Monitored during session    Home Living                      Prior Function            PT Goals (current goals can now be found in the care plan section) Acute Rehab PT Goals PT Goal Formulation: Patient unable to participate in goal setting Time For Goal Achievement: 12/29/16 Potential to Achieve Goals: Fair Progress towards PT goals: Progressing toward goals (very slow gains, limited by fear)    Frequency    Min 3X/week      PT Plan Current plan remains appropriate    Co-evaluation             End of Session   Activity Tolerance: Patient limited by fatigue Patient left: in  chair;with call bell/phone within reach;with chair alarm set;Other (comment) (lift pad under pt.)     Time: 1441-1520 PT Time Calculation (min) (ACUTE ONLY): 39 min  Charges:  $Therapeutic Activity: 38-52 mins                    G CodesEliseo [name]:      Adrienne [name] Adrienne [name] 12/20/2016, 5:22 PM 12/20/2016  Adrienne [name], PT [phone]  (pager)

## 2016-12-20 NOTE — Progress Notes (Signed)
PROGRESS NOTE    Adrienne [name]  VWU:[id number] DOB: September 12, 1936 DOA: 12/08/2016 PCP: Londell MohPHARR,WALTER DAVIDSON, MD   Brief Narrative: [name] with pmhx significant for DM, HTN, OSA, and dementia who presents from her nursing home with abdominal distention and respiratory distress. Patient was found to haveileus vs SBO, high lactic acid.  She was intubated on 1/24 to 1/27. [facility].  Assessment & Plan:  # Acute respiratory failure with hypoxia: Required mechanical ventilator.  -Chest x-ray with bibasilar atelectasis. Patient was extubated and currently on oxygen by nasal cannula 2 Liters. Not on antibiotics. Try to wean oxygen to room air today. Continue supportive care.  #Small bowel obstruction versus ileus: NG tube was removed. Evaluated by general surgery.  -Repeat x-ray with improvement in the small bowel obstruction. Patient is having diarrhea and has a rectal tube. Stool C. difficile negative. Abdomen pain is better. I will order a dose of Imodium because of diarrhea and plan to remove rectal tube. Continue supportive care.  -Encourage soft diet and out of bed activities. -Leukocytosis trending down.  # Hypertension: Continue losartan. Monitor blood pressure.  #Acute kidney injury: Improved. Monitor BMP.  #Lactic acidosis resolved  #Hypokalemia: Serum potassium level acceptable. Continue potassium chloride.  #Dementia without behavioral disturbance: Continue supportive treatment. -PT OT recommended SNF. Social worker consult for SNF discharge. Likely discharge tomorrow. -Palliative care service evaluated the patient. Discussed with them yesterday. Patient is currently full code. I'm planning to order outpatient referral to palliative care on discharge.  Principal Problem:   Acute on chronic respiratory failure with hypoxemia (HCC) Active Problems:   SBO (small bowel obstruction)   Acute renal failure (HCC)   Elevated lactic acid level  Metabolic acidosis   Ileus (HCC)   Ventilator dependent (HCC)   Pressure injury of skin   Oral phase dysphagia   DNR (do not resuscitate) discussion   Palliative care by specialist   Abdominal distention  DVT prophylaxis: Lovenox subcutaneous Code Status: Full code Family Communication: Discussed with the patient's daughter at length on 12/19/16 Disposition Plan: Likely discharge to SNF in 1-2 days.    Consultants:   General surgery  Procedures: NG tube placement Antimicrobials: None at this time  Subjective: Patient was seen and examined at bedside. No new event. Continue to have diarrhea. Has a rectal tube. Reported abdomen pain is better. No nausea vomiting. No chest pain or shortness of breath. Objective: Vitals:   12/19/16 2229 12/20/16 0425 12/20/16 0431 12/20/16 0900  BP: 123/65 (!) 138/34  128/60  Pulse: 79 78  77  Resp: 20 19  18   Temp: 98.5 F (36.9 C) 98.3 F (36.8 C)  98.5 F (36.9 C)  TempSrc: Oral Oral  Oral  SpO2: 100% 100%  100%  Weight: 101.4 kg (223 lb 8.7 oz)  101.4 kg (223 lb 8.7 oz)   Height:        Intake/Output Summary (Last 24 hours) at 12/20/16 1352 Last data filed at 12/20/16 1002  Gross per 24 hour  Intake             1200 ml  Output             1300 ml  Net             -100 ml   Filed Weights   12/17/16 2236 12/19/16 2229 12/20/16 0431  Weight: 98.9 kg (218 lb) 101.4 kg (223 lb 8.7 oz) 101.4 kg (223 lb 8.7 oz)  Examination:  General exam: Lying on bed comfortable, pleasant, not in distress. Respiratory system: Clear bilateral, respiratory effort normal Cardiovascular system: S1 & S2 heard, RRR.  No pedal edema. Gastrointestinal system: Abdomen soft, nontender, bowel sound positive. Has a rectal tube with very minimal stool Central nervous system: Alert awake and following commands. Extremities: Symmetric 5 x 5 power. Skin: No rashes, lesions or ulcers Psychiatry: Alert awake .     Data Reviewed: I have personally reviewed  following labs and imaging studies  CBC:  Recent Labs Lab 12/15/16 0703 12/16/16 0646 12/17/16 0645 12/19/16 0435 12/20/16 0617  WBC 20.9* 19.1* 14.1* 13.5* 11.9*  HGB 13.3 11.8* 10.3* 10.5* 10.4*  HCT 41.5 37.7 33.4* 33.1* 33.0*  MCV 98.1 99.7 99.7 97.4 98.5  PLT 228 178 209 292 252   Basic Metabolic Panel:  Recent Labs Lab 12/15/16 0703 12/16/16 0646 12/17/16 1042 12/19/16 0435 12/20/16 0617  NA 139 141 142 139 138  K 3.6 3.7 4.1 3.3* 3.6  CL 107 109 116* 109 112*  CO2 22 21* 18* 22 20*  GLUCOSE 165* 160* 147* 148* 156*  BUN 9 14 11 7 7   CREATININE 0.80 0.78 0.67 0.63 0.58  CALCIUM 8.5* 8.3* 8.4* 8.5* 8.4*  PHOS  --  3.0 2.5 3.6  --    GFR: Estimated Creatinine Clearance: 67.2 mL/min (by C-G formula based on SCr of 0.58 mg/dL). Liver Function Tests:  Recent Labs Lab 12/16/16 0646 12/17/16 1042 12/19/16 0435  ALBUMIN 2.6* 2.6* 2.5*   No results for input(s): LIPASE, AMYLASE in the last 168 hours. No results for input(s): AMMONIA in the last 168 hours. Coagulation Profile: No results for input(s): INR, PROTIME in the last 168 hours. Cardiac Enzymes: No results for input(s): CKTOTAL, CKMB, CKMBINDEX, TROPONINI in the last 168 hours. BNP (last 3 results) No results for input(s): PROBNP in the last 8760 hours. HbA1C: No results for input(s): HGBA1C in the last 72 hours. CBG:  Recent Labs Lab 12/19/16 2142 12/20/16 0002 12/20/16 0408 12/20/16 0808 12/20/16 1236  GLUCAP 112* 99 124* 141* 123*   Lipid Profile: No results for input(s): CHOL, HDL, LDLCALC, TRIG, CHOLHDL, LDLDIRECT in the last 72 hours. Thyroid Function Tests: No results for input(s): TSH, T4TOTAL, FREET4, T3FREE, THYROIDAB in the last 72 hours. Anemia Panel: No results for input(s): VITAMINB12, FOLATE, FERRITIN, TIBC, IRON, RETICCTPCT in the last 72 hours. Sepsis Labs: No results for input(s): PROCALCITON, LATICACIDVEN in the last 168 hours.  Recent Results (from the past 240  hour(s))  C difficile quick scan w PCR reflex     Status: None   Collection Time: 12/12/16  5:07 AM  Result Value Ref Range Status   C Diff antigen NEGATIVE NEGATIVE Final   C Diff toxin NEGATIVE NEGATIVE Final   C Diff interpretation No C. difficile detected.  Final         Radiology Studies: Dg Abd Portable 1v  Result Date: 12/19/2016 CLINICAL DATA:  Small bowel obstruction EXAM: PORTABLE ABDOMEN - 1 VIEW COMPARISON:  December 15, 2016 FINDINGS: Persistent gastric distention. The small bowel loops are smaller in caliber with no ileus or obstruction identified. No free air, portal venous gas, or pneumatosis on supine imaging. Lung bases are unremarkable. IMPRESSION: 1. Persistent gastric distention. 2. The small bowel loops have returned to normal caliber. Electronically Signed   By: Gerome Sam III M.D   On: 12/19/2016 08:32        Scheduled Meds: . enoxaparin (LOVENOX) injection  40 mg  Subcutaneous Q24H  . famotidine  20 mg Oral BID  . feeding supplement  1 Container Oral TID BM  . insulin aspart  0-9 Units Subcutaneous Q4H  . loperamide  2 mg Oral Once  . losartan  50 mg Oral Daily  . mouth rinse  15 mL Mouth Rinse BID  . metoprolol tartrate  12.5 mg Oral BID  . potassium chloride  40 mEq Oral Daily   Continuous Infusions:    LOS: 12 days    Slevin Gunby Jaynie Collins, MD [facility] Pager [phone]  If 7PM-7AM, please contact night-coverage [email or web address] Password [email or web address] 12/20/2016, 1:52 PM

## 2016-12-21 ENCOUNTER — Inpatient Hospital Stay (HOSPITAL_COMMUNITY): Payer: Medicare Other

## 2016-12-21 LAB — GLUCOSE, CAPILLARY
GLUCOSE-CAPILLARY: 129 mg/dL — AB (ref 65–99)
GLUCOSE-CAPILLARY: 127 mg/dL — AB (ref 65–99)
Glucose-Capillary: 132 mg/dL — ABNORMAL HIGH (ref 65–99)
Glucose-Capillary: 136 mg/dL — ABNORMAL HIGH (ref 65–99)
Glucose-Capillary: 142 mg/dL — ABNORMAL HIGH (ref 65–99)
Glucose-Capillary: 168 mg/dL — ABNORMAL HIGH (ref 65–99)

## 2016-12-21 MED ORDER — ALPRAZOLAM 0.5 MG PO TABS
0.5000 mg | ORAL_TABLET | Freq: Once | ORAL | Status: AC
Start: 2016-12-21 — End: 2016-12-21
  Administered 2016-12-21: 0.5 mg via ORAL
  Filled 2016-12-21: qty 1

## 2016-12-21 MED ORDER — PRO-STAT SUGAR FREE PO LIQD: 30.0000 mL | Freq: Three times a day (TID) | ORAL | 0 refills | Status: DC

## 2016-12-21 MED ORDER — LOPERAMIDE HCL 2 MG PO CAPS: 2.0000 mg | ORAL_CAPSULE | ORAL | 0 refills | Status: DC | PRN

## 2016-12-21 MED ORDER — ONDANSETRON HCL 8 MG PO TABS
8.0000 mg | ORAL_TABLET | Freq: Three times a day (TID) | ORAL | 0 refills | Status: DC | PRN
Start: 2016-12-21 — End: 2017-08-15

## 2016-12-21 MED ORDER — ONDANSETRON HCL 4 MG PO TABS
8.0000 mg | ORAL_TABLET | Freq: Three times a day (TID) | ORAL | Status: DC | PRN
  Administered 2016-12-21: 8 mg via ORAL
  Filled 2016-12-21: qty 2

## 2016-12-21 MED ORDER — POTASSIUM CHLORIDE 20 MEQ/15ML (10%) PO SOLN: 20.0000 meq | Freq: Every day | ORAL | 0 refills | Status: DC

## 2016-12-21 MED ORDER — ALPRAZOLAM 0.25 MG PO TABS: 0.2500 mg | ORAL_TABLET | Freq: Every evening | ORAL | 0 refills | Status: DC | PRN

## 2016-12-21 MED ORDER — BOOST / RESOURCE BREEZE PO LIQD: 1.0000 | Freq: Three times a day (TID) | ORAL | 0 refills | Status: DC

## 2016-12-21 MED ORDER — POLYETHYLENE GLYCOL 3350 17 G PO PACK: 17.0000 g | PACK | Freq: Every day | ORAL | 0 refills | Status: DC | PRN

## 2016-12-21 MED ORDER — METOPROLOL TARTRATE 25 MG PO TABS: 12.5000 mg | ORAL_TABLET | Freq: Two times a day (BID) | ORAL | Status: DC

## 2016-12-21 NOTE — Progress Notes (Signed)
Paged MD Ronalee BeltsBhandari about pt vomiting, diarrhea and stomach pain.   Gave tylenol, PO zofran and Xanax. [facility]   Will continue to monitor.   Leonia ReevesNatalie N Stewart Pimenta, RN

## 2016-12-21 NOTE — Discharge Summary (Addendum)
Physician Discharge Summary  Adrienne [name] ZOX:[id number] DOB: Apr 27, 1936 DOA: 12/08/2016  PCP: [name], MD  Admit date: 12/08/2016 Discharge date: 12/21/2016  Admitted From:SNF Disposition:SNF  Recommendations for Outpatient Follow-up:  1. Follow up with PCP in 1-2 weeks 2. Please obtain BMP/CBC in one week 3.  Home Health:SNF Equipment/Devices:oxygen Discharge Condition:stable CODE STATUS:full code Diet recommendation:Soft heart healthy diet.  Brief/Interim Summary: # Acute respiratory failure with hypoxia: Required mechanical ventilator.  -Chest x-ray with bibasilar atelectasis. Patient was extubated and currently on oxygen by nasal cannula 2 Liters.  -Clinically stable. Planning to discharge with 2 L of oxygen and plan to wean to room air gradually.  #Small bowel obstruction versus ileus: Patient was evaluated by general surgery. NG tube was placed and removed later on. Repeat abdominal x-ray with improvement in the small bowel obstruction. Later on patient developed diarrhea requiring rectal tear. Which was removed as diarrhea is improved. Patient's abdomen pain is improved and has no nausea or vomiting. She is able to tolerate soft diet well.  -Encourage oral intake.  # Hypertension: Continue losartan. Monitor blood pressure.  #Acute kidney injury: Improved. Monitor BMP.  #Lactic acidosis resolved  #Hypokalemia: Serum potassium level acceptable. Continue potassium chloride. Repeat lab as an outpatient.  #Dementia without behavioral disturbance: Continue supportive treatment. -PT OT recommended SNF. Social worker consult for SNF discharge. Discharging to SNF today. -Palliative care service evaluated the patient. Currently patient is full code. Outpatient referral to palliative care made.  Patient clinically improved. Abdominal pain and distention also improved. Diarrhea is better. Able to tolerate diet well. Discussed with the social worker and case  management team today. Patient is medically stable to transfer her care to SNF today with close outpatient follow-up.  Discharge Diagnoses:  Principal Problem:   Acute on chronic respiratory failure with hypoxemia (HCC) Active Problems:   SBO (small bowel obstruction)   Acute renal failure (HCC)   Elevated lactic acid level   Metabolic acidosis   Ileus (HCC)   Ventilator dependent (HCC)   Pressure injury of skin   Oral phase dysphagia   DNR (do not resuscitate) discussion   Palliative care by specialist   Abdominal distention    Discharge Instructions  Discharge Instructions    Amb Referral to Palliative Care    Complete by:  As directed    Call MD for:  difficulty breathing, headache or visual disturbances    Complete by:  As directed    Call MD for:  extreme fatigue    Complete by:  As directed    Call MD for:  hives    Complete by:  As directed    Call MD for:  persistant dizziness or light-headedness    Complete by:  As directed    Call MD for:  persistant nausea and vomiting    Complete by:  As directed    Call MD for:  severe uncontrolled pain    Complete by:  As directed    Call MD for:  temperature >100.4    Complete by:  As directed    Diet - low sodium heart healthy    Complete by:  As directed    Increase activity slowly    Complete by:  As directed      Allergies as of 12/21/2016      Reactions   Prednisone Shortness Of Breath   Ibuprofen Other (See Comments)   Swelling mouth and lips   Meclizine Other (See Comments)   HALLUCINATIONS  Sulfonamide Derivatives    REACTION: Reaction not known      Medication List    STOP taking these medications   furosemide 20 MG tablet Commonly known as:  LASIX   senna-docusate 8.6-50 MG tablet Commonly known as:  Senokot-S   traMADol 50 MG tablet Commonly known as:  ULTRAM     TAKE these medications   acetaminophen 500 MG tablet Commonly known as:  TYLENOL Take 500 mg by mouth every 6 (six) hours as  needed for pain.   ALPRAZolam 0.25 MG tablet Commonly known as:  XANAX Take 1 tablet (0.25 mg total) by mouth at bedtime as needed for anxiety. What changed:  when to take this  reasons to take this   aspirin EC 81 MG tablet Take 81 mg by mouth daily.   butalbital-acetaminophen-caffeine 50-325-40 MG tablet Commonly known as:  FIORICET, ESGIC Take 50 tablets by mouth every 4 (four) hours as needed for headache or migraine.   CALTRATE 600 PO Take 1 tablet by mouth 2 (two) times daily.   DERMACLOUD EX Apply 1 application topically every 2 (two) hours as needed (after each changing).   DULoxetine 60 MG capsule Commonly known as:  CYMBALTA Take 60 mg by mouth daily.   DYMISTA 137-50 MCG/ACT Susp Generic drug:  Azelastine-Fluticasone Place 137 mcg into alternate nostrils 2 (two) times daily.   feeding supplement (PRO-STAT SUGAR FREE 64) Liqd Take 30 mLs by mouth 3 (three) times daily.   feeding supplement Liqd Take 1 Container by mouth 3 (three) times daily between meals.   fenofibrate 48 MG tablet Commonly known as:  TRICOR Take 48 mg by mouth every evening.   loperamide 2 MG capsule Commonly known as:  IMODIUM Take 1 capsule (2 mg total) by mouth as needed for diarrhea or loose stools.   losartan 50 MG tablet Commonly known as:  COZAAR Take 1 tablet (50 mg total) by mouth daily.   memantine 10 MG tablet Commonly known as:  NAMENDA Take 10 mg by mouth 2 (two) times daily.   metFORMIN 500 MG tablet Commonly known as:  GLUCOPHAGE Take 500 mg by mouth 2 (two) times daily.   metoprolol tartrate 25 MG tablet Commonly known as:  LOPRESSOR Take 0.5 tablets (12.5 mg total) by mouth 2 (two) times daily.   multivitamin-iron-minerals-folic acid Tabs tablet Take 1 tablet by mouth every evening.   DECUBI-VITE Caps Take 1 capsule by mouth every evening.   OLANZapine 2.5 MG tablet Commonly known as:  ZYPREXA Take 2.5 mg by mouth at bedtime.   ondansetron 8 MG  tablet Commonly known as:  ZOFRAN Take 1 tablet (8 mg total) by mouth every 8 (eight) hours as needed for nausea or vomiting.   polyethylene glycol packet Commonly known as:  MIRALAX / GLYCOLAX Take 17 g by mouth daily as needed for mild constipation. What changed:  when to take this  reasons to take this   potassium chloride 20 MEQ/15ML (10%) Soln Take 15 mLs (20 mEq total) by mouth daily. Start taking on:  12/22/2016   pravastatin 80 MG tablet Commonly known as:  PRAVACHOL Take 80 mg by mouth every morning.   saccharomyces boulardii 250 MG capsule Commonly known as:  FLORASTOR Take 250 mg by mouth 2 (two) times daily.   solifenacin 10 MG tablet Commonly known as:  VESICARE Take 10 mg by mouth every evening.   triamcinolone ointment 0.1 % Commonly known as:  KENALOG Apply 1 application topically 2 (two) times daily  as needed (infection in toe). What changed:  Another medication with the same name was removed. Continue taking this medication, and follow the directions you see here.   vitamin B-12 1000 MCG tablet Commonly known as:  CYANOCOBALAMIN Take 1,000 mcg by mouth every evening.   vitamin C 500 MG tablet Commonly known as:  ASCORBIC ACID Take 500 mg by mouth 2 (two) times daily.   VITAMIN D PO Take 4,000 tablets by mouth daily.   VOLTAREN 1 % Gel Generic drug:  diclofenac sodium Apply 2 g topically 4 (four) times daily as needed (pain).   zolpidem 5 MG tablet Commonly known as:  AMBIEN Take 5 mg by mouth at bedtime.            Durable Medical Equipment        Start     Ordered   12/21/16 1118  DME Oxygen  Once    Comments:  Please try to wean to room air  Question Answer Comment  Mode or (Route) Nasal cannula   Liters per Minute 2   Frequency Continuous (stationary and portable oxygen unit needed)   Oxygen conserving device Yes   Oxygen delivery system Gas      12/21/16 1118     Follow-up Information    [name], MD.  Schedule an appointment as soon as possible for a visit in 1 week.   Specialty:  Internal Medicine Why:  Appointment Date: Please have family call the office to discuss a change in attending Doctor, this patient has not been seen at this office for three years. Thank you.  Contact information: [address] [place] [address] [phone]          Allergies  Allergen Reactions  . Prednisone Shortness Of Breath  . Ibuprofen Other (See Comments)    Swelling mouth and lips  . Meclizine Other (See Comments)    HALLUCINATIONS  . Sulfonamide Derivatives     REACTION: Reaction not known    Consultations: General surgery Palliative care  Procedures/Studies: NG tube placement in the hospital  Subjective: Patient was seen and examined at bedside. Patient reported feeling much better. Denied nausea vomiting or abdominal pain. No diarrhea today. No chest pain or shortness of breath.   Discharge Exam: Vitals:   12/21/16 0845 12/21/16 1813  BP: (!) 113/53 (!) 104/59  Pulse: 73 88  Resp: 18 18  Temp: 97.6 F (36.4 C) 98.4 F (36.9 C)   Vitals:   12/20/16 2017 12/21/16 0427 12/21/16 0845 12/21/16 1813  BP: (!) 138/46 (!) 129/49 (!) 113/53 (!) 104/59  Pulse: 91 74 73 88  Resp: 18 18 18 18   Temp: 98.2 F (36.8 C) 98.8 F (37.1 C) 97.6 F (36.4 C) 98.4 F (36.9 C)  TempSrc: Oral Axillary Oral Oral  SpO2: 99% 95% 97% 98%  Weight: 93.9 kg (207 lb)     Height:        General: Pt is alert, awake, not in acute distress Cardiovascular: RRR, S1/S2 +, no rubs, no gallops Respiratory: CTA bilaterally, no wheezing, no rhonchi Abdominal: Soft, NT, ND, bowel sounds + Extremities: no edema, no cyanosis Neurology: Alert, awake, following commands.   The results of significant diagnostics from this hospitalization (including imaging, microbiology, ancillary and laboratory) are listed below for reference.     Microbiology: Recent Results (from the past 240  hour(s))  C difficile quick scan w PCR reflex     Status: None   Collection Time: 12/12/16  5:07 AM  Result Value Ref Range Status   C Diff antigen NEGATIVE NEGATIVE Final   C Diff toxin NEGATIVE NEGATIVE Final   C Diff interpretation No C. difficile detected.  Final     Labs: BNP (last 3 results)  Recent Labs  12/08/16 0250  BNP 47.5   Basic Metabolic Panel:  Recent Labs Lab 12/15/16 0703 12/16/16 0646 12/17/16 1042 12/19/16 0435 12/20/16 0617  NA 139 141 142 139 138  K 3.6 3.7 4.1 3.3* 3.6  CL 107 109 116* 109 112*  CO2 22 21* 18* 22 20*  GLUCOSE 165* 160* 147* 148* 156*  BUN 9 14 11 7 7   CREATININE 0.80 0.78 0.67 0.63 0.58  CALCIUM 8.5* 8.3* 8.4* 8.5* 8.4*  PHOS  --  3.0 2.5 3.6  --    Liver Function Tests:  Recent Labs Lab 12/16/16 0646 12/17/16 1042 12/19/16 0435  ALBUMIN 2.6* 2.6* 2.5*   No results for input(s): LIPASE, AMYLASE in the last 168 hours. No results for input(s): AMMONIA in the last 168 hours. CBC:  Recent Labs Lab 12/15/16 0703 12/16/16 0646 12/17/16 0645 12/19/16 0435 12/20/16 0617  WBC 20.9* 19.1* 14.1* 13.5* 11.9*  HGB 13.3 11.8* 10.3* 10.5* 10.4*  HCT 41.5 37.7 33.4* 33.1* 33.0*  MCV 98.1 99.7 99.7 97.4 98.5  PLT 228 178 209 292 252   Cardiac Enzymes: No results for input(s): CKTOTAL, CKMB, CKMBINDEX, TROPONINI in the last 168 hours. BNP: Invalid input(s): POCBNP CBG:  Recent Labs Lab 12/21/16 0029 12/21/16 0423 12/21/16 0810 12/21/16 1136 12/21/16 1637  GLUCAP 132* 129* 136* 127* 168*   D-Dimer No results for input(s): DDIMER in the last 72 hours. Hgb A1c No results for input(s): HGBA1C in the last 72 hours. Lipid Profile No results for input(s): CHOL, HDL, LDLCALC, TRIG, CHOLHDL, LDLDIRECT in the last 72 hours. Thyroid function studies No results for input(s): TSH, T4TOTAL, T3FREE, THYROIDAB in the last 72 hours.  Invalid input(s): FREET3 Anemia work up No results for input(s): VITAMINB12, FOLATE,  FERRITIN, TIBC, IRON, RETICCTPCT in the last 72 hours. Urinalysis    Component Value Date/Time   COLORURINE AMBER (A) 12/08/2016 0549   APPEARANCEUR HAZY (A) 12/08/2016 0549   LABSPEC 1.021 12/08/2016 0549   PHURINE 5.0 12/08/2016 0549   GLUCOSEU 50 (A) 12/08/2016 0549   HGBUR NEGATIVE 12/08/2016 0549   BILIRUBINUR NEGATIVE 12/08/2016 0549   KETONESUR NEGATIVE 12/08/2016 0549   PROTEINUR NEGATIVE 12/08/2016 0549   UROBILINOGEN 0.2 11/02/2013 1645   NITRITE NEGATIVE 12/08/2016 0549   LEUKOCYTESUR NEGATIVE 12/08/2016 0549   Sepsis Labs Invalid input(s): PROCALCITONIN,  WBC,  LACTICIDVEN Microbiology Recent Results (from the past 240 hour(s))  C difficile quick scan w PCR reflex     Status: None   Collection Time: 12/12/16  5:07 AM  Result Value Ref Range Status   C Diff antigen NEGATIVE NEGATIVE Final   C Diff toxin NEGATIVE NEGATIVE Final   C Diff interpretation No C. difficile detected.  Final     Time coordinating discharge: 33 minutes  SIGNED:   Maxie Barb, MD  [facility] 12/21/2016, 6:47 PM  If 7PM-7AM, please contact night-coverage [email or web address] Password [email or web address]

## 2016-12-21 NOTE — Care Management Important Message (Signed)
Important Message  Patient Details  Name: Adrienne [name] MRN: [id number] Date of Birth: 14-Mar-1936   Medicare Important Message Given:  Yes    Witney Huie 12/21/2016, 1:14 PM

## 2016-12-21 NOTE — Progress Notes (Signed)
Patient is medically stable for discharge and [facility].  Daughter has been contacted regarding discharge and is agreeable to ambulance transport.  No other social work needs identified at this time.  Social work signing off.     Renard HamperLecretia Devlin Mcveigh, Social Work Intern Social Work

## 2016-12-21 NOTE — Progress Notes (Signed)
Flexiseal discontinued at this time.  of saline removed from balloon.  Pt tolerated procedure well.

## 2016-12-22 ENCOUNTER — Inpatient Hospital Stay (HOSPITAL_COMMUNITY): Payer: Medicare Other | Admitting: Certified Registered Nurse Anesthetist

## 2016-12-22 ENCOUNTER — Encounter (HOSPITAL_COMMUNITY)
Admission: EM | Disposition: A | Discharge status: Skilled Nursing Facility | Payer: Self-pay | Source: Home / Self Care | Attending: Nephrology

## 2016-12-22 ENCOUNTER — Encounter (HOSPITAL_COMMUNITY): Payer: Self-pay | Admitting: Certified Registered Nurse Anesthetist

## 2016-12-22 ENCOUNTER — Inpatient Hospital Stay (HOSPITAL_COMMUNITY): Payer: Medicare Other

## 2016-12-22 DIAGNOSIS — J690 Pneumonitis due to inhalation of food and vomit: Secondary | ICD-10-CM

## 2016-12-22 DIAGNOSIS — D72829 Elevated white blood cell count, unspecified: Secondary | ICD-10-CM

## 2016-12-22 DIAGNOSIS — E876 Hypokalemia: Secondary | ICD-10-CM

## 2016-12-22 DIAGNOSIS — K562 Volvulus: Secondary | ICD-10-CM

## 2016-12-22 HISTORY — PX: UMBILICAL HERNIA REPAIR: SHX196

## 2016-12-22 HISTORY — PX: LYSIS OF ADHESION: SHX5961

## 2016-12-22 HISTORY — PX: LAPAROTOMY: SHX154

## 2016-12-22 LAB — CBC WITH DIFFERENTIAL/PLATELET
BASOS ABS: 0 10*3/uL (ref 0.0–0.1)
Basophils Relative: 0 %
EOS ABS: 0.1 10*3/uL (ref 0.0–0.7)
EOS PCT: 0 %
HCT: 37.4 % (ref 36.0–46.0)
HEMOGLOBIN: 11.8 g/dL — AB (ref 12.0–15.0)
LYMPHS ABS: 1.7 10*3/uL (ref 0.7–4.0)
Lymphocytes Relative: 11 %
MCH: 31.2 pg (ref 26.0–34.0)
MCHC: 31.6 g/dL (ref 30.0–36.0)
MCV: 98.9 fL (ref 78.0–100.0)
Monocytes Absolute: 0.5 10*3/uL (ref 0.1–1.0)
Monocytes Relative: 3 %
NEUTROS PCT: 86 %
Neutro Abs: 13.6 10*3/uL — ABNORMAL HIGH (ref 1.7–7.7)
PLATELETS: 329 10*3/uL (ref 150–400)
RBC: 3.78 MIL/uL — AB (ref 3.87–5.11)
RDW: 15.8 % — ABNORMAL HIGH (ref 11.5–15.5)
WBC: 15.9 10*3/uL — AB (ref 4.0–10.5)

## 2016-12-22 LAB — GLUCOSE, CAPILLARY
GLUCOSE-CAPILLARY: 156 mg/dL — AB (ref 65–99)
GLUCOSE-CAPILLARY: 142 mg/dL — AB (ref 65–99)
GLUCOSE-CAPILLARY: 130 mg/dL — AB (ref 65–99)
GLUCOSE-CAPILLARY: 138 mg/dL — AB (ref 65–99)
Glucose-Capillary: 111 mg/dL — ABNORMAL HIGH (ref 65–99)
Glucose-Capillary: 136 mg/dL — ABNORMAL HIGH (ref 65–99)
Glucose-Capillary: 141 mg/dL — ABNORMAL HIGH (ref 65–99)

## 2016-12-22 LAB — LACTIC ACID, PLASMA
LACTIC ACID, VENOUS: 3.1 mmol/L — AB (ref 0.5–1.9)
LACTIC ACID, VENOUS: 3.3 mmol/L — AB (ref 0.5–1.9)
LACTIC ACID, VENOUS: 3.3 mmol/L — AB (ref 0.5–1.9)

## 2016-12-22 LAB — COMPREHENSIVE METABOLIC PANEL
ALK PHOS: 59 U/L (ref 38–126)
ALT: 22 U/L (ref 14–54)
ANION GAP: 10 (ref 5–15)
AST: 37 U/L (ref 15–41)
Albumin: 2.7 g/dL — ABNORMAL LOW (ref 3.5–5.0)
BUN: 9 mg/dL (ref 6–20)
CALCIUM: 8.7 mg/dL — AB (ref 8.9–10.3)
CHLORIDE: 110 mmol/L (ref 101–111)
CO2: 18 mmol/L — AB (ref 22–32)
CREATININE: 0.91 mg/dL (ref 0.44–1.00)
GFR, EST NON AFRICAN AMERICAN: 59 mL/min — AB (ref >60)
Glucose, Bld: 154 mg/dL — ABNORMAL HIGH (ref 65–99)
Potassium: 3.7 mmol/L (ref 3.5–5.1)
SODIUM: 138 mmol/L (ref 135–145)
Total Bilirubin: 0.8 mg/dL (ref 0.3–1.2)
Total Protein: 6.9 g/dL (ref 6.5–8.1)

## 2016-12-22 LAB — STREP PNEUMONIAE URINARY ANTIGEN: Strep Pneumo Urinary Antigen: NEGATIVE

## 2016-12-22 SURGERY — LAPAROTOMY, EXPLORATORY
Anesthesia: General | Site: Abdomen

## 2016-12-22 MED ORDER — SUCCINYLCHOLINE 20MG/ML (10ML) SYRINGE FOR MEDFUSION PUMP - OPTIME
INTRAMUSCULAR | Status: DC | PRN
  Administered 2016-12-22: 120 mg via INTRAVENOUS

## 2016-12-22 MED ORDER — PROPOFOL 10 MG/ML IV BOLUS
INTRAVENOUS | Status: DC | PRN
  Administered 2016-12-22: 110 mg via INTRAVENOUS

## 2016-12-22 MED ORDER — FENTANYL CITRATE (PF) 100 MCG/2ML IJ SOLN
INTRAMUSCULAR | Status: AC
  Filled 2016-12-22: qty 2

## 2016-12-22 MED ORDER — GUAIFENESIN ER 600 MG PO TB12
1200.0000 mg | ORAL_TABLET | Freq: Two times a day (BID) | ORAL | Status: DC
  Filled 2016-12-22: qty 2

## 2016-12-22 MED ORDER — FENTANYL CITRATE (PF) 100 MCG/2ML IJ SOLN
INTRAMUSCULAR | Status: DC | PRN
  Administered 2016-12-22: 100 ug via INTRAVENOUS
  Administered 2016-12-22 (×2): 50 ug via INTRAVENOUS

## 2016-12-22 MED ORDER — LOSARTAN POTASSIUM 50 MG PO TABS
50.0000 mg | ORAL_TABLET | Freq: Every day | ORAL | Status: DC
  Filled 2016-12-22: qty 1

## 2016-12-22 MED ORDER — FENTANYL CITRATE (PF) 100 MCG/2ML IJ SOLN
25.0000 ug | INTRAMUSCULAR | Status: DC | PRN
  Administered 2016-12-22: 50 ug via INTRAVENOUS

## 2016-12-22 MED ORDER — SODIUM CHLORIDE 0.9 % IV SOLN
INTRAVENOUS | Status: DC
  Administered 2016-12-22 (×2): via INTRAVENOUS

## 2016-12-22 MED ORDER — MORPHINE SULFATE (PF) 4 MG/ML IV SOLN
4.0000 mg | INTRAVENOUS | Status: DC | PRN
  Administered 2016-12-22 – 2017-01-02 (×44): 4 mg via INTRAVENOUS
  Filled 2016-12-22 (×47): qty 1

## 2016-12-22 MED ORDER — ROCURONIUM BROMIDE 100 MG/10ML IV SOLN
INTRAVENOUS | Status: DC | PRN
  Administered 2016-12-22: 20 mg via INTRAVENOUS
  Administered 2016-12-22: 30 mg via INTRAVENOUS

## 2016-12-22 MED ORDER — SUGAMMADEX SODIUM 200 MG/2ML IV SOLN
INTRAVENOUS | Status: DC | PRN
  Administered 2016-12-22: 200 mg via INTRAVENOUS

## 2016-12-22 MED ORDER — PIPERACILLIN-TAZOBACTAM 3.375 G IVPB
3.3750 g | Freq: Three times a day (TID) | INTRAVENOUS | Status: DC
Start: 2016-12-22 — End: 2016-12-28
  Administered 2016-12-22 – 2016-12-28 (×19): 3.375 g via INTRAVENOUS
  Filled 2016-12-22 (×21): qty 50

## 2016-12-22 MED ORDER — FAMOTIDINE 40 MG/5ML PO SUSR
20.0000 mg | Freq: Two times a day (BID) | ORAL | Status: DC
  Administered 2016-12-23 – 2016-12-26 (×6): 20 mg
  Filled 2016-12-22 (×12): qty 2.5

## 2016-12-22 MED ORDER — PHENYLEPHRINE HCL 10 MG/ML IJ SOLN
INTRAVENOUS | Status: DC | PRN
  Administered 2016-12-22: 50 ug/min via INTRAVENOUS

## 2016-12-22 MED ORDER — SODIUM CHLORIDE 0.9 % IV BOLUS (SEPSIS)
500.0000 mL | Freq: Once | INTRAVENOUS | Status: AC
  Administered 2016-12-22: 500 mL via INTRAVENOUS

## 2016-12-22 MED ORDER — PROMETHAZINE HCL 25 MG/ML IJ SOLN: 6.2500 mg | INTRAMUSCULAR | Status: DC | PRN

## 2016-12-22 MED ORDER — MEPERIDINE HCL 25 MG/ML IJ SOLN: 6.2500 mg | INTRAMUSCULAR | Status: DC | PRN

## 2016-12-22 MED ORDER — IPRATROPIUM-ALBUTEROL 0.5-2.5 (3) MG/3ML IN SOLN
3.0000 mL | Freq: Four times a day (QID) | RESPIRATORY_TRACT | Status: DC
  Administered 2016-12-22: 3 mL via RESPIRATORY_TRACT
  Filled 2016-12-22: qty 3

## 2016-12-22 MED ORDER — ONDANSETRON HCL 4 MG/2ML IJ SOLN
INTRAMUSCULAR | Status: AC
  Filled 2016-12-22: qty 2

## 2016-12-22 MED ORDER — METOPROLOL TARTRATE 12.5 MG HALF TABLET
12.5000 mg | ORAL_TABLET | Freq: Two times a day (BID) | ORAL | Status: DC
  Administered 2016-12-23 – 2016-12-26 (×6): 12.5 mg
  Filled 2016-12-22 (×8): qty 1

## 2016-12-22 MED ORDER — MIDAZOLAM HCL 2 MG/2ML IJ SOLN
INTRAMUSCULAR | Status: AC
  Filled 2016-12-22: qty 2

## 2016-12-22 MED ORDER — PROPOFOL 10 MG/ML IV BOLUS
INTRAVENOUS | Status: AC
  Filled 2016-12-22: qty 20

## 2016-12-22 MED ORDER — LIDOCAINE HCL (CARDIAC) 20 MG/ML IV SOLN
INTRAVENOUS | Status: DC | PRN
  Administered 2016-12-22: 20 mg via INTRAVENOUS

## 2016-12-22 MED ORDER — IPRATROPIUM-ALBUTEROL 0.5-2.5 (3) MG/3ML IN SOLN: 3.0000 mL | RESPIRATORY_TRACT | Status: DC | PRN

## 2016-12-22 MED ORDER — FENTANYL CITRATE (PF) 100 MCG/2ML IJ SOLN
INTRAMUSCULAR | Status: AC
Start: 2016-12-22 — End: 2016-12-22
  Filled 2016-12-22: qty 2

## 2016-12-22 MED ORDER — SODIUM CHLORIDE 0.9 % IV BOLUS (SEPSIS)
1000.0000 mL | Freq: Once | INTRAVENOUS | Status: AC
  Administered 2016-12-22: 1000 mL via INTRAVENOUS

## 2016-12-22 MED ORDER — EPHEDRINE SULFATE 50 MG/ML IJ SOLN
INTRAMUSCULAR | Status: DC | PRN
  Administered 2016-12-22: 10 mg via INTRAVENOUS

## 2016-12-22 MED ORDER — PHENYLEPHRINE HCL 10 MG/ML IJ SOLN
INTRAMUSCULAR | Status: DC | PRN
  Administered 2016-12-22: 80 ug via INTRAVENOUS

## 2016-12-22 MED ORDER — POTASSIUM CHLORIDE 20 MEQ/15ML (10%) PO SOLN
40.0000 meq | Freq: Every day | ORAL | Status: DC
  Administered 2016-12-22 – 2016-12-27 (×5): 40 meq
  Filled 2016-12-22 (×5): qty 30

## 2016-12-22 MED ORDER — MIDAZOLAM HCL 2 MG/2ML IJ SOLN
0.5000 mg | Freq: Once | INTRAMUSCULAR | Status: AC | PRN
  Administered 2016-12-22: 0.5 mg via INTRAVENOUS

## 2016-12-22 MED ORDER — DEXAMETHASONE SODIUM PHOSPHATE 10 MG/ML IJ SOLN
INTRAMUSCULAR | Status: AC
  Filled 2016-12-22: qty 2

## 2016-12-22 MED ORDER — ACETAMINOPHEN 160 MG/5ML PO SOLN
650.0000 mg | Freq: Four times a day (QID) | ORAL | Status: DC | PRN
  Filled 2016-12-22: qty 20.3

## 2016-12-22 MED ORDER — LACTATED RINGERS IV SOLN
INTRAVENOUS | Status: DC | PRN
  Administered 2016-12-22: 14:00:00 via INTRAVENOUS

## 2016-12-22 MED ORDER — 0.9 % SODIUM CHLORIDE (POUR BTL) OPTIME
TOPICAL | Status: DC | PRN
  Administered 2016-12-22: 1000 mL

## 2016-12-22 SURGICAL SUPPLY — 60 items
BINDER ABDOMINAL 12 ML 46-62 (SOFTGOODS) ×4 IMPLANT
BLADE SURG ROTATE 9660 (MISCELLANEOUS) IMPLANT
CANISTER SUCTION 2500CC (MISCELLANEOUS) ×4 IMPLANT
CHLORAPREP W/TINT 26ML (MISCELLANEOUS) ×4 IMPLANT
COVER SURGICAL LIGHT HANDLE (MISCELLANEOUS) ×4 IMPLANT
DERMABOND ADVANCED (GAUZE/BANDAGES/DRESSINGS) ×2
DERMABOND ADVANCED .7 DNX12 (GAUZE/BANDAGES/DRESSINGS) ×2 IMPLANT
DRAIN CHANNEL 19F RND (DRAIN) ×4 IMPLANT
DRAPE LAPAROSCOPIC ABDOMINAL (DRAPES) ×4 IMPLANT
DRAPE LAPAROTOMY 100X72 PEDS (DRAPES) ×4 IMPLANT
DRAPE UTILITY XL STRL (DRAPES) ×4 IMPLANT
DRAPE WARM FLUID 44X44 (DRAPE) ×4 IMPLANT
DRSG OPSITE POSTOP 4X10 (GAUZE/BANDAGES/DRESSINGS) ×4 IMPLANT
DRSG OPSITE POSTOP 4X8 (GAUZE/BANDAGES/DRESSINGS) IMPLANT
ELECT BLADE 6.5 EXT (BLADE) IMPLANT
ELECT CAUTERY BLADE 6.4 (BLADE) ×4 IMPLANT
ELECT REM PT RETURN 9FT ADLT (ELECTROSURGICAL) ×4
ELECTRODE REM PT RTRN 9FT ADLT (ELECTROSURGICAL) ×2 IMPLANT
EVACUATOR SILICONE 100CC (DRAIN) ×4 IMPLANT
GAUZE SPONGE 4X4 12PLY STRL (GAUZE/BANDAGES/DRESSINGS) ×4 IMPLANT
GLOVE BIO SURGEON STRL SZ8 (GLOVE) ×4 IMPLANT
GLOVE BIOGEL PI IND STRL 8 (GLOVE) ×2 IMPLANT
GLOVE BIOGEL PI INDICATOR 8 (GLOVE) ×2
GOWN STRL REUS W/ TWL LRG LVL3 (GOWN DISPOSABLE) ×2 IMPLANT
GOWN STRL REUS W/ TWL XL LVL3 (GOWN DISPOSABLE) ×2 IMPLANT
GOWN STRL REUS W/TWL LRG LVL3 (GOWN DISPOSABLE) ×2
GOWN STRL REUS W/TWL XL LVL3 (GOWN DISPOSABLE) ×2
KIT BASIN OR (CUSTOM PROCEDURE TRAY) ×4 IMPLANT
KIT ROOM TURNOVER OR (KITS) ×4 IMPLANT
LIGASURE IMPACT 36 18CM CVD LR (INSTRUMENTS) ×4 IMPLANT
NEEDLE HYPO 25GX1X1/2 BEV (NEEDLE) ×4 IMPLANT
NS IRRIG 1000ML POUR BTL (IV SOLUTION) ×8 IMPLANT
PACK GENERAL/GYN (CUSTOM PROCEDURE TRAY) ×4 IMPLANT
PACK SURGICAL SETUP 50X90 (CUSTOM PROCEDURE TRAY) ×4 IMPLANT
PAD ARMBOARD 7.5X6 YLW CONV (MISCELLANEOUS) ×4 IMPLANT
PENCIL BUTTON HOLSTER BLD 10FT (ELECTRODE) ×4 IMPLANT
SPECIMEN JAR LARGE (MISCELLANEOUS) IMPLANT
SPONGE LAP 18X18 X RAY DECT (DISPOSABLE) ×8 IMPLANT
STAPLER VISISTAT 35W (STAPLE) ×8 IMPLANT
SUCTION POOLE TIP (SUCTIONS) ×4 IMPLANT
SUT ETHILON 2 0 FS 18 (SUTURE) ×4 IMPLANT
SUT MON AB 4-0 PC3 18 (SUTURE) IMPLANT
SUT NOVA 1 T20/GS 25DT (SUTURE) ×8 IMPLANT
SUT NOVA NAB DX-16 0-1 5-0 T12 (SUTURE) IMPLANT
SUT PDS AB 1 CT  36 (SUTURE) ×4
SUT PDS AB 1 CT 36 (SUTURE) ×4 IMPLANT
SUT PDS AB 1 TP1 96 (SUTURE) ×8 IMPLANT
SUT VIC AB 2-0 SH 18 (SUTURE) ×4 IMPLANT
SUT VIC AB 3-0 SH 18 (SUTURE) ×4 IMPLANT
SUT VIC AB 3-0 SH 27 (SUTURE)
SUT VIC AB 3-0 SH 27X BRD (SUTURE) IMPLANT
SUT VICRYL AB 2 0 TIES (SUTURE) ×4 IMPLANT
SUT VICRYL AB 3 0 TIES (SUTURE) ×4 IMPLANT
SYR CONTROL 10ML LL (SYRINGE) IMPLANT
TOWEL OR 17X24 6PK STRL BLUE (TOWEL DISPOSABLE) ×4 IMPLANT
TOWEL OR 17X26 10 PK STRL BLUE (TOWEL DISPOSABLE) IMPLANT
TRAY FOLEY CATH 16FRSI W/METER (SET/KITS/TRAYS/PACK) IMPLANT
TUBE CONNECTING 12'X1/4 (SUCTIONS)
TUBE CONNECTING 12X1/4 (SUCTIONS) IMPLANT
YANKAUER SUCT BULB TIP NO VENT (SUCTIONS) IMPLANT

## 2016-12-22 NOTE — Anesthesia Procedure Notes (Addendum)
Procedure Name: Intubation Date/Time: 12/22/2016 1:47 PM Performed by: Oletta Lamas Pre-anesthesia Checklist: Patient identified, Emergency Drugs available, Suction available and Patient being monitored Patient Re-evaluated:Patient Re-evaluated prior to inductionOxygen Delivery Method: Circle System Utilized Preoxygenation: Pre-oxygenation with 100% oxygen Intubation Type: IV induction, Rapid sequence and Cricoid Pressure applied Laryngoscope Size: Mac and 3 Grade View: Grade I Tube type: Oral Tube size: 7.0 mm Number of attempts: 1 Airway Equipment and Method: Stylet Placement Confirmation: ETT inserted through vocal cords under direct vision,  positive ETCO2 and breath sounds checked- equal and bilateral Secured at: 22 cm Tube secured with: Tape Dental Injury: Teeth and Oropharynx as per pre-operative assessment  Comments: NG tube suctioned prior to induction. 20cc brown liquid. Opened to air during induction.

## 2016-12-22 NOTE — Op Note (Signed)
Preoperative diagnosis: Incarcerated possible strangulated ventral hernia with small bowel obstruction  Postoperative diagnosis: Same  Procedure: Exploratory laparotomy with repair primary a ventral hernia  Surgeon: Harriette Bouillonhomas Jesselle Laflamme M.D.  Anesthesia: Gen.  EBL: 40 mL  Specimens: None  Drains: 19 round drain to subcutaneous space  Indications for procedure: The patient is a 81 year old frail female was seen last week for partial small bowel obstruction that resolved. She was being treated for pneumonia was scheduled for discharge later today. Last night she developed acute onset of nausea, vomiting abdominal pain. NG tube was placed a CT scan was done earlier today which showed findings worrisome for strangulation of small intestine in ventral hernia and portal venous gas. She was seen was acutely ill with the change from her baseline. She has multiple medical problems and discusses with her daughter. She is a high operative risk but concern was getting her declining state surgery would be the only option for. Risks, benefits and alternatives were discussed with the patient's daughter since the patient was incoherent. The risk of hernia repair include bleeding,  Infection,   Recurrence of the hernia,  Mesh use, chronic pain,  Organ injury,  Bowel injury,  Bladder injury,   nerve injury with numbness around the incision,  Death,  and worsening of preexisting  medical problems.  The alternatives to surgery have been discussed as well..  Long term expectations of both operative and non operative treatments have been discussed.   The patient agrees to proceed.   Description of procedure: The patient was taken from the holding area to the operating room. There she was intubated and placed upon the OR table. Her abdomen is an prepped and draped in a sterile fashion and Foley catheter was placed. After sterile prep and drape the abdomen timeout was done. She received preoperative Zosyn which she was put on  by the primary service. Midline incision was used around the hernia defect her umbilicus. Which a large hernia sac that contained omentum. There is significant ascites in the abdominal cavity. Omental adhesions were taken down from the edge of the hernia defect. She is significant distention of her small bowel. She did use inner pelvis and this was taken down sharply least a small intestine. Once I was able to run the small intestine from the cecum to the ligament of Treitz. It appeared she had obstruction of the distal small bowel causing her issues. Irrigation was used. She is significant small bowel edema but had pulses present the mesentery. The ascending colon, transverse colon, descending colon, sigmoid colon and rectum all normal. Liver and stomach were normal. Irrigation was used. I mobilized the skin and subcutaneous tissues off the anterior wall the fascia. I was able to close the fascia using #1 significant a PDS and interrupted #1 Novafil suture. A drain was placed subcutaneous tissue. The umbilicus was secured to the fascia. Incision was closed with staples. Dry dressings applied and abdominal binder. All final counts are found to be correct. The patient was awoke taken to recovery in stable condition.

## 2016-12-22 NOTE — Progress Notes (Addendum)
CRITICAL VALUE STICKER  CRITICAL VALUE: Lactic Acid 3.3   RECEIVER (on-site recipient of call):  DATE & TIME NOTIFIED: [id number] 12/22/16  MESSENGER (representative from lab):  MD NOTIFIED: MD Janee Mornhompson   TIME OF NOTIFICATION:0726 12/22/16  RESPONSE: Awaiting orders

## 2016-12-22 NOTE — Transfer of Care (Signed)
Immediate Anesthesia Transfer of Care Note  Patient: Adrienne [name]  Procedure(s) Performed: Procedure(s): EXPLORATORY LAPAROTOMY (N/A) HERNIA REPAIR UMBILICAL ADULT (N/A) LYSIS OF ADHESION (N/A)  Patient Location: PACU  Anesthesia Type:General  Level of Consciousness: awake, alert  and patient cooperative  Airway & Oxygen Therapy: Patient Spontanous Breathing and Patient connected to nasal cannula oxygen  Post-op Assessment: Report given to RN and Post -op Vital signs reviewed and stable  Post vital signs: Reviewed and stable  Last Vitals:  Vitals:   12/22/16 0445 12/22/16 0926  BP: (!) 108/43 (!) 97/38  Pulse: 91 87  Resp: 18 18  Temp: 36.4 C 36.9 C    Last Pain:  Vitals:   12/22/16 0926  TempSrc: Axillary  PainSc:       Patients Stated Pain Goal: 0 (12/18/16 2207)  Complications: No apparent anesthesia complications

## 2016-12-22 NOTE — Progress Notes (Signed)
[facility] Progress Note     Subjective: Patient was supposed to discharge yesterday to SNF however she developed emesis and abdominal pain. KUB showed marked gaseous dilatation of the stomach increased compared to prior. CT this morning showed embolic or hernia containing bowel in addition to fluid and omental adipose tissue, portal venous gas in both this hernia and intra-abdominal portal veins in the liver concerning for possible bowel necrosis. new hepatic ascites. Lactic acid 3.3. The leukocytosis 15.9. We were reconsulted it for further evaluation.  Patient complaining of constant abdominal pain worse in the area of her hernia and in her right upper quadrant. NG tube in place. Patient denies fever or chills.  Objective: Vital signs in last 24 hours: Temp:  [97.6 F (36.4 C)-98.5 F (36.9 C)] 98.4 F (36.9 C) (02/07 0926) Pulse Rate:  [65-91] 87 (02/07 0926) Resp:  [18-20] 18 (02/07 0926) BP: (97-127)/(38-85) 97/38 (02/07 0926) SpO2:  [94 %-100 %] 96 % (02/07 0926) Weight:  [208 lb (94.3 kg)] 208 lb (94.3 kg) (02/06 2028) Last BM Date: 12/21/16  Intake/Output from previous day: 02/06 0701 - 02/07 0700 In: 360 [P.O.:360] Out: 403 [Emesis/NG output:400; Stool:3] Intake/Output this shift: No intake/output data recorded.  PE: Gen:  Alert, NAD, pleasant, cooperative, uncomfortable appearing elderly woman lying in bed Card:  RRR, no M/G/R heard, 2 + radial pulses bilaterally Pulm:  CTA anteriorly, no W/R/R, rate and effort normal Abd: Obese, Soft, normalBS, large umbilical hernia that is easily reducible with moderate TTP noted and also in the RUQ and epigastric region.  Skin: not diaphoretic, warm and dry  Lab Results:   Recent Labs  12/20/16 0617 12/22/16 0141  WBC 11.9* 15.9*  HGB 10.4* 11.8*  HCT 33.0* 37.4  PLT 252 329   BMET  Recent Labs  12/20/16 0617 12/22/16 0141  NA 138 138  K 3.6 3.7  CL 112* 110  CO2 20* 18*  GLUCOSE 156* 154*  BUN 7  9  CREATININE 0.58 0.91  CALCIUM 8.4* 8.7*   PT/INR No results for input(s): LABPROT, INR in the last 72 hours. CMP     Component Value Date/Time   NA 138 12/22/2016 0141   K 3.7 12/22/2016 0141   CL 110 12/22/2016 0141   CO2 18 (L) 12/22/2016 0141   GLUCOSE 154 (H) 12/22/2016 0141   BUN 9 12/22/2016 0141   CREATININE 0.91 12/22/2016 0141   CALCIUM 8.7 (L) 12/22/2016 0141   PROT 6.9 12/22/2016 0141   ALBUMIN 2.7 (L) 12/22/2016 0141   AST 37 12/22/2016 0141   ALT 22 12/22/2016 0141   ALKPHOS 59 12/22/2016 0141   BILITOT 0.8 12/22/2016 0141   GFRNONAA 59 (L) 12/22/2016 0141   GFRAA >60 12/22/2016 0141   Lipase     Component Value Date/Time   LIPASE 61 (H) 12/08/2016 0922       Studies/Results: Ct Abdomen Pelvis Wo Contrast  Addendum Date: 12/22/2016   ADDENDUM REPORT: 12/22/2016 09:38 ADDENDUM: The original report was by Dr. Gaylyn Rong. The following addendum is by Dr. Gaylyn Rong: Critical Value/emergent results were called by telephone at the time of interpretation on 12/22/2016 at 9:35 am to Dr. Isla Pence, who verbally acknowledged these results. Electronically Signed   By: Gaylyn Rong M.D.   On: 12/22/2016 09:38   Result Date: 12/22/2016 CLINICAL DATA:  Nausea and vomiting.  Leukocytosis and diarrhea. EXAM: CT ABDOMEN AND PELVIS WITHOUT CONTRAST TECHNIQUE: Multidetector CT imaging of the abdomen and pelvis was performed following  the standard protocol without IV contrast. COMPARISON:  12/08/2016 CT scan FINDINGS: Lower chest: Mildly progressive airspace opacity medially at the left lung base with some adjacent nodularity, likely a combination of atelectasis and pneumonia. Mild cardiomegaly with mitral valve calcifications and coronary artery atherosclerotic calcifications. Hepatobiliary: Gas tracks within the liver. Although by itself this would seem indeterminate for portal venous gas versus pneumobilia, there is clearly some portal venous gas in the  abdomen on image 46/2, and accordingly the gas in the liver is felt to be portal venous gas as well. Large dense calcification posteriorly in the right hepatic lobe is stable. Cholelithiasis is noted without gas in the gallbladder. Pancreas: Unremarkable Spleen: Unremarkable Adrenals/Urinary Tract: Unremarkable Stomach/Bowel: Infraumbilical ventral hernia is now shown to contain a loop of bowel in addition to fluid in adipose tissue, with scattered punctate gas density which is extraluminal. Much of this is probably within portal venous branches contained in the hernia, but some could represent free air is well. Numerous diverticula throughout the colon especially in the ascending, transverse, did an descending colon as well as the proximal sigmoid. Scattered air- fluid levels both in the colon and in small bowel. A nasogastric tube terminates in the stomach body. There are air-fluid levels in the rectum. Vascular/Lymphatic: Portal venous gas centrally in the abdomen for example on image 46 of series 2, and also in the small vessels herniated through the umbilical hernia. Aortoiliac atherosclerotic vascular disease. Reproductive: Uterus absent.  Adnexa unremarkable. Other: New perihepatic ascites. Mild mesenteric edema. Trace gas anterior to the left hepatic lobe on image 10/2, questionably within a portal venous structure versus free air. I favor this being in venous structures. Musculoskeletal: New 4.0 by 2.7 cm mass in the subcutaneous tissues of the right lower anterior abdominal wall on image 70/2, probably a hematoma. Several smaller similar lesions on the left side. Stable 50% superior endplate compression fracture of L2, likely subacute given the sclerosis, with a notable Schmorl's node. Endplate sclerosis eccentric to the right along the inferior part of L1. 30% chronic superior endplate compression fracture at L5. There appears to be a partially calcified very large central disc protrusion at the L2- 3  level causing prominent central narrowing of the thecal sac. Foraminal impingement at L1-2, L2- 3, L3-4, and L4-5. Mild levoconvex lumbar scoliosis. Notable degenerative arthropathy of both hips. IMPRESSION: 1. The umbilical hernia now contains bowel in addition to fluid and omental adipose tissue, and there is portal venous gas in both this hernia and hand intra-abdominal portal veins and in the liver, concerning for possible bowel necrosis particularly involving the herniated segment. There is also new perihepatic ascites. Urgent surgical consultation recommended. 2. Mildly progressive airspace opacity medially at the left lung base likely combination of atelectasis and pneumonia. The 3. Scattered air-fluid levels in small and large bowel, possibly secondary ileus. Numerous colonic diverticula. Nasogastric tube in the stomach. 4. 4.0 by 2.7 cm subcutaneous hematoma along the right lower anterior abdominal wall. 5. Other imaging findings of potential clinical significance: Mild cardiomegaly. Coronary atherosclerosis. Cholelithiasis. Aortoiliac atherosclerotic vascular disease. Mild mesenteric edema. Late subacute 50% superior endplate compression fracture of L2, stable. 30% chronic superior endplate compression fracture at L5. Calcified posterior central disc protrusion at L2- 3 causing prominent central narrowing of the thecal sac. Lumbar spondylosis and degenerative disc disease along with scoliosis causing foraminal impingement at L1- 2, L2- 3, L3-4, and L4-5. Notable degenerative arthropathy of both hips. Radiology assistant personnel have been notified to put me in  telephone contact with the referring physician or the referring physician's clinical representative in order to discuss these findings, given their potentially emergent nature. Personnel have been paged and I am awaiting the callback. Once this communication is established I will issue an addendum to this report for documentation purposes.  Electronically Signed: By: Gaylyn Rong M.D. On: 12/22/2016 09:28   Dg Abd 1 View  Result Date: 12/21/2016 CLINICAL DATA:  Nausea vomiting and diarrhea EXAM: ABDOMEN - 1 VIEW COMPARISON:  12/19/2000 FINDINGS: Marked gaseous dilatation of the stomach. Remainder of the gas pattern appears nonobstructed. Multiple calcified pelvic phleboliths. Calcification in the right upper quadrant. IMPRESSION: 1. Marked gaseous dilatation of the stomach, increased compared to prior, outlet obstruction cannot be excluded. Remainder of the gas pattern appears nonobstructed. Electronically Signed   By: Jasmine Pang M.D.   On: 12/21/2016 22:48   Dg Abd Portable 1v  Result Date: 12/22/2016 CLINICAL DATA:  NG tube placement. EXAM: PORTABLE ABDOMEN - 1 VIEW COMPARISON:  12/21/2016 FINDINGS: Enteric tube tip is in the left upper quadrant consistent with location in the upper stomach. Proximal side hole is localized just distal to the expected location of the EG junction. The stomach as decompressed since the previous study. IMPRESSION: Enteric tube tip is in the left upper quadrant consistent with location in the upper stomach. Interval decompression of the stomach. Electronically Signed   By: Burman Nieves M.D.   On: 12/22/2016 01:56    Anti-infectives: Anti-infectives    Start     Dose/Rate Route Frequency Ordered Stop   12/22/16 1100  piperacillin-tazobactam (ZOSYN) IVPB 3.375 g     3.375 g 12.5 mL/hr over 240 Minutes Intravenous Every 8 hours 12/22/16 1026     12/08/16 1200  piperacillin-tazobactam (ZOSYN) IVPB 3.375 g  Status:  Discontinued     3.375 g 12.5 mL/hr over 240 Minutes Intravenous Every 8 hours 12/08/16 0717 12/11/16 0846   12/08/16 0345  vancomycin (VANCOCIN) IVPB 1000 mg/200 mL premix     1,000 mg 200 mL/hr over 60 Minutes Intravenous  Once 12/08/16 0333 12/08/16 0740   12/08/16 0345  piperacillin-tazobactam (ZOSYN) IVPB 3.375 g     3.375 g 100 mL/hr over 30 Minutes Intravenous  Once  12/08/16 1610 12/08/16 0741       Assessment/Plan  Principal Problem:   Strangulation of intestine (HCC) Active Problems:   SBO (small bowel obstruction)   Acute on chronic respiratory failure with hypoxemia (HCC)   Acute renal failure (HCC)   Elevated lactic acid level   Metabolic acidosis   Ileus (HCC)   Ventilator dependent (HCC)   Pressure injury of skin   Oral phase dysphagia   DNR (do not resuscitate) discussion   Palliative care by specialist   Abdominal distention   Leukocytosis   Aspiration pneumonia of left lung (HCC)  probable aspiration pneumonia versus healthcare associated pneumonia HTN leukocytosis VDRF acute kidney injury - Improved. hypokalemia dementia without behavioral disturbance - above begin managed by primary  strangulation of intestine - CT abdomen and pelvis, 12/22/2016, showed embolic or hernia containing bowel in addition to fluid and omental adipose tissue, there is portal venous gas in both this hernia and intra-abdominal portal veins in the liver concerning for possible bowel necrosis particularly involving the herniated segment. New peri-hepatic ascites.  - Lactic acid 3.3  - WBC 15.9.   - NPO, NGT - IV Zosyn  Pt will go to the OR today for exploratory laparotomy, possible small bowel resection, hernia repair  LOS: 14 days    Jerre SimonJessica L Taylar Hartsough , [facility] 12/22/2016, 10:26 AM Pager: [phone] Consults: [phone] Mon-Fri 7:00 am-4:30 pm Sat-Sun 7:00 am-11:30 am

## 2016-12-22 NOTE — Progress Notes (Signed)
Pharmacy Antibiotic Note  Adrienne [name] is a 81 y.o. female admitted on 12/08/2016 with intra-abdominal infection.  Pharmacy has been consulted for Zosyn dosing.  Plan: Zosyn 3.375g IV q8h (4 hour infusion).  Height: 5\' 4"  (162.6 cm) Weight: 208 lb (94.3 kg) IBW/kg (Calculated) : 54.7  Temp (24hrs), Avg:98.2 F (36.8 C), Min:97.6 F (36.4 C), Max:98.5 F (36.9 C)   Recent Labs Lab 12/16/16 0646 12/17/16 0645 12/17/16 1042 12/19/16 0435 12/20/16 0617 12/22/16 0141 12/22/16 0623 12/22/16 0806  WBC 19.1* 14.1*  --  13.5* 11.9* 15.9*  --   --   CREATININE 0.78  --  0.67 0.63 0.58 0.91  --   --   LATICACIDVEN  --   --   --   --   --  3.1* 3.3* 3.3*    Estimated Creatinine Clearance: 56.7 mL/min (by C-G formula based on SCr of 0.91 mg/dL).    Allergies  Allergen Reactions  . Prednisone Shortness Of Breath  . Ibuprofen Other (See Comments)    Swelling mouth and lips  . Meclizine Other (See Comments)    HALLUCINATIONS  . Sulfonamide Derivatives     REACTION: Reaction not known    Antimicrobials this admission: 1/24 Zosyn >> 1/27; 2/7>> 1/24 Vanc x1 1  Microbiology results: 1/24 BCx x2: neg 1/24 UCx:  neg 1/24 MRSA PCR: neg  Thank you for allowing pharmacy to be a part of this patient's care.  Sanari Offner D. Michelene Keniston, PharmD, BCPS Clinical Pharmacist Pager: [phone] 12/22/2016 10:26 AM

## 2016-12-22 NOTE — Progress Notes (Signed)
Donnamarie PoagK. Kirby notified of pt's large emesis, abdominal distention, hypoactive bowel sounds, on-going diarrhea and hx of SBO vs ileus.  Abdominal x ray ordered.

## 2016-12-22 NOTE — Clinical Social Work Note (Signed)
[facility], but developed some medical issues and the discharge was cancelled. [facility] admissions director Wille CelesteJanie updated this morning and was told that she will be kept informed regarding patient's readiness for discharge. Per Wille CelesteJanie, [facility]. CSW will continue to monitor patient's progress and facilitate discharge back to skilled facility when medically stable.  Genelle BalVanessa Crecencio Kwiatek, MSW, LCSW Licensed Clinical Social Worker Clinical Social Work Department [facility] [phone]

## 2016-12-22 NOTE — Progress Notes (Signed)
Called daughter to inform her of pt's surgery. Not given verbal consent due to questions. Paged Dr. Luisa Hartornett to call daughter, Marylene Landngela.    Leonia ReevesNatalie N Cecilie Heidel, RN

## 2016-12-22 NOTE — Progress Notes (Signed)
CRITICAL VALUE ALERT  Critical value received:  Lactic Acid = 3.1  Date of notification:  12/22/16  Time of notification:  0245  Critical value read back: YES  Nurse who received alert: Bess HarvestKandance Jyra Lagares, RN  MD notified (1st page):  Donnamarie PoagK. Kirby   Time of first page:  0248  Responding MD:  Donnamarie PoagK. Kirby  Time MD responded:  0300

## 2016-12-22 NOTE — Progress Notes (Addendum)
Shift event: RN paged earlier in shift because pt had a large amount of emesis. Pt continues to have diarrhea (nurse said she had it last shift) and hypoactive bowel sounds. No c/o abdominal pain, per se, just "distention".  Discharge held for now and KUB obtained. KUB with marked dilatation of stomach with ? gastric outlet obstruction, worsened since 12/19/16 KUB. No other obstruction noted. NP touched base with general surgery as they have followed pt for the SBO vs ileus since admission. NGT was clamped and pulled and pt placed on a diet in recent past days. Dr. Derrell Lollingamirez agreed with plan to decompress pt with NGT, bowel rest, and if better, clamp in am and try liquids again. KJKG, NP [facility] Update: LA 3.1, bolus given. Continue to cycle LA. CBC with elevated WBCC compared to last draw. Will r/p CT abd/pelvis without contrast given new findings tonight. Last CT 12/08/16. KJKG, NP [facility]

## 2016-12-22 NOTE — Progress Notes (Signed)
PT Cancellation Note  Patient Details Name: Susa Simmondslly M Sartwell MRN: [id number] DOB: June 08, 1936   Cancelled Treatment:    Reason Eval/Treat Not Completed: Medical issues which prohibited therapy;Patient at procedure or test/unavailable.  Pt has gone to surgery for Exploratory Lap for possible SBO. 12/22/2016  [place] BingKen Mariamawit Depaoli, PT [phone]  (pager)   Eliseo GumKenneth V Vaeda Westall 12/22/2016, 1:07 PM

## 2016-12-22 NOTE — Progress Notes (Signed)
Marylene Landngela, daughter, updated on progress throughout the night

## 2016-12-22 NOTE — Anesthesia Preprocedure Evaluation (Signed)
Anesthesia Evaluation  Patient identified by MRN, date of birth, ID band Patient awake and Patient confused    Reviewed: Allergy & Precautions, NPO status , Patient's Chart, lab work & pertinent test results, reviewed documented beta blocker date and time , Unable to perform ROS - Chart review only  History of Anesthesia Complications Negative for: history of anesthetic complications  Airway Mallampati: II  TM Distance: >3 FB Neck ROM: Full    Dental  (+) Caps, Dental Advisory Given, Missing   Pulmonary sleep apnea ,    breath sounds clear to auscultation       Cardiovascular hypertension, Pt. on medications and Pt. on home beta blockers  Rhythm:Regular Rate:Normal  '14 ECHO: EF 55-60%, valves OK   Neuro/Psych  Headaches, Anxiety Depression dementia   GI/Hepatic Neg liver ROS, Incarcerated hernia: NG in place   Endo/Other  diabetes ([id number]), Oral Hypoglycemic AgentsMorbid obesity  Renal/GU negative Renal ROS     Musculoskeletal   Abdominal (+) + obese,   Peds  Hematology negative hematology ROS (+)   Anesthesia Other Findings   Reproductive/Obstetrics                             Anesthesia Physical Anesthesia Plan  ASA: III  Anesthesia Plan: General   Post-op Pain Management:    Induction: Intravenous and Rapid sequence  Airway Management Planned: Oral ETT  Additional Equipment:   Intra-op Plan:   Post-operative Plan: Possible Post-op intubation/ventilation  Informed Consent: I have reviewed the patients History and Physical, chart, labs and discussed the procedure including the risks, benefits and alternatives for the proposed anesthesia with the patient or authorized representative who has indicated his/her understanding and acceptance.   Consent reviewed with POA  Plan Discussed with: CRNA and Surgeon  Anesthesia Plan Comments: (Plan routine monitors, GETA, possible post  op ventilation)        Anesthesia Quick Evaluation

## 2016-12-22 NOTE — Progress Notes (Signed)
Paged MD Janee Mornhompson to notify of rhonchi in the upper and lower lobes bilateral.  \ Leonia ReevesNatalie N Shantel Helwig, RN

## 2016-12-22 NOTE — Progress Notes (Signed)
PROGRESS NOTE    Adrienne [name]  BJY:[id number] DOB: 10-28-1936 DOA: 12/08/2016 PCP: [name], MD   Brief Narrative:  [name] with pmhx significant for DM, HTN, OSA, and dementia who presented from her nursing home with abdominal distention and respiratory distress. Patient was found to have ileus vs SBO, high lactic acid. She was intubated on 1/24 to 1/27. Transferred to [facility] from [facility] on 12/13/2016. Patient improved clinically and repeat x-ray showed improvement with small bowel obstruction. Patient also noted to have diarrhea and had rectal tube placed. C. difficile PCR was negative. Abdominal pain had improved. Patient was placed on a diet which she was tolerating. Patient was to be discharged to the skilled nursing facility on 12/21/2016 however developed abdominal pain with significant emesis and discharge was canceled. KUB done was concerning for gastric outlet obstruction. CT abdomen and pelvis done 12/22/2016 was concerning for bowel strangulation and possible atelectasis and pneumonia. Discharge canceled. Patient kept nothing by mouth NG tube in place. General surgery consulted for further evaluation. Patient placed empirically on IV Zosyn.    Assessment & Plan:   Principal Problem:   Strangulation of intestine (HCC) Active Problems:   SBO (small bowel obstruction)   Acute on chronic respiratory failure with hypoxemia (HCC)   Acute renal failure (HCC)   Elevated lactic acid level   Metabolic acidosis   Ileus (HCC)   Ventilator dependent (HCC)   Pressure injury of skin   Oral phase dysphagia   DNR (do not resuscitate) discussion   Palliative care by specialist   Abdominal distention   Leukocytosis   Aspiration pneumonia of left lung (HCC)  #1 strangulation of intestine Patient was to be discharged to skilled nursing facility on 12/21/2016, however while awaiting transportation patient did have significant emesis with abdominal pain and a such discharge was  canceled. NG tube was placed. KUB obtained on 12/21/2016 showed market gaseous dilatation of the stomach, increased compared to prior, outlet obstruction cannot be excluded. Patient was placed on bowel rest CT abdomen and pelvis was subsequently obtained on the morning of 12/22/2016 which showed embolic or hernia containing bowel in addition to fluid and omental adipose tissue, there is portal venous gas in both this hernia and intra-abdominal portal veins in the liver concerning for possible bowel necrosis particularly involving the herniated segment. New peri-hepatic ascites. Lactic acid level is elevated at 3.3. Patient with a leukocytosis with a white count of 15.9. Patient kept nothing by mouth. NG tube in place. General surgery has been re-consulted for further evaluation and management. Place on IV Zosyn. Follow.  #2 probable aspiration pneumonia versus healthcare associated pneumonia CT abdomen and pelvis with mild progressive airspace opacity medially in the left lung likely combination of atelectasis and pneumonia. Patient with significant emesis just prior to discharge and concern for possible early aspiration pneumonia. Check blood cultures 2. Check a sputum Gram stain and culture. Check a urine Legionella antigen. Check a urine pneumococcus antigen. Placed empirically on IV Zosyn. Follow.  #3 HTN Patient with borderline blood pressure with systolic blood pressure in the 90s. Patient also noted with elevated lactic acid. Concern for bowel strangulation per CT abdomen and pelvis. Hold patient's beta blocker and ARB. 1 L IV fluid bolus. Place on IV fluids. Follow.  #4 leukocytosis Likely secondary to problem #1 and 2. Check a sputum Gram stain and culture. Check a urine Legionella antigen. Check a urine pneumococcus antigen. Check blood cultures 2. Placed empirically on IV Zosyn. Follow.  #  5 VDRF Early on in the hospitalization patient had to be intubated from 124 2 12/11/2016. Patient  extubated around doing fine on nasal cannula. Follow.  #6 acute kidney injury Improved.  #7 hypokalemia Repleted.  #8 dementia without behavioral disturbance Stable. Patient when medically stable will discharge to skilled nursing facility. Patient will need palliative care to follow-up in the outpatient setting.    DVT prophylaxis: SCDs Code Status: Full Family Communication: Updated patient. Updated daughter via telephone. Disposition Plan: Remain in-house. Likely to skilled nursing facility Pending evaluation by general surgery for bowel strangulation. Per general surgery.   Consultants:   General surgery: Dr.Tsuei 12/08/2016  Wound care  Palliative care Lorinda Creed, NP 12/19/2016  Procedures:   CT abdomen and pelvis 12/08/2006, 12/22/2016  Abdominal films 12/08/2016, 12/09/2016, 12/10/2016, 12/11/2016, 12/15/2016, 12/19/2016, 12/21/2016, 12/22/2016,  Chest x-ray 12/12/2016, 12/10/2016, 12/09/2016, 12/08/2016  Antimicrobials:  IV Zosyn 12/22/2016     Subjective: Patient laying in bed with NG tube to suction. Patient with complaints of abdominal pain. No further emesis. No shortness of breath. No chest pain.  Objective: Vitals:   12/21/16 1813 12/21/16 2028 12/22/16 0445 12/22/16 0926  BP: (!) 104/59 127/85 (!) 108/43 (!) 97/38  Pulse: 88 65 91 87  Resp: 18 20 18 18   Temp: 98.4 F (36.9 C) 98.5 F (36.9 C) 97.6 F (36.4 C) 98.4 F (36.9 C)  TempSrc: Oral  Axillary Axillary  SpO2: 98% 94% 100% 96%  Weight:  94.3 kg (208 lb)    Height:        Intake/Output Summary (Last 24 hours) at 12/22/16 1017 Last data filed at 12/22/16 0700  Gross per 24 hour  Intake              120 ml  Output              403 ml  Net             -283 ml   Filed Weights   12/20/16 0431 12/20/16 2017 12/21/16 2028  Weight: 101.4 kg (223 lb 8.7 oz) 93.9 kg (207 lb) 94.3 kg (208 lb)    Examination:  General exam: Scared. NGT in place.  Respiratory system: Clear to  auscultation Anterior lung fields. Respiratory effort normal. Cardiovascular system: S1 & S2 heard, RRR. No JVD, murmurs, rubs, gallops or clicks. No pedal edema. Gastrointestinal system: Abdomen is soft and tender to palpation in the upper abdominal region, hypoactive bowel sounds.  Central nervous system: Alert and oriented. No focal neurological deficits. Extremities: Symmetric 5 x 5 power. Skin: No rashes, lesions or ulcers Psychiatry: Judgement and insight appear normal. Mood & affect appropriate.     Data Reviewed: I have personally reviewed following labs and imaging studies  CBC:  Recent Labs Lab 12/16/16 0646 12/17/16 0645 12/19/16 0435 12/20/16 0617 12/22/16 0141  WBC 19.1* 14.1* 13.5* 11.9* 15.9*  NEUTROABS  --   --   --   --  13.6*  HGB 11.8* 10.3* 10.5* 10.4* 11.8*  HCT 37.7 33.4* 33.1* 33.0* 37.4  MCV 99.7 99.7 97.4 98.5 98.9  PLT 178 209 292 252 329   Basic Metabolic Panel:  Recent Labs Lab 12/16/16 0646 12/17/16 1042 12/19/16 0435 12/20/16 0617 12/22/16 0141  NA 141 142 139 138 138  K 3.7 4.1 3.3* 3.6 3.7  CL 109 116* 109 112* 110  CO2 21* 18* 22 20* 18*  GLUCOSE 160* 147* 148* 156* 154*  BUN 14 11 7 7 9   CREATININE 0.78 0.67  0.63 0.58 0.91  CALCIUM 8.3* 8.4* 8.5* 8.4* 8.7*  PHOS 3.0 2.5 3.6  --   --    GFR: Estimated Creatinine Clearance: 56.7 mL/min (by C-G formula based on SCr of 0.91 mg/dL). Liver Function Tests:  Recent Labs Lab 12/16/16 0646 12/17/16 1042 12/19/16 0435 12/22/16 0141  AST  --   --   --  37  ALT  --   --   --  22  ALKPHOS  --   --   --  59  BILITOT  --   --   --  0.8  PROT  --   --   --  6.9  ALBUMIN 2.6* 2.6* 2.5* 2.7*   No results for input(s): LIPASE, AMYLASE in the last 168 hours. No results for input(s): AMMONIA in the last 168 hours. Coagulation Profile: No results for input(s): INR, PROTIME in the last 168 hours. Cardiac Enzymes: No results for input(s): CKTOTAL, CKMB, CKMBINDEX, TROPONINI in the last 168  hours. BNP (last 3 results) No results for input(s): PROBNP in the last 8760 hours. HbA1C: No results for input(s): HGBA1C in the last 72 hours. CBG:  Recent Labs Lab 12/21/16 1637 12/21/16 2025 12/21/16 2319 12/22/16 0441 12/22/16 0924  GLUCAP 168* 142* 156* 142* 130*   Lipid Profile: No results for input(s): CHOL, HDL, LDLCALC, TRIG, CHOLHDL, LDLDIRECT in the last 72 hours. Thyroid Function Tests: No results for input(s): TSH, T4TOTAL, FREET4, T3FREE, THYROIDAB in the last 72 hours. Anemia Panel: No results for input(s): VITAMINB12, FOLATE, FERRITIN, TIBC, IRON, RETICCTPCT in the last 72 hours. Sepsis Labs:  Recent Labs Lab 12/22/16 0141 12/22/16 0623 12/22/16 0806  LATICACIDVEN 3.1* 3.3* 3.3*    No results found for this or any previous visit (from the past 240 hour(s)).       Radiology Studies: Ct Abdomen Pelvis Wo Contrast  Addendum Date: 12/22/2016   ADDENDUM REPORT: 12/22/2016 09:38 ADDENDUM: The original report was by Dr. Gaylyn Rong. The following addendum is by Dr. Gaylyn Rong: Critical Value/emergent results were called by telephone at the time of interpretation on 12/22/2016 at 9:35 am to Dr. Isla Pence, who verbally acknowledged these results. Electronically Signed   By: Gaylyn Rong M.D.   On: 12/22/2016 09:38   Result Date: 12/22/2016 CLINICAL DATA:  Nausea and vomiting.  Leukocytosis and diarrhea. EXAM: CT ABDOMEN AND PELVIS WITHOUT CONTRAST TECHNIQUE: Multidetector CT imaging of the abdomen and pelvis was performed following the standard protocol without IV contrast. COMPARISON:  12/08/2016 CT scan FINDINGS: Lower chest: Mildly progressive airspace opacity medially at the left lung base with some adjacent nodularity, likely a combination of atelectasis and pneumonia. Mild cardiomegaly with mitral valve calcifications and coronary artery atherosclerotic calcifications. Hepatobiliary: Gas tracks within the liver. Although by itself this would  seem indeterminate for portal venous gas versus pneumobilia, there is clearly some portal venous gas in the abdomen on image 46/2, and accordingly the gas in the liver is felt to be portal venous gas as well. Large dense calcification posteriorly in the right hepatic lobe is stable. Cholelithiasis is noted without gas in the gallbladder. Pancreas: Unremarkable Spleen: Unremarkable Adrenals/Urinary Tract: Unremarkable Stomach/Bowel: Infraumbilical ventral hernia is now shown to contain a loop of bowel in addition to fluid in adipose tissue, with scattered punctate gas density which is extraluminal. Much of this is probably within portal venous branches contained in the hernia, but some could represent free air is well. Numerous diverticula throughout the colon especially in the ascending, transverse, did  an descending colon as well as the proximal sigmoid. Scattered air- fluid levels both in the colon and in small bowel. A nasogastric tube terminates in the stomach body. There are air-fluid levels in the rectum. Vascular/Lymphatic: Portal venous gas centrally in the abdomen for example on image 46 of series 2, and also in the small vessels herniated through the umbilical hernia. Aortoiliac atherosclerotic vascular disease. Reproductive: Uterus absent.  Adnexa unremarkable. Other: New perihepatic ascites. Mild mesenteric edema. Trace gas anterior to the left hepatic lobe on image 10/2, questionably within a portal venous structure versus free air. I favor this being in venous structures. Musculoskeletal: New 4.0 by 2.7 cm mass in the subcutaneous tissues of the right lower anterior abdominal wall on image 70/2, probably a hematoma. Several smaller similar lesions on the left side. Stable 50% superior endplate compression fracture of L2, likely subacute given the sclerosis, with a notable Schmorl's node. Endplate sclerosis eccentric to the right along the inferior part of L1. 30% chronic superior endplate compression  fracture at L5. There appears to be a partially calcified very large central disc protrusion at the L2- 3 level causing prominent central narrowing of the thecal sac. Foraminal impingement at L1-2, L2- 3, L3-4, and L4-5. Mild levoconvex lumbar scoliosis. Notable degenerative arthropathy of both hips. IMPRESSION: 1. The umbilical hernia now contains bowel in addition to fluid and omental adipose tissue, and there is portal venous gas in both this hernia and hand intra-abdominal portal veins and in the liver, concerning for possible bowel necrosis particularly involving the herniated segment. There is also new perihepatic ascites. Urgent surgical consultation recommended. 2. Mildly progressive airspace opacity medially at the left lung base likely combination of atelectasis and pneumonia. The 3. Scattered air-fluid levels in small and large bowel, possibly secondary ileus. Numerous colonic diverticula. Nasogastric tube in the stomach. 4. 4.0 by 2.7 cm subcutaneous hematoma along the right lower anterior abdominal wall. 5. Other imaging findings of potential clinical significance: Mild cardiomegaly. Coronary atherosclerosis. Cholelithiasis. Aortoiliac atherosclerotic vascular disease. Mild mesenteric edema. Late subacute 50% superior endplate compression fracture of L2, stable. 30% chronic superior endplate compression fracture at L5. Calcified posterior central disc protrusion at L2- 3 causing prominent central narrowing of the thecal sac. Lumbar spondylosis and degenerative disc disease along with scoliosis causing foraminal impingement at L1- 2, L2- 3, L3-4, and L4-5. Notable degenerative arthropathy of both hips. Radiology assistant personnel have been notified to put me in telephone contact with the referring physician or the referring physician's clinical representative in order to discuss these findings, given their potentially emergent nature. Personnel have been paged and I am awaiting the callback. Once this  communication is established I will issue an addendum to this report for documentation purposes. Electronically Signed: By: Gaylyn Rong M.D. On: 12/22/2016 09:28   Dg Abd 1 View  Result Date: 12/21/2016 CLINICAL DATA:  Nausea vomiting and diarrhea EXAM: ABDOMEN - 1 VIEW COMPARISON:  12/19/2000 FINDINGS: Marked gaseous dilatation of the stomach. Remainder of the gas pattern appears nonobstructed. Multiple calcified pelvic phleboliths. Calcification in the right upper quadrant. IMPRESSION: 1. Marked gaseous dilatation of the stomach, increased compared to prior, outlet obstruction cannot be excluded. Remainder of the gas pattern appears nonobstructed. Electronically Signed   By: Jasmine Pang M.D.   On: 12/21/2016 22:48   Dg Abd Portable 1v  Result Date: 12/22/2016 CLINICAL DATA:  NG tube placement. EXAM: PORTABLE ABDOMEN - 1 VIEW COMPARISON:  12/21/2016 FINDINGS: Enteric tube tip is in the left upper  quadrant consistent with location in the upper stomach. Proximal side hole is localized just distal to the expected location of the EG junction. The stomach as decompressed since the previous study. IMPRESSION: Enteric tube tip is in the left upper quadrant consistent with location in the upper stomach. Interval decompression of the stomach. Electronically Signed   By: Burman NievesWilliam  Stevens M.D.   On: 12/22/2016 01:56        Scheduled Meds: . enoxaparin (LOVENOX) injection  40 mg Subcutaneous Q24H  . famotidine  20 mg Per Tube BID  . feeding supplement  1 Container Oral TID BM  . feeding supplement (PRO-STAT SUGAR FREE 64)  30 mL Oral TID  . insulin aspart  0-9 Units Subcutaneous Q4H  . losartan  50 mg Per Tube Daily  . mouth rinse  15 mL Mouth Rinse BID  . metoprolol tartrate  12.5 mg Per Tube BID  . potassium chloride  40 mEq Per Tube Daily   Continuous Infusions: . sodium chloride       LOS: 14 days    Time spent: 40 mins    Jonathin Heinicke, MD [facility] Pager [phone]  [phone]  If 7PM-7AM, please contact night-coverage [email or web address] Password [email or web address] 12/22/2016, 10:17 AM

## 2016-12-22 NOTE — Progress Notes (Signed)
Pt arrived via stretcher from PACU. NG tube still in place left nare, JP drain see flowsheet, and new urinary foley present upon arrival.  Assessed abdominal wound- dressing intact and minimal sangiouneous drainage  Pt is alert to self at baseline.   Reports pain addressed see MAR.   Will continue to monitor.   Leonia ReevesNatalie N Tristy Udovich, RN

## 2016-12-22 NOTE — Progress Notes (Signed)
Adrienne [name] notified that pt had sx today for SBO currently hooked to LIS.  Requested to hold night time medications at this time.

## 2016-12-22 NOTE — Progress Notes (Signed)
Notified MD of BP 97/38. MD Janee Mornhompson gave order to hold Lopressor medication and administer Potassium Chloride oral solution per tube.   Will continue to monitor  Leonia ReevesNatalie N Caressa Scearce, RN

## 2016-12-22 NOTE — Progress Notes (Signed)
Pt sent down for CT of abdomen and pelvis.  Ticket to Ride provided.

## 2016-12-22 NOTE — Progress Notes (Signed)
Donnamarie PoagK. Kirby notified that pt was to be D/C'd today and that we are currently waiting on [facility] to come pick pt up.  Asked if she wanted to cancel discharge.  Discharge cancelled.     I called [facility] to cancel pick up.  I was informed by two different employees that they did not have her listed as a transfer to [place].  Later on, [facility] arrived to pick up pt.  Informed them that the discharge had been cancelled.

## 2016-12-23 ENCOUNTER — Encounter (HOSPITAL_COMMUNITY): Payer: Self-pay | Admitting: Surgery

## 2016-12-23 ENCOUNTER — Inpatient Hospital Stay (HOSPITAL_COMMUNITY): Payer: Medicare Other

## 2016-12-23 DIAGNOSIS — J9621 Acute and chronic respiratory failure with hypoxia: Secondary | ICD-10-CM

## 2016-12-23 LAB — COMPREHENSIVE METABOLIC PANEL
ALT: 17 U/L (ref 14–54)
ANION GAP: 12 (ref 5–15)
AST: 22 U/L (ref 15–41)
Albumin: 2.3 g/dL — ABNORMAL LOW (ref 3.5–5.0)
Alkaline Phosphatase: 41 U/L (ref 38–126)
BUN: 21 mg/dL — ABNORMAL HIGH (ref 6–20)
CALCIUM: 7.9 mg/dL — AB (ref 8.9–10.3)
CHLORIDE: 114 mmol/L — AB (ref 101–111)
CO2: 18 mmol/L — ABNORMAL LOW (ref 22–32)
CREATININE: 1.61 mg/dL — AB (ref 0.44–1.00)
GFR, EST AFRICAN AMERICAN: 34 mL/min — AB (ref >60)
GFR, EST NON AFRICAN AMERICAN: 30 mL/min — AB (ref >60)
Glucose, Bld: 147 mg/dL — ABNORMAL HIGH (ref 65–99)
Potassium: 4.2 mmol/L (ref 3.5–5.1)
Sodium: 144 mmol/L (ref 135–145)
Total Bilirubin: 1.1 mg/dL (ref 0.3–1.2)
Total Protein: 5.9 g/dL — ABNORMAL LOW (ref 6.5–8.1)

## 2016-12-23 LAB — CBC WITH DIFFERENTIAL/PLATELET
BASOS ABS: 0 10*3/uL (ref 0.0–0.1)
Basophils Relative: 0 %
EOS ABS: 0.1 10*3/uL (ref 0.0–0.7)
Eosinophils Relative: 2 %
HCT: 30.2 % — ABNORMAL LOW (ref 36.0–46.0)
Hemoglobin: 9.4 g/dL — ABNORMAL LOW (ref 12.0–15.0)
LYMPHS ABS: 2 10*3/uL (ref 0.7–4.0)
Lymphocytes Relative: 27 %
MCH: 31.4 pg (ref 26.0–34.0)
MCHC: 31.1 g/dL (ref 30.0–36.0)
MCV: 101 fL — ABNORMAL HIGH (ref 78.0–100.0)
MONO ABS: 0.7 10*3/uL (ref 0.1–1.0)
Monocytes Relative: 10 %
NEUTROS ABS: 4.6 10*3/uL (ref 1.7–7.7)
Neutrophils Relative %: 61 %
PLATELETS: 262 10*3/uL (ref 150–400)
RBC: 2.99 MIL/uL — AB (ref 3.87–5.11)
RDW: 17 % — AB (ref 11.5–15.5)
WBC: 7.4 10*3/uL (ref 4.0–10.5)

## 2016-12-23 LAB — URINALYSIS, ROUTINE W REFLEX MICROSCOPIC
Bilirubin Urine: NEGATIVE
Glucose, UA: NEGATIVE mg/dL
Ketones, ur: NEGATIVE mg/dL
Leukocytes, UA: NEGATIVE
Nitrite: NEGATIVE
PROTEIN: 30 mg/dL — AB
SPECIFIC GRAVITY, URINE: 1.023 (ref 1.005–1.030)
pH: 5 (ref 5.0–8.0)

## 2016-12-23 LAB — HIV ANTIBODY (ROUTINE TESTING W REFLEX): HIV SCREEN 4TH GENERATION: NONREACTIVE

## 2016-12-23 LAB — GLUCOSE, CAPILLARY
GLUCOSE-CAPILLARY: 151 mg/dL — AB (ref 65–99)
GLUCOSE-CAPILLARY: 121 mg/dL — AB (ref 65–99)
GLUCOSE-CAPILLARY: 139 mg/dL — AB (ref 65–99)
GLUCOSE-CAPILLARY: 141 mg/dL — AB (ref 65–99)
GLUCOSE-CAPILLARY: 134 mg/dL — AB (ref 65–99)
Glucose-Capillary: 127 mg/dL — ABNORMAL HIGH (ref 65–99)

## 2016-12-23 LAB — MAGNESIUM: MAGNESIUM: 1.2 mg/dL — AB (ref 1.7–2.4)

## 2016-12-23 LAB — FOLATE: FOLATE: 27 ng/mL (ref >5.9)

## 2016-12-23 LAB — IRON AND TIBC
IRON: 9 ug/dL — AB (ref 28–170)
Saturation Ratios: 4 % — ABNORMAL LOW (ref 10.4–31.8)
TIBC: 209 ug/dL — ABNORMAL LOW (ref 250–450)
UIBC: 200 ug/dL

## 2016-12-23 LAB — LACTIC ACID, PLASMA: LACTIC ACID, VENOUS: 1.8 mmol/L (ref 0.5–1.9)

## 2016-12-23 LAB — FERRITIN: FERRITIN: 212 ng/mL (ref 11–307)

## 2016-12-23 LAB — VITAMIN B12: VITAMIN B 12: 1323 pg/mL — AB (ref 180–914)

## 2016-12-23 MED ORDER — MAGNESIUM SULFATE 4 GM/100ML IV SOLN
4.0000 g | Freq: Once | INTRAVENOUS | Status: AC
  Administered 2016-12-23: 4 g via INTRAVENOUS
  Filled 2016-12-23: qty 100

## 2016-12-23 MED ORDER — IPRATROPIUM-ALBUTEROL 0.5-2.5 (3) MG/3ML IN SOLN
3.0000 mL | Freq: Three times a day (TID) | RESPIRATORY_TRACT | Status: DC
  Administered 2016-12-23 (×3): 3 mL via RESPIRATORY_TRACT
  Filled 2016-12-23 (×5): qty 3

## 2016-12-23 MED ORDER — LACTATED RINGERS IV BOLUS (SEPSIS)
500.0000 mL | Freq: Once | INTRAVENOUS | Status: AC
  Administered 2016-12-23: 500 mL via INTRAVENOUS

## 2016-12-23 NOTE — Plan of Care (Signed)
Problem: Education: Goal: Knowledge of [facility] Education information/materials will improve Outcome: Not Progressing Pt is confused and oriented only to self.  Unable to provide effective education.

## 2016-12-23 NOTE — Progress Notes (Signed)
Physical Therapy Treatment Patient Details Name: Adrienne [name] MRN: [id number] DOB: 08-Aug-1936 Today's Date: 12/23/2016    History of Present Illness 81 yo female admitted with distended abdomen/nausea, ileus vs SBO, vent d/c 12/11/16. Acute respiratory failure with hypoxia: Required mechanical ventilator  Exploratory laparotomy with repair primary a ventral hernia 2/7.    PT Comments    Pt limited in her ability to assist today due to abdominal pain, fear and lethargy.   Follow Up Recommendations  SNF     Equipment Recommendations  Other (comment)    Recommendations for Other Services       Precautions / Restrictions Precautions Precautions: Fall Precaution Comments: NG tube/abdominal binder. JP drain Required Braces or Orthoses: Other Brace/Splint (abdominal binder)    Mobility  Bed Mobility Overal bed mobility: Needs Assistance Bed Mobility: Supine to Sit Rolling: Total assist;+2 for physical assistance         General bed mobility comments: bed pad used to move pt to EOB  Transfers     Transfers: Stand Pivot Transfers   Stand pivot transfers: Total assist;+2 physical assistance       General transfer comment: Attempted to use stedy to assist wtih transfers. Pt unablet o shift weight anteriorly and appeared to be pushing posteriorly. Pt was total lifted to chair. Staff will need to use maximove  Ambulation/Gait                 Stairs            Wheelchair Mobility    Modified Rankin (Stroke Patients Only)       Balance Overall balance assessment: Needs assistance Sitting-balance support: Bilateral upper extremity supported;Feet supported Sitting balance-Leahy Scale: Poor Sitting balance - Comments: pt with posterior lean and R lat lean at times     Standing balance-Leahy Scale: Zero                      Cognition Arousal/Alertness: Awake/alert Behavior During Therapy: Anxious Overall Cognitive Status:  Impaired/Different from baseline Area of Impairment: Orientation;Memory;Awareness;Problem solving Orientation Level: Place;Time;Situation   Memory: Decreased short-term memory     Awareness: Intellectual Problem Solving: Slow processing;Decreased initiation;Difficulty sequencing;Requires verbal cues;Requires tactile cues General Comments: Baseline dementia. Slow processing and problem solving. Poor initiation    Exercises      General Comments        Pertinent Vitals/Pain Pain Assessment: Faces Faces Pain Scale: Hurts whole lot Pain Location: abdomen Pain Descriptors / Indicators: Discomfort;Grimacing;Moaning Pain Intervention(s): Limited activity within patient's tolerance    Home Living                      Prior Function            PT Goals (current goals can now be found in the care plan section) Acute Rehab PT Goals Patient Stated Goal: none stated PT Goal Formulation: Patient unable to participate in goal setting Time For Goal Achievement: 12/29/16 Potential to Achieve Goals: Fair Progress towards PT goals: Progressing toward goals    Frequency    Min 3X/week      PT Plan Current plan remains appropriate    Co-evaluation PT/OT/SLP Co-Evaluation/Treatment: Yes Reason for Co-Treatment: Complexity of the patient's impairments (multi-system involvement) PT goals addressed during session: Mobility/safety with mobility OT goals addressed during session: ADL's and self-care     End of Session   Activity Tolerance: Patient limited by pain;Patient limited by fatigue;Patient limited by lethargy Patient left: in  chair;with call bell/phone within reach;with chair alarm set     Time: 1419-1500 PT Time Calculation (min) (ACUTE ONLY): 41 min  Charges:  $Therapeutic Activity: 8-22 mins                    G CodesEliseo Gum:      Mavryk Pino V Itzamara Casas 12/23/2016, 5:49 PM 12/23/2016  Octa BingKen Syanna Remmert, PT [phone]  (pager)

## 2016-12-23 NOTE — Progress Notes (Signed)
RT entered the room to give the Pt a breathing Tx. Pt wasn't able to take a deep breath . RT worked with the Pt with the IS and she wasn't able to do the IS. Pt has rhonchi.

## 2016-12-23 NOTE — Progress Notes (Signed)
Assessment Principal Problem:  Incarcerated ventral hernia with SBO s/p expl lap and primary repair (Dr. Luisa Hart) 12/22/16-mild hypovolemia   Plan:  Fluid bolus.  OOB to chair.  Wait for bowel function to return.   LOS: 15 days     1 Day Post-Op  Subjective: Pain control adequate.  No flatus or BM.  Objective: Vital signs in last 24 hours: Temp:  [97.7 F (36.5 C)-98.2 F (36.8 C)] 98.2 F (36.8 C) (02/08 0925) Pulse Rate:  [73-104] 101 (02/08 0925) Resp:  [18-27] 18 (02/08 0925) BP: (108-135)/(50-84) 130/50 (02/08 0925) SpO2:  [95 %-99 %] 97 % (02/08 0925) Weight:  [99.8 kg (220 lb)] 99.8 kg (220 lb) (02/07 2120) Last BM Date: 12/21/16  Intake/Output from previous day: 02/07 0701 - 02/08 0700 In: 2701.3 [I.V.:2571.3; NG/GT:30; IV Piggyback:100] Out: 1330 [Urine:255; Emesis/NG output:900; Drains:75; Blood:100] Intake/Output this shift: No intake/output data recorded.  PE: General- In NAD Abdomen-slightly firm and distended, no bowel sounds, wound clean and intact, serous drain output  Lab Results:   Recent Labs  12/22/16 0141 12/23/16 0613  WBC 15.9* 7.4  HGB 11.8* 9.4*  HCT 37.4 30.2*  PLT 329 262   BMET  Recent Labs  12/22/16 0141 12/23/16 0751  NA 138 144  K 3.7 4.2  CL 110 114*  CO2 18* 18*  GLUCOSE 154* 147*  BUN 9 21*  CREATININE 0.91 1.61*  CALCIUM 8.7* 7.9*   PT/INR No results for input(s): LABPROT, INR in the last 72 hours. Comprehensive Metabolic Panel:    Component Value Date/Time   NA 144 12/23/2016 0751   NA 138 12/22/2016 0141   K 4.2 12/23/2016 0751   K 3.7 12/22/2016 0141   CL 114 (H) 12/23/2016 0751   CL 110 12/22/2016 0141   CO2 18 (L) 12/23/2016 0751   CO2 18 (L) 12/22/2016 0141   BUN 21 (H) 12/23/2016 0751   BUN 9 12/22/2016 0141   CREATININE 1.61 (H) 12/23/2016 0751   CREATININE 0.91 12/22/2016 0141   GLUCOSE 147 (H) 12/23/2016 0751   GLUCOSE 154 (H) 12/22/2016 0141   CALCIUM 7.9 (L) 12/23/2016 0751   CALCIUM 8.7  (L) 12/22/2016 0141   AST 22 12/23/2016 0751   AST 37 12/22/2016 0141   ALT 17 12/23/2016 0751   ALT 22 12/22/2016 0141   ALKPHOS 41 12/23/2016 0751   ALKPHOS 59 12/22/2016 0141   BILITOT 1.1 12/23/2016 0751   BILITOT 0.8 12/22/2016 0141   PROT 5.9 (L) 12/23/2016 0751   PROT 6.9 12/22/2016 0141   ALBUMIN 2.3 (L) 12/23/2016 0751   ALBUMIN 2.7 (L) 12/22/2016 0141     Studies/Results: Ct Abdomen Pelvis Wo Contrast  Addendum Date: 12/22/2016   ADDENDUM REPORT: 12/22/2016 09:38 ADDENDUM: The original report was by Dr. Gaylyn Rong. The following addendum is by Dr. Gaylyn Rong: Critical Value/emergent results were called by telephone at the time of interpretation on 12/22/2016 at 9:35 am to Dr. Isla Pence, who verbally acknowledged these results. Electronically Signed   By: Gaylyn Rong M.D.   On: 12/22/2016 09:38   Result Date: 12/22/2016 CLINICAL DATA:  Nausea and vomiting.  Leukocytosis and diarrhea. EXAM: CT ABDOMEN AND PELVIS WITHOUT CONTRAST TECHNIQUE: Multidetector CT imaging of the abdomen and pelvis was performed following the standard protocol without IV contrast. COMPARISON:  12/08/2016 CT scan FINDINGS: Lower chest: Mildly progressive airspace opacity medially at the left lung base with some adjacent nodularity, likely a combination of atelectasis and pneumonia. Mild cardiomegaly with mitral valve calcifications  and coronary artery atherosclerotic calcifications. Hepatobiliary: Gas tracks within the liver. Although by itself this would seem indeterminate for portal venous gas versus pneumobilia, there is clearly some portal venous gas in the abdomen on image 46/2, and accordingly the gas in the liver is felt to be portal venous gas as well. Large dense calcification posteriorly in the right hepatic lobe is stable. Cholelithiasis is noted without gas in the gallbladder. Pancreas: Unremarkable Spleen: Unremarkable Adrenals/Urinary Tract: Unremarkable Stomach/Bowel:  Infraumbilical ventral hernia is now shown to contain a loop of bowel in addition to fluid in adipose tissue, with scattered punctate gas density which is extraluminal. Much of this is probably within portal venous branches contained in the hernia, but some could represent free air is well. Numerous diverticula throughout the colon especially in the ascending, transverse, did an descending colon as well as the proximal sigmoid. Scattered air- fluid levels both in the colon and in small bowel. A nasogastric tube terminates in the stomach body. There are air-fluid levels in the rectum. Vascular/Lymphatic: Portal venous gas centrally in the abdomen for example on image 46 of series 2, and also in the small vessels herniated through the umbilical hernia. Aortoiliac atherosclerotic vascular disease. Reproductive: Uterus absent.  Adnexa unremarkable. Other: New perihepatic ascites. Mild mesenteric edema. Trace gas anterior to the left hepatic lobe on image 10/2, questionably within a portal venous structure versus free air. I favor this being in venous structures. Musculoskeletal: New 4.0 by 2.7 cm mass in the subcutaneous tissues of the right lower anterior abdominal wall on image 70/2, probably a hematoma. Several smaller similar lesions on the left side. Stable 50% superior endplate compression fracture of L2, likely subacute given the sclerosis, with a notable Schmorl's node. Endplate sclerosis eccentric to the right along the inferior part of L1. 30% chronic superior endplate compression fracture at L5. There appears to be a partially calcified very large central disc protrusion at the L2- 3 level causing prominent central narrowing of the thecal sac. Foraminal impingement at L1-2, L2- 3, L3-4, and L4-5. Mild levoconvex lumbar scoliosis. Notable degenerative arthropathy of both hips. IMPRESSION: 1. The umbilical hernia now contains bowel in addition to fluid and omental adipose tissue, and there is portal venous gas  in both this hernia and hand intra-abdominal portal veins and in the liver, concerning for possible bowel necrosis particularly involving the herniated segment. There is also new perihepatic ascites. Urgent surgical consultation recommended. 2. Mildly progressive airspace opacity medially at the left lung base likely combination of atelectasis and pneumonia. The 3. Scattered air-fluid levels in small and large bowel, possibly secondary ileus. Numerous colonic diverticula. Nasogastric tube in the stomach. 4. 4.0 by 2.7 cm subcutaneous hematoma along the right lower anterior abdominal wall. 5. Other imaging findings of potential clinical significance: Mild cardiomegaly. Coronary atherosclerosis. Cholelithiasis. Aortoiliac atherosclerotic vascular disease. Mild mesenteric edema. Late subacute 50% superior endplate compression fracture of L2, stable. 30% chronic superior endplate compression fracture at L5. Calcified posterior central disc protrusion at L2- 3 causing prominent central narrowing of the thecal sac. Lumbar spondylosis and degenerative disc disease along with scoliosis causing foraminal impingement at L1- 2, L2- 3, L3-4, and L4-5. Notable degenerative arthropathy of both hips. Radiology assistant personnel have been notified to put me in telephone contact with the referring physician or the referring physician's clinical representative in order to discuss these findings, given their potentially emergent nature. Personnel have been paged and I am awaiting the callback. Once this communication is established I will issue  an addendum to this report for documentation purposes. Electronically Signed: By: Gaylyn Rong M.D. On: 12/22/2016 09:28   Dg Abd 1 View  Result Date: 12/21/2016 CLINICAL DATA:  Nausea vomiting and diarrhea EXAM: ABDOMEN - 1 VIEW COMPARISON:  12/19/2000 FINDINGS: Marked gaseous dilatation of the stomach. Remainder of the gas pattern appears nonobstructed. Multiple calcified pelvic  phleboliths. Calcification in the right upper quadrant. IMPRESSION: 1. Marked gaseous dilatation of the stomach, increased compared to prior, outlet obstruction cannot be excluded. Remainder of the gas pattern appears nonobstructed. Electronically Signed   By: Jasmine Pang M.D.   On: 12/21/2016 22:48   Dg Chest Port 1 View  Result Date: 12/23/2016 CLINICAL DATA:  Rhonchi EXAM: PORTABLE CHEST 1 VIEW COMPARISON:  December 12, 2016 FINDINGS: Nasogastric tube tip and side port are below the diaphragm. No pneumothorax. There is airspace consolidation in the medial left base. Lungs elsewhere are clear. Heart is mildly enlarged with pulmonary vascularity within normal limits. No adenopathy. No bone lesions. IMPRESSION: Medial left base airspace consolidation. Lungs elsewhere clear. Stable cardiac prominence. Nasogastric tube tip and side port below the diaphragm. Electronically Signed   By: Bretta Bang III M.D.   On: 12/23/2016 09:57   Dg Abd Portable 1v  Result Date: 12/22/2016 CLINICAL DATA:  NG tube placement. EXAM: PORTABLE ABDOMEN - 1 VIEW COMPARISON:  12/21/2016 FINDINGS: Enteric tube tip is in the left upper quadrant consistent with location in the upper stomach. Proximal side hole is localized just distal to the expected location of the EG junction. The stomach as decompressed since the previous study. IMPRESSION: Enteric tube tip is in the left upper quadrant consistent with location in the upper stomach. Interval decompression of the stomach. Electronically Signed   By: Burman Nieves M.D.   On: 12/22/2016 01:56    Anti-infectives: Anti-infectives    Start     Dose/Rate Route Frequency Ordered Stop   12/22/16 1100  piperacillin-tazobactam (ZOSYN) IVPB 3.375 g     3.375 g 12.5 mL/hr over 240 Minutes Intravenous Every 8 hours 12/22/16 1026     12/08/16 1200  piperacillin-tazobactam (ZOSYN) IVPB 3.375 g  Status:  Discontinued     3.375 g 12.5 mL/hr over 240 Minutes Intravenous Every 8 hours  12/08/16 0717 12/11/16 0846   12/08/16 0345  vancomycin (VANCOCIN) IVPB 1000 mg/200 mL premix     1,000 mg 200 mL/hr over 60 Minutes Intravenous  Once 12/08/16 0333 12/08/16 0740   12/08/16 0345  piperacillin-tazobactam (ZOSYN) IVPB 3.375 g     3.375 g 100 mL/hr over 30 Minutes Intravenous  Once 12/08/16 0333 12/08/16 0741       Ab Leaming J 12/23/2016

## 2016-12-23 NOTE — Anesthesia Postprocedure Evaluation (Addendum)
Anesthesia Post Note  Patient: Cleda Mccreedylly M Aytes  Procedure(s) Performed: Procedure(s) (LRB): EXPLORATORY LAPAROTOMY (N/A) HERNIA REPAIR UMBILICAL ADULT (N/A) LYSIS OF ADHESION (N/A)  Patient location during evaluation: PACU Anesthesia Type: General Level of consciousness: awake Pain management: pain level controlled Vital Signs Assessment: post-procedure vital signs reviewed and stable Respiratory status: spontaneous breathing, nonlabored ventilation, respiratory function stable and patient connected to nasal cannula oxygen Cardiovascular status: stable Postop Assessment: no signs of nausea or vomiting Anesthetic complications: no       Last Vitals:  Vitals:   12/23/16 0501 12/23/16 0925  BP: 120/78 (!) 130/50  Pulse: 97 (!) 101  Resp: 19 18  Temp: 36.5 C 36.8 C    Last Pain:  Vitals:   12/23/16 0925  TempSrc: Oral  PainSc:                  Keary Hanak

## 2016-12-23 NOTE — Progress Notes (Signed)
RT gave the Pt a breathing Tx and the daughter of the Pt was in the room. The Pt had a procedure and has an NG tube. I would ask the pt questions and her response was very weak( RT had a hard time understanding her). RT was persistant to get her to say her daughters name and to say where she lived. RT asked the the Pt several times until the Pt was frustrated hoping to get the Pt to speak clearer and louder. The pt was a  Little more understandable. RT also worked with the IS and the Pt was only able to do 250. I expressed to the Pt's daughter that her mother needed to take deeper breath and that her mother also needed to try to get at least 500 on the IS. The daughter was waiting to talk to the nurse about her mothers progress. The Pt was sitting in a chair and the NG tube seemed to be irritating her, RT will continue to monitor.

## 2016-12-23 NOTE — Progress Notes (Signed)
PROGRESS NOTE    Adrienne [name]  ZOX:[id number] DOB: 1936-03-24 DOA: 12/08/2016 PCP: [name], MD   Brief Narrative:  [name] with pmhx significant for DM, HTN, OSA, and dementia who presented from her nursing home with abdominal distention and respiratory distress. Patient was found to have ileus vs SBO, high lactic acid. She was intubated on 1/24 to 1/27. Transferred to [facility] from [facility] on 12/13/2016. Patient improved clinically and repeat x-ray showed improvement with small bowel obstruction. Patient also noted to have diarrhea and had rectal tube placed. C. difficile PCR was negative. Abdominal pain had improved. Patient was placed on a diet which she was tolerating. Patient was to be discharged to the skilled nursing facility on 12/21/2016 however developed abdominal pain with significant emesis and discharge was canceled. KUB done was concerning for gastric outlet obstruction. CT abdomen and pelvis done 12/22/2016 was concerning for bowel strangulation and possible atelectasis and pneumonia. Discharge canceled. Patient kept nothing by mouth NG tube in place. General surgery consulted for further evaluation. Patient placed empirically on IV Zosyn.    Assessment & Plan:   Principal Problem:   Strangulation of intestine (HCC) Active Problems:   SBO (small bowel obstruction)   Acute on chronic respiratory failure with hypoxemia (HCC)   Acute renal failure (HCC)   Elevated lactic acid level   Metabolic acidosis   Ileus (HCC)   Ventilator dependent (HCC)   Pressure injury of skin   Oral phase dysphagia   DNR (do not resuscitate) discussion   Palliative care by specialist   Abdominal distention   Leukocytosis   Aspiration pneumonia of left lung (HCC)  #1 incarcerated possible strangulation of ventral hernia with small bowel obstruction Patient was to be discharged to skilled nursing facility on 12/21/2016, however while awaiting transportation patient did have  significant emesis with abdominal pain and a such discharge was canceled. NG tube was placed. KUB obtained on 12/21/2016 showed market gaseous dilatation of the stomach, increased compared to prior, outlet obstruction cannot be excluded. Patient was placed on bowel rest CT abdomen and pelvis was subsequently obtained on the morning of 12/22/2016 which showed ventral hernia containing bowel in addition to fluid and omental adipose tissue, there is portal venous gas in both this hernia and intra-abdominal portal veins in the liver concerning for possible bowel necrosis particularly involving the herniated segment. New peri-hepatic ascites. Lactic acid level is elevated at 3.3. Patient with a leukocytosis with a white count of 15.9. Patient status post exploratory laparotomy with repair primary ventral hernia (Dr. Luisa Hart 12/22/2016). Patient kept nothing by mouth. NG tube in place. Continue IV Zosyn. General surgery following and appreciate input and recommendations.  #2 probable aspiration pneumonia versus healthcare associated pneumonia CT abdomen and pelvis with mild progressive airspace opacity medially in the left lung likely combination of atelectasis and pneumonia. Patient with significant emesis just prior to discharge and concern for possible early aspiration pneumonia. Blood cultures pending. Sputum Gram stain and culture pending. Urine Legionella antigen pending. Urine pneumococcus antigen negative. WBC trending down. Continue empiric IV Zosyn day #2/7.  #3 HTN Patient with borderline blood pressure with systolic blood pressure in the 90s. Patient also noted with elevated lactic acid. Concern for bowel strangulation per CT abdomen and pelvis. Hold patient's beta blocker and ARB. IVF. Patient given a bolus per general surgery of IV fluids. Follow.  #4 leukocytosis Likely secondary to problem #1 and 2. WBC trending down. Sputum Gram stain and culture pending. Urine strep pneumococcus  antigen  negative. Urine Legionella antigen pending. Blood cultures pending. Continue empiric IV Zosyn D2/7.   #5 VDRF Early on in the hospitalization patient had to be intubated from 1/24 - 12/11/2016. Patient extubated around doing fine on nasal cannula. Follow.  #6 acute kidney injury Renal function trending back up. Continue IVF. Avoid nephrotoxins.  #7 hypokalemia Repleted.  #8 acute blood loss anemia/iron deficiency anemia Hemoglobin of 9.4. No overt bleeding. Anemia panel consistent with iron deficiency anemia. Will likely need a dose of IV iron tomorrow. Follow H&H. Transfusion threshold hemoglobin less than 8.  #8 dementia without behavioral disturbance Stable. Patient when medically stable will discharge to skilled nursing facility. Patient will need palliative care to follow-up in the outpatient setting.    DVT prophylaxis: SCDs Code Status: Full Family Communication: Updated patient. No family at bedside. Disposition Plan: Remain in-house. Likely to skilled nursing facility Pending evaluation by general surgery for bowel strangulation. Per general surgery.   Consultants:   General surgery: Dr.Tsuei 12/08/2016  Wound care  Palliative care Lorinda Creed, NP 12/19/2016  Procedures:   CT abdomen and pelvis 12/08/2006, 12/22/2016  Abdominal films 12/08/2016, 12/09/2016, 12/10/2016, 12/11/2016, 12/15/2016, 12/19/2016, 12/21/2016, 12/22/2016,  Chest x-ray 12/12/2016, 12/10/2016, 12/09/2016, 12/08/2016  Exploratory laparotomy with repair of ventral hernia.  Antimicrobials:  IV Zosyn 12/22/2016     Subjective: Patient sleeping however easily arousable. NG tube in place. Patient with complaints of abdominal pain. No further emesis. No shortness of breath. No chest pain.  Objective: Vitals:   12/22/16 2120 12/23/16 0501 12/23/16 0851 12/23/16 0925  BP: 108/84 120/78  (!) 130/50  Pulse: (!) 104 97  (!) 101  Resp: 18 19  18   Temp: 98 F (36.7 C) 97.7 F (36.5 C)  98.2  F (36.8 C)  TempSrc: Oral Oral  Oral  SpO2: 99% 97% 98% 97%  Weight: 99.8 kg (220 lb)     Height: 5\' 4"  (1.626 m)       Intake/Output Summary (Last 24 hours) at 12/23/16 1323 Last data filed at 12/23/16 1231  Gross per 24 hour  Intake          2671.25 ml  Output             1205 ml  Net          1466.25 ml   Filed Weights   12/20/16 2017 12/21/16 2028 12/22/16 2120  Weight: 93.9 kg (207 lb) 94.3 kg (208 lb) 99.8 kg (220 lb)    Examination:  General exam: Scared. NGT in place.  Respiratory system: Clear to auscultation Anterior lung fields. Respiratory effort normal. Cardiovascular system: S1 & S2 heard, RRR. No JVD, murmurs, rubs, gallops or clicks. No pedal edema. Gastrointestinal system: Abdomen is soft and hypoactive bowel sounds. Abdominal binder in place. Central nervous system: Alert and oriented. No focal neurological deficits. Extremities: Symmetric 5 x 5 power. Skin: No rashes, lesions or ulcers Psychiatry: Judgement and insight appear normal. Mood & affect appropriate.     Data Reviewed: I have personally reviewed following labs and imaging studies  CBC:  Recent Labs Lab 12/17/16 0645 12/19/16 0435 12/20/16 0617 12/22/16 0141 12/23/16 0613  WBC 14.1* 13.5* 11.9* 15.9* 7.4  NEUTROABS  --   --   --  13.6* 4.6  HGB 10.3* 10.5* 10.4* 11.8* 9.4*  HCT 33.4* 33.1* 33.0* 37.4 30.2*  MCV 99.7 97.4 98.5 98.9 101.0*  PLT 209 292 252 329 262   Basic Metabolic Panel:  Recent Labs Lab 12/17/16 1042 12/19/16  1610 12/20/16 0617 12/22/16 0141 12/23/16 0751  NA 142 139 138 138 144  K 4.1 3.3* 3.6 3.7 4.2  CL 116* 109 112* 110 114*  CO2 18* 22 20* 18* 18*  GLUCOSE 147* 148* 156* 154* 147*  BUN 11 7 7 9  21*  CREATININE 0.67 0.63 0.58 0.91 1.61*  CALCIUM 8.4* 8.5* 8.4* 8.7* 7.9*  MG  --   --   --   --  1.2*  PHOS 2.5 3.6  --   --   --    GFR: Estimated Creatinine Clearance: 33.1 mL/min (by C-G formula based on SCr of 1.61 mg/dL (H)). Liver Function  Tests:  Recent Labs Lab 12/17/16 1042 12/19/16 0435 12/22/16 0141 12/23/16 0751  AST  --   --  37 22  ALT  --   --  22 17  ALKPHOS  --   --  59 41  BILITOT  --   --  0.8 1.1  PROT  --   --  6.9 5.9*  ALBUMIN 2.6* 2.5* 2.7* 2.3*   No results for input(s): LIPASE, AMYLASE in the last 168 hours. No results for input(s): AMMONIA in the last 168 hours. Coagulation Profile: No results for input(s): INR, PROTIME in the last 168 hours. Cardiac Enzymes: No results for input(s): CKTOTAL, CKMB, CKMBINDEX, TROPONINI in the last 168 hours. BNP (last 3 results) No results for input(s): PROBNP in the last 8760 hours. HbA1C: No results for input(s): HGBA1C in the last 72 hours. CBG:  Recent Labs Lab 12/22/16 2030 12/22/16 2348 12/23/16 0443 12/23/16 0753 12/23/16 1219  GLUCAP 136* 141* 151* 121* 139*   Lipid Profile: No results for input(s): CHOL, HDL, LDLCALC, TRIG, CHOLHDL, LDLDIRECT in the last 72 hours. Thyroid Function Tests: No results for input(s): TSH, T4TOTAL, FREET4, T3FREE, THYROIDAB in the last 72 hours. Anemia Panel:  Recent Labs  12/23/16 0920  FERRITIN 212  TIBC 209*  IRON 9*   Sepsis Labs:  Recent Labs Lab 12/22/16 0141 12/22/16 0623 12/22/16 0806 12/23/16 0920  LATICACIDVEN 3.1* 3.3* 3.3* 1.8    Recent Results (from the past 240 hour(s))  Culture, blood (routine x 2) Call MD if unable to obtain prior to antibiotics being given     Status: None (Preliminary result)   Collection Time: 12/22/16 11:13 AM  Result Value Ref Range Status   Specimen Description BLOOD LEFT HAND  Final   Special Requests IN PEDIATRIC BOTTLE 1CC  Final   Culture NO GROWTH 1 DAY  Final   Report Status PENDING  Incomplete  Culture, blood (routine x 2) Call MD if unable to obtain prior to antibiotics being given     Status: None (Preliminary result)   Collection Time: 12/22/16 11:24 AM  Result Value Ref Range Status   Specimen Description BLOOD LEFT HAND  Final   Special  Requests IN PEDIATRIC BOTTLE 3CC  Final   Culture NO GROWTH 1 DAY  Final   Report Status PENDING  Incomplete         Radiology Studies: Ct Abdomen Pelvis Wo Contrast  Addendum Date: 12/22/2016   ADDENDUM REPORT: 12/22/2016 09:38 ADDENDUM: The original report was by Dr. Gaylyn Rong. The following addendum is by Dr. Gaylyn Rong: Critical Value/emergent results were called by telephone at the time of interpretation on 12/22/2016 at 9:35 am to Dr. Isla Pence, who verbally acknowledged these results. Electronically Signed   By: Gaylyn Rong M.D.   On: 12/22/2016 09:38   Result Date: 12/22/2016 CLINICAL DATA:  Nausea and vomiting.  Leukocytosis and diarrhea. EXAM: CT ABDOMEN AND PELVIS WITHOUT CONTRAST TECHNIQUE: Multidetector CT imaging of the abdomen and pelvis was performed following the standard protocol without IV contrast. COMPARISON:  12/08/2016 CT scan FINDINGS: Lower chest: Mildly progressive airspace opacity medially at the left lung base with some adjacent nodularity, likely a combination of atelectasis and pneumonia. Mild cardiomegaly with mitral valve calcifications and coronary artery atherosclerotic calcifications. Hepatobiliary: Gas tracks within the liver. Although by itself this would seem indeterminate for portal venous gas versus pneumobilia, there is clearly some portal venous gas in the abdomen on image 46/2, and accordingly the gas in the liver is felt to be portal venous gas as well. Large dense calcification posteriorly in the right hepatic lobe is stable. Cholelithiasis is noted without gas in the gallbladder. Pancreas: Unremarkable Spleen: Unremarkable Adrenals/Urinary Tract: Unremarkable Stomach/Bowel: Infraumbilical ventral hernia is now shown to contain a loop of bowel in addition to fluid in adipose tissue, with scattered punctate gas density which is extraluminal. Much of this is probably within portal venous branches contained in the hernia, but some could  represent free air is well. Numerous diverticula throughout the colon especially in the ascending, transverse, did an descending colon as well as the proximal sigmoid. Scattered air- fluid levels both in the colon and in small bowel. A nasogastric tube terminates in the stomach body. There are air-fluid levels in the rectum. Vascular/Lymphatic: Portal venous gas centrally in the abdomen for example on image 46 of series 2, and also in the small vessels herniated through the umbilical hernia. Aortoiliac atherosclerotic vascular disease. Reproductive: Uterus absent.  Adnexa unremarkable. Other: New perihepatic ascites. Mild mesenteric edema. Trace gas anterior to the left hepatic lobe on image 10/2, questionably within a portal venous structure versus free air. I favor this being in venous structures. Musculoskeletal: New 4.0 by 2.7 cm mass in the subcutaneous tissues of the right lower anterior abdominal wall on image 70/2, probably a hematoma. Several smaller similar lesions on the left side. Stable 50% superior endplate compression fracture of L2, likely subacute given the sclerosis, with a notable Schmorl's node. Endplate sclerosis eccentric to the right along the inferior part of L1. 30% chronic superior endplate compression fracture at L5. There appears to be a partially calcified very large central disc protrusion at the L2- 3 level causing prominent central narrowing of the thecal sac. Foraminal impingement at L1-2, L2- 3, L3-4, and L4-5. Mild levoconvex lumbar scoliosis. Notable degenerative arthropathy of both hips. IMPRESSION: 1. The umbilical hernia now contains bowel in addition to fluid and omental adipose tissue, and there is portal venous gas in both this hernia and hand intra-abdominal portal veins and in the liver, concerning for possible bowel necrosis particularly involving the herniated segment. There is also new perihepatic ascites. Urgent surgical consultation recommended. 2. Mildly progressive  airspace opacity medially at the left lung base likely combination of atelectasis and pneumonia. The 3. Scattered air-fluid levels in small and large bowel, possibly secondary ileus. Numerous colonic diverticula. Nasogastric tube in the stomach. 4. 4.0 by 2.7 cm subcutaneous hematoma along the right lower anterior abdominal wall. 5. Other imaging findings of potential clinical significance: Mild cardiomegaly. Coronary atherosclerosis. Cholelithiasis. Aortoiliac atherosclerotic vascular disease. Mild mesenteric edema. Late subacute 50% superior endplate compression fracture of L2, stable. 30% chronic superior endplate compression fracture at L5. Calcified posterior central disc protrusion at L2- 3 causing prominent central narrowing of the thecal sac. Lumbar spondylosis and degenerative disc disease along with scoliosis causing  foraminal impingement at L1- 2, L2- 3, L3-4, and L4-5. Notable degenerative arthropathy of both hips. Radiology assistant personnel have been notified to put me in telephone contact with the referring physician or the referring physician's clinical representative in order to discuss these findings, given their potentially emergent nature. Personnel have been paged and I am awaiting the callback. Once this communication is established I will issue an addendum to this report for documentation purposes. Electronically Signed: By: Gaylyn Rong M.D. On: 12/22/2016 09:28   Dg Abd 1 View  Result Date: 12/21/2016 CLINICAL DATA:  Nausea vomiting and diarrhea EXAM: ABDOMEN - 1 VIEW COMPARISON:  12/19/2000 FINDINGS: Marked gaseous dilatation of the stomach. Remainder of the gas pattern appears nonobstructed. Multiple calcified pelvic phleboliths. Calcification in the right upper quadrant. IMPRESSION: 1. Marked gaseous dilatation of the stomach, increased compared to prior, outlet obstruction cannot be excluded. Remainder of the gas pattern appears nonobstructed. Electronically Signed   By: Jasmine Pang M.D.   On: 12/21/2016 22:48   Dg Chest Port 1 View  Result Date: 12/23/2016 CLINICAL DATA:  Rhonchi EXAM: PORTABLE CHEST 1 VIEW COMPARISON:  December 12, 2016 FINDINGS: Nasogastric tube tip and side port are below the diaphragm. No pneumothorax. There is airspace consolidation in the medial left base. Lungs elsewhere are clear. Heart is mildly enlarged with pulmonary vascularity within normal limits. No adenopathy. No bone lesions. IMPRESSION: Medial left base airspace consolidation. Lungs elsewhere clear. Stable cardiac prominence. Nasogastric tube tip and side port below the diaphragm. Electronically Signed   By: Bretta Bang III M.D.   On: 12/23/2016 09:57   Dg Abd Portable 1v  Result Date: 12/22/2016 CLINICAL DATA:  NG tube placement. EXAM: PORTABLE ABDOMEN - 1 VIEW COMPARISON:  12/21/2016 FINDINGS: Enteric tube tip is in the left upper quadrant consistent with location in the upper stomach. Proximal side hole is localized just distal to the expected location of the EG junction. The stomach as decompressed since the previous study. IMPRESSION: Enteric tube tip is in the left upper quadrant consistent with location in the upper stomach. Interval decompression of the stomach. Electronically Signed   By: Burman Nieves M.D.   On: 12/22/2016 01:56        Scheduled Meds: . enoxaparin (LOVENOX) injection  40 mg Subcutaneous Q24H  . famotidine  20 mg Per Tube BID  . guaiFENesin  1,200 mg Oral BID  . insulin aspart  0-9 Units Subcutaneous Q4H  . ipratropium-albuterol  3 mL Nebulization TID  . lactated ringers  500 mL Intravenous Once  . magnesium sulfate 1 - 4 g bolus IVPB  4 g Intravenous Once  . mouth rinse  15 mL Mouth Rinse BID  . metoprolol tartrate  12.5 mg Per Tube BID  . piperacillin-tazobactam (ZOSYN)  IV  3.375 g Intravenous Q8H  . potassium chloride  40 mEq Per Tube Daily   Continuous Infusions: . sodium chloride 75 mL/hr at 12/22/16 1854     LOS: 15 days     Time spent: 40 mins    THOMPSON,DANIEL, MD [facility] Pager [phone]  If 7PM-7AM, please contact night-coverage [email or web address] Password [email or web address] 12/23/2016, 1:23 PM

## 2016-12-23 NOTE — Progress Notes (Signed)
Occupational Therapy Treatment Patient Details Name: Adrienne [name] MRN: [id number] DOB: [birth year] Today's Date: 12/23/2016    History of present illness 81 yo female admitted with distended abdomen/nausea, ileus vs SBO, vent d/c 12/11/16. Acute respiratory failure with hypoxia: Required mechanical ventilator  Exploratory laparotomy with repair primary a ventral hernia 2/7.   OT comments  Pt able to sit EOB x 10 min with occasional min A for balance when distracted. Total A to chair. Staff will need to use Maximove to mobilize pt OOB daily. [facility]   Follow Up Recommendations  SNF;Supervision/Assistance - 24 hour    Equipment Recommendations  3 in 1 bedside commode;Hospital bed;Wheelchair cushion (measurements OT);Wheelchair (measurements OT);Other (comment)    Recommendations for Other Services      Precautions / Restrictions Precautions Precautions: Fall Precaution Comments: NG tube/abdominal binder. JP drain Required Braces or Orthoses: Other Brace/Splint (abdominal binder)       Mobility Bed Mobility Overal bed mobility: Needs Assistance Bed Mobility: Supine to Sit Rolling: Total assist;+2 for physical assistance         General bed mobility comments: bed pad used to move pt to EOB  Transfers                 General transfer comment: Attempted to use stedy to assist wtih transfers. Pt unablet o shift weight anteriorly and appeared to be pushing posteriorly. Pt was total lifted to chair. Staff will need to use maximove    Balance     Sitting balance-Leahy Scale: Poor Sitting balance - Comments: pt with posterior lean and R lat lean at times     Standing balance-Leahy Scale: Zero                     ADL       Grooming: Wash/dry hands;Wash/dry face;Minimal assistance                                 General ADL Comments: limited by abdominal pain      Vision                     Perception      Praxis      Cognition   Behavior During Therapy: Anxious Overall Cognitive Status: Impaired/Different from baseline                  General Comments: Baseline dementia. Slow processing and problem solving. Poor initiation    Extremity/Trunk Assessment               Exercises     Shoulder Instructions       General Comments      Pertinent Vitals/ Pain       Pain Assessment: Faces Faces Pain Scale: Hurts whole lot Pain Location: abdomen Pain Descriptors / Indicators: Discomfort;Grimacing;Moaning Pain Intervention(s): Limited activity within patient's tolerance;Repositioned  Home Living                                          Prior Functioning/Environment              Frequency  Min 2X/week        Progress Toward Goals  OT Goals(current goals can now be found in the care plan section)  Progress towards  OT goals: Progressing toward goals  Acute Rehab OT Goals Patient Stated Goal: none stated OT Goal Formulation: With patient Time For Goal Achievement: 12/29/16 Potential to Achieve Goals: Good ADL Goals Pt Will Perform Grooming: with supervision;sitting Pt Will Perform Upper Body Bathing: with supervision;sitting Pt Will Transfer to Toilet: with +2 assist;with min assist;bedside commode;stand pivot transfer Additional ADL Goal #1: Pt will complete bed mobility total +2 min (A) as precursor to adls.   Plan Discharge plan remains appropriate    Co-evaluation    PT/OT/SLP Co-Evaluation/Treatment: Yes Reason for Co-Treatment: Complexity of the patient's impairments (multi-system involvement);For patient/therapist safety;To address functional/ADL transfers   OT goals addressed during session: ADL's and self-care      End of Session Equipment Utilized During Treatment: Oxygen   Activity Tolerance Patient tolerated treatment well   Patient Left in chair;with call bell/phone within reach   Nurse Communication Need  for lift equipment Grant Ruts)        Time: 4970-2637 OT Time Calculation (min): 30 min  Charges: OT General Charges $OT Visit: 1 Procedure OT Treatments $Self Care/Home Management : 8-22 mins  Dakiya Puopolo,HILLARY 12/23/2016, 3:29 PM   [facility], OT/L  [phone] 12/23/2016

## 2016-12-23 NOTE — Progress Notes (Signed)
Messaged MD to change order for Mucinex to per tube instead of po.   Larey Dayshristy M Kalep Full, RN

## 2016-12-24 DIAGNOSIS — K9189 Other postprocedural complications and disorders of digestive system: Secondary | ICD-10-CM

## 2016-12-24 DIAGNOSIS — K567 Ileus, unspecified: Secondary | ICD-10-CM | POA: Diagnosis not present

## 2016-12-24 LAB — BASIC METABOLIC PANEL
Anion gap: 14 (ref 5–15)
BUN: 22 mg/dL — ABNORMAL HIGH (ref 6–20)
CALCIUM: 8.3 mg/dL — AB (ref 8.9–10.3)
CO2: 17 mmol/L — AB (ref 22–32)
CREATININE: 0.94 mg/dL (ref 0.44–1.00)
Chloride: 114 mmol/L — ABNORMAL HIGH (ref 101–111)
GFR calc non Af Amer: 57 mL/min — ABNORMAL LOW (ref >60)
GLUCOSE: 124 mg/dL — AB (ref 65–99)
Potassium: 3.7 mmol/L (ref 3.5–5.1)
Sodium: 145 mmol/L (ref 135–145)

## 2016-12-24 LAB — LEGIONELLA PNEUMOPHILA SEROGP 1 UR AG: L. pneumophila Serogp 1 Ur Ag: NEGATIVE

## 2016-12-24 LAB — MAGNESIUM: MAGNESIUM: 2 mg/dL (ref 1.7–2.4)

## 2016-12-24 LAB — CBC
HEMATOCRIT: 30.5 % — AB (ref 36.0–46.0)
Hemoglobin: 9.3 g/dL — ABNORMAL LOW (ref 12.0–15.0)
MCH: 31.2 pg (ref 26.0–34.0)
MCHC: 30.5 g/dL (ref 30.0–36.0)
MCV: 102.3 fL — AB (ref 78.0–100.0)
Platelets: 245 10*3/uL (ref 150–400)
RBC: 2.98 MIL/uL — ABNORMAL LOW (ref 3.87–5.11)
RDW: 17.1 % — AB (ref 11.5–15.5)
WBC: 8.2 10*3/uL (ref 4.0–10.5)

## 2016-12-24 LAB — GLUCOSE, CAPILLARY
GLUCOSE-CAPILLARY: 112 mg/dL — AB (ref 65–99)
Glucose-Capillary: 112 mg/dL — ABNORMAL HIGH (ref 65–99)
Glucose-Capillary: 123 mg/dL — ABNORMAL HIGH (ref 65–99)
Glucose-Capillary: 128 mg/dL — ABNORMAL HIGH (ref 65–99)
Glucose-Capillary: 145 mg/dL — ABNORMAL HIGH (ref 65–99)
Glucose-Capillary: 164 mg/dL — ABNORMAL HIGH (ref 65–99)

## 2016-12-24 LAB — CREATININE, URINE, RANDOM: Creatinine, Urine: 149.6 mg/dL

## 2016-12-24 LAB — SODIUM, URINE, RANDOM: Sodium, Ur: 66 mmol/L

## 2016-12-24 MED ORDER — DEXTROSE-NACL 5-0.45 % IV SOLN
INTRAVENOUS | Status: AC
  Administered 2016-12-24 – 2016-12-27 (×3): via INTRAVENOUS

## 2016-12-24 MED ORDER — CLINIMIX E/DEXTROSE (4.25/25) 4.25 % IV SOLN
INTRAVENOUS | Status: AC
  Administered 2016-12-24: 17:00:00 via INTRAVENOUS
  Filled 2016-12-24: qty 960

## 2016-12-24 MED ORDER — ADULT MULTIVITAMIN LIQUID CH
15.0000 mL | Freq: Every day | ORAL | Status: DC
  Administered 2016-12-24 – 2016-12-27 (×4): 15 mL
  Filled 2016-12-24 (×4): qty 15

## 2016-12-24 MED ORDER — FAT EMULSION 20 % IV EMUL
240.0000 mL | INTRAVENOUS | Status: AC
  Administered 2016-12-24: 240 mL via INTRAVENOUS
  Filled 2016-12-24: qty 250

## 2016-12-24 MED ORDER — SODIUM CHLORIDE 0.9 % IV SOLN
510.0000 mg | Freq: Once | INTRAVENOUS | Status: AC
  Administered 2016-12-24: 510 mg via INTRAVENOUS
  Filled 2016-12-24: qty 17

## 2016-12-24 MED ORDER — DEXTROSE-NACL 5-0.45 % IV SOLN
INTRAVENOUS | Status: DC
  Administered 2016-12-24: 09:00:00 via INTRAVENOUS

## 2016-12-24 MED ORDER — DEXTROSE-NACL 5-0.45 % IV SOLN: INTRAVENOUS | Status: DC

## 2016-12-24 MED ORDER — SODIUM CHLORIDE 0.9% FLUSH
10.0000 mL | INTRAVENOUS | Status: DC | PRN
  Administered 2016-12-24 – 2016-12-29 (×2): 10 mL
  Administered 2016-12-30 – 2017-01-01 (×2): 20 mL
  Filled 2016-12-24 (×4): qty 40

## 2016-12-24 MED ORDER — POTASSIUM CHLORIDE 20 MEQ/15ML (10%) PO SOLN
20.0000 meq | Freq: Once | ORAL | Status: AC
  Administered 2016-12-24: 20 meq
  Filled 2016-12-24: qty 15

## 2016-12-24 NOTE — Care Management Important Message (Signed)
Important Message  Patient Details  Name: Adrienne [name] MRN: [id number] Date of Birth: Sep 18, 1936   Medicare Important Message Given:  Yes    Richy Spradley Stefan ChurchBratton 12/24/2016, 3:19 PM

## 2016-12-24 NOTE — Progress Notes (Signed)
Peripherally Inserted Central Catheter/Midline Placement  The IV Nurse has discussed with the patient and/or persons authorized to consent for the patient, the purpose of this procedure and the potential benefits and risks involved with this procedure.  The benefits include less needle sticks, lab draws from the catheter, and the patient may be discharged home with the catheter. Risks include, but not limited to, infection, bleeding, blood clot (thrombus formation), and puncture of an artery; nerve damage and irregular heartbeat and possibility to perform a PICC exchange if needed/ordered by physician.  Alternatives to this procedure were also discussed.  Bard Power PICC patient education guide, fact sheet on infection prevention and patient information card has been provided to patient /or left at bedside.    PICC/Midline Placement Documentation  PICC Double Lumen 12/24/16 PICC Right Basilic 40 cm 1 cm (Active)  Indication for Insertion or Continuance of Line Administration of hyperosmolar/irritating solutions (i.e. TPN, Vancomycin, etc.) 12/24/2016  3:30 PM  Exposed Catheter (cm) 1 cm 12/24/2016  3:30 PM  Site Assessment Clean;Dry;Intact 12/24/2016  3:30 PM  Lumen #1 Status Flushed;Saline locked;Blood return noted 12/24/2016  3:30 PM  Dressing Change Due 12/31/16 12/24/2016  3:30 PM   Consent obtained vi9a telephone with daughter Ronny Baconngela Robinson    Kiylee Thoreson Terry 12/24/2016, 3:34 PM

## 2016-12-24 NOTE — Progress Notes (Signed)
Physical Therapy Treatment Patient Details Name: Adrienne [name] MRN: [id number] DOB: 05/27/1936 Today's Date: 12/24/2016    History of Present Illness 81 yo female admitted with distended abdomen/nausea, ileus vs SBO, vent d/c 12/11/16. Acute respiratory failure with hypoxia: Required mechanical ventilator  Exploratory laparotomy with repair primary a ventral hernia 2/7.    PT Comments    Limited ability for pt to assist today due to pain in lower abdome  Follow Up Recommendations  SNF     Equipment Recommendations  Other (comment)    Recommendations for Other Services       Precautions / Restrictions Precautions Precautions: Fall Precaution Comments: NG tube/abdominal binder. JP drain Required Braces or Orthoses: Other Brace/Splint    Mobility  Bed Mobility Overal bed mobility: Needs Assistance Bed Mobility: Rolling Rolling: Total assist;+2 for physical assistance         General bed mobility comments: Multiple trials of rolling to check sacral wound and position for maxi lift.  Transfers                 General transfer comment: pt too sore for transfer attempt today, so used maximove to get her positioned well in a recliner.  Ambulation/Gait             General Gait Details: unable   Stairs            Wheelchair Mobility    Modified Rankin (Stroke Patients Only)       Balance   Sitting-balance support: Bilateral upper extremity supported;Feet supported Sitting balance-Leahy Scale: Poor       Standing balance-Leahy Scale: Zero                      Cognition Arousal/Alertness: Awake/alert Behavior During Therapy: Anxious Overall Cognitive Status: Impaired/Different from baseline Area of Impairment: Orientation;Memory;Awareness;Problem solving Orientation Level: Place;Time;Situation   Memory: Decreased short-term memory     Awareness: Intellectual Problem Solving: Slow processing;Decreased initiation;Difficulty  sequencing;Requires verbal cues;Requires tactile cues General Comments: Baseline dementia. Slow processing and problem solving. Poor initiation    Exercises General Exercises - Lower Extremity Hip Flexion/Marching: AROM;Strengthening;Both;10 reps;Supine (graded resistance after warm up of stiff knees)    General Comments        Pertinent Vitals/Pain Pain Assessment: Faces Faces Pain Scale: Hurts whole lot Pain Location: abdomen Pain Descriptors / Indicators: Discomfort;Grimacing;Moaning Pain Intervention(s): Monitored during session;Premedicated before session;Limited activity within patient's tolerance    Home Living                      Prior Function            PT Goals (current goals can now be found in the care plan section) Acute Rehab PT Goals Patient Stated Goal: none stated PT Goal Formulation: Patient unable to participate in goal setting Time For Goal Achievement: 12/29/16 Potential to Achieve Goals: Fair Progress towards PT goals: Not progressing toward goals - comment (very sore from exp lap)    Frequency    Min 3X/week      PT Plan Current plan remains appropriate    Co-evaluation             End of Session   Activity Tolerance: Patient limited by pain Patient left: in chair;with call bell/phone within reach;with chair alarm set     Time: [id number] PT Time Calculation (min) (ACUTE ONLY): 32 min  Charges:  $Therapeutic Activity: 23-37 mins  G CodesEliseo [name] Adrienne [name] 12/24/2016, 9:58 AM 12/24/2016  [place] [address] [place], PT [phone]  (pager)

## 2016-12-24 NOTE — Progress Notes (Signed)
Pharmacy Antibiotic Note  Adrienne [name] is a 81 y.o. female admitted on 12/08/2016 with intra-abdominal infection.  Pharmacy has been consulted for Zosyn dosing.  SCr today back down to 0.94, CrCl ~50-1260mL/min.  Plan: Zosyn 3.375g IV q8h (4 hour infusion).  Height: 5\' 4"  (162.6 cm) Weight: 219 lb 12.8 oz (99.7 kg) IBW/kg (Calculated) : 54.7  Temp (24hrs), Avg:98.6 F (37 C), Min:98.5 F (36.9 C), Max:98.7 F (37.1 C)   Recent Labs Lab 12/19/16 0435 12/20/16 0617 12/22/16 0141 12/22/16 0623 12/22/16 0806 12/23/16 0613 12/23/16 0751 12/23/16 0920 12/24/16 0518  WBC 13.5* 11.9* 15.9*  --   --  7.4  --   --  8.2  CREATININE 0.63 0.58 0.91  --   --   --  1.61*  --  0.94  LATICACIDVEN  --   --  3.1* 3.3* 3.3*  --   --  1.8  --     Estimated Creatinine Clearance: 56.6 mL/min (by C-G formula based on SCr of 0.94 mg/dL).    Allergies  Allergen Reactions  . Prednisone Shortness Of Breath  . Ibuprofen Other (See Comments)    Swelling mouth and lips  . Meclizine Other (See Comments)    HALLUCINATIONS  . Sulfonamide Derivatives     REACTION: Reaction not known    Antimicrobials this admission: 1/24 Zosyn >> 1/27; 2/7>> 1/24 Vanc x1 1  Microbiology results: 1/24 BCx x2: neg 1/24 UCx:  neg 1/24 MRSA PCR: neg 2/7 BCx: ngtd 2/8 urine: sent  Thank you for allowing pharmacy to be a part of this patient's care.  Lavine Hargrove D. Lilian Fuhs, PharmD, BCPS Clinical Pharmacist Pager: [phone] 12/24/2016 12:44 PM

## 2016-12-24 NOTE — Progress Notes (Signed)
Nutrition Follow-up  DOCUMENTATION CODES:   Obesity unspecified  INTERVENTION:   TPN per Pharmacy orders  NUTRITION DIAGNOSIS:   Inadequate oral intake related to altered GI function as evidenced by NPO status. Ongoing.   GOAL:   Patient will meet greater than or equal to 90% of their needs Progressing.   MONITOR:   Diet advancement, Skin, I & O's  REASON FOR ASSESSMENT:   Consult New TPN/TNA  ASSESSMENT:   Pt with pmhx significant for DM, HTN, OSA, and dementia who presents from her nursing home with abdominal distention and respiratory distress.  2/7 S/p exploratory laparotomy with primary ventral hernia repair  2/9 TPN started for prolonged ileus Clinimix E 4.25/25 @ 40 ml/hr x 24 hours with 20% lipids @ 20 ml/hr x 12 hours (1458 kcal, 48 grams protein) Goal TPN: Clinimix E 5/15 @ 80 ml/hr with 20% lipids @ 20 ml/hr x 12 hours to provide: 1842 kcal, 90 grams protein  NG tube to low-intermittent suction (100 ml out today) CBG's: 112 -128-123   Diet Order: NPO  Diet - low sodium heart healthy Diet NPO time specified Except for: Ice Chips TPN (CLINIMIX-E) Adult  Skin:  Wound (see comment) (DTI buttocks)  Last BM:  2/6  Height:   Ht Readings from Last 1 Encounters:  12/22/16 5\' 4"  (1.626 m)    Weight:   Wt Readings from Last 1 Encounters:  12/24/16 219 lb 12.8 oz (99.7 kg)    Ideal Body Weight:  54.5 kg  BMI:  Body mass index is 37.73 kg/m.  Estimated Nutritional Needs:   Kcal:  1750-1950  Protein:  85-95 grams  Fluid:  1.7 - 1.9 L/day  EDUCATION NEEDS:   No education needs identified at this time  Kendell BaneHeather Ysela Hettinger RD, LDN, CNSC [phone] Pager [phone] After Hours Pager

## 2016-12-24 NOTE — Clinical Social Work Note (Signed)
CSW continuing to monitor patient's progress and [facility]. [facility] and [facility] can receive patient whenever ready as patient is a bed hold.  Genelle BalVanessa Ilse Billman, MSW, LCSW Licensed Clinical Social Worker Clinical Social Work Department [facility] [phone]

## 2016-12-24 NOTE — Progress Notes (Signed)
Palliative:  I came to visit and f/u with Ms. Adrienne [name] and family regarding GOC. However, she was working with PT and then having PICC placed (family not at bedside). Will continue to shadow and work to reengage with family for GOC.  Yong ChannelAlicia Estes Lehner, NP Palliative Medicine Team Pager # [phone] (M-F 8a-5p) Team Phone # [phone] (Nights/Weekends)

## 2016-12-24 NOTE — Progress Notes (Signed)
Patient c/o urge to urinate. Bladder scan showed greater than 500 mL. Flushed foley, got return. Notified MD, received verbal order to remove foley and do in and out cath. Night shift nurse made aware.

## 2016-12-24 NOTE — Progress Notes (Signed)
PROGRESS NOTE    Adrienne [name]  ZOX:[id number] DOB: 1936-05-18 DOA: 12/08/2016 PCP: [name], MD   Brief Narrative:  [name] with pmhx significant for DM, HTN, OSA, and dementia who presented from her nursing home with abdominal distention and respiratory distress. Patient was found to have ileus vs SBO, high lactic acid. She was intubated on 1/24 to 1/27. Transferred to [facility] from [facility] on 12/13/2016. Patient improved clinically and repeat x-ray showed improvement with small bowel obstruction. Patient also noted to have diarrhea and had rectal tube placed. C. difficile PCR was negative. Abdominal pain had improved. Patient was placed on a diet which she was tolerating. Patient was to be discharged to the skilled nursing facility on 12/21/2016 however developed abdominal pain with significant emesis and discharge was canceled. KUB done was concerning for gastric outlet obstruction. CT abdomen and pelvis done 12/22/2016 was concerning for bowel strangulation and possible atelectasis and pneumonia. Discharge canceled. Patient kept nothing by mouth NG tube in place. General surgery consulted for further evaluation. Patient placed empirically on IV Zosyn.    Assessment & Plan:   Principal Problem:   Strangulation of intestine (HCC) Active Problems:   Postoperative ileus (HCC)   SBO (small bowel obstruction)   Acute on chronic respiratory failure with hypoxemia (HCC)   Acute renal failure (HCC)   Elevated lactic acid level   Metabolic acidosis   Ileus (HCC)   Ventilator dependent (HCC)   Pressure injury of skin   Oral phase dysphagia   DNR (do not resuscitate) discussion   Palliative care by specialist   Abdominal distention   Leukocytosis   Aspiration pneumonia of left lung (HCC)  #1 incarcerated possible strangulation of ventral hernia with small bowel obstruction/postoperative ileus Patient was to be discharged to skilled nursing facility on 12/21/2016, however while  awaiting transportation patient did have significant emesis with abdominal pain and a such discharge was canceled. NG tube was placed. KUB obtained on 12/21/2016 showed market gaseous dilatation of the stomach, increased compared to prior, outlet obstruction cannot be excluded. Patient was placed on bowel rest CT abdomen and pelvis was subsequently obtained on the morning of 12/22/2016 which showed ventral hernia containing bowel in addition to fluid and omental adipose tissue, there is portal venous gas in both this hernia and intra-abdominal portal veins in the liver concerning for possible bowel necrosis particularly involving the herniated segment. New peri-hepatic ascites. Lactic acid level is elevated at 3.3. Patient with a leukocytosis with a white count of 15.9. Patient status post exploratory laparotomy with repair primary ventral hernia (Dr. Luisa Hart 12/22/2016). Patient kept nothing by mouth. NG tube in place. Continue IV Zosyn.  Patient likely now with a postop ileus. Patient noted to be hard and left chair and not mobile. Patient with no oral intake since admission. General surgery recommended probably starting TNA. Will place a PICC line. Start TNA per pharmacy. General surgery following and appreciate input and recommendations.  #2 probable aspiration pneumonia versus healthcare associated pneumonia CT abdomen and pelvis with mild progressive airspace opacity medially in the left lung likely combination of atelectasis and pneumonia. Patient with significant emesis just prior to discharge and concern for possible early aspiration pneumonia. Blood cultures pending. Sputum Gram stain and culture pending. Urine Legionella antigen negative. Urine pneumococcus antigen negative. WBC trending down. Continue empiric IV Zosyn day #2/7.  #3 HTN Patient with borderline blood pressure with systolic blood pressure in the 90s. Patient also noted with elevated lactic acid. Concern for  bowel strangulation per CT  abdomen and pelvis. Continue to hold antihypertensive medications. Follow.   #4 leukocytosis Likely secondary to problem #1 and 2. WBC trending down. Sputum Gram stain and culture pending. Urine strep pneumococcus antigen negative. Urine Legionella antigen negative. Blood cultures pending. Continue empiric IV Zosyn D3/7.   #5 VDRF Early on in the hospitalization patient had to be intubated from 1/24 - 12/11/2016. Patient extubated around doing fine on nasal cannula. Follow.  #6 acute kidney injury Renal function improving. Avoid nephrotoxins.  #7 hypokalemia Repleted.  #8 acute blood loss anemia/iron deficiency anemia Hemoglobin of 9.3. No overt bleeding. Anemia panel consistent with iron deficiency anemia. Will transfuse IV iron. Follow H&H. Transfusion threshold hemoglobin less than 8.  #8 dementia without behavioral disturbance Stable. Patient when medically stable will discharge to skilled nursing facility. Patient will need palliative care to follow-up in the outpatient setting.    DVT prophylaxis: SCDs Code Status: Full Family Communication: Updated patient. No family at bedside. Disposition Plan: Remain in-house. Likely to skilled nursing facility was bowel started to move an NG tube has been removed and patient tolerating a diet. Per general surgery.   Consultants:   General surgery: Dr.Tsuei 12/08/2016  Wound care  Palliative care Lorinda Creed, NP 12/19/2016  Procedures:   CT abdomen and pelvis 12/08/2006, 12/22/2016  Abdominal films 12/08/2016, 12/09/2016, 12/10/2016, 12/11/2016, 12/15/2016, 12/19/2016, 12/21/2016, 12/22/2016,  Chest x-ray 12/12/2016, 12/10/2016, 12/09/2016, 12/08/2016  Exploratory laparotomy with repair of ventral hernia.  Antimicrobials:  IV Zosyn 12/22/2016     Subjective: Patient sitting up in chair. NG tube in place. Patient with complaints of abdominal pain. No further emesis. No shortness of breath. No chest  pain.  Objective: Vitals:   12/24/16 0700 12/24/16 1035 12/24/16 1428 12/24/16 1726  BP: (!) 133/51 (!) 145/52 (!) 123/51 (!) 143/59  Pulse: 92 92 75 81  Resp: 19   19  Temp:   97.5 F (36.4 C) 98.5 F (36.9 C)  TempSrc:   Oral Oral  SpO2: 98%  100% 97%  Weight:      Height:        Intake/Output Summary (Last 24 hours) at 12/24/16 1808 Last data filed at 12/24/16 1500  Gross per 24 hour  Intake              210 ml  Output              940 ml  Net             -730 ml   Filed Weights   12/22/16 2120 12/23/16 2101 12/24/16 0137  Weight: 99.8 kg (220 lb) 99.7 kg (219 lb 12.8 oz) 99.7 kg (219 lb 12.8 oz)    Examination:  General exam: NGT in place.  Respiratory system: Clear to auscultation Anterior lung fields. Respiratory effort normal. Cardiovascular system: S1 & S2 heard, RRR. No JVD, murmurs, rubs, gallops or clicks. No pedal edema. Gastrointestinal system: Abdomen is soft and hypoactive bowel sounds.Distended. Abdominal binder in place. Central nervous system: Alert and oriented. No focal neurological deficits. Extremities: Symmetric 5 x 5 power. Skin: No rashes, lesions or ulcers Psychiatry: Judgement and insight appear normal. Mood & affect appropriate.     Data Reviewed: I have personally reviewed following labs and imaging studies  CBC:  Recent Labs Lab 12/19/16 0435 12/20/16 0617 12/22/16 0141 12/23/16 0613 12/24/16 0518  WBC 13.5* 11.9* 15.9* 7.4 8.2  NEUTROABS  --   --  13.6* 4.6  --   HGB  10.5* 10.4* 11.8* 9.4* 9.3*  HCT 33.1* 33.0* 37.4 30.2* 30.5*  MCV 97.4 98.5 98.9 101.0* 102.3*  PLT 292 252 329 262 245   Basic Metabolic Panel:  Recent Labs Lab 12/19/16 0435 12/20/16 0617 12/22/16 0141 12/23/16 0751 12/24/16 0518  NA 139 138 138 144 145  K 3.3* 3.6 3.7 4.2 3.7  CL 109 112* 110 114* 114*  CO2 22 20* 18* 18* 17*  GLUCOSE 148* 156* 154* 147* 124*  BUN 7 7 9  21* 22*  CREATININE 0.63 0.58 0.91 1.61* 0.94  CALCIUM 8.5* 8.4* 8.7* 7.9*  8.3*  MG  --   --   --  1.2* 2.0  PHOS 3.6  --   --   --   --    GFR: Estimated Creatinine Clearance: 56.6 mL/min (by C-G formula based on SCr of 0.94 mg/dL). Liver Function Tests:  Recent Labs Lab 12/19/16 0435 12/22/16 0141 12/23/16 0751  AST  --  37 22  ALT  --  22 17  ALKPHOS  --  59 41  BILITOT  --  0.8 1.1  PROT  --  6.9 5.9*  ALBUMIN 2.5* 2.7* 2.3*   No results for input(s): LIPASE, AMYLASE in the last 168 hours. No results for input(s): AMMONIA in the last 168 hours. Coagulation Profile: No results for input(s): INR, PROTIME in the last 168 hours. Cardiac Enzymes: No results for input(s): CKTOTAL, CKMB, CKMBINDEX, TROPONINI in the last 168 hours. BNP (last 3 results) No results for input(s): PROBNP in the last 8760 hours. HbA1C: No results for input(s): HGBA1C in the last 72 hours. CBG:  Recent Labs Lab 12/23/16 2353 12/24/16 0348 12/24/16 0754 12/24/16 1125 12/24/16 1641  GLUCAP 127* 112* 128* 123* 112*   Lipid Profile: No results for input(s): CHOL, HDL, LDLCALC, TRIG, CHOLHDL, LDLDIRECT in the last 72 hours. Thyroid Function Tests: No results for input(s): TSH, T4TOTAL, FREET4, T3FREE, THYROIDAB in the last 72 hours. Anemia Panel:  Recent Labs  12/23/16 0920 12/23/16 1200  VITAMINB12  --  1,323*  FOLATE  --  27.0  FERRITIN 212  --   TIBC 209*  --   IRON 9*  --    Sepsis Labs:  Recent Labs Lab 12/22/16 0141 12/22/16 0623 12/22/16 0806 12/23/16 0920  LATICACIDVEN 3.1* 3.3* 3.3* 1.8    Recent Results (from the past 240 hour(s))  Culture, blood (routine x 2) Call MD if unable to obtain prior to antibiotics being given     Status: None (Preliminary result)   Collection Time: 12/22/16 11:13 AM  Result Value Ref Range Status   Specimen Description BLOOD LEFT HAND  Final   Special Requests IN PEDIATRIC BOTTLE 1CC  Final   Culture NO GROWTH 2 DAYS  Final   Report Status PENDING  Incomplete  Culture, blood (routine x 2) Call MD if unable to  obtain prior to antibiotics being given     Status: None (Preliminary result)   Collection Time: 12/22/16 11:24 AM  Result Value Ref Range Status   Specimen Description BLOOD LEFT HAND  Final   Special Requests IN PEDIATRIC BOTTLE 3CC  Final   Culture NO GROWTH 2 DAYS  Final   Report Status PENDING  Incomplete         Radiology Studies: Dg Chest Port 1 View  Result Date: 12/23/2016 CLINICAL DATA:  Rhonchi EXAM: PORTABLE CHEST 1 VIEW COMPARISON:  December 12, 2016 FINDINGS: Nasogastric tube tip and side port are below the diaphragm. No pneumothorax. There  is airspace consolidation in the medial left base. Lungs elsewhere are clear. Heart is mildly enlarged with pulmonary vascularity within normal limits. No adenopathy. No bone lesions. IMPRESSION: Medial left base airspace consolidation. Lungs elsewhere clear. Stable cardiac prominence. Nasogastric tube tip and side port below the diaphragm. Electronically Signed   By: Bretta Bang III M.D.   On: 12/23/2016 09:57        Scheduled Meds: . enoxaparin (LOVENOX) injection  40 mg Subcutaneous Q24H  . famotidine  20 mg Per Tube BID  . insulin aspart  0-9 Units Subcutaneous Q4H  . ipratropium-albuterol  3 mL Nebulization TID  . mouth rinse  15 mL Mouth Rinse BID  . metoprolol tartrate  12.5 mg Per Tube BID  . multivitamin  15 mL Per Tube Daily  . piperacillin-tazobactam (ZOSYN)  IV  3.375 g Intravenous Q8H  . potassium chloride  40 mEq Per Tube Daily   Continuous Infusions: . dextrose 5 % and 0.45% NaCl 50 mL/hr at 12/24/16 1631  . Marland KitchenTPN (CLINIMIX-E) Adult 40 mL/hr at 12/24/16 1716   And  . fat emulsion 240 mL (12/24/16 1716)     LOS: 16 days    Time spent: 35 mins    THOMPSON,DANIEL, MD [facility] Pager [phone]  If 7PM-7AM, please contact night-coverage [email or web address] Password [email or web address] 12/24/2016, 6:08 PM

## 2016-12-24 NOTE — Progress Notes (Signed)
2 Days Post-Op  Subjective: Not making much progress.  Distended, rare BS, NG sump blocked off.  SBO dates back to At least 12/08/16 when she was admitted.     Objective: Vital signs in last 24 hours: Temp:  [98.2 F (36.8 C)-98.7 F (37.1 C)] 98.5 F (36.9 C) (02/09 0357) Pulse Rate:  [87-101] 92 (02/09 0700) Resp:  [18-20] 19 (02/09 0700) BP: (120-133)/(46-52) 133/51 (02/09 0700) SpO2:  [94 %-98 %] 98 % (02/09 0700) Weight:  [99.7 kg (219 lb 12.8 oz)] 99.7 kg (219 lb 12.8 oz) (02/09 0137) Last BM Date: 12/21/16 NPO IV 847 listed Urine 675 NG 450 Drain 50 Afebrile, VSS WBC pending,   Intake/Output from previous day: 02/08 0701 - 02/09 0700 In: 1077.9 [I.V.:847.9; NG/GT:30; IV Piggyback:200] Out: 1175 [Urine:675; Emesis/NG output:450; Drains:50] Intake/Output this shift: No intake/output data recorded.  General appearance: alert, cooperative, no distress and RR is up Resp: clear to auscultation bilaterally and anterior GI: distended, almost no BS, incision is ok.  drain is old blood, no flatus, NG not working well  Lab Results:   Recent Labs  12/23/16 0613 12/24/16 0518  WBC 7.4 PENDING  HGB 9.4* 9.3*  HCT 30.2* 30.5*  PLT 262 245    BMET  Recent Labs  12/23/16 0751 12/24/16 0518  NA 144 145  K 4.2 3.7  CL 114* 114*  CO2 18* 17*  GLUCOSE 147* 124*  BUN 21* 22*  CREATININE 1.61* 0.94  CALCIUM 7.9* 8.3*   PT/INR No results for input(s): LABPROT, INR in the last 72 hours.   Recent Labs Lab 12/17/16 1042 12/19/16 0435 12/22/16 0141 12/23/16 0751  AST  --   --  37 22  ALT  --   --  22 17  ALKPHOS  --   --  59 41  BILITOT  --   --  0.8 1.1  PROT  --   --  6.9 5.9*  ALBUMIN 2.6* 2.5* 2.7* 2.3*     Lipase     Component Value Date/Time   LIPASE 61 (H) 12/08/2016 0922     Studies/Results: Dg Chest Port 1 View  Result Date: 12/23/2016 CLINICAL DATA:  Rhonchi EXAM: PORTABLE CHEST 1 VIEW COMPARISON:  December 12, 2016 FINDINGS: Nasogastric  tube tip and side port are below the diaphragm. No pneumothorax. There is airspace consolidation in the medial left base. Lungs elsewhere are clear. Heart is mildly enlarged with pulmonary vascularity within normal limits. No adenopathy. No bone lesions. IMPRESSION: Medial left base airspace consolidation. Lungs elsewhere clear. Stable cardiac prominence. Nasogastric tube tip and side port below the diaphragm. Electronically Signed   By: Bretta Bang III M.D.   On: 12/23/2016 09:57    Medications: . enoxaparin (LOVENOX) injection  40 mg Subcutaneous Q24H  . famotidine  20 mg Per Tube BID  . ferumoxytol  510 mg Intravenous Once  . insulin aspart  0-9 Units Subcutaneous Q4H  . ipratropium-albuterol  3 mL Nebulization TID  . mouth rinse  15 mL Mouth Rinse BID  . metoprolol tartrate  12.5 mg Per Tube BID  . piperacillin-tazobactam (ZOSYN)  IV  3.375 g Intravenous Q8H  . potassium chloride  40 mEq Per Tube Daily   . dextrose 5 % and 0.45% NaCl      Assessment/Plan Incarcerated possible strangulated ventral hernia with small bowel obstruction S/p exploratory laparotomy with primary ventral hernia repair, 12/22/16, DR. Cornett Post op ileus Probable aspiration pneumonia VDRF 1/24-27/2018 Acute kidney injury Anemia Dementia Malnutrition  FEN:  IV fluids/NPO ID: Zosyn 12/22/16 =>> day 3 DVT:  SCD/Lovenox  Plan:  She is a hoyer lift to chair, not mobile, she has not had PO intake for some time.  I will defer to Medicine, but consider TNA while we wait for Bowel function to resume.         LOS: 16 days    Adrienne [name] 12/24/2016 [id number]

## 2016-12-24 NOTE — Progress Notes (Signed)
PHARMACY - ADULT TOTAL PARENTERAL NUTRITION CONSULT NOTE   Pharmacy Consult for TPN Indication: prolonged ileus Patient Measurements: Height: 5\' 4"  (162.6 cm) Weight: 219 lb 12.8 oz (99.7 kg) IBW/kg (Calculated) : 54.7 TPN AdjBW (KG): 66 Body mass index is 37.73 kg/m.   Assessment: [name] admitted on 12/08/16.  POD #2. Pharmacy consulted to dose TPN for prolonged ileus.  GI: Incarcerated possible strangulated ventral hernia with small bowel obstruction S/p exploratory laparotomy with primary ventral hernia repair, 12/22/16, DR. Cornett Post op ileus; NGO 950/24 hrs, 100 overnight, last BM 2/6; pepcid per tube Endo: DM PTA (on metformin PTA), on SSI Insulin requirements in the past 24 hours: 5 Lytes:K 3.7 (goal >= 4 for ileus) has 40 per tube daily;  mag 12.>2 after 4 gm given yesterday Renal:AKI, has D545 at 100 ml/hr Pulm:OSA, VDRF 1/24- 12/22/16 Cards: HTN, on metop 12.5 per tube bid Hepatobil:alb 2.3, LFTs WNL, Trig 166 on 1/26 - on fenofibrate PTA Neuro:dementia NW:GNFAOZHY:possible asp PNA Zosyn 2/7>>  Best Practices: TPN Access: PICC ordered for TPN 2/9 TPN start date:  Nutritional Goals (per RD recommendation on 2/5): KCal: 1750-1950 Protein: 85 - 95  Current Nutrition: NPO x ic chips  Plan:  start Clinimix E 4.25/25 at 40 ml/hr start 20% lipid emulsion at 20 ml/hr - infuse over 12 hours Goal TPN is Clinimix E 5/15 at 80 ml/hr plus lipids to provide 90 gm protein and 1842 kcals for 100% support  IVMF D545 at 100 ml/hr- decrease to 50 ml/hr once TPN started to keep IVF ~ 100 ml/hr or per MD orders  This provides 41 g of protein and 1458 kCals per day meeting 48 % of protein and 83% of kCal needs  MVI per tube KCL 20 per tube in addition to scheduled 40 qday Continue current SSI and adjust as needed Monitor TPN labs F/U resolution of ileus  Adrienne [name], Adrienne [name] T 12/24/2016,12:44 PM

## 2016-12-25 DIAGNOSIS — R140 Abdominal distension (gaseous): Secondary | ICD-10-CM

## 2016-12-25 LAB — GLUCOSE, CAPILLARY
GLUCOSE-CAPILLARY: 136 mg/dL — AB (ref 65–99)
GLUCOSE-CAPILLARY: 156 mg/dL — AB (ref 65–99)
GLUCOSE-CAPILLARY: 118 mg/dL — AB (ref 65–99)
Glucose-Capillary: 134 mg/dL — ABNORMAL HIGH (ref 65–99)
Glucose-Capillary: 133 mg/dL — ABNORMAL HIGH (ref 65–99)

## 2016-12-25 LAB — DIFFERENTIAL
Basophils Absolute: 0 10*3/uL (ref 0.0–0.1)
Basophils Relative: 1 %
EOS PCT: 3 %
Eosinophils Absolute: 0.2 10*3/uL (ref 0.0–0.7)
LYMPHS ABS: 2.1 10*3/uL (ref 0.7–4.0)
LYMPHS PCT: 31 %
MONO ABS: 0.4 10*3/uL (ref 0.1–1.0)
Monocytes Relative: 7 %
NEUTROS ABS: 3.9 10*3/uL (ref 1.7–7.7)
NEUTROS PCT: 58 %

## 2016-12-25 LAB — CBC
HCT: 28.2 % — ABNORMAL LOW (ref 36.0–46.0)
HEMOGLOBIN: 8.5 g/dL — AB (ref 12.0–15.0)
MCH: 30.9 pg (ref 26.0–34.0)
MCHC: 30.1 g/dL (ref 30.0–36.0)
MCV: 102.5 fL — AB (ref 78.0–100.0)
Platelets: 219 10*3/uL (ref 150–400)
RBC: 2.75 MIL/uL — AB (ref 3.87–5.11)
RDW: 16.3 % — ABNORMAL HIGH (ref 11.5–15.5)
WBC: 6.6 10*3/uL (ref 4.0–10.5)

## 2016-12-25 LAB — PREALBUMIN: Prealbumin: 6 mg/dL — ABNORMAL LOW (ref 18–38)

## 2016-12-25 LAB — COMPREHENSIVE METABOLIC PANEL
ALBUMIN: 1.9 g/dL — AB (ref 3.5–5.0)
ALK PHOS: 43 U/L (ref 38–126)
ALT: 14 U/L (ref 14–54)
AST: 22 U/L (ref 15–41)
Anion gap: 9 (ref 5–15)
BUN: 13 mg/dL (ref 6–20)
CALCIUM: 8.4 mg/dL — AB (ref 8.9–10.3)
CHLORIDE: 113 mmol/L — AB (ref 101–111)
CO2: 23 mmol/L (ref 22–32)
CREATININE: 0.7 mg/dL (ref 0.44–1.00)
GFR calc Af Amer: >60 mL/min (ref >60)
GFR calc non Af Amer: >60 mL/min (ref >60)
GLUCOSE: 148 mg/dL — AB (ref 65–99)
Potassium: 3.7 mmol/L (ref 3.5–5.1)
SODIUM: 145 mmol/L (ref 135–145)
Total Bilirubin: 0.7 mg/dL (ref 0.3–1.2)
Total Protein: 5.7 g/dL — ABNORMAL LOW (ref 6.5–8.1)

## 2016-12-25 LAB — MAGNESIUM: Magnesium: 1.6 mg/dL — ABNORMAL LOW (ref 1.7–2.4)

## 2016-12-25 LAB — TRIGLYCERIDES: Triglycerides: 146 mg/dL (ref <150)

## 2016-12-25 LAB — PHOSPHORUS: Phosphorus: 2.2 mg/dL — ABNORMAL LOW (ref 2.5–4.6)

## 2016-12-25 LAB — URINE CULTURE: Culture: NO GROWTH

## 2016-12-25 MED ORDER — POTASSIUM CHLORIDE 20 MEQ/15ML (10%) PO SOLN: 20.0000 meq | Freq: Once | ORAL | Status: DC

## 2016-12-25 MED ORDER — MAGNESIUM SULFATE 4 GM/100ML IV SOLN
4.0000 g | Freq: Once | INTRAVENOUS | Status: AC
  Administered 2016-12-25: 4 g via INTRAVENOUS
  Filled 2016-12-25: qty 100

## 2016-12-25 MED ORDER — MAGNESIUM SULFATE 2 GM/50ML IV SOLN
2.0000 g | Freq: Once | INTRAVENOUS | Status: DC
  Filled 2016-12-25: qty 50

## 2016-12-25 MED ORDER — POTASSIUM PHOSPHATES 15 MMOLE/5ML IV SOLN
15.0000 mmol | Freq: Once | INTRAVENOUS | Status: AC
  Administered 2016-12-25: 15 mmol via INTRAVENOUS
  Filled 2016-12-25: qty 5

## 2016-12-25 MED ORDER — CLINIMIX E/DEXTROSE (4.25/25) 4.25 % IV SOLN
INTRAVENOUS | Status: AC
  Administered 2016-12-25: 18:00:00 via INTRAVENOUS
  Filled 2016-12-25: qty 960

## 2016-12-25 MED ORDER — FAT EMULSION 20 % IV EMUL
240.0000 mL | INTRAVENOUS | Status: AC
  Administered 2016-12-25: 240 mL via INTRAVENOUS
  Filled 2016-12-25: qty 250

## 2016-12-25 NOTE — Progress Notes (Signed)
[facility] Progress Note  3 Days Post-Op  Subjective: C/o abdominal soreness. Unable to report if passing gas. Denies BM.  - drain: 65 cc/24 h, serosanguinous - NGT: 180 cc/24 h bilious VSS  Objective: Vital signs in last 24 hours: Temp:  [97.5 F (36.4 C)-98.7 F (37.1 C)] 97.6 F (36.4 C) (02/10 0424) Pulse Rate:  [75-92] 78 (02/10 0424) Resp:  [19-20] 20 (02/10 0424) BP: (123-145)/(51-63) 135/54 (02/10 0424) SpO2:  [97 %-100 %] 100 % (02/10 0424) Weight:  [99.6 kg (219 lb 9.3 oz)] 99.6 kg (219 lb 9.3 oz) (02/10 0456) Last BM Date: 12/21/16  Intake/Output from previous day: 02/09 0701 - 02/10 0700 In: 1488.2 [I.V.:1108.2; NG/GT:180; IV Piggyback:200] Out: 790 [Urine:675; Emesis/NG output:50; Drains:65] Intake/Output this shift: No intake/output data recorded.  PE: Gen:  Alert, NAD, pleasant HEENT: [place] on 2L Pulm:  Normal effort, decreased breath sounds bilateral lung bases Abd: Soft, appropriately tender, bowel sounds present in all 4 quadrants, distended, incision c/d/i under honeycomb, drain in place with minimal serosanguinous output  Lab Results:   Recent Labs  12/24/16 0518 12/25/16 0414  WBC 8.2 6.6  HGB 9.3* 8.5*  HCT 30.5* 28.2*  PLT 245 219   BMET  Recent Labs  12/24/16 0518 12/25/16 0414  NA 145 145  K 3.7 3.7  CL 114* 113*  CO2 17* 23  GLUCOSE 124* 148*  BUN 22* 13  CREATININE 0.94 0.70  CALCIUM 8.3* 8.4*   PT/INR No results for input(s): LABPROT, INR in the last 72 hours. CMP     Component Value Date/Time   NA 145 12/25/2016 0414   K 3.7 12/25/2016 0414   CL 113 (H) 12/25/2016 0414   CO2 23 12/25/2016 0414   GLUCOSE 148 (H) 12/25/2016 0414   BUN 13 12/25/2016 0414   CREATININE 0.70 12/25/2016 0414   CALCIUM 8.4 (L) 12/25/2016 0414   PROT 5.7 (L) 12/25/2016 0414   ALBUMIN 1.9 (L) 12/25/2016 0414   AST 22 12/25/2016 0414   ALT 14 12/25/2016 0414   ALKPHOS 43 12/25/2016 0414   BILITOT 0.7 12/25/2016 0414   GFRNONAA >60 12/25/2016 0414   GFRAA >60 12/25/2016 0414   Lipase     Component Value Date/Time   LIPASE 61 (H) 12/08/2016 0922   Studies/Results: Dg Chest Port 1 View  Result Date: 12/23/2016 CLINICAL DATA:  Rhonchi EXAM: PORTABLE CHEST 1 VIEW COMPARISON:  December 12, 2016 FINDINGS: Nasogastric tube tip and side port are below the diaphragm. No pneumothorax. There is airspace consolidation in the medial left base. Lungs elsewhere are clear. Heart is mildly enlarged with pulmonary vascularity within normal limits. No adenopathy. No bone lesions. IMPRESSION: Medial left base airspace consolidation. Lungs elsewhere clear. Stable cardiac prominence. Nasogastric tube tip and side port below the diaphragm. Electronically Signed   By: Bretta BangWilliam  Woodruff III M.D.   On: 12/23/2016 09:57    Anti-infectives: Anti-infectives    Start     Dose/Rate Route Frequency Ordered Stop   12/22/16 1100  piperacillin-tazobactam (ZOSYN) IVPB 3.375 g     3.375 g 12.5 mL/hr over 240 Minutes Intravenous Every 8 hours 12/22/16 1026     12/08/16 1200  piperacillin-tazobactam (ZOSYN) IVPB 3.375 g  Status:  Discontinued     3.375 g 12.5 mL/hr over 240 Minutes Intravenous Every 8 hours 12/08/16 0717 12/11/16 0846   12/08/16 0345  vancomycin (VANCOCIN) IVPB 1000 mg/200 mL premix     1,000 mg 200 mL/hr over 60 Minutes Intravenous  Once 12/08/16  1610 12/08/16 0740   12/08/16 0345  piperacillin-tazobactam (ZOSYN) IVPB 3.375 g     3.375 g 100 mL/hr over 30 Minutes Intravenous  Once 12/08/16 9604 12/08/16 0741       Assessment/Plan Incarcerated possible strangulated ventral hernia with small bowel obstruction S/p exploratory laparotomy with primary ventral hernia repair, 12/22/16, DR. Cornett; POD#3 - Post op ileus, await return of bowel function - NGT to LIWS - pain control - IS  - PT/OT   Probable aspiration pneumonia VDRF 1/24-27/2018 Acute kidney injury Anemia Dementia Malnutrition - now on TPN  FEN:  IV  fluids/NPO/TPN ID: Zosyn 12/22/16 >> (day #3/7) DVT:  SCD/Lovenox  Plan: bowel sounds improving. Await return of bowel function, continue NGT to LIWS, continue TPN.  Pain control and IS    LOS: 17 days    Adam Phenix , [facility] 12/25/2016, 7:45 AM Pager: [phone] Consults: [phone] Mon-Fri 7:00 am-4:30 pm Sat-Sun 7:00 am-11:30 am

## 2016-12-25 NOTE — Progress Notes (Signed)
PROGRESS NOTE    Adrienne [name]  NFA:[id number] DOB: 1935-11-22 DOA: 12/08/2016 PCP: [name], MD   Brief Narrative:  [name] with pmhx significant for DM, HTN, OSA, and dementia who presented from her nursing home with abdominal distention and respiratory distress. Patient was found to have ileus vs SBO, high lactic acid. She was intubated on 1/24 to 1/27. Transferred to [facility] from [facility] on 12/13/2016. Patient improved clinically and repeat x-ray showed improvement with small bowel obstruction. Patient also noted to have diarrhea and had rectal tube placed. C. difficile PCR was negative. Abdominal pain had improved. Patient was placed on a diet which she was tolerating. Patient was to be discharged to the skilled nursing facility on 12/21/2016 however developed abdominal pain with significant emesis and discharge was canceled. KUB done was concerning for gastric outlet obstruction. CT abdomen and pelvis done 12/22/2016 was concerning for bowel strangulation and possible atelectasis and pneumonia. Discharge canceled. Patient kept nothing by mouth NG tube in place. General surgery consulted for further evaluation. Patient placed empirically on IV Zosyn.    Assessment & Plan:   Principal Problem:   Strangulation of intestine (HCC) Active Problems:   Postoperative ileus (HCC)   SBO (small bowel obstruction)   Acute on chronic respiratory failure with hypoxemia (HCC)   Acute renal failure (HCC)   Elevated lactic acid level   Metabolic acidosis   Ileus (HCC)   Ventilator dependent (HCC)   Pressure injury of skin   Oral phase dysphagia   DNR (do not resuscitate) discussion   Palliative care by specialist   Abdominal distention   Leukocytosis   Aspiration pneumonia of left lung (HCC)  #1 incarcerated possible strangulation of ventral hernia with small bowel obstruction/postoperative ileus Patient was to be discharged to skilled nursing facility on 12/21/2016, however while  awaiting transportation patient did have significant emesis with abdominal pain and a such discharge was canceled. NG tube was placed. KUB obtained on 12/21/2016 showed market gaseous dilatation of the stomach, increased compared to prior, outlet obstruction cannot be excluded. Patient was placed on bowel rest CT abdomen and pelvis was subsequently obtained on the morning of 12/22/2016 which showed ventral hernia containing bowel in addition to fluid and omental adipose tissue, there is portal venous gas in both this hernia and intra-abdominal portal veins in the liver concerning for possible bowel necrosis particularly involving the herniated segment. New peri-hepatic ascites. Lactic acid level is elevated at 3.3. Patient with a leukocytosis with a white count of 15.9. Patient status post exploratory laparotomy with repair primary ventral hernia (Dr. Luisa Hart 12/22/2016). Patient kept nothing by mouth. NG tube in place. Continue IV Zosyn.  Patient likely now with a postop ileus. Patient noted to be hard and left chair and not mobile. Patient with no oral intake since admission. PICC line placed and patient started on TNA 12/24/2016. General surgery following and appreciate input and recommendations.  #2 probable aspiration pneumonia versus healthcare associated pneumonia CT abdomen and pelvis with mild progressive airspace opacity medially in the left lung likely combination of atelectasis and pneumonia. Patient with significant emesis just prior to discharge and concern for possible early aspiration pneumonia. Blood cultures pending. Sputum Gram stain and culture pending. Urine Legionella antigen negative. Urine pneumococcus antigen negative. WBC trending down. Continue empiric IV Zosyn day #3/7.  #3 HTN Patient with borderline blood pressure with systolic blood pressure in the 90s. Patient also noted with elevated lactic acid. Concern for bowel strangulation per CT abdomen and  pelvis. Patient status post  exploratory laparotomy with ventral hernia repair. Blood pressure improved with hydration. Continue to hold antihypertensive medications. Follow.   #4 leukocytosis Likely secondary to problem #1 and 2. WBC trending down. Sputum Gram stain and culture pending. Urine strep pneumococcus antigen negative. Urine Legionella antigen negative. Blood cultures pending. Continue empiric IV Zosyn D3/7.   #5 VDRF Early on in the hospitalization patient had to be intubated from 1/24 - 12/11/2016. Patient extubated around doing fine on nasal cannula. Follow.  #6 acute kidney injury Renal function improved. Avoid nephrotoxins.  #7 hypokalemia/hypomagnesemia Replete.  #8 acute blood loss anemia/iron deficiency anemia Hemoglobin of 8.5. Likely dilutional effect. No overt bleeding. Anemia panel consistent with iron deficiency anemia. s/p IV iron. Follow H&H. Transfusion threshold hemoglobin less than 8.  #8 dementia without behavioral disturbance Stable. Patient when medically stable will discharge to skilled nursing facility. Patient will need palliative care to follow-up in the outpatient setting.    DVT prophylaxis: SCDs Code Status: Full Family Communication: Updated patient. No family at bedside. Disposition Plan: Remain in-house. Likely to skilled nursing facility was bowel started to move an NG tube has been removed and patient tolerating a diet and off TNA. Per general surgery.   Consultants:   General surgery: Dr.Tsuei 12/08/2016  Wound care  Palliative care Lorinda Creed, NP 12/19/2016  Procedures:   CT abdomen and pelvis 12/08/2006, 12/22/2016  Abdominal films 12/08/2016, 12/09/2016, 12/10/2016, 12/11/2016, 12/15/2016, 12/19/2016, 12/21/2016, 12/22/2016,  Chest x-ray 12/12/2016, 12/10/2016, 12/09/2016, 12/08/2016  Exploratory laparotomy with repair of ventral hernia.  Antimicrobials:  IV Zosyn 12/22/2016     Subjective: Patient sitting up in bed. NG tube in place. Patient  with complaints of abdominal pain. No further emesis. No shortness of breath. No chest pain. Patient denies any bowel movement. Patient asking for ice chips.  Objective: Vitals:   12/24/16 2028 12/25/16 0424 12/25/16 0456 12/25/16 0857  BP: 136/63 (!) 135/54  (!) 145/53  Pulse: 75 78  80  Resp: 20 20  20   Temp: 98.7 F (37.1 C) 97.6 F (36.4 C)  97.8 F (36.6 C)  TempSrc: Oral Oral  Oral  SpO2: 98% 100%  100%  Weight: 99.6 kg (219 lb 9.3 oz)  99.6 kg (219 lb 9.3 oz)   Height:        Intake/Output Summary (Last 24 hours) at 12/25/16 1352 Last data filed at 12/25/16 1316  Gross per 24 hour  Intake          1308.17 ml  Output              625 ml  Net           683.17 ml   Filed Weights   12/24/16 0137 12/24/16 2028 12/25/16 0456  Weight: 99.7 kg (219 lb 12.8 oz) 99.6 kg (219 lb 9.3 oz) 99.6 kg (219 lb 9.3 oz)    Examination:  General exam: NGT in place.  Respiratory system: Clear to auscultation Anterior lung fields. Respiratory effort normal. Cardiovascular system: S1 & S2 heard, RRR. No JVD, murmurs, rubs, gallops or clicks. No pedal edema. Gastrointestinal system: Abdomen is soft and hypoactive bowel sounds.Less Distended. Abdominal binder in place. Central nervous system: Alert and oriented. No focal neurological deficits. Extremities: Symmetric 5 x 5 power. Skin: No rashes, lesions or ulcers Psychiatry: Judgement and insight appear normal. Mood & affect appropriate.     Data Reviewed: I have personally reviewed following labs and imaging studies  CBC:  Recent Labs Lab 12/20/16  [id number] 12/22/16 0141 12/23/16 [id number] 12/24/16 0518 12/25/16 0414  WBC 11.9* 15.9* 7.4 8.2 6.6  NEUTROABS  --  13.6* 4.6  --  3.9  HGB 10.4* 11.8* 9.4* 9.3* 8.5*  HCT 33.0* 37.4 30.2* 30.5* 28.2*  MCV 98.5 98.9 101.0* 102.3* 102.5*  PLT 252 329 262 245 219   Basic Metabolic Panel:  Recent Labs Lab 12/19/16 0435 12/20/16 0617 12/22/16 0141 12/23/16 0751 12/24/16 0518  12/25/16 0414  NA 139 138 138 144 145 145  K 3.3* 3.6 3.7 4.2 3.7 3.7  CL 109 112* 110 114* 114* 113*  CO2 22 20* 18* 18* 17* 23  GLUCOSE 148* 156* 154* 147* 124* 148*  BUN 7 7 9  21* 22* 13  CREATININE 0.63 0.58 0.91 1.61* 0.94 0.70  CALCIUM 8.5* 8.4* 8.7* 7.9* 8.3* 8.4*  MG  --   --   --  1.2* 2.0 1.6*  PHOS 3.6  --   --   --   --  2.2*   GFR: Estimated Creatinine Clearance: 66.5 mL/min (by C-G formula based on SCr of 0.7 mg/dL). Liver Function Tests:  Recent Labs Lab 12/19/16 0435 12/22/16 0141 12/23/16 0751 12/25/16 0414  AST  --  37 22 22  ALT  --  22 17 14   ALKPHOS  --  59 41 43  BILITOT  --  0.8 1.1 0.7  PROT  --  6.9 5.9* 5.7*  ALBUMIN 2.5* 2.7* 2.3* 1.9*   No results for input(s): LIPASE, AMYLASE in the last 168 hours. No results for input(s): AMMONIA in the last 168 hours. Coagulation Profile: No results for input(s): INR, PROTIME in the last 168 hours. Cardiac Enzymes: No results for input(s): CKTOTAL, CKMB, CKMBINDEX, TROPONINI in the last 168 hours. BNP (last 3 results) No results for input(s): PROBNP in the last 8760 hours. HbA1C: No results for input(s): HGBA1C in the last 72 hours. CBG:  Recent Labs Lab 12/24/16 1959 12/24/16 2359 12/25/16 0420 12/25/16 0824 12/25/16 1232  GLUCAP 164* 145* 136* 134* 156*   Lipid Profile:  Recent Labs  12/25/16 0414  TRIG 146   Thyroid Function Tests: No results for input(s): TSH, T4TOTAL, FREET4, T3FREE, THYROIDAB in the last 72 hours. Anemia Panel:  Recent Labs  12/23/16 0920 12/23/16 1200  VITAMINB12  --  1,323*  FOLATE  --  27.0  FERRITIN 212  --   TIBC 209*  --   IRON 9*  --    Sepsis Labs:  Recent Labs Lab 12/22/16 0141 12/22/16 0623 12/22/16 0806 12/23/16 0920  LATICACIDVEN 3.1* 3.3* 3.3* 1.8    Recent Results (from the past 240 hour(s))  Culture, blood (routine x 2) Call MD if unable to obtain prior to antibiotics being given     Status: None (Preliminary result)   Collection  Time: 12/22/16 11:13 AM  Result Value Ref Range Status   Specimen Description BLOOD LEFT HAND  Final   Special Requests IN PEDIATRIC BOTTLE 1CC  Final   Culture NO GROWTH 3 DAYS  Final   Report Status PENDING  Incomplete  Culture, blood (routine x 2) Call MD if unable to obtain prior to antibiotics being given     Status: None (Preliminary result)   Collection Time: 12/22/16 11:24 AM  Result Value Ref Range Status   Specimen Description BLOOD LEFT HAND  Final   Special Requests IN PEDIATRIC BOTTLE 3CC  Final   Culture NO GROWTH 3 DAYS  Final   Report Status PENDING  Incomplete  Urine  culture     Status: None   Collection Time: 12/23/16 11:41 PM  Result Value Ref Range Status   Specimen Description URINE, RANDOM  Final   Special Requests NONE  Final   Culture NO GROWTH  Final   Report Status 12/25/2016 FINAL  Final         Radiology Studies: No results found.      Scheduled Meds: . enoxaparin (LOVENOX) injection  40 mg Subcutaneous Q24H  . famotidine  20 mg Per Tube BID  . insulin aspart  0-9 Units Subcutaneous Q4H  . mouth rinse  15 mL Mouth Rinse BID  . metoprolol tartrate  12.5 mg Per Tube BID  . multivitamin  15 mL Per Tube Daily  . piperacillin-tazobactam (ZOSYN)  IV  3.375 g Intravenous Q8H  . potassium chloride  40 mEq Per Tube Daily  . potassium phosphate IVPB (mmol)  15 mmol Intravenous Once   Continuous Infusions: . dextrose 5 % and 0.45% NaCl 50 mL/hr at 12/24/16 1631  . Marland KitchenTPN (CLINIMIX-E) Adult     And  . fat emulsion    . Marland KitchenTPN (CLINIMIX-E) Adult 40 mL/hr at 12/24/16 1716     LOS: 17 days    Time spent: 35 mins    Kristel Durkee, MD [facility] Pager [phone]  If 7PM-7AM, please contact night-coverage [email or web address] Password [email or web address] 12/25/2016, 1:52 PM

## 2016-12-25 NOTE — Progress Notes (Signed)
PHARMACY - ADULT TOTAL PARENTERAL NUTRITION CONSULT NOTE   Pharmacy Consult for TPN Indication: prolonged ileus Patient Measurements: Height: 5\' 4"  (162.6 cm) Weight: 219 lb 9.3 oz (99.6 kg) IBW/kg (Calculated) : 54.7 TPN AdjBW (KG): 66 Body mass index is 37.69 kg/m.   Assessment: [name] admitted on 12/08/16.  POD #3. Pharmacy consulted to dose TPN for prolonged ileus.  GI: Incarcerated possible strangulated ventral hernia with small bowel obstruction S/p exploratory laparotomy with primary ventral hernia repair, 12/22/16, DR. Cornett Post op ileus; NGO down to 100 from  950/24 hrs, 50 overnight, last BM 2/6; pepcid per tube Endo: DM PTA (on metformin PTA), on SSI, CBGs 136 - 164 after TPN started Insulin requirements in the past 24 hours: 4 Lytes:K 3.7 after 60 per tube given yesterday (goal >= 4 for ileus) has 40 per tube daily ordered per MD;  mag 1.6 (goal 2 for > for ileus) phos 2.2, CorCa 10 Renal: creat WNL, D545 at 50 ml/hr Pulm:OSA, VDRF 1/24- 12/22/16 Cards: HTN, on metop 12.5 per tube bid Hepatobil:alb 1.9, LFTs WNL, Trig 145 on 2/10 - on fenofibrate PTA Neuro:dementia BJ:YNWGNFAO:possible asp PNA, WBC WNL, AF,  Zosyn 2/7>>  2/7  BC x2 >>ngtd  Best Practices:LMWH, pepcid per tube TPN Access: PICC placed for TPN 2/9 TPN start date:2/9  Nutritional Goals (per RD recommendation on 2/9): KCal: 1750-1950 Protein: 85 - 95  Current Nutrition: NPO x ic chips, TPN at 40 with lipids  Plan:  continue Clinimix E 4.25/25 at 40 ml/hr today until electrolytes repleted continue 20% lipid emulsion at 20 ml/hr - infuse over 12 hours Goal TPN is Clinimix E 5/15 at 80 ml/hr plus lipids to provide 90 gm protein and 1842 kcals for 100% support IVMF D545 at 50 ml/hr-  to keep IVF ~ 100 ml/hr or per MD orders This provides 41 g of protein and 1458 kCals per day meeting 48 % of protein and 83% of kCal needs MVI per tube Mag 4 gm bolus, kphos 15 mMol (22 meq K)  addition to scheduled KCL 40 per  tube qday- recheck lytes in am Continue current SSI and adjust as needed Monitor TPN labs F/U resolution of ileus  Herby AbrahamMichelle T. Jonny Longino, Pharm.D. [phone] 12/25/2016 7:52 AM

## 2016-12-26 LAB — BASIC METABOLIC PANEL
Anion gap: 8 (ref 5–15)
BUN: 10 mg/dL (ref 6–20)
CALCIUM: 8.4 mg/dL — AB (ref 8.9–10.3)
CO2: 24 mmol/L (ref 22–32)
CREATININE: 0.61 mg/dL (ref 0.44–1.00)
Chloride: 109 mmol/L (ref 101–111)
GFR calc non Af Amer: >60 mL/min (ref >60)
Glucose, Bld: 165 mg/dL — ABNORMAL HIGH (ref 65–99)
Potassium: 3.6 mmol/L (ref 3.5–5.1)
Sodium: 141 mmol/L (ref 135–145)

## 2016-12-26 LAB — GLUCOSE, CAPILLARY
GLUCOSE-CAPILLARY: 167 mg/dL — AB (ref 65–99)
GLUCOSE-CAPILLARY: 111 mg/dL — AB (ref 65–99)
GLUCOSE-CAPILLARY: 107 mg/dL — AB (ref 65–99)
GLUCOSE-CAPILLARY: 169 mg/dL — AB (ref 65–99)
Glucose-Capillary: 131 mg/dL — ABNORMAL HIGH (ref 65–99)
Glucose-Capillary: 136 mg/dL — ABNORMAL HIGH (ref 65–99)

## 2016-12-26 LAB — PHOSPHORUS: PHOSPHORUS: 3.5 mg/dL (ref 2.5–4.6)

## 2016-12-26 LAB — MAGNESIUM: Magnesium: 1.5 mg/dL — ABNORMAL LOW (ref 1.7–2.4)

## 2016-12-26 MED ORDER — MAGNESIUM SULFATE 50 % IJ SOLN
6.0000 g | Freq: Once | INTRAVENOUS | Status: AC
  Administered 2016-12-26: 6 g via INTRAVENOUS
  Filled 2016-12-26: qty 12

## 2016-12-26 MED ORDER — FAMOTIDINE IN NACL 20-0.9 MG/50ML-% IV SOLN
20.0000 mg | Freq: Once | INTRAVENOUS | Status: AC
  Administered 2016-12-27: 20 mg via INTRAVENOUS
  Filled 2016-12-26: qty 50

## 2016-12-26 MED ORDER — CLINIMIX E/DEXTROSE (4.25/25) 4.25 % IV SOLN
INTRAVENOUS | Status: AC
  Administered 2016-12-26: 18:00:00 via INTRAVENOUS
  Filled 2016-12-26: qty 960

## 2016-12-26 MED ORDER — POTASSIUM CHLORIDE 20 MEQ/15ML (10%) PO SOLN
40.0000 meq | Freq: Once | ORAL | Status: AC
  Administered 2016-12-26: 40 meq
  Filled 2016-12-26: qty 30

## 2016-12-26 MED ORDER — FAT EMULSION 20 % IV EMUL
240.0000 mL | INTRAVENOUS | Status: AC
  Administered 2016-12-26: 240 mL via INTRAVENOUS
  Filled 2016-12-26: qty 250

## 2016-12-26 MED ORDER — WHITE PETROLATUM GEL
Status: AC
  Administered 2016-12-26: 15:00:00
  Filled 2016-12-26: qty 1

## 2016-12-26 NOTE — Progress Notes (Signed)
[facility] Progress Note  4 Days Post-Op  Subjective: Oriented to person, place, not time. C/o abdominal soreness w/ movement. Unable to report if having bowel function. Per nurse and epic, no documented stools.  - drain 15 cc/24 h - NGT 750 cc/24 h, bilious/light brown  VSS Objective: Vital signs in last 24 hours: Temp:  [97.8 F (36.6 C)-98.6 F (37 C)] 98.5 F (36.9 C) (02/11 0410) Pulse Rate:  [79-81] 81 (02/11 0410) Resp:  [16-20] 16 (02/11 0410) BP: (124-148)/(42-60) 142/46 (02/11 0410) SpO2:  [98 %-100 %] 99 % (02/11 0410) Weight:  [100.5 kg (221 lb 9 oz)] 100.5 kg (221 lb 9 oz) (02/11 0141) Last BM Date: 12/21/16  Intake/Output from previous day: 02/10 0701 - 02/11 0700 In: 1315.7 [I.V.:1265.7; IV Piggyback:50] Out: 2140 [Urine:1375; Emesis/NG output:750; Drains:15] Intake/Output this shift: No intake/output data recorded.  PE: Gen:  Alert, NAD, pleasant HEENT:  on 2L Pulm:  Normal effort, decreased breath sounds bilateral lung bases Abd: Soft, appropriately tender, bowel sounds present in all 4 quadrants, distended, incision c/d/i under honeycomb, drain in place with minimal serosanguinous output  Lab Results:   Recent Labs  12/24/16 0518 12/25/16 0414  WBC 8.2 6.6  HGB 9.3* 8.5*  HCT 30.5* 28.2*  PLT 245 219   BMET  Recent Labs  12/25/16 0414 12/26/16 0546  NA 145 141  K 3.7 3.6  CL 113* 109  CO2 23 24  GLUCOSE 148* 165*  BUN 13 10  CREATININE 0.70 0.61  CALCIUM 8.4* 8.4*   PT/INR No results for input(s): LABPROT, INR in the last 72 hours. CMP     Component Value Date/Time   NA 141 12/26/2016 0546   K 3.6 12/26/2016 0546   CL 109 12/26/2016 0546   CO2 24 12/26/2016 0546   GLUCOSE 165 (H) 12/26/2016 0546   BUN 10 12/26/2016 0546   CREATININE 0.61 12/26/2016 0546   CALCIUM 8.4 (L) 12/26/2016 0546   PROT 5.7 (L) 12/25/2016 0414   ALBUMIN 1.9 (L) 12/25/2016 0414   AST 22 12/25/2016 0414   ALT 14 12/25/2016 0414   ALKPHOS 43 12/25/2016 0414   BILITOT 0.7 12/25/2016 0414   GFRNONAA >60 12/26/2016 0546   GFRAA >60 12/26/2016 0546   Lipase     Component Value Date/Time   LIPASE 61 (H) 12/08/2016 4098   Studies/Results: No results found.  Anti-infectives: Anti-infectives    Start     Dose/Rate Route Frequency Ordered Stop   12/22/16 1100  piperacillin-tazobactam (ZOSYN) IVPB 3.375 g     3.375 g 12.5 mL/hr over 240 Minutes Intravenous Every 8 hours 12/22/16 1026     12/08/16 1200  piperacillin-tazobactam (ZOSYN) IVPB 3.375 g  Status:  Discontinued     3.375 g 12.5 mL/hr over 240 Minutes Intravenous Every 8 hours 12/08/16 0717 12/11/16 0846   12/08/16 0345  vancomycin (VANCOCIN) IVPB 1000 mg/200 mL premix     1,000 mg 200 mL/hr over 60 Minutes Intravenous  Once 12/08/16 0333 12/08/16 0740   12/08/16 0345  piperacillin-tazobactam (ZOSYN) IVPB 3.375 g     3.375 g 100 mL/hr over 30 Minutes Intravenous  Once 12/08/16 1191 12/08/16 0741     Assessment/Plan Incarcerated possible strangulated ventral hernia with small bowel obstruction S/p exploratory laparotomy with primary ventral hernia repair, 12/22/16, DR. Cornett; POD#3 - Post op ileus, await return of bowel function - NGT to LIWS  - pain control - IS  - PT/OT    Probable aspiration pneumonia VDRF 1/24-27/2018  Acute kidney injury Anemia Dementia Malnutrition - now on TPN  FEN: IV fluids/NPO/TPN ID: Zosyn 12/22/16 >> (day #3/7) DVT: SCD/Lovenox  Plan: Await return of bowel function, continue NGT to LIWS, continue TPN.  Pain control and IS   LOS: 18 days    Adam PhenixElizabeth S Simaan , [facility] 12/26/2016, 8:38 AM Pager: [phone] Consults: [phone] Mon-Fri 7:00 am-4:30 pm Sat-Sun 7:00 am-11:30 am

## 2016-12-26 NOTE — Progress Notes (Signed)
At approximately 2115, patient began to call out that her stomach was hurting badly.  VS at 2120 showed BP was 94/70 and HR 71.  Her abdomen remains distended but audible bowel sounds in all 4 quadrants.  Her NGT continues to drain with approximately 200 cc dark brown fecal appearing material since 1900.  Less than 5 cc in JP drain.  4 mg IV Morphine given to help with abdominal pain.  Rapid Response called to evaluate patient.  VS retaken at 2245.  B/P was 136/45 and HR 72.  Pain is better at "medium" per patient.  Concerned about clamping NGT to given metoprolol and Pepcid through the tube.  Called [facility].  Metoprolol held tonight and Pepcid will be given IV.  Will continue to monitor patient.  [place] & MinorKimberly Dantre Yearwood RN-BC, WTA.

## 2016-12-26 NOTE — Progress Notes (Signed)
PROGRESS NOTE    Adrienne [name]  ZOX:[id number] DOB: [birth year] DOA: 12/08/2016 PCP: [name], MD   Brief Narrative:  [name] with pmhx significant for DM, HTN, OSA, and dementia who presented from her nursing home with abdominal distention and respiratory distress. Patient was found to have ileus vs SBO, high lactic acid. She was intubated on 1/24 to 1/27. Transferred to [facility] from [facility] on 12/13/2016. Patient improved clinically and repeat x-ray showed improvement with small bowel obstruction. Patient also noted to have diarrhea and had rectal tube placed. C. difficile PCR was negative. Abdominal pain had improved. Patient was placed on a diet which she was tolerating. Patient was to be discharged to the skilled nursing facility on 12/21/2016 however developed abdominal pain with significant emesis and discharge was canceled. KUB done was concerning for gastric outlet obstruction. CT abdomen and pelvis done 12/22/2016 was concerning for bowel strangulation and possible atelectasis and pneumonia. Discharge canceled. Patient kept nothing by mouth NG tube in place. General surgery consulted for further evaluation. Patient placed empirically on IV Zosyn.    Assessment & Plan:   Principal Problem:   Strangulation of intestine (HCC) Active Problems:   Postoperative ileus (HCC)   SBO (small bowel obstruction)   Acute on chronic respiratory failure with hypoxemia (HCC)   Acute renal failure (HCC)   Elevated lactic acid level   Metabolic acidosis   Ileus (HCC)   Ventilator dependent (HCC)   Pressure injury of skin   Oral phase dysphagia   DNR (do not resuscitate) discussion   Palliative care by specialist   Abdominal distention   Leukocytosis   Aspiration pneumonia of left lung (HCC)  #1 incarcerated possible strangulation of ventral hernia with small bowel obstruction/postoperative ileus Patient was to be discharged to skilled nursing facility on 12/21/2016, however while  awaiting transportation patient did have significant emesis with abdominal pain and a such discharge was canceled. NG tube was placed. KUB obtained on 12/21/2016 showed market gaseous dilatation of the stomach, increased compared to prior, outlet obstruction cannot be excluded. Patient was placed on bowel rest CT abdomen and pelvis was subsequently obtained on the morning of 12/22/2016 which showed ventral hernia containing bowel in addition to fluid and omental adipose tissue, there is portal venous gas in both this hernia and intra-abdominal portal veins in the liver concerning for possible bowel necrosis particularly involving the herniated segment. New peri-hepatic ascites. Lactic acid level is elevated at 3.3. Patient with a leukocytosis with a white count of 15.9. Patient status post exploratory laparotomy with repair primary ventral hernia (Dr. Luisa Hart 12/22/2016). Patient kept nothing by mouth. NG tube in place. Continue IV Zosyn.  Patient likely now with a postop ileus. Patient with no oral intake since admission. PICC line placed and patient started on TNA 12/24/2016. General surgery following and appreciate input and recommendations.  #2 probable aspiration pneumonia versus healthcare associated pneumonia CT abdomen and pelvis with mild progressive airspace opacity medially in the left lung likely combination of atelectasis and pneumonia. Patient with significant emesis just prior to discharge and concern for possible early aspiration pneumonia. Blood cultures pending. Sputum Gram stain and culture pending. Urine Legionella antigen negative. Urine pneumococcus antigen negative. WBC trending down. Continue empiric IV Zosyn day #4/7.  #3 HTN Patient with borderline blood pressure with systolic blood pressure in the 90s. Patient also noted with elevated lactic acid. Concern for bowel strangulation per CT abdomen and pelvis. Patient status post exploratory laparotomy with ventral hernia repair. Blood  pressure improved with hydration. Continue to hold antihypertensive medications. Follow.   #4 leukocytosis Likely secondary to problem #1 and 2. WBC trending down. Sputum Gram stain and culture pending. Urine strep pneumococcus antigen negative. Urine Legionella antigen negative. Blood cultures pending with no growth to date.. Continue empiric IV Zosyn D4/7.   #5 VDRF Early on in the hospitalization patient had to be intubated from 1/24 - 12/11/2016. Patient extubated around doing fine on nasal cannula. Follow.  #6 acute kidney injury Renal function improved. Avoid nephrotoxins.  #7 hypokalemia/hypomagnesemia Replete.  #8 acute blood loss anemia/iron deficiency anemia Hemoglobin of 8.5. Likely dilutional effect. No overt bleeding. Anemia panel consistent with iron deficiency anemia. s/p IV iron. Follow H&H. Transfusion threshold hemoglobin less than 8.  #8 dementia without behavioral disturbance Stable. Patient when medically stable will discharge to skilled nursing facility. Patient will need palliative care to follow-up in the outpatient setting.    DVT prophylaxis: SCDs Code Status: Full Family Communication: Updated patient. No family at bedside. Disposition Plan: Remain in-house. Likely to skilled nursing facility was bowel started to move an NG tube has been removed and patient tolerating a diet and off TNA. Per general surgery.   Consultants:   General surgery: Dr.Tsuei 12/08/2016  Wound care  Palliative care Lorinda CreedMary Larach, NP 12/19/2016  Procedures:   CT abdomen and pelvis 12/08/2006, 12/22/2016  Abdominal films 12/08/2016, 12/09/2016, 12/10/2016, 12/11/2016, 12/15/2016, 12/19/2016, 12/21/2016, 12/22/2016,  Chest x-ray 12/12/2016, 12/10/2016, 12/09/2016, 12/08/2016  Exploratory laparotomy with repair of ventral hernia.  Antimicrobials:  IV Zosyn 12/22/2016     Subjective: Patient sitting up in bed. NG tube in place. Patient denies chest pain. No shortness  of breath. No emesis. Abdominal pain improving. Patient denies any bowel movement. No flatus.  Objective: Vitals:   12/25/16 2219 12/26/16 0141 12/26/16 0410 12/26/16 0907  BP: (!) 148/60  (!) 142/46 (!) 146/71  Pulse: 79  81 78  Resp: 17  16 16   Temp: 98.6 F (37 C)  98.5 F (36.9 C) 98.2 F (36.8 C)  TempSrc: Oral  Oral Oral  SpO2: 100%  99% 99%  Weight: 100.5 kg (221 lb 9 oz) 100.5 kg (221 lb 9 oz)    Height:        Intake/Output Summary (Last 24 hours) at 12/26/16 1359 Last data filed at 12/26/16 1315  Gross per 24 hour  Intake          1637.66 ml  Output             2780 ml  Net         -1142.34 ml   Filed Weights   12/25/16 0456 12/25/16 2219 12/26/16 0141  Weight: 99.6 kg (219 lb 9.3 oz) 100.5 kg (221 lb 9 oz) 100.5 kg (221 lb 9 oz)    Examination:  General exam: NGT in place. Alert Respiratory system: Clear to auscultation Anterior lung fields. Respiratory effort normal. Cardiovascular system: S1 & S2 heard, RRR. No JVD, murmurs, rubs, gallops or clicks. No pedal edema. Gastrointestinal system: Abdomen is soft and hypoactive bowel sounds.Less Distended. Abdominal binder in place. Central nervous system: Alert and oriented. No focal neurological deficits. Extremities: Symmetric 5 x 5 power. Skin: No rashes, lesions or ulcers Psychiatry: Judgement and insight appear normal. Mood & affect appropriate.     Data Reviewed: I have personally reviewed following labs and imaging studies  CBC:  Recent Labs Lab 12/20/16 0617 12/22/16 0141 12/23/16 0613 12/24/16 0518 12/25/16 0414  WBC 11.9* 15.9* 7.4 8.2  6.6  NEUTROABS  --  13.6* 4.6  --  3.9  HGB 10.4* 11.8* 9.4* 9.3* 8.5*  HCT 33.0* 37.4 30.2* 30.5* 28.2*  MCV 98.5 98.9 101.0* 102.3* 102.5*  PLT 252 329 262 245 219   Basic Metabolic Panel:  Recent Labs Lab 12/22/16 0141 12/23/16 0751 12/24/16 0518 12/25/16 0414 12/26/16 0546  NA 138 144 145 145 141  K 3.7 4.2 3.7 3.7 3.6  CL 110 114* 114* 113*  109  CO2 18* 18* 17* 23 24  GLUCOSE 154* 147* 124* 148* 165*  BUN 9 21* 22* 13 10  CREATININE 0.91 1.61* 0.94 0.70 0.61  CALCIUM 8.7* 7.9* 8.3* 8.4* 8.4*  MG  --  1.2* 2.0 1.6* 1.5*  PHOS  --   --   --  2.2* 3.5   GFR: Estimated Creatinine Clearance: 66.8 mL/min (by C-G formula based on SCr of 0.61 mg/dL). Liver Function Tests:  Recent Labs Lab 12/22/16 0141 12/23/16 0751 12/25/16 0414  AST 37 22 22  ALT 22 17 14   ALKPHOS 59 41 43  BILITOT 0.8 1.1 0.7  PROT 6.9 5.9* 5.7*  ALBUMIN 2.7* 2.3* 1.9*   No results for input(s): LIPASE, AMYLASE in the last 168 hours. No results for input(s): AMMONIA in the last 168 hours. Coagulation Profile: No results for input(s): INR, PROTIME in the last 168 hours. Cardiac Enzymes: No results for input(s): CKTOTAL, CKMB, CKMBINDEX, TROPONINI in the last 168 hours. BNP (last 3 results) No results for input(s): PROBNP in the last 8760 hours. HbA1C: No results for input(s): HGBA1C in the last 72 hours. CBG:  Recent Labs Lab 12/25/16 2048 12/26/16 0015 12/26/16 0406 12/26/16 0751 12/26/16 1130  GLUCAP 133* 136* 131* 167* 111*   Lipid Profile:  Recent Labs  12/25/16 0414  TRIG 146   Thyroid Function Tests: No results for input(s): TSH, T4TOTAL, FREET4, T3FREE, THYROIDAB in the last 72 hours. Anemia Panel: No results for input(s): VITAMINB12, FOLATE, FERRITIN, TIBC, IRON, RETICCTPCT in the last 72 hours. Sepsis Labs:  Recent Labs Lab 12/22/16 0141 12/22/16 0623 12/22/16 0806 12/23/16 0920  LATICACIDVEN 3.1* 3.3* 3.3* 1.8    Recent Results (from the past 240 hour(s))  Culture, blood (routine x 2) Call MD if unable to obtain prior to antibiotics being given     Status: None (Preliminary result)   Collection Time: 12/22/16 11:13 AM  Result Value Ref Range Status   Specimen Description BLOOD LEFT HAND  Final   Special Requests IN PEDIATRIC BOTTLE 1CC  Final   Culture NO GROWTH 3 DAYS  Final   Report Status PENDING   Incomplete  Culture, blood (routine x 2) Call MD if unable to obtain prior to antibiotics being given     Status: None (Preliminary result)   Collection Time: 12/22/16 11:24 AM  Result Value Ref Range Status   Specimen Description BLOOD LEFT HAND  Final   Special Requests IN PEDIATRIC BOTTLE 3CC  Final   Culture NO GROWTH 3 DAYS  Final   Report Status PENDING  Incomplete  Urine culture     Status: None   Collection Time: 12/23/16 11:41 PM  Result Value Ref Range Status   Specimen Description URINE, RANDOM  Final   Special Requests NONE  Final   Culture NO GROWTH  Final   Report Status 12/25/2016 FINAL  Final         Radiology Studies: No results found.      Scheduled Meds: . enoxaparin (LOVENOX) injection  40  mg Subcutaneous Q24H  . famotidine  20 mg Per Tube BID  . insulin aspart  0-9 Units Subcutaneous Q4H  . mouth rinse  15 mL Mouth Rinse BID  . metoprolol tartrate  12.5 mg Per Tube BID  . multivitamin  15 mL Per Tube Daily  . piperacillin-tazobactam (ZOSYN)  IV  3.375 g Intravenous Q8H  . potassium chloride  40 mEq Per Tube Daily   Continuous Infusions: . dextrose 5 % and 0.45% NaCl 50 mL/hr at 12/26/16 0645  . Marland KitchenTPN (CLINIMIX-E) Adult     And  . fat emulsion    . Marland KitchenTPN (CLINIMIX-E) Adult 40 mL/hr at 12/25/16 1734     LOS: 18 days    Time spent: 35 mins    Glenroy Crossen, MD [facility] Pager [phone]  If 7PM-7AM, please contact night-coverage [email or web address] Password [facility] 12/26/2016, 1:59 PM

## 2016-12-26 NOTE — Significant Event (Signed)
Rapid Response Event Note  Overview:Called re need for second set of eyes on post-op patient with BP-90s and increased abd pain. Time Called: 2231 Arrival Time: 2237 Event Type: Hypotension, Other (Comment) (abd pain)  Initial Focused Assessment:  Pt awake, alert.  Skin warm and dry, abd distended, soft, painful to touch.  Bowel sounds active.  NGT tube to LIWS with brown, bilious drainage. Pt BP PTA RRT was 90s.  At time of arrival BP-130s, HR-73s, RR -18, sats 98% on 2L [place]. Pt says abd is painful but no different than it has been.  Interventions: No interventions needed     Plan of Care (if not transferred): Monitor pain, BP, and ngt.  Called RRT if needed. Event Summary:   at      at    Outcome: Stayed in room and stabalized  Event End Time: 2240  Terrilyn SaverHopper, [place] Kilpatrick Anderson

## 2016-12-26 NOTE — Progress Notes (Signed)
PHARMACY - ADULT TOTAL PARENTERAL NUTRITION CONSULT NOTE   Pharmacy Consult for TPN Indication: prolonged ileus Patient Measurements: Height: 5\' 4"  (162.6 cm) Weight: 221 lb 9 oz (100.5 kg) IBW/kg (Calculated) : 54.7 TPN AdjBW (KG): 66 Body mass index is 38.03 kg/m.   Assessment: [name] admitted on 12/08/16.  POD #4. Pharmacy consulted to dose TPN for prolonged ileus - started on 2/9.  Not able to advance TPN yet due to low electrolytes/refeeding.   GI: Incarcerated possible strangulated ventral hernia with small bowel obstruction S/p exploratory laparotomy with primary ventral hernia repair, 12/22/16, Dr. Luisa Hartornett Post op ileus; NGO 600/24 hrs, 200 overnight, drain 60/24 hrs;  last BM 2/6; pepcid per tube Endo: DM PTA (on metformin PTA), on SSI, CBGs all < 150, serum gluc 165  Insulin requirements in the past 24 hours: 5 Lytes:K 3.6 after 40 per tube and 22 meq IV given yesterday (goal >= 4 for ileus) has 40 per tube daily ordered per MD;  mag 1.5 after 4 gm given yesterday (goal 2 for > for ileus) phos 3.5 after 15 mM kphos yesterday, CorCa 10 Renal: creat WNL, D545 at 50 ml/hr Pulm:OSA, VDRF 1/24- 12/22/16 Cards: HTN, on metop 12.5 per tube bid Hepatobil:alb 1.9, LFTs WNL, Trig 145 on 2/10 - on fenofibrate PTA Neuro:dementia [id number]:possible asp PNA, WBC WNL, AF,  Zosyn 2/7>>  2/7  BC x2 >>ngtd 2/8 Ucx NGF 1/28 C diff neg HIV neg, legionella urine neg, strep pneumo urine neg  Best Practices:LMWH, pepcid per tube TPN Access: PICC placed for TPN 2/9 TPN start date:2/9  Nutritional Goals (per RD recommendation on 2/9): KCal: 1750-1950 Protein: 85 - 95  Current Nutrition: NPO x ic chips, TPN at 40 with lipids  Plan:  continue Clinimix E 4.25/25 at 40 ml/hr today until electrolytes repleted continue 20% lipid emulsion at 20 ml/hr - infuse over 12 hours Goal TPN is Clinimix E 5/15 at 80 ml/hr plus lipids to provide 90 gm protein and 1842 kcals for 100% support IVMF D545 at 50  ml/hr-  to keep IVF ~ 100 ml/hr or per MD orders This provides 41 g of protein and 1458 kCals per day meeting 48 % of protein and 83% of kCal needs MVI per tube Mag 6 gm bolus, KCL 40 meq per tube in addition to scheduled KCL 40 per tube qday-  Continue current SSI and adjust as needed Monitor TPN labs F/U resolution of ileus  Adrienne AbrahamMichelle T. Zorianna [name], Pharm.D. [phone] 12/26/2016 7:28 AM

## 2016-12-27 LAB — GLUCOSE, CAPILLARY
GLUCOSE-CAPILLARY: 121 mg/dL — AB (ref 65–99)
GLUCOSE-CAPILLARY: 123 mg/dL — AB (ref 65–99)
GLUCOSE-CAPILLARY: 127 mg/dL — AB (ref 65–99)
GLUCOSE-CAPILLARY: 131 mg/dL — AB (ref 65–99)
Glucose-Capillary: 154 mg/dL — ABNORMAL HIGH (ref 65–99)
Glucose-Capillary: 145 mg/dL — ABNORMAL HIGH (ref 65–99)
Glucose-Capillary: 131 mg/dL — ABNORMAL HIGH (ref 65–99)

## 2016-12-27 LAB — CULTURE, BLOOD (ROUTINE X 2)
CULTURE: NO GROWTH
CULTURE: NO GROWTH

## 2016-12-27 LAB — DIFFERENTIAL
Basophils Absolute: 0.1 10*3/uL (ref 0.0–0.1)
Basophils Relative: 1 %
EOS ABS: 0.2 10*3/uL (ref 0.0–0.7)
Eosinophils Relative: 3 %
LYMPHS ABS: 2.7 10*3/uL (ref 0.7–4.0)
LYMPHS PCT: 32 %
MONOS PCT: 7 %
Monocytes Absolute: 0.6 10*3/uL (ref 0.1–1.0)
NEUTROS PCT: 57 %
Neutro Abs: 4.9 10*3/uL (ref 1.7–7.7)

## 2016-12-27 LAB — CBC
HEMATOCRIT: 28.9 % — AB (ref 36.0–46.0)
HEMOGLOBIN: 9.1 g/dL — AB (ref 12.0–15.0)
MCH: 31.4 pg (ref 26.0–34.0)
MCHC: 31.5 g/dL (ref 30.0–36.0)
MCV: 99.7 fL (ref 78.0–100.0)
Platelets: 194 10*3/uL (ref 150–400)
RBC: 2.9 MIL/uL — AB (ref 3.87–5.11)
RDW: 15.5 % (ref 11.5–15.5)
WBC: 8.5 10*3/uL (ref 4.0–10.5)

## 2016-12-27 LAB — COMPREHENSIVE METABOLIC PANEL
ALBUMIN: 1.9 g/dL — AB (ref 3.5–5.0)
ALT: 13 U/L — AB (ref 14–54)
AST: 23 U/L (ref 15–41)
Alkaline Phosphatase: 43 U/L (ref 38–126)
Anion gap: 6 (ref 5–15)
BUN: 8 mg/dL (ref 6–20)
CHLORIDE: 109 mmol/L (ref 101–111)
CO2: 23 mmol/L (ref 22–32)
CREATININE: 0.58 mg/dL (ref 0.44–1.00)
Calcium: 8.2 mg/dL — ABNORMAL LOW (ref 8.9–10.3)
GFR calc Af Amer: >60 mL/min (ref >60)
GFR calc non Af Amer: >60 mL/min (ref >60)
GLUCOSE: 147 mg/dL — AB (ref 65–99)
POTASSIUM: 3.8 mmol/L (ref 3.5–5.1)
SODIUM: 138 mmol/L (ref 135–145)
Total Bilirubin: 0.3 mg/dL (ref 0.3–1.2)
Total Protein: 5.8 g/dL — ABNORMAL LOW (ref 6.5–8.1)

## 2016-12-27 LAB — PREALBUMIN: PREALBUMIN: 8 mg/dL — AB (ref 18–38)

## 2016-12-27 LAB — TRIGLYCERIDES: Triglycerides: 153 mg/dL — ABNORMAL HIGH (ref <150)

## 2016-12-27 LAB — MAGNESIUM: Magnesium: 1.7 mg/dL (ref 1.7–2.4)

## 2016-12-27 LAB — PHOSPHORUS: Phosphorus: 3.5 mg/dL (ref 2.5–4.6)

## 2016-12-27 MED ORDER — SODIUM CHLORIDE 0.9 % IV SOLN
30.0000 meq | Freq: Once | INTRAVENOUS | Status: AC
  Administered 2016-12-27: 30 meq via INTRAVENOUS
  Filled 2016-12-27: qty 15

## 2016-12-27 MED ORDER — FAMOTIDINE 200 MG/20ML IV SOLN
INTRAVENOUS | Status: AC
  Administered 2016-12-27: 17:00:00 via INTRAVENOUS
  Filled 2016-12-27: qty 1440

## 2016-12-27 MED ORDER — DEXTROSE-NACL 5-0.45 % IV SOLN: INTRAVENOUS | Status: DC

## 2016-12-27 MED ORDER — MENTHOL 3 MG MT LOZG: 1.0000 | LOZENGE | OROMUCOSAL | Status: DC | PRN

## 2016-12-27 MED ORDER — FAT EMULSION 20 % IV EMUL
240.0000 mL | INTRAVENOUS | Status: AC
  Administered 2016-12-27: 240 mL via INTRAVENOUS
  Filled 2016-12-27: qty 250

## 2016-12-27 MED ORDER — MAGNESIUM SULFATE 50 % IJ SOLN
6.0000 g | Freq: Once | INTRAMUSCULAR | Status: AC
  Administered 2016-12-27: 6 g via INTRAVENOUS
  Filled 2016-12-27: qty 12

## 2016-12-27 NOTE — Progress Notes (Signed)
PHARMACY - ADULT TOTAL PARENTERAL NUTRITION CONSULT NOTE   Pharmacy Consult for TPN Indication: prolonged ileus Patient Measurements: Height: 5\' 4"  (162.6 cm) Weight: 225 lb 12 oz (102.4 kg) IBW/kg (Calculated) : 54.7 TPN AdjBW (KG): 66 Body mass index is 38.75 kg/m.   Assessment: [name] admitted on 12/08/16.  POD #5. Pharmacy consulted to dose TPN for prolonged ileus - started on 2/9.  Not able to advance TPN yet due to low electrolytes/refeeding.   GI: Incarcerated possible strangulated ventral hernia with small bowel obstruction. S/p exploratory laparotomy with primary ventral hernia repair, 12/22/16. Post op ileus; NG output 825/24 hrs, drain 25/24 hrs; last BM 2/6 Abdominal pain last night, per tube meds held   Pepcid per tube > change to TPN Endo: DM PTA (on metformin PTA), on SSI, CBGs all < 150 Insulin requirements in the past 24 hours: 6 Lytes: K 3.8, CoCa 9.8, Mg 1.7  On 40 mEq KCl per tube daily, K remains low   Mg 6 g IV x1 ordered by MD Renal: SCr 0.5, D545 at 50 ml/hr Pulm:OSA, VDRF 1/24- 12/22/16, now 2L [place] Cards: HTN, on metop 12.5 per tube bid Hepatobil:alb 1.9, LFTs WNL, Trig 145 on 2/10 - on fenofibrate PTA Neuro:dementia ZH:[id number]:possible asp PNA, WBC WNL, Zosyn day #6 Zosyn 2/7>> 2/7  BC x2 >>ngtd 2/8 Ucx NGF 1/28 C diff neg HIV neg, legionella urine neg, strep pneumo urine neg  Best Practices:LMWH, pepcid per tube TPN Access: PICC placed for TPN 2/9 TPN start date:2/9  Nutritional Goals  per RD recommendation on 2/9: KCal: 1750-1950 Protein: 85 - 95  Current Nutrition:  TPN at 40 with lipids D5-1/2NS @ 50 ml/hr  Plan:  -Increase Clinimix E to 5/15 60 ml/hr -Continue 20% lipid emulsion at 20 ml/hr - infuse over 12 hours -Reduce IVMF D51/2NS to 40 ml/hr, to keep IVF ~ 100 ml/hr or per MD orders -This provides 72 g of protein and 1502 kCals per day meeting 85% of protein and 86% of kCal needs -Change Pepcid, MVI from per tube to TPN  -KCl 30 mEq  x1 -Monitor TPN labs -F/U resolution of ileus    Agapito GamesAlison Emrik Erhard, PharmD, BCPS Clinical Pharmacist 12/27/2016 9:35 AM

## 2016-12-27 NOTE — Progress Notes (Signed)
Daily Progress Note   Patient Name: Adrienne [name]       Date: 12/27/2016 DOB: [birth year]  Age: 81 y.o. MRN#: [id number] Attending Physician: Eugenie Filler, MD Primary Care Physician: Horatio Pel, MD Admit Date: 12/08/2016  Reason for Consultation/Follow-up: Establishing goals of care and Psychosocial/spiritual support  Subjective: Lying in bed. Laughing at the television.   Length of Stay: 19  Current Medications: Scheduled Meds:  . enoxaparin (LOVENOX) injection  40 mg Subcutaneous Q24H  . insulin aspart  0-9 Units Subcutaneous Q4H  . mouth rinse  15 mL Mouth Rinse BID  . piperacillin-tazobactam (ZOSYN)  IV  3.375 g Intravenous Q8H  . potassium chloride  40 mEq Per Tube Daily    Continuous Infusions: . dextrose 5 % and 0.45% NaCl 50 mL/hr at 12/27/16 0432  . dextrose 5 % and 0.45% NaCl    . Marland KitchenTPN (CLINIMIX-E) Adult 60 mL/hr at 12/27/16 1711   And  . fat emulsion 240 mL (12/27/16 1711)    PRN Meds: acetaminophen (TYLENOL) oral liquid 160 mg/5 mL, ipratropium-albuterol, LORazepam, menthol-cetylpyridinium, morphine injection, ondansetron (ZOFRAN) IV, promethazine, sodium chloride flush  Physical Exam  Constitutional: She appears well-developed. She appears ill.  HENT:  Head: Normocephalic and atraumatic.  Cardiovascular: Normal rate.   Pulmonary/Chest: Effort normal. No accessory muscle usage. No tachypnea. No respiratory distress. She has decreased breath sounds in the right lower field and the left lower field.  Abdominal: She exhibits distension. There is generalized tenderness.  Neurological: She is alert. She is disoriented.  Psychiatric: Cognition and memory are impaired.  Nursing note and vitals reviewed.           Vital Signs: BP 137/65 (BP Location:  Left Arm)   Pulse 78   Temp 98.3 F (36.8 C) (Oral)   Resp 17   Ht _0  (1.626 m)   Wt 102.4 kg (225 lb 12 oz)   SpO2 98%   BMI 38.75 kg/m  SpO2: SpO2: 98 % O2 Device: O2 Device: Nasal Cannula O2 Flow Rate: O2 Flow Rate (L/min): 2 L/min  Intake/output summary:  Intake/Output Summary (Last 24 hours) at 12/27/16 1759 Last data filed at 12/27/16 1503  Gross per 24 hour  Intake             1654 ml  Output  1275 ml  Net              379 ml   LBM: Last BM Date: 12/21/16 Baseline Weight: Weight: 102.1 kg (225 lb) Most recent weight: Weight: 102.4 kg (225 lb 12 oz)       Palliative Assessment/Data:    Flowsheet Rows   Flowsheet Row Most Recent Value  Intake Tab  Referral Department  Hospitalist  Unit at Time of Referral  Med/Surg Unit  Palliative Care Primary Diagnosis  Sepsis/Infectious Disease  Date Notified  12/18/16  Palliative Care Type  New Palliative care  Reason for referral  Clarify Goals of Care  Date of Admission  12/08/16  Date first seen by Palliative Care  12/19/16  # of days Palliative referral response time  1 Day(s)  # of days IP prior to Palliative referral  10  Clinical Assessment  Palliative Performance Scale Score  30%  Psychosocial & Spiritual Assessment  Palliative Care Outcomes      Patient Active Problem List   Diagnosis Date Noted  . Postoperative ileus ([place]) 12/24/2016  . Strangulation of intestine ([place]) 12/22/2016  . Leukocytosis   . Aspiration pneumonia of left lung ([place])   . DNR (do not resuscitate) discussion 12/19/2016  . Palliative care by specialist 12/19/2016  . Abdominal distention   . Oral phase dysphagia   . Pressure injury of skin 12/11/2016  . Ventilator dependent ([place])   . SBO (small bowel obstruction) 12/08/2016  . Acute on chronic respiratory failure with hypoxemia ([place])   . Acute renal failure ([place])   . Elevated lactic acid level   . Metabolic acidosis   . Ileus ([place])   . Pneumonia 11/02/2013  .  Syncope 11/02/2013  . Falls frequently 11/02/2013  . Anxiety state, unspecified 11/02/2013  . Depression 11/02/2013  . Unspecified hypothyroidism 11/02/2013  . Essential hypertension 11/02/2013  . HLD (hyperlipidemia) 11/02/2013  . Diabetes ([place]) 11/02/2013  . Dementia with behavioral disturbance 11/02/2013  . Chronic pain syndrome 11/02/2013  . Headache(784.0) 10/23/2013  . Postop Hypokalemia 01/15/2013  . Postoperative anemia due to acute blood loss 01/15/2013  . Postop Transfusion 01/15/2013  . OA (osteoarthritis) of knee 04/21/2012  . Pre-operative clearance 02/08/2012  . OSA (obstructive sleep apnea) 05/03/2011    Palliative Care Assessment & Plan   HPI: 81 y.o. female  admitted on 12/08/2016 with pmhx significant for DM, HTN, OSA, and dementia who presents from her nursing home with abdominal distention and respiratory distress.   Patient's daughter was called by the nursing home/[facility] saying that patient's abdomen was distended and that she has one episode of emesis overnight. She was brought in to the ED for evaluation.   SBO/ resolving , ARF required intubation this admission and now extubated and stable.  Lactic acidosis resolving. Remains high risk for decompensation.  Plan was for discharge 12/21/16 but she developed severe abd pain and emesis requiring NGT and then surgery 12/22/16 exp lap with repair of primary ventral hernia that appeared on CT with bowel strangulation. Now with post op ileus.   Family faced with advanced directive decisions and anticipatory care needs  Assessment: I met with [name] and her daughter, Levada Dy (I have worked extensively with this family with another family member). Discussed with Levada Dy her mother's health PTA and this seems to have been stable with gradually progressing dementia. We discussed events of hospitalization up to now and current post op ileus. Discussed signs of worsening with worse abd pain, firmness, NGT output.  Levada Dy remains hopeful for improvement and return to [facility]. Many questions answered and emotional support provided.   Recommendations/Plan:  Continue current treatments per primary and surgery.   Goals of Care and Additional Recommendations:  Limitations on Scope of Treatment: Full Scope Treatment  Code Status:  Full code  Prognosis:   Unable to determine  Discharge Planning:  Hopeful for return to SNF.    Thank you for allowing the Palliative Medicine Team to assist in the care of this patient.   Total Time 3mn Prolonged Time Billed  no       Greater than 50%  of this time was spent counseling and coordinating care related to the above assessment and plan.  AVinie Sill NP Palliative Medicine Team Pager # [phone](M-F 8a-5p) Team Phone # [phone](Nights/Weekends)

## 2016-12-27 NOTE — Progress Notes (Signed)
Physical Therapy Treatment Patient Details Name: Adrienne [name] MRN: [id number] DOB: 17-Mar-1936 Today's Date: 12/27/2016    History of Present Illness 81 yo female admitted with distended abdomen/nausea, ileus vs SBO, vent d/c 12/11/16. Acute respiratory failure with hypoxia: Required mechanical ventilator  Exploratory laparotomy with repair primary a ventral hernia 2/7.    PT Comments    Pt continuing to require +2 max assistance for bed mobility. Attempted sit<>stand with stedy with +3 assist and bed elevated. Pt unable to achieve standing. Maximove required for transfer from bed to chair. Continuing to recommend SNF for further rehabilitation following acute stay.   Follow Up Recommendations  SNF     Equipment Recommendations   (to be determined)    Recommendations for Other Services       Precautions / Restrictions Precautions Precautions: Fall Precaution Comments: NG tube/abdominal binder. JP drain Restrictions Weight Bearing Restrictions: No    Mobility  Bed Mobility Overal bed mobility: Needs Assistance Bed Mobility: Supine to Sit     Supine to sit: +2 for physical assistance;Max assist     General bed mobility comments: Assist needed at LEs and trunk, pt able to move LEs slightly toward EOB  Transfers Overall transfer level: Needs assistance               General transfer comment: Attempting sit/stand with stedy X3 and +3 assist but pt unable to achieve standing. Noted pt pushing posteriorly and unwilling to transition hips forward to stand. Using maximove for transfer from bed to chair.   Ambulation/Gait                 Stairs            Wheelchair Mobility    Modified Rankin (Stroke Patients Only)       Balance Overall balance assessment: Needs assistance Sitting-balance support: Bilateral upper extremity supported;Feet supported Sitting balance-Leahy Scale: Poor Sitting balance - Comments: able to sit with min guard assist                             Cognition Arousal/Alertness: Awake/alert Behavior During Therapy: Anxious Overall Cognitive Status: Impaired/Different from baseline Area of Impairment: Safety/judgement;Following commands;Orientation Orientation Level: Disoriented to;Situation   Memory: Decreased short-term memory Following Commands: Follows one step commands inconsistently Safety/Judgement: Decreased awareness of safety          Exercises      General Comments        Pertinent Vitals/Pain Pain Assessment: Faces Faces Pain Scale: Hurts little more Pain Location: abdomen Pain Descriptors / Indicators: Grimacing;Guarding;Moaning Pain Intervention(s): Limited activity within patient's tolerance;Monitored during session    Home Living                      Prior Function            PT Goals (current goals can now be found in the care plan section) Acute Rehab PT Goals Patient Stated Goal: none expressed PT Goal Formulation: Patient unable to participate in goal setting Time For Goal Achievement: 12/29/16 Potential to Achieve Goals: Fair Progress towards PT goals: Progressing toward goals (very limited progress with mobility)    Frequency    Min 2X/week      PT Plan Frequency needs to be updated    Co-evaluation             End of Session Equipment Utilized During Treatment: Gait belt Activity Tolerance:  (  fear of falling) Patient left: in chair;with call bell/phone within reach;with nursing/sitter in room     Time: [id number] PT Time Calculation (min) (ACUTE ONLY): 38 min  Charges:  $Therapeutic Activity: 38-52 mins                    G Codes:      Christiane HaBenjamin J. Hope Holst, PT, CSCS Pager [phone] Office [phone]  12/27/2016, 1:48 PM

## 2016-12-27 NOTE — Progress Notes (Signed)
PROGRESS NOTE    Adrienne [name]  ZOX:[id number] DOB: [birth year] DOA: 12/08/2016 PCP: [name], MD   Brief Narrative:  [name] with pmhx significant for DM, HTN, OSA, and dementia who presented from her nursing home with abdominal distention and respiratory distress. Patient was found to have ileus vs SBO, high lactic acid. She was intubated on 1/24 to 1/27. Transferred to [facility] from [facility] on 12/13/2016. Patient improved clinically and repeat x-ray showed improvement with small bowel obstruction. Patient also noted to have diarrhea and had rectal tube placed. C. difficile PCR was negative. Abdominal pain had improved. Patient was placed on a diet which she was tolerating. Patient was to be discharged to the skilled nursing facility on 12/21/2016 however developed abdominal pain with significant emesis and discharge was canceled. KUB done was concerning for gastric outlet obstruction. CT abdomen and pelvis done 12/22/2016 was concerning for bowel strangulation and possible atelectasis and pneumonia. Discharge canceled. Patient kept nothing by mouth NG tube in place. General surgery consulted for further evaluation. Patient placed empirically on IV Zosyn.    Assessment & Plan:   Principal Problem:   Strangulation of intestine (HCC) Active Problems:   Postoperative ileus (HCC)   SBO (small bowel obstruction)   Acute on chronic respiratory failure with hypoxemia (HCC)   Acute renal failure (HCC)   Elevated lactic acid level   Metabolic acidosis   Ileus (HCC)   Ventilator dependent (HCC)   Pressure injury of skin   Oral phase dysphagia   DNR (do not resuscitate) discussion   Palliative care by specialist   Abdominal distention   Leukocytosis   Aspiration pneumonia of left lung (HCC)  #1 incarcerated possible strangulation of ventral hernia with small bowel obstruction/postoperative ileus Patient was to be discharged to skilled nursing facility on 12/21/2016, however while  awaiting transportation patient did have significant emesis with abdominal pain and a such discharge was canceled. NG tube was placed. KUB obtained on 12/21/2016 showed market gaseous dilatation of the stomach, increased compared to prior, outlet obstruction cannot be excluded. Patient was placed on bowel rest CT abdomen and pelvis was subsequently obtained on the morning of 12/22/2016 which showed ventral hernia containing bowel in addition to fluid and omental adipose tissue, there is portal venous gas in both this hernia and intra-abdominal portal veins in the liver concerning for possible bowel necrosis particularly involving the herniated segment. New peri-hepatic ascites. Lactic acid level is elevated at 3.3. Patient with a leukocytosis with a white count of 15.9. Patient status post exploratory laparotomy with repair primary ventral hernia (Dr. Luisa Hart 12/22/2016). Patient kept nothing by mouth. NG tube in place. Continue IV Zosyn.  Patient likely now with a postop ileus. Patient with no oral intake since admission. PICC line placed and patient started on TNA 12/24/2016. General surgery following and appreciate input and recommendations.  #2 probable aspiration pneumonia versus healthcare associated pneumonia CT abdomen and pelvis with mild progressive airspace opacity medially in the left lung likely combination of atelectasis and pneumonia. Patient with significant emesis just prior to discharge and concern for possible early aspiration pneumonia. Blood cultures pending. Sputum Gram stain and culture pending. Urine Legionella antigen negative. Urine pneumococcus antigen negative. WBC trending down. Continue empiric IV Zosyn day #5/7.  #3 HTN Patient with borderline blood pressure with systolic blood pressure in the 90s. Patient also noted with elevated lactic acid. Concern for bowel strangulation per CT abdomen and pelvis. Patient status post exploratory laparotomy with ventral hernia repair. Blood  pressure improved with hydration. Continue to hold antihypertensive medications. Follow.   #4 leukocytosis Likely secondary to problem #1 and 2. WBC trending down. Sputum Gram stain and culture pending. Urine strep pneumococcus antigen negative. Urine Legionella antigen negative. Blood cultures pending with no growth to date.. Continue empiric IV Zosyn D5/7.   #5 VDRF Early on in the hospitalization patient had to be intubated from 1/24 - 12/11/2016. Patient extubated around doing fine on nasal cannula. Follow.  #6 acute kidney injury Renal function improved. Avoid nephrotoxins.  #7 hypokalemia/hypomagnesemia Replete.  #8 acute blood loss anemia/iron deficiency anemia Hemoglobin of 9.1. Likely dilutional effect. No overt bleeding. Anemia panel consistent with iron deficiency anemia. s/p IV iron. Follow H&H. Transfusion threshold hemoglobin less than 8.  #8 dementia without behavioral disturbance Stable. Patient when medically stable will discharge to skilled nursing facility. Patient will need palliative care to follow-up in the outpatient setting.    DVT prophylaxis: SCDs Code Status: Full Family Communication: Updated patient. No family at bedside. Disposition Plan: Remain in-house. Likely to skilled nursing facility once bowels start to move an NG tube has been removed and patient tolerating a diet and off TNA. Per general surgery.   Consultants:   General surgery: Dr.Tsuei 12/08/2016  Wound care  Palliative care Lorinda CreedMary Larach, NP 12/19/2016  Procedures:   CT abdomen and pelvis 12/08/2006, 12/22/2016  Abdominal films 12/08/2016, 12/09/2016, 12/10/2016, 12/11/2016, 12/15/2016, 12/19/2016, 12/21/2016, 12/22/2016,  Chest x-ray 12/12/2016, 12/10/2016, 12/09/2016, 12/08/2016  Exploratory laparotomy with repair of ventral hernia.  Antimicrobials:  IV Zosyn 12/22/2016     Subjective: Patient sitting up in chair. NG tube in place. Patient denies chest pain. No shortness  of breath. No emesis. Abdominal pain improving. Patient denies any bowel movement. No flatus.patient complaining of pain all   Objective: Vitals:   12/26/16 2245 12/27/16 0414 12/27/16 0448 12/27/16 1000  BP: (!) 136/45 (!) 129/41  (!) 143/51  Pulse: 72 69  75  Resp: 18 17  17   Temp:  98.5 F (36.9 C)  98.7 F (37.1 C)  TempSrc:  Oral  Oral  SpO2: 98% 99%  98%  Weight:   102.4 kg (225 lb 12 oz)   Height:        Intake/Output Summary (Last 24 hours) at 12/27/16 1353 Last data filed at 12/27/16 1152  Gross per 24 hour  Intake             2004 ml  Output             2000 ml  Net                4 ml   Filed Weights   12/26/16 0141 12/26/16 2120 12/27/16 0448  Weight: 100.5 kg (221 lb 9 oz) 102.4 kg (225 lb 12 oz) 102.4 kg (225 lb 12 oz)    Examination:  General exam: NGT in place. Alert. In chair. Respiratory system: Clear to auscultation. Anterior lung fields. Respiratory effort normal. Cardiovascular system: S1 & S2 heard, RRR. No JVD, murmurs, rubs, gallops or clicks. No pedal edema. Gastrointestinal system: Abdomen is soft and positive bowel sounds. Less Distended. Abdominal binder in place. Central nervous system: Alert and oriented. No focal neurological deficits. Extremities: Symmetric 5 x 5 power. Skin: No rashes, lesions or ulcers Psychiatry: Judgement and insight appear normal. Mood & affect appropriate.     Data Reviewed: I have personally reviewed following labs and imaging studies  CBC:  Recent Labs Lab 12/22/16 0141 12/23/16 [id number] 12/24/16 0518  12/25/16 0414 12/27/16 0451  WBC 15.9* 7.4 8.2 6.6 8.5  NEUTROABS 13.6* 4.6  --  3.9 4.9  HGB 11.8* 9.4* 9.3* 8.5* 9.1*  HCT 37.4 30.2* 30.5* 28.2* 28.9*  MCV 98.9 101.0* 102.3* 102.5* 99.7  PLT 329 262 245 219 194   Basic Metabolic Panel:  Recent Labs Lab 12/23/16 0751 12/24/16 0518 12/25/16 0414 12/26/16 0546 12/27/16 0451  NA 144 145 145 141 138  K 4.2 3.7 3.7 3.6 3.8  CL 114* 114* 113* 109 109   CO2 18* 17* 23 24 23   GLUCOSE 147* 124* 148* 165* 147*  BUN 21* 22* 13 10 8   CREATININE 1.61* 0.94 0.70 0.61 0.58  CALCIUM 7.9* 8.3* 8.4* 8.4* 8.2*  MG 1.2* 2.0 1.6* 1.5* 1.7  PHOS  --   --  2.2* 3.5 3.5   GFR: Estimated Creatinine Clearance: 67.5 mL/min (by C-G formula based on SCr of 0.58 mg/dL). Liver Function Tests:  Recent Labs Lab 12/22/16 0141 12/23/16 0751 12/25/16 0414 12/27/16 0451  AST 37 22 22 23   ALT 22 17 14  13*  ALKPHOS 59 41 43 43  BILITOT 0.8 1.1 0.7 0.3  PROT 6.9 5.9* 5.7* 5.8*  ALBUMIN 2.7* 2.3* 1.9* 1.9*   No results for input(s): LIPASE, AMYLASE in the last 168 hours. No results for input(s): AMMONIA in the last 168 hours. Coagulation Profile: No results for input(s): INR, PROTIME in the last 168 hours. Cardiac Enzymes: No results for input(s): CKTOTAL, CKMB, CKMBINDEX, TROPONINI in the last 168 hours. BNP (last 3 results) No results for input(s): PROBNP in the last 8760 hours. HbA1C: No results for input(s): HGBA1C in the last 72 hours. CBG:  Recent Labs Lab 12/26/16 2008 12/27/16 0008 12/27/16 0413 12/27/16 0752 12/27/16 1135  GLUCAP 169* 121* 154* 127* 145*   Lipid Profile:  Recent Labs  12/25/16 0414 12/27/16 0451  TRIG 146 153*   Thyroid Function Tests: No results for input(s): TSH, T4TOTAL, FREET4, T3FREE, THYROIDAB in the last 72 hours. Anemia Panel: No results for input(s): VITAMINB12, FOLATE, FERRITIN, TIBC, IRON, RETICCTPCT in the last 72 hours. Sepsis Labs:  Recent Labs Lab 12/22/16 0141 12/22/16 0623 12/22/16 0806 12/23/16 0920  LATICACIDVEN 3.1* 3.3* 3.3* 1.8    Recent Results (from the past 240 hour(s))  Culture, blood (routine x 2) Call MD if unable to obtain prior to antibiotics being given     Status: None   Collection Time: 12/22/16 11:13 AM  Result Value Ref Range Status   Specimen Description BLOOD LEFT HAND  Final   Special Requests IN PEDIATRIC BOTTLE 1CC  Final   Culture NO GROWTH 5 DAYS  Final    Report Status 12/27/2016 FINAL  Final  Culture, blood (routine x 2) Call MD if unable to obtain prior to antibiotics being given     Status: None   Collection Time: 12/22/16 11:24 AM  Result Value Ref Range Status   Specimen Description BLOOD LEFT HAND  Final   Special Requests IN PEDIATRIC BOTTLE 3CC  Final   Culture NO GROWTH 5 DAYS  Final   Report Status 12/27/2016 FINAL  Final  Urine culture     Status: None   Collection Time: 12/23/16 11:41 PM  Result Value Ref Range Status   Specimen Description URINE, RANDOM  Final   Special Requests NONE  Final   Culture NO GROWTH  Final   Report Status 12/25/2016 FINAL  Final         Radiology Studies: No results found.  Scheduled Meds: . enoxaparin (LOVENOX) injection  40 mg Subcutaneous Q24H  . insulin aspart  0-9 Units Subcutaneous Q4H  . magnesium sulfate 1 - 4 g bolus IVPB  6 g Intravenous Once  . mouth rinse  15 mL Mouth Rinse BID  . piperacillin-tazobactam (ZOSYN)  IV  3.375 g Intravenous Q8H  . potassium chloride  40 mEq Per Tube Daily  . potassium chloride (KCL MULTIRUN) 30 mEq in 265 mL IVPB  30 mEq Intravenous Once   Continuous Infusions: . dextrose 5 % and 0.45% NaCl 50 mL/hr at 12/27/16 0432  . dextrose 5 % and 0.45% NaCl    . Marland KitchenTPN (CLINIMIX-E) Adult     And  . fat emulsion    . Marland KitchenTPN (CLINIMIX-E) Adult 40 mL/hr at 12/26/16 1736     LOS: 19 days    Time spent: 35 mins    Shivaun Bilello, MD [facility] Pager [phone]  If 7PM-7AM, please contact night-coverage [email or web address] Password [email or web address] 12/27/2016, 1:53 PM

## 2016-12-27 NOTE — Progress Notes (Signed)
[facility] Progress Note  5 Days Post-Op  Subjective: Working with PT. States that her abdominal pain is the same as yesterday. Not passing flatus. No BM yet.  Objective: Vital signs in last 24 hours: Temp:  [98.4 F (36.9 C)-99.4 F (37.4 C)] 98.7 F (37.1 C) (02/12 1000) Pulse Rate:  [69-81] 75 (02/12 1000) Resp:  [14-18] 17 (02/12 1000) BP: (94-149)/(41-70) 143/51 (02/12 1000) SpO2:  [98 %-100 %] 98 % (02/12 1000) Weight:  [102.4 kg (225 lb 12 oz)] 102.4 kg (225 lb 12 oz) (02/12 0448) Last BM Date: 12/21/16  Intake/Output from previous day: 02/11 0701 - 02/12 0700 In: 2266 [I.V.:1944; NG/GT:60; IV Piggyback:262] Out: 2575 [Urine:1725; Emesis/NG output:825; Drains:25] Intake/Output this shift: No intake/output data recorded.  PE: Gen:  Alert, NAD, pleasant Pulm:  CTAB, effort normal Abd: Soft, mild distension, appropriately tender, +BS, incision C/D/I with staples intact >> honeycomb dressing removed, drain with minimal sanguinous drainage  Lab Results:   Recent Labs  12/25/16 0414 12/27/16 0451  WBC 6.6 8.5  HGB 8.5* 9.1*  HCT 28.2* 28.9*  PLT 219 194   BMET  Recent Labs  12/26/16 0546 12/27/16 0451  NA 141 138  K 3.6 3.8  CL 109 109  CO2 24 23  GLUCOSE 165* 147*  BUN 10 8  CREATININE 0.61 0.58  CALCIUM 8.4* 8.2*   PT/INR No results for input(s): LABPROT, INR in the last 72 hours. CMP     Component Value Date/Time   NA 138 12/27/2016 0451   K 3.8 12/27/2016 0451   CL 109 12/27/2016 0451   CO2 23 12/27/2016 0451   GLUCOSE 147 (H) 12/27/2016 0451   BUN 8 12/27/2016 0451   CREATININE 0.58 12/27/2016 0451   CALCIUM 8.2 (L) 12/27/2016 0451   PROT 5.8 (L) 12/27/2016 0451   ALBUMIN 1.9 (L) 12/27/2016 0451   AST 23 12/27/2016 0451   ALT 13 (L) 12/27/2016 0451   ALKPHOS 43 12/27/2016 0451   BILITOT 0.3 12/27/2016 0451   GFRNONAA >60 12/27/2016 0451   GFRAA >60 12/27/2016 0451   Lipase     Component Value Date/Time   LIPASE 61  (H) 12/08/2016 5621       Studies/Results: No results found.  Anti-infectives: Anti-infectives    Start     Dose/Rate Route Frequency Ordered Stop   12/22/16 1100  piperacillin-tazobactam (ZOSYN) IVPB 3.375 g     3.375 g 12.5 mL/hr over 240 Minutes Intravenous Every 8 hours 12/22/16 1026     12/08/16 1200  piperacillin-tazobactam (ZOSYN) IVPB 3.375 g  Status:  Discontinued     3.375 g 12.5 mL/hr over 240 Minutes Intravenous Every 8 hours 12/08/16 0717 12/11/16 0846   12/08/16 0345  vancomycin (VANCOCIN) IVPB 1000 mg/200 mL premix     1,000 mg 200 mL/hr over 60 Minutes Intravenous  Once 12/08/16 0333 12/08/16 0740   12/08/16 0345  piperacillin-tazobactam (ZOSYN) IVPB 3.375 g     3.375 g 100 mL/hr over 30 Minutes Intravenous  Once 12/08/16 3086 12/08/16 0741       Assessment/Plan Incarcerated possible strangulated ventral hernia with small bowel obstruction S/p exploratory laparotomy with primary ventral hernia repair, 12/22/16, DR. Cornett - POD 5 - Post op ileus, await return of bowel function - WBC 8.5  Probable aspiration pneumonia - on zosyn day 5/7 VDRF 1/24-27/2018 Acute kidney injury Anemia Dementia Malnutrition - now on TPN (prealbumin 8.0 on 2/12)  FEN: IV fluids/NPO/TPN ID: Zosyn 12/22/16 >> (day #5/7) DVT: SCD/Lovenox  Plan:Await return of bowel function. Continue NPO and NGT to LIWS. Ok for hardy candy. Continue TPN.  Continue to encourage Pt/mobilization.    LOS: 19 days    Edson SnowballBROOKE A MILLER , Md [facility] 12/27/2016, 10:55 AM Pager: [phone] Consults: [phone] Mon-Fri 7:00 am-4:30 pm Sat-Sun 7:00 am-11:30 am

## 2016-12-28 DIAGNOSIS — Z515 Encounter for palliative care: Secondary | ICD-10-CM

## 2016-12-28 DIAGNOSIS — Z7189 Other specified counseling: Secondary | ICD-10-CM

## 2016-12-28 LAB — BASIC METABOLIC PANEL
ANION GAP: 6 (ref 5–15)
Anion gap: 7 (ref 5–15)
BUN: 10 mg/dL (ref 6–20)
BUN: 13 mg/dL (ref 6–20)
CALCIUM: 8.1 mg/dL — AB (ref 8.9–10.3)
CO2: 22 mmol/L (ref 22–32)
CO2: 25 mmol/L (ref 22–32)
Calcium: 8.4 mg/dL — ABNORMAL LOW (ref 8.9–10.3)
Chloride: 102 mmol/L (ref 101–111)
Chloride: 104 mmol/L (ref 101–111)
Creatinine, Ser: 0.64 mg/dL (ref 0.44–1.00)
Creatinine, Ser: 0.68 mg/dL (ref 0.44–1.00)
GFR calc Af Amer: >60 mL/min (ref >60)
GLUCOSE: 142 mg/dL — AB (ref 65–99)
Glucose, Bld: 560 mg/dL (ref 65–99)
Potassium: 5.2 mmol/L — ABNORMAL HIGH (ref 3.5–5.1)
Potassium: 4.5 mmol/L (ref 3.5–5.1)
SODIUM: 130 mmol/L — AB (ref 135–145)
Sodium: 136 mmol/L (ref 135–145)

## 2016-12-28 LAB — CBC
HCT: 29.2 % — ABNORMAL LOW (ref 36.0–46.0)
Hemoglobin: 9 g/dL — ABNORMAL LOW (ref 12.0–15.0)
MCH: 31.8 pg (ref 26.0–34.0)
MCHC: 30.8 g/dL (ref 30.0–36.0)
MCV: 103.2 fL — ABNORMAL HIGH (ref 78.0–100.0)
Platelets: 188 10*3/uL (ref 150–400)
RBC: 2.83 MIL/uL — ABNORMAL LOW (ref 3.87–5.11)
RDW: 15.7 % — AB (ref 11.5–15.5)
WBC: 9.4 10*3/uL (ref 4.0–10.5)

## 2016-12-28 LAB — GLUCOSE, CAPILLARY
GLUCOSE-CAPILLARY: 157 mg/dL — AB (ref 65–99)
GLUCOSE-CAPILLARY: 143 mg/dL — AB (ref 65–99)
Glucose-Capillary: 138 mg/dL — ABNORMAL HIGH (ref 65–99)
Glucose-Capillary: 132 mg/dL — ABNORMAL HIGH (ref 65–99)
Glucose-Capillary: 121 mg/dL — ABNORMAL HIGH (ref 65–99)
Glucose-Capillary: 124 mg/dL — ABNORMAL HIGH (ref 65–99)

## 2016-12-28 LAB — MAGNESIUM: MAGNESIUM: 2.2 mg/dL (ref 1.7–2.4)

## 2016-12-28 MED ORDER — PIPERACILLIN-TAZOBACTAM 3.375 G IVPB
3.3750 g | Freq: Three times a day (TID) | INTRAVENOUS | Status: AC
  Administered 2016-12-28 – 2016-12-29 (×3): 3.375 g via INTRAVENOUS
  Filled 2016-12-28 (×3): qty 50

## 2016-12-28 MED ORDER — FAT EMULSION 20 % IV EMUL
240.0000 mL | INTRAVENOUS | Status: AC
  Administered 2016-12-28: 240 mL via INTRAVENOUS
  Filled 2016-12-28: qty 250

## 2016-12-28 MED ORDER — FAMOTIDINE IN NACL 20-0.9 MG/50ML-% IV SOLN: 20.0000 mg | Freq: Two times a day (BID) | INTRAVENOUS | Status: DC

## 2016-12-28 MED ORDER — FAMOTIDINE 200 MG/20ML IV SOLN
INTRAVENOUS | Status: AC
  Administered 2016-12-28: 19:00:00 via INTRAVENOUS
  Filled 2016-12-28: qty 1440

## 2016-12-28 MED ORDER — FAMOTIDINE 200 MG/20ML IV SOLN
INTRAVENOUS | Status: DC
  Filled 2016-12-28: qty 1440

## 2016-12-28 MED ORDER — FAT EMULSION 20 % IV EMUL
240.0000 mL | INTRAVENOUS | Status: DC
  Filled 2016-12-28: qty 250

## 2016-12-28 NOTE — Progress Notes (Signed)
PROGRESS NOTE    Adrienne [name]  WGN:[id number] DOB: May 05, 1936 DOA: 12/08/2016 PCP: [name], MD   Brief Narrative:  [name] with pmhx significant for DM, HTN, OSA, and dementia who presented from her nursing home with abdominal distention and respiratory distress. Patient was found to have ileus vs SBO, high lactic acid. She was intubated on 1/24 to 1/27. Transferred to [facility] from [facility] on 12/13/2016. Patient improved clinically and repeat x-ray showed improvement with small bowel obstruction. Patient also noted to have diarrhea and had rectal tube placed. C. difficile PCR was negative. Abdominal pain had improved. Patient was placed on a diet which she was tolerating. Patient was to be discharged to the skilled nursing facility on 12/21/2016 however developed abdominal pain with significant emesis and discharge was canceled. KUB done was concerning for gastric outlet obstruction. CT abdomen and pelvis done 12/22/2016 was concerning for bowel strangulation and possible atelectasis and pneumonia. Discharge canceled. Patient kept nothing by mouth NG tube in place. General surgery consulted for further evaluation. Patient placed empirically on IV Zosyn.    Assessment & Plan:   Principal Problem:   Strangulation of intestine (HCC) Active Problems:   Postoperative ileus (HCC)   SBO (small bowel obstruction)   Acute on chronic respiratory failure with hypoxemia (HCC)   Acute renal failure (HCC)   Elevated lactic acid level   Metabolic acidosis   Ileus (HCC)   Ventilator dependent (HCC)   Pressure injury of skin   Oral phase dysphagia   DNR (do not resuscitate) discussion   Palliative care by specialist   Abdominal distention   Leukocytosis   Aspiration pneumonia of left lung (HCC)   Goals of care, counseling/discussion   Palliative care encounter  #1 incarcerated possible strangulation of ventral hernia with small bowel obstruction/postoperative ileus Patient was to be  discharged to skilled nursing facility on 12/21/2016, however while awaiting transportation patient did have significant emesis with abdominal pain and a such discharge was canceled. NG tube was placed. KUB obtained on 12/21/2016 showed market gaseous dilatation of the stomach, increased compared to prior, outlet obstruction cannot be excluded. Patient was placed on bowel rest CT abdomen and pelvis was subsequently obtained on the morning of 12/22/2016 which showed ventral hernia containing bowel in addition to fluid and omental adipose tissue, there is portal venous gas in both this hernia and intra-abdominal portal veins in the liver concerning for possible bowel necrosis particularly involving the herniated segment. New peri-hepatic ascites. Lactic acid level is elevated at 3.3. Patient with a leukocytosis with a white count of 15.9. Patient status post exploratory laparotomy with repair primary ventral hernia (Dr. Luisa Hart 12/22/2016). Patient kept nothing by mouth. NG tube in place. Continue IV Zosyn.  Patient likely now with a postop ileus. Patient with no oral intake since admission. PICC line placed and patient started on TNA 12/24/2016. General surgery following and appreciate input and recommendations.  #2 probable aspiration pneumonia versus healthcare associated pneumonia CT abdomen and pelvis with mild progressive airspace opacity medially in the left lung likely combination of atelectasis and pneumonia. Patient with significant emesis just prior to discharge and concern for possible early aspiration pneumonia. Blood cultures pending. Sputum Gram stain and culture pending. Urine Legionella antigen negative. Urine pneumococcus antigen negative. WBC trending down. Continue empiric IV Zosyn day #6/7.  #3 HTN Patient had borderline blood pressure with systolic blood pressure in the 90s. Patient also noted with elevated lactic acid. Concern for bowel strangulation per CT abdomen and  pelvis. Patient  status post exploratory laparotomy with ventral hernia repair. Blood pressure improved with hydration. Continue to hold antihypertensive medications. Follow.   #4 leukocytosis Likely secondary to problem #1 and 2. WBC trending down. Sputum Gram stain and culture pending. Urine strep pneumococcus antigen negative. Urine Legionella antigen negative. Blood cultures pending with no growth to date.. Continue empiric IV Zosyn D6/7.   #5 VDRF Early on in the hospitalization patient had to be intubated from 1/24 - 12/11/2016. Patient extubated around doing fine on nasal cannula. Follow.  #6 acute kidney injury Renal function improved. Avoid nephrotoxins.  #7 hypokalemia/hypomagnesemia Potassium level and glucose level elevated. Repeat basic metabolic profile.  #8 acute blood loss anemia/iron deficiency anemia Hemoglobin of 9.0. Likely dilutional effect. No overt bleeding. Anemia panel consistent with iron deficiency anemia. s/p IV iron. Follow H&H. Transfusion threshold hemoglobin less than 8.  #8 dementia without behavioral disturbance Stable. Patient when medically stable will discharge to skilled nursing facility. Patient will need palliative care to follow-up in the outpatient setting.    DVT prophylaxis: SCDs Code Status: Full Family Communication: Updated patient. No family at bedside. Disposition Plan: Remain in-house. Likely to skilled nursing facility once bowels start to move an NG tube has been removed and patient tolerating a diet and off TNA. Per general surgery.   Consultants:   General surgery: Dr.Tsuei 12/08/2016  Wound care  Palliative care Lorinda Creed, NP 12/19/2016  Procedures:   CT abdomen and pelvis 12/08/2006, 12/22/2016  Abdominal films 12/08/2016, 12/09/2016, 12/10/2016, 12/11/2016, 12/15/2016, 12/19/2016, 12/21/2016, 12/22/2016,  Chest x-ray 12/12/2016, 12/10/2016, 12/09/2016, 12/08/2016  Exploratory laparotomy with repair of ventral  hernia.  Antimicrobials:  IV Zosyn 12/22/2016>>>>>12/29/2016     Subjective: Patient laying in bed complaining of abdominal pain. No chest pain. No shortness of breath. No emesis. No bowel movement. Patient states passing flatus.   Objective: Vitals:   12/27/16 1650 12/27/16 2030 12/28/16 0411 12/28/16 0915  BP: 137/65 (!) 131/47 (!) 141/53 (!) 136/52  Pulse: 78 80 73 72  Resp: 17 18 20    Temp: 98.3 F (36.8 C) 98.9 F (37.2 C) 98.2 F (36.8 C) 98.2 F (36.8 C)  TempSrc: Oral   Oral  SpO2: 98% 98% 98% 99%  Weight:  98 kg (216 lb)    Height:        Intake/Output Summary (Last 24 hours) at 12/28/16 1444 Last data filed at 12/28/16 1309  Gross per 24 hour  Intake             1596 ml  Output              437 ml  Net             1159 ml   Filed Weights   12/26/16 2120 12/27/16 0448 12/27/16 2030  Weight: 102.4 kg (225 lb 12 oz) 102.4 kg (225 lb 12 oz) 98 kg (216 lb)    Examination:  General exam: NGT in place. Alert. In bed. Respiratory system: Clear to auscultation anterior lung fields. Respiratory effort normal. Cardiovascular system: S1 & S2 heard, RRR. No JVD, murmurs, rubs, gallops or clicks. No pedal edema. Gastrointestinal system: Abdomen is soft and positive bowel sounds. Less Distended. Abdominal binder in place. Central nervous system: Alert and oriented. No focal neurological deficits. Extremities: Symmetric 5 x 5 power. Skin: No rashes, lesions or ulcers Psychiatry: Judgement and insight appear normal. Mood & affect appropriate.     Data Reviewed: I have personally reviewed following labs and imaging studies  CBC:  Recent Labs Lab 12/22/16 0141 12/23/16 [id number] 12/24/16 0518 12/25/16 0414 12/27/16 0451 12/28/16 0432  WBC 15.9* 7.4 8.2 6.6 8.5 9.4  NEUTROABS 13.6* 4.6  --  3.9 4.9  --   HGB 11.8* 9.4* 9.3* 8.5* 9.1* 9.0*  HCT 37.4 30.2* 30.5* 28.2* 28.9* 29.2*  MCV 98.9 101.0* 102.3* 102.5* 99.7 103.2*  PLT 329 262 245 219 194 188   Basic  Metabolic Panel:  Recent Labs Lab 12/24/16 0518 12/25/16 0414 12/26/16 0546 12/27/16 0451 12/28/16 0432 12/28/16 1020  NA 145 145 141 138 130* 136  K 3.7 3.7 3.6 3.8 5.2* 4.5  CL 114* 113* 109 109 102 104  CO2 17* 23 24 23 22 25   GLUCOSE 124* 148* 165* 147* 560* 142*  BUN 22* 13 10 8 10 13   CREATININE 0.94 0.70 0.61 0.58 0.64 0.68  CALCIUM 8.3* 8.4* 8.4* 8.2* 8.1* 8.4*  MG 2.0 1.6* 1.5* 1.7 2.2  --   PHOS  --  2.2* 3.5 3.5  --   --    GFR: Estimated Creatinine Clearance: 65.9 mL/min (by C-G formula based on SCr of 0.68 mg/dL). Liver Function Tests:  Recent Labs Lab 12/22/16 0141 12/23/16 0751 12/25/16 0414 12/27/16 0451  AST 37 22 22 23   ALT 22 17 14  13*  ALKPHOS 59 41 43 43  BILITOT 0.8 1.1 0.7 0.3  PROT 6.9 5.9* 5.7* 5.8*  ALBUMIN 2.7* 2.3* 1.9* 1.9*   No results for input(s): LIPASE, AMYLASE in the last 168 hours. No results for input(s): AMMONIA in the last 168 hours. Coagulation Profile: No results for input(s): INR, PROTIME in the last 168 hours. Cardiac Enzymes: No results for input(s): CKTOTAL, CKMB, CKMBINDEX, TROPONINI in the last 168 hours. BNP (last 3 results) No results for input(s): PROBNP in the last 8760 hours. HbA1C: No results for input(s): HGBA1C in the last 72 hours. CBG:  Recent Labs Lab 12/27/16 2357 12/28/16 0409 12/28/16 0559 12/28/16 0744 12/28/16 1133  GLUCAP 131* 138* 157* 143* 132*   Lipid Profile:  Recent Labs  12/27/16 0451  TRIG 153*   Thyroid Function Tests: No results for input(s): TSH, T4TOTAL, FREET4, T3FREE, THYROIDAB in the last 72 hours. Anemia Panel: No results for input(s): VITAMINB12, FOLATE, FERRITIN, TIBC, IRON, RETICCTPCT in the last 72 hours. Sepsis Labs:  Recent Labs Lab 12/22/16 0141 12/22/16 0623 12/22/16 0806 12/23/16 0920  LATICACIDVEN 3.1* 3.3* 3.3* 1.8    Recent Results (from the past 240 hour(s))  Culture, blood (routine x 2) Call MD if unable to obtain prior to antibiotics being  given     Status: None   Collection Time: 12/22/16 11:13 AM  Result Value Ref Range Status   Specimen Description BLOOD LEFT HAND  Final   Special Requests IN PEDIATRIC BOTTLE 1CC  Final   Culture NO GROWTH 5 DAYS  Final   Report Status 12/27/2016 FINAL  Final  Culture, blood (routine x 2) Call MD if unable to obtain prior to antibiotics being given     Status: None   Collection Time: 12/22/16 11:24 AM  Result Value Ref Range Status   Specimen Description BLOOD LEFT HAND  Final   Special Requests IN PEDIATRIC BOTTLE 3CC  Final   Culture NO GROWTH 5 DAYS  Final   Report Status 12/27/2016 FINAL  Final  Urine culture     Status: None   Collection Time: 12/23/16 11:41 PM  Result Value Ref Range Status   Specimen Description URINE, RANDOM  Final  Special Requests NONE  Final   Culture NO GROWTH  Final   Report Status 12/25/2016 FINAL  Final         Radiology Studies: No results found.      Scheduled Meds: . enoxaparin (LOVENOX) injection  40 mg Subcutaneous Q24H  . insulin aspart  0-9 Units Subcutaneous Q4H  . mouth rinse  15 mL Mouth Rinse BID  . piperacillin-tazobactam (ZOSYN)  IV  3.375 g Intravenous Q8H   Continuous Infusions: . Marland KitchenTPN (CLINIMIX-E) Adult     And  . fat emulsion    . Marland KitchenTPN (CLINIMIX-E) Adult 60 mL/hr at 12/27/16 1711     LOS: 20 days    Time spent: 35 mins    Raelin Pixler, MD [facility] Pager [phone]  If 7PM-7AM, please contact night-coverage [email or web address] Password [facility] 12/28/2016, 2:44 PM

## 2016-12-28 NOTE — Progress Notes (Signed)
Patient ID: Adrienne [name], female   DOB: Apr 04, 1936, 81 y.o.   MRN: [id number]  [facility]  6 Days Post-Op  Subjective: Patient lying in bed. She reports some right sided abdominal pain, about the same as yesterday. States that she has passed a small amount of flatus. No BM.   Objective: Vital signs in last 24 hours: Temp:  [98.2 F (36.8 C)-98.9 F (37.2 C)] 98.2 F (36.8 C) (02/13 0411) Pulse Rate:  [73-80] 73 (02/13 0411) Resp:  [17-20] 20 (02/13 0411) BP: (131-143)/(47-65) 141/53 (02/13 0411) SpO2:  [98 %] 98 % (02/13 0411) Weight:  [216 lb (98 kg)] 216 lb (98 kg) (02/12 2030) Last BM Date: 12/22/16  Intake/Output from previous day: 02/12 0701 - 02/13 0700 In: 1656 [I.V.:1496; NG/GT:60; IV Piggyback:100] Out: 735 [Urine:300; Emesis/NG output:400; Drains:35] Intake/Output this shift: No intake/output data recorded.  PE: Gen:  Alert, NAD, pleasant Pulm:  CTAB, effort normal Abd: Soft, mild distension, TTP right sided abdomen, +BS, incision C/D/I with staples intact, drain with minimal sanguinous drainage  Lab Results:   Recent Labs  12/27/16 0451 12/28/16 0432  WBC 8.5 9.4  HGB 9.1* 9.0*  HCT 28.9* 29.2*  PLT 194 188   BMET  Recent Labs  12/27/16 0451 12/28/16 0432  NA 138 130*  K 3.8 5.2*  CL 109 102  CO2 23 22  GLUCOSE 147* 560*  BUN 8 10  CREATININE 0.58 0.64  CALCIUM 8.2* 8.1*   PT/INR No results for input(s): LABPROT, INR in the last 72 hours. CMP     Component Value Date/Time   NA 130 (L) 12/28/2016 0432   K 5.2 (H) 12/28/2016 0432   CL 102 12/28/2016 0432   CO2 22 12/28/2016 0432   GLUCOSE 560 (HH) 12/28/2016 0432   BUN 10 12/28/2016 0432   CREATININE 0.64 12/28/2016 0432   CALCIUM 8.1 (L) 12/28/2016 0432   PROT 5.8 (L) 12/27/2016 0451   ALBUMIN 1.9 (L) 12/27/2016 0451   AST 23 12/27/2016 0451   ALT 13 (L) 12/27/2016 0451   ALKPHOS 43 12/27/2016 0451   BILITOT 0.3 12/27/2016 0451   GFRNONAA >60  12/28/2016 0432   GFRAA >60 12/28/2016 0432   Lipase     Component Value Date/Time   LIPASE 61 (H) 12/08/2016 [id number]       Studies/Results: No results found.  Anti-infectives: Anti-infectives    Start     Dose/Rate Route Frequency Ordered Stop   12/22/16 1100  piperacillin-tazobactam (ZOSYN) IVPB 3.375 g     3.375 g 12.5 mL/hr over 240 Minutes Intravenous Every 8 hours 12/22/16 1026     12/08/16 1200  piperacillin-tazobactam (ZOSYN) IVPB 3.375 g  Status:  Discontinued     3.375 g 12.5 mL/hr over 240 Minutes Intravenous Every 8 hours 12/08/16 0717 12/11/16 0846   12/08/16 0345  vancomycin (VANCOCIN) IVPB 1000 mg/200 mL premix     1,000 mg 200 mL/hr over 60 Minutes Intravenous  Once 12/08/16 0333 12/08/16 0740   12/08/16 0345  piperacillin-tazobactam (ZOSYN) IVPB 3.375 g     3.375 g 100 mL/hr over 30 Minutes Intravenous  Once 12/08/16 [id number] 12/08/16 0741       Assessment/Plan Incarcerated possible strangulated ventral hernia with small bowel obstruction S/p exploratory laparotomy with primary ventral hernia repair, 12/22/16, DR. Cornett - POD 6 - Post op ileus, await return of bowel function - NG 400cc/24hr - WBC 9.4  Probable aspiration pneumonia - on zosyn VDRF 1/24-27/2018 Acute kidney injury Anemia  Dementia Malnutrition - now on TPN (prealbumin 8.0 on 2/12)  FEN: IV fluids/NPO/TPN ID: Zosyn 12/22/16 >> (day #6/7) DVT: SCD/Lovenox  Plan:Small amount of flatus today, await return of bowel function. Continue NPO and NGT to LIWS. Electrolytes per IM/TPN. Continue TPN.  Continue to encourage PT/mobilization.    LOS: 20 days    Edson Snowball , [facility] 12/28/2016, 9:43 AM Pager: [phone] Consults: [phone] Mon-Fri 7:00 am-4:30 pm Sat-Sun 7:00 am-11:30 am

## 2016-12-28 NOTE — Consult Note (Addendum)
WOC Nurse wound consult note Reason for Consult: Consult requested for buttocks.  Pt is frequently incontinent of large amt urine, and a dressing to buttocks wounds would become soiled and trap moisture against skin. Wound type: Patchy areas of full thickness skin loss to inner gluteal fold related to moisture associated skin damage. Pressure Injury POA: This was present on admission and is NOT a pressure injury; site is not located over a bony prominence Measurement: Affected area is 8X2X.1cm in patchy areas Wound bed: 90% red and moist, 10% dark reddish purple near 12:00 o'clock of wound bed Drainage (amount, consistency, odor) No odor or drainage Periwound: Intact skin surrounding Dressing procedure/placement/frequency: Barrier cream to protect skin and repel moisture.  Pt is on an air mattress to decrease pressure.  Air cushion ordered for use when pt is OOB to chair. Discussed plan of care with patient and she verbalized understanding. Please re-consult if further assistance is needed.  Thank-you,  Cammie Mcgeeawn Miriana Gaertner MSN, RN, CWOCN, [place], CNS [phone]

## 2016-12-28 NOTE — Progress Notes (Addendum)
PHARMACY - ADULT TOTAL PARENTERAL NUTRITION CONSULT NOTE   Pharmacy Consult for TPN Indication: prolonged ileus Patient Measurements: Height: 5\' 4"  (162.6 cm) Weight: 216 lb (98 kg) IBW/kg (Calculated) : 54.7 TPN AdjBW (KG): 66 Body mass index is 37.08 kg/m.   Assessment: [name] admitted on 12/08/16.  POD #6. Pharmacy consulted to dose TPN for prolonged ileus, started on 2/9.    GI: Incarcerated possible strangulated ventral hernia with small bowel obstruction. S/p exploratory laparotomy with primary ventral hernia repair, 12/22/16. Post op ileus; NG output 400/24 hrs, drain 35/24 hrs; last BM 2/6 Abdominal pain 2/11, per tube meds held   Pepcid per tube > change to TPN Endo: DM PTA (on metformin PTA), on SSI, CBGs most < 160, pt had one glucose this morning of 560 > watch trend before adjusting insulin  Insulin requirements in the past 24 hours: 6 Lytes: K 3.8 > 5.2, CoCa 9.8, Mg 1.7 > 2.2, Na 130 Renal: SCr 0.6, D545 at 40 ml/hr > to stop today Pulm:OSA, VDRF 1/24- 12/22/16, now 2L [place] Cards: HTN, on metop 12.5 per tube bid Hepatobil:alb 1.9, LFTs WNL, Trig 145 on 2/10 - on fenofibrate PTA Neuro:dementia WG:NFAOZHYQ:possible asp PNA, WBC WNL, Zosyn day #7 Zosyn 2/7>>  2/7  BC x2 >>ngtd 2/8 Ucx NGF 1/28 C diff neg HIV neg, legionella urine neg, strep pneumo urine neg  Best Practices: LMWH, pepcid per tube TPN Access: PICC placed for TPN 2/9 TPN start date: 2/9  Nutritional Goals  per RD recommendation on 2/9: KCal: 1750-1950 Protein: 85 - 95 g  Current Nutrition:  TPN at 40 with lipids D5-1/2NS @ 40 ml/hr  Plan:  -Continue Clinimix 5/15 at 60 ml/hr, hold electrolytes given K+ today -Continue 20% lipid emulsion at 20 ml/hr -Hold on advancing TPN given multiple electrolyte abnormalities -Stop maintenance fluids -This provides 72 g of protein and 1502 kCals per day meeting 85% of protein and 86% of kCal needs -Add Pepcid in TPN -Trace elements and MVI every other day, next  2/14 -Monitor TPN labs, watch lytes -F/u duration of zosyn   Agapito GamesAlison Klaryssa Fauth, PharmD, BCPS Clinical Pharmacist 12/28/2016 9:02 AM    Addendum -0430 BMET likely drawn through the line, repeat BMET looks more consistent with patient trend -Will keep everything re: TPN the same, except change the bag to Clinimix E 5/15 at 60 ml/hr with Pepcid -F/u BMET tomorrow morning   Baldemar FridayMasters, Kalmen Lollar M  12/28/2016 11:35 AM

## 2016-12-28 NOTE — Clinical Social Work Note (Signed)
CSW continuing to monitor patient's progress and [facility] on 2/12 and she can receive patient whenever ready as patient is a bed hold.  Genelle BalVanessa Renee Beale, MSW, LCSW Licensed Clinical Social Worker Clinical Social Work Department [facility] [phone]

## 2016-12-29 ENCOUNTER — Inpatient Hospital Stay (HOSPITAL_COMMUNITY): Payer: Medicare Other

## 2016-12-29 LAB — BASIC METABOLIC PANEL
ANION GAP: 9 (ref 5–15)
BUN: 13 mg/dL (ref 6–20)
CO2: 25 mmol/L (ref 22–32)
Calcium: 8.3 mg/dL — ABNORMAL LOW (ref 8.9–10.3)
Chloride: 102 mmol/L (ref 101–111)
Creatinine, Ser: 0.65 mg/dL (ref 0.44–1.00)
GFR calc Af Amer: >60 mL/min (ref >60)
GLUCOSE: 165 mg/dL — AB (ref 65–99)
POTASSIUM: 4 mmol/L (ref 3.5–5.1)
Sodium: 136 mmol/L (ref 135–145)

## 2016-12-29 LAB — GLUCOSE, CAPILLARY
GLUCOSE-CAPILLARY: 134 mg/dL — AB (ref 65–99)
GLUCOSE-CAPILLARY: 121 mg/dL — AB (ref 65–99)
GLUCOSE-CAPILLARY: 126 mg/dL — AB (ref 65–99)
Glucose-Capillary: 116 mg/dL — ABNORMAL HIGH (ref 65–99)
Glucose-Capillary: 151 mg/dL — ABNORMAL HIGH (ref 65–99)
Glucose-Capillary: 160 mg/dL — ABNORMAL HIGH (ref 65–99)

## 2016-12-29 LAB — CBC
HEMATOCRIT: 28.3 % — AB (ref 36.0–46.0)
HEMOGLOBIN: 9 g/dL — AB (ref 12.0–15.0)
MCH: 32 pg (ref 26.0–34.0)
MCHC: 31.8 g/dL (ref 30.0–36.0)
MCV: 100.7 fL — ABNORMAL HIGH (ref 78.0–100.0)
Platelets: 165 10*3/uL (ref 150–400)
RBC: 2.81 MIL/uL — AB (ref 3.87–5.11)
RDW: 16.1 % — ABNORMAL HIGH (ref 11.5–15.5)
WBC: 8.7 10*3/uL (ref 4.0–10.5)

## 2016-12-29 MED ORDER — FAT EMULSION 20 % IV EMUL
240.0000 mL | INTRAVENOUS | Status: AC
  Administered 2016-12-29: 240 mL via INTRAVENOUS
  Filled 2016-12-29: qty 250

## 2016-12-29 MED ORDER — CLINIMIX E/DEXTROSE (5/15) 5 % IV SOLN
80.0000 mL/h | INTRAVENOUS | Status: AC
  Administered 2016-12-29: 18:00:00 via INTRAVENOUS
  Filled 2016-12-29: qty 1920

## 2016-12-29 NOTE — Progress Notes (Signed)
Physical Therapy Treatment Patient Details Name: Adrienne [name] MRN: [id number] DOB: April 26, 1936 Today's Date: 12/29/2016    History of Present Illness 81 yo female admitted with distended abdomen/nausea, ileus vs SBO, vent d/c 12/11/16. Acute respiratory failure with hypoxia: Required mechanical ventilator  Exploratory laparotomy with repair primary a ventral hernia 2/7.    PT Comments    Pt up in recliner upon arrival via maxi-move by nursing.  Pt lethargic today and only opened eyes 2x during session despite cueing.  Facilitated core strengthening with unsupported sitting with pt grimacing. Also, performed LE AAROM/PROM. Pt seen first thing in AM, and wonder if she does better later in the day in terms of her alertness.  Con't to recommend SNF.  Follow Up Recommendations  SNF     Equipment Recommendations  None recommended by PT    Recommendations for Other Services       Precautions / Restrictions Precautions Precautions: Fall Precaution Comments: NG tube/abdominal binder. JP drain Restrictions Weight Bearing Restrictions: No    Mobility  Bed Mobility               General bed mobility comments: Pt up in recliner upon arrival shifted to the L with decreased LE support  Transfers                 General transfer comment: Pt very lethargic and required TOT A to re-position in chair with use of pads.  Worked on sitting up right for trunk strength and pt needed MAX A to sit upright and then to maintain position due to fear, pain and lethargy.  Ambulation/Gait                 Stairs            Wheelchair Mobility    Modified Rankin (Stroke Patients Only)       Balance                                    Cognition Arousal/Alertness: Lethargic Behavior During Therapy: Flat affect Overall Cognitive Status: Impaired/Different from baseline Area of Impairment: Safety/judgement;Following commands;Orientation     Memory:  Decreased short-term memory Following Commands: Follows one step commands inconsistently Safety/Judgement: Decreased awareness of safety Awareness: Intellectual Problem Solving: Slow processing;Decreased initiation;Requires tactile cues General Comments: baseline dementia.  Pt lethargic today and opened eyes during session.    Exercises General Exercises - Lower Extremity Ankle Circles/Pumps: PROM;Both Long Arc Quad: PROM;AAROM;Both;Seated;10 reps Hip Flexion/Marching: PROM;Both;10 reps;Seated    General Comments        Pertinent Vitals/Pain Pain Assessment: Faces Faces Pain Scale: Hurts little more Pain Location: abdomen Pain Descriptors / Indicators: Grimacing;Guarding;Moaning Pain Intervention(s): Limited activity within patient's tolerance;Repositioned    Home Living                      Prior Function            PT Goals (current goals can now be found in the care plan section) Acute Rehab PT Goals Patient Stated Goal: none expressed PT Goal Formulation: Patient unable to participate in goal setting Time For Goal Achievement: 01/12/17 Potential to Achieve Goals: Fair Progress towards PT goals: Not progressing toward goals - comment (pt lethargic today)    Frequency    Min 2X/week      PT Plan Current plan remains appropriate    Co-evaluation  End of Session   Activity Tolerance: Patient limited by lethargy;Patient limited by pain Patient left: in chair;with nursing/sitter in room     Time: 0831-0849 PT Time Calculation (min) (ACUTE ONLY): 18 min  Charges:  $Therapeutic Exercise: 8-22 mins                    G Codes:      Adrienne [name] 12/29/2016, 10:11 AM

## 2016-12-29 NOTE — Progress Notes (Signed)
PHARMACY - ADULT TOTAL PARENTERAL NUTRITION CONSULT NOTE   Pharmacy Consult for TPN Indication: prolonged ileus  Patient Measurements: Height: 5\' 4"  (162.6 cm) Weight: 219 lb 5.7 oz (99.5 kg) IBW/kg (Calculated) : 54.7 TPN AdjBW (KG): 66 Body mass index is 37.65 kg/m.   Assessment: [name] admitted on 12/08/16.  Pharmacy consulted to dose TPN for prolonged ileus, started on 2/9.  Pt is post-op ex lap with repair of ventral hernia.  GI: Incarcerated possible strangulated ventral hernia with small bowel obstruction. S/p exploratory laparotomy with primary ventral hernia repair, 12/22/16. Post op ileus; NG output 350/24 hrs; last BM 2/6 Abdominal pain 2/11, per tube meds held   Pepcid per tube > change to TPN Endo: DM PTA (on metformin PTA), on SSI, CBGs mostly < 160 Insulin requirements in the past 24 hours: 4 Lytes: K 4, Mg 2.2, CoCa 9.9; BMET 2/13 @ 0430 not drawn correctly Renal: SCr 0.6 Pulm:OSA, VDRF 1/24- 12/22/16, now 2L [place]son Cards: HTN, on metop 12.5 per tube bid Hepatobil:alb 1.9, LFTs WNL, Trig 145 on 2/10 - on fenofibrate PTA Neuro:dementia ZO:XWRUEAVW:possible asp PNA, WBC WNL, Zosyn to end today Zosyn 2/7 >> 2/14 2/7  BC x2 >>ngtd 2/8 Ucx NGF 1/28 C diff neg HIV neg, legionella urine neg, strep pneumo urine neg  Best Practices: LMWH, pepcid per tube TPN Access: PICC placed for TPN 2/9 TPN start date: 2/9  Nutritional Goals  per RD recommendation on 2/9: KCal: 1750-1950 Protein: 85 - 95 g  Current Nutrition:  TPN at 60 with lipids   Plan:  -Increase Clinimix E 5/15 to 80 ml/hr -Continue 20% lipid emulsion at 20 ml/hr -This provides 96 g of protein and 1843 kCals per day meeting 100% of protein and kCal needs -Add Pepcid in TPN -Trace elements and MVI every other day, next 2/14 -Monitor TPN labs    Agapito GamesAlison Carols Clemence, PharmD, BCPS Clinical Pharmacist 12/29/2016 9:34 AM

## 2016-12-29 NOTE — Progress Notes (Signed)
Placed in a sitting position in her recliner with sacral cushion on at this time.NGT pulled out as per ordered.

## 2016-12-29 NOTE — Progress Notes (Signed)
PROGRESS NOTE    Adrienne [name]  OZH:[id number] DOB: September 04, 1936 DOA: 12/08/2016 PCP: [name], MD   Brief Narrative: [name] with pmhx significant for DM, HTN, OSA, and dementia who presented from her nursing home with abdominal distention and respiratory distress.Patient was found to have ileus vs SBO, high lactic acid. She was intubated on 1/24 to 1/27. Transferred to [facility] from [facility] on 12/13/2016. On 12/21/2016 however developed abdominal pain with significant emesis. KUB done was concerning for gastric outlet obstruction. CT abdomen and pelvis done 12/22/2016 was concerning for bowel strangulation and possible atelectasis and pneumonia. Patient kept nothing by mouth NG tube in place. General surgery consulted for further evaluation. Patient placed empirically on IV Zosyn.  Assessment & Plan:  #  Incarcerated possibly strangulated ventral hernia with small bowel obstruction: Status post exploratory laparotomy with primary ventral hernia repair 12/22/16.  -Gen. surgery consult appreciated. Patient developed postoperative ileus requiring NG tube placement. Repeat x-ray today looks improving. -Planned to discontinue NG tube by general surgery. -Clear liquid diet was started -Patient is now on TPN via PICC line placement. -Continue to provide supportive care.  #Possible aspiration pneumonia: Currently on IV Zosyn. Seventh day course today. Discontinue antibiotics after today's dose.  #Hypertension: Monitor blood pressure.  #Acute kidney injury on admission: Serum creatinine level improved.  #Acute blood loss anemia: Hemoglobin level stable. Continue to monitor.  #Dementia without behavioral disturbance: Continue supportive care. PT OT evaluation and treatment. Plan to discharge to SNF at the end of this hospitalization.  Principal Problem:   Strangulation of intestine (HCC) Active Problems:   SBO (small bowel obstruction)   Acute on chronic respiratory failure with  hypoxemia (HCC)   Acute renal failure (HCC)   Elevated lactic acid level   Metabolic acidosis   Ileus (HCC)   Ventilator dependent (HCC)   Pressure injury of skin   Oral phase dysphagia   DNR (do not resuscitate) discussion   Palliative care by specialist   Abdominal distention   Leukocytosis   Aspiration pneumonia of left lung (HCC)   Postoperative ileus (HCC)   Goals of care, counseling/discussion   Palliative care encounter  DVT prophylaxis: Lovenox subcutaneous Code Status: Full code Family Communication: No family present at bedside Disposition Plan: Likely discharge to SNF. To 3 days    Consultants:   General surgery: Dr.Tsuei 12/08/2016  Wound care  Palliative care Lorinda Creed, NP 12/19/2016  Procedures:  CT abdomen and pelvis 12/08/2006, 12/22/2016  Exploratory laparotomy with repair of ventral hernia. Antimicrobials:  IV Zosyn 12/22/2016>>>>>12/29/2016 Subjective: Patient was seen and examined at bedside. Patient was lying on bed. No chest pain or shortness of breath. No bowel movement.  Objective: Vitals:   12/28/16 1712 12/28/16 2022 12/29/16 0412 12/29/16 1048  BP: (!) 131/49 114/79 (!) 133/47 (!) 134/55  Pulse: 74 79 80 75  Resp:  19 20 18   Temp: 98.3 F (36.8 C) 98.4 F (36.9 C) 97.9 F (36.6 C) 98.1 F (36.7 C)  TempSrc: Oral Oral Oral Oral  SpO2: 100% 99% 99% 100%  Weight:  99.5 kg (219 lb 5.7 oz)    Height:        Intake/Output Summary (Last 24 hours) at 12/29/16 1359 Last data filed at 12/29/16 1056  Gross per 24 hour  Intake                0 ml  Output              350 ml  Net             -350 ml   Filed Weights   12/27/16 0448 12/27/16 2030 12/28/16 2022  Weight: 102.4 kg (225 lb 12 oz) 98 kg (216 lb) 99.5 kg (219 lb 5.7 oz)    Examination:  General exam: NG tube in place, Appears calm and comfortable  Respiratory system: Clear to auscultation. Respiratory effort normal. No wheezing or crackle Cardiovascular system: S1 &  S2 heard, RRR.  No pedal edema. Gastrointestinal system: Abdominal binder and drain in place. Bowel sounds very sluggish. Central nervous system: Alert awake and following commands.  Extremities: Symmetric 5 x 5 power. Skin: No rashes, lesions or ulcers Psychiatry: Judgement and insight appear normal. Mood & affect appropriate.     Data Reviewed: I have personally reviewed following labs and imaging studies  CBC:  Recent Labs Lab 12/23/16 0613 12/24/16 0518 12/25/16 0414 12/27/16 0451 12/28/16 0432 12/29/16 0459  WBC 7.4 8.2 6.6 8.5 9.4 8.7  NEUTROABS 4.6  --  3.9 4.9  --   --   HGB 9.4* 9.3* 8.5* 9.1* 9.0* 9.0*  HCT 30.2* 30.5* 28.2* 28.9* 29.2* 28.3*  MCV 101.0* 102.3* 102.5* 99.7 103.2* 100.7*  PLT 262 245 219 194 188 165   Basic Metabolic Panel:  Recent Labs Lab 12/24/16 0518 12/25/16 0414 12/26/16 0546 12/27/16 0451 12/28/16 0432 12/28/16 1020 12/29/16 0459  NA 145 145 141 138 130* 136 136  K 3.7 3.7 3.6 3.8 5.2* 4.5 4.0  CL 114* 113* 109 109 102 104 102  CO2 17* 23 24 23 22 25 25   GLUCOSE 124* 148* 165* 147* 560* 142* 165*  BUN 22* 13 10 8 10 13 13   CREATININE 0.94 0.70 0.61 0.58 0.64 0.68 0.65  CALCIUM 8.3* 8.4* 8.4* 8.2* 8.1* 8.4* 8.3*  MG 2.0 1.6* 1.5* 1.7 2.2  --   --   PHOS  --  2.2* 3.5 3.5  --   --   --    GFR: Estimated Creatinine Clearance: 66.4 mL/min (by C-G formula based on SCr of 0.65 mg/dL). Liver Function Tests:  Recent Labs Lab 12/23/16 0751 12/25/16 0414 12/27/16 0451  AST 22 22 23   ALT 17 14 13*  ALKPHOS 41 43 43  BILITOT 1.1 0.7 0.3  PROT 5.9* 5.7* 5.8*  ALBUMIN 2.3* 1.9* 1.9*   No results for input(s): LIPASE, AMYLASE in the last 168 hours. No results for input(s): AMMONIA in the last 168 hours. Coagulation Profile: No results for input(s): INR, PROTIME in the last 168 hours. Cardiac Enzymes: No results for input(s): CKTOTAL, CKMB, CKMBINDEX, TROPONINI in the last 168 hours. BNP (last 3 results) No results for  input(s): PROBNP in the last 8760 hours. HbA1C: No results for input(s): HGBA1C in the last 72 hours. CBG:  Recent Labs Lab 12/28/16 2019 12/29/16 0009 12/29/16 0409 12/29/16 0856 12/29/16 1142  GLUCAP 124* 116* 134* 151* 121*   Lipid Profile:  Recent Labs  12/27/16 0451  TRIG 153*   Thyroid Function Tests: No results for input(s): TSH, T4TOTAL, FREET4, T3FREE, THYROIDAB in the last 72 hours. Anemia Panel: No results for input(s): VITAMINB12, FOLATE, FERRITIN, TIBC, IRON, RETICCTPCT in the last 72 hours. Sepsis Labs:  Recent Labs Lab 12/23/16 0920  LATICACIDVEN 1.8    Recent Results (from the past 240 hour(s))  Culture, blood (routine x 2) Call MD if unable to obtain prior to antibiotics being given     Status: None   Collection Time: 12/22/16 11:13 AM  Result Value Ref Range Status   Specimen Description BLOOD LEFT HAND  Final   Special Requests IN PEDIATRIC BOTTLE 1CC  Final   Culture NO GROWTH 5 DAYS  Final   Report Status 12/27/2016 FINAL  Final  Culture, blood (routine x 2) Call MD if unable to obtain prior to antibiotics being given     Status: None   Collection Time: 12/22/16 11:24 AM  Result Value Ref Range Status   Specimen Description BLOOD LEFT HAND  Final   Special Requests IN PEDIATRIC BOTTLE 3CC  Final   Culture NO GROWTH 5 DAYS  Final   Report Status 12/27/2016 FINAL  Final  Urine culture     Status: None   Collection Time: 12/23/16 11:41 PM  Result Value Ref Range Status   Specimen Description URINE, RANDOM  Final   Special Requests NONE  Final   Culture NO GROWTH  Final   Report Status 12/25/2016 FINAL  Final         Radiology Studies: Dg Abd Portable 1v  Result Date: 12/29/2016 CLINICAL DATA:  Abdominal pain and distention today. EXAM: PORTABLE ABDOMEN - 1 VIEW COMPARISON:  CT scan of the abdomen and pelvis of December 22, 2016 FINDINGS: The patient has undergone interval right ventral hernia repair. Surgical clips are present to the  right of midline over the pelvis. There is a moderate amount of gas within the ascending and transverse portions of the colon. Small amounts of small bowel gas are noted overlying the left aspect of the abdomen and in the midline. There is gas in the rectosigmoid. There are degenerative changes of the lumbar spine. There are numerous pelvic phleboliths. IMPRESSION: The bowel gas pattern may reflect a mild ileus. No obstructive pattern is observed. Electronically Signed   By: David  SwazilandJordan M.D.   On: 12/29/2016 09:10        Scheduled Meds: . enoxaparin (LOVENOX) injection  40 mg Subcutaneous Q24H  . insulin aspart  0-9 Units Subcutaneous Q4H  . mouth rinse  15 mL Mouth Rinse BID  . piperacillin-tazobactam (ZOSYN)  IV  3.375 g Intravenous Q8H   Continuous Infusions: . Marland Kitchen.TPN (CLINIMIX-E) Adult     And  . fat emulsion    . Marland Kitchen.TPN (CLINIMIX-E) Adult 60 mL/hr at 12/28/16 1848     LOS: 21 days    Dron Jaynie CollinsPrasad Bhandari, MD [facility] Pager [phone]  If 7PM-7AM, please contact night-coverage [email or web address] Password [email or web address] 12/29/2016, 1:59 PM

## 2016-12-29 NOTE — Progress Notes (Signed)
Placed in a recliner sitting position,with sacral cushion.

## 2016-12-29 NOTE — Progress Notes (Signed)
Placed back in recliner in sitting position with sacral foam,after four hours of sleep in bed this afternoon.

## 2016-12-29 NOTE — Progress Notes (Signed)
Patient placed back in bed ,yelling for pain from her buttocks even after pain meds given.

## 2016-12-29 NOTE — Progress Notes (Signed)
Patient ate one cup of Gelatin (14g) and 4 oz of Cranberry juice.No complained except her butts was hurting.

## 2016-12-29 NOTE — Progress Notes (Signed)
Placed back in bed for abdominal x-ray,would let stay in bed for two hours since patient has been in her recliner since 0630.

## 2016-12-29 NOTE — Progress Notes (Signed)
Patient ID: Adrienne [name], female   DOB: 1935/12/07, 81 y.o.   MRN: [id number]  [facility] Surgery Progress Note  7 Days Post-Op  Subjective: Up in chair this morning. Abdomen feels the same. States that she is not passing flatus today, and she has not had a bowel movement. Complaining of buttock pain.  Objective: Vital signs in last 24 hours: Temp:  [97.9 F (36.6 C)-98.4 F (36.9 C)] 97.9 F (36.6 C) (02/14 0412) Pulse Rate:  [72-80] 80 (02/14 0412) Resp:  [19-20] 20 (02/14 0412) BP: (114-136)/(47-79) 133/47 (02/14 0412) SpO2:  [99 %-100 %] 99 % (02/14 0412) Weight:  [219 lb 5.7 oz (99.5 kg)] 219 lb 5.7 oz (99.5 kg) (02/13 2022) Last BM Date: 12/22/16  Intake/Output from previous day: 02/13 0701 - 02/14 0700 In: 0  Out: 352 [Urine:2; Emesis/NG output:350] Intake/Output this shift: No intake/output data recorded.  PE: Gen: Alert, NAD Pulm: CTAB, effort normal Abd: Soft, mild distension, TTP right sided abdomen, +BS, incision C/D/I with staples intact, drain with minimal sanguinous drainage  Lab Results:   Recent Labs  12/28/16 0432 12/29/16 0459  WBC 9.4 8.7  HGB 9.0* 9.0*  HCT 29.2* 28.3*  PLT 188 165   BMET  Recent Labs  12/28/16 1020 12/29/16 0459  NA 136 136  K 4.5 4.0  CL 104 102  CO2 25 25  GLUCOSE 142* 165*  BUN 13 13  CREATININE 0.68 0.65  CALCIUM 8.4* 8.3*   PT/INR No results for input(s): LABPROT, INR in the last 72 hours. CMP     Component Value Date/Time   NA 136 12/29/2016 0459   K 4.0 12/29/2016 0459   CL 102 12/29/2016 0459   CO2 25 12/29/2016 0459   GLUCOSE 165 (H) 12/29/2016 0459   BUN 13 12/29/2016 0459   CREATININE 0.65 12/29/2016 0459   CALCIUM 8.3 (L) 12/29/2016 0459   PROT 5.8 (L) 12/27/2016 0451   ALBUMIN 1.9 (L) 12/27/2016 0451   AST 23 12/27/2016 0451   ALT 13 (L) 12/27/2016 0451   ALKPHOS 43 12/27/2016 0451   BILITOT 0.3 12/27/2016 0451   GFRNONAA >60 12/29/2016 0459   GFRAA >60 12/29/2016 0459    Lipase     Component Value Date/Time   LIPASE 61 (H) 12/08/2016 7425       Studies/Results: No results found.  Anti-infectives: Anti-infectives    Start     Dose/Rate Route Frequency Ordered Stop   12/29/16 0000  piperacillin-tazobactam (ZOSYN) IVPB 3.375 g     3.375 g 12.5 mL/hr over 240 Minutes Intravenous Every 8 hours 12/28/16 1931 12/29/16 2359   12/22/16 1100  piperacillin-tazobactam (ZOSYN) IVPB 3.375 g  Status:  Discontinued     3.375 g 12.5 mL/hr over 240 Minutes Intravenous Every 8 hours 12/22/16 1026 12/28/16 1931   12/08/16 1200  piperacillin-tazobactam (ZOSYN) IVPB 3.375 g  Status:  Discontinued     3.375 g 12.5 mL/hr over 240 Minutes Intravenous Every 8 hours 12/08/16 0717 12/11/16 0846   12/08/16 0345  vancomycin (VANCOCIN) IVPB 1000 mg/200 mL premix     1,000 mg 200 mL/hr over 60 Minutes Intravenous  Once 12/08/16 0333 12/08/16 0740   12/08/16 0345  piperacillin-tazobactam (ZOSYN) IVPB 3.375 g     3.375 g 100 mL/hr over 30 Minutes Intravenous  Once 12/08/16 9563 12/08/16 0741       Assessment/Plan Incarcerated possible strangulated ventral hernia with small bowel obstruction S/p exploratory laparotomy with primary ventral hernia repair, 12/22/16, DR. Cornett - POD  7 - Post op ileus - NG 350cc/24hr - WBC 8.7  Probable aspiration pneumonia - on zosyn HTN VDRF 1/24-27/2018 Acute kidney injury Anemia Dementia Malnutrition - now on TPN (prealbumin 8.0 on 2/12)  FEN: NPO/NGT, TPN ID: Zosyn 12/22/16 >> (day #7/7) DVT: SCD/Lovenox  Plan:No flatus today, few BS. Continue NPO/NGT to LIWS until bowel function returns. Check abdominal xray. Will discuss adding reglan with MD. Continue Pt/mobilization.  Addendum: reviewed XR with Dr. Janee Mornhompson. D/c NG tube and advance to clear liquids.    LOS: 21 days    Edson SnowballBROOKE A MILLER , [facility] 12/29/2016, 7:35 AM Pager: [phone] Consults: [phone] Mon-Fri 7:00 am-4:30  pm Sat-Sun 7:00 am-11:30 am

## 2016-12-29 NOTE — Progress Notes (Signed)
Patient ate one cup of Gell-o ,120 ml of Lemonade of icing and one cup of soup while sitting up.Placed back in bed,unable to tolerate sitting up even after pain medicine given.

## 2016-12-30 LAB — COMPREHENSIVE METABOLIC PANEL
ALBUMIN: 2.1 g/dL — AB (ref 3.5–5.0)
ALK PHOS: 46 U/L (ref 38–126)
ALT: 16 U/L (ref 14–54)
ANION GAP: 6 (ref 5–15)
AST: 28 U/L (ref 15–41)
BILIRUBIN TOTAL: 0.7 mg/dL (ref 0.3–1.2)
BUN: 15 mg/dL (ref 6–20)
CALCIUM: 8.4 mg/dL — AB (ref 8.9–10.3)
CO2: 27 mmol/L (ref 22–32)
CREATININE: 0.54 mg/dL (ref 0.44–1.00)
Chloride: 104 mmol/L (ref 101–111)
GFR calc Af Amer: >60 mL/min (ref >60)
GFR calc non Af Amer: >60 mL/min (ref >60)
Glucose, Bld: 162 mg/dL — ABNORMAL HIGH (ref 65–99)
Potassium: 4.1 mmol/L (ref 3.5–5.1)
Sodium: 137 mmol/L (ref 135–145)
TOTAL PROTEIN: 6 g/dL — AB (ref 6.5–8.1)

## 2016-12-30 LAB — GLUCOSE, CAPILLARY
GLUCOSE-CAPILLARY: 154 mg/dL — AB (ref 65–99)
Glucose-Capillary: 127 mg/dL — ABNORMAL HIGH (ref 65–99)
Glucose-Capillary: 138 mg/dL — ABNORMAL HIGH (ref 65–99)
Glucose-Capillary: 141 mg/dL — ABNORMAL HIGH (ref 65–99)
Glucose-Capillary: 149 mg/dL — ABNORMAL HIGH (ref 65–99)
Glucose-Capillary: 149 mg/dL — ABNORMAL HIGH (ref 65–99)
Glucose-Capillary: 157 mg/dL — ABNORMAL HIGH (ref 65–99)

## 2016-12-30 LAB — PHOSPHORUS: Phosphorus: 3.7 mg/dL (ref 2.5–4.6)

## 2016-12-30 LAB — MAGNESIUM: Magnesium: 1.4 mg/dL — ABNORMAL LOW (ref 1.7–2.4)

## 2016-12-30 MED ORDER — FAMOTIDINE 200 MG/20ML IV SOLN
80.0000 mL/h | INTRAVENOUS | Status: AC
  Administered 2016-12-30: 18:00:00 via INTRAVENOUS
  Filled 2016-12-30: qty 1920

## 2016-12-30 MED ORDER — FAT EMULSION 20 % IV EMUL
240.0000 mL | INTRAVENOUS | Status: AC
  Administered 2016-12-30: 240 mL via INTRAVENOUS
  Filled 2016-12-30: qty 250

## 2016-12-30 MED ORDER — ENSURE ENLIVE PO LIQD
237.0000 mL | Freq: Two times a day (BID) | ORAL | Status: DC
  Administered 2016-12-31 – 2017-01-02 (×5): 237 mL via ORAL

## 2016-12-30 MED ORDER — POLYETHYLENE GLYCOL 3350 17 G PO PACK
17.0000 g | PACK | Freq: Every day | ORAL | Status: DC
  Administered 2016-12-30 – 2017-01-02 (×4): 17 g via ORAL
  Filled 2016-12-30 (×4): qty 1

## 2016-12-30 NOTE — Progress Notes (Signed)
Nutrition Follow-up  DOCUMENTATION CODES:   Obesity unspecified  INTERVENTION:  TPN per Pharmacy.  Provide Ensure Enlive po BID, each supplement provides 350 kcal and 20 grams of protein.  RD to continue to monitor.   NUTRITION DIAGNOSIS:   Inadequate oral intake related to altered GI function as evidenced by NPO status; diet advanced; improving  GOAL:   Patient will meet greater than or equal to 90% of their needs; met  MONITOR:   PO intake, Supplement acceptance, Labs, Weight trends, Skin, I & O's, Diet advancement, Other (Comment) (TPN )  REASON FOR ASSESSMENT:   Consult New TPN/TNA  ASSESSMENT:   Pt with pmhx significant for DM, HTN, OSA, and dementia who presents from her nursing home with abdominal distention and respiratory distress.  2/7 S/p exploratory laparotomy with primary ventral hernia repair  Diet has been advanced to a full liquid diet. Meal completion 100% this AM. Pt was yelling out during time of visit for pain medicine, however pt has already received her meds. RD to order Ensure to aid in po intake as per Pharmacy note, plans to possibly wean or discontinue TPN tomorrow. Currently TPN Clinimix E 5/15 running at 80 ml/hr with 20% lipid emulsion at 20 ml/hr which provides 1843 kcal (100% of needs) and 86 grams of protein. RD to continue to monitor.   Labs and medications reviewed.   Diet Order:  Diet - low sodium heart healthy .TPN (CLINIMIX-E) Adult Diet full liquid Room service appropriate? Yes; Fluid consistency: Thin .TPN (CLINIMIX-E) Adult  Skin:  Wound (see comment) (Stage II to buttocks, incision on abdomen)  Last BM:  2/7  Height:   Ht Readings from Last 1 Encounters:  12/22/16 5' 4"  (1.626 m)    Weight:   Wt Readings from Last 1 Encounters:  12/29/16 218 lb 11.1 oz (99.2 kg)    Ideal Body Weight:  54.5 kg  BMI:  Body mass index is 37.54 kg/m.  Estimated Nutritional Needs:   Kcal:  0388-8280  Protein:  85-95  grams  Fluid:  1.7 - 1.9 L/day  EDUCATION NEEDS:   No education needs identified at this time  Corrin Parker, MS, RD, LDN Pager # [phone] After hours/ weekend pager # [phone]

## 2016-12-30 NOTE — Progress Notes (Signed)
Occupational Therapy Treatment Patient Details Name: Adrienne [name] MRN: [id number] DOB: [birth year] Today's Date: 12/30/2016    History of present illness 81 yo female admitted with distended abdomen/nausea, ileus vs SBO, vent d/c 12/11/16. Acute respiratory failure with hypoxia: Required mechanical ventilator  Exploratory laparotomy with repair primary a ventral hernia 2/7.   OT comments  Pt performs grooming tasks at bed level with Min A and min verbal cues for initiation. Demonstrates persevation of topics and difficulty problem solving through ADLs. Continue to follow acutely to increase independence in ADLs and mobility. D/c recommendation still appropriate to maintain safety and occupational performance.    Follow Up Recommendations  SNF;Supervision/Assistance - 24 hour    Equipment Recommendations  3 in 1 bedside commode;Hospital bed;Wheelchair cushion (measurements OT);Wheelchair (measurements OT);Other (comment)    Recommendations for Other Services      Precautions / Restrictions Precautions Precautions: Fall Precaution Comments: Abdominal binder       Mobility Bed Mobility Overal bed mobility: Needs Assistance Bed Mobility: Rolling Rolling: Total assist;+2 for physical assistance         General bed mobility comments: Pt requires A to adjust in bed due to decreased activity tolerance, cogntive deficits, and pain  Transfers                      Balance                                   ADL Overall ADL's : Needs assistance/impaired     Grooming: Wash/dry hands;Wash/dry face;Oral care;Brushing hair;Minimal assistance;Cueing for sequencing;Bed level Grooming Details (indicate cue type and reason): Pt requires Min A to perform grooming tasks and problem solve sequencing                                General ADL Comments: Limited participation in ADLs due to congitive deficits      Vision                      Perception     Praxis      Cognition   Behavior During Therapy: [facility] HospWFL for tasks assessed/performed Overall Cognitive Status: Impaired/Different from baseline Area of Impairment: Memory;Awareness;Attention;Following commands;Orientation Orientation Level: Disoriented to;Place;Time;Situation   Memory: Decreased short-term memory  Following Commands: Follows one step commands with increased time;Follows one step commands inconsistently Safety/Judgement: Decreased awareness of safety Awareness: Intellectual Problem Solving: Slow processing;Difficulty sequencing;Requires verbal cues;Requires tactile cues General Comments: Pt's cognition impacts functional performance; baseline dementia    Extremity/Trunk Assessment               Exercises     Shoulder Instructions       General Comments      Pertinent Vitals/ Pain       Pain Assessment: Faces Faces Pain Scale: Hurts a little bit Pain Descriptors / Indicators: Grimacing Pain Intervention(s): Monitored during session  Home Living                                          Prior Functioning/Environment              Frequency  Min 2X/week        Progress Toward Goals  OT Goals(current goals  can now be found in the care plan section)  Progress towards OT goals: Progressing toward goals  Acute Rehab OT Goals Patient Stated Goal: none expressed OT Goal Formulation: With patient Time For Goal Achievement: 01/06/17 Potential to Achieve Goals: Good ADL Goals Pt Will Perform Grooming: sitting;with supervision Pt Will Perform Upper Body Bathing: with supervision;sitting Pt Will Transfer to Toilet: with +2 assist;with min assist;bedside commode;stand pivot transfer Additional ADL Goal #1: Pt will complete bed mobility total +2 min (A) as precursor to adls.   Plan Discharge plan remains appropriate    Co-evaluation                 End of Session Equipment Utilized During Treatment:  Oxygen   Activity Tolerance Patient tolerated treatment well   Patient Left in bed;with call bell/phone within reach;with bed alarm set   Nurse Communication          Time: 1500-1520 OT Time Calculation (min): 20 min  Charges: OT General Charges $OT Visit: 1 Procedure OT Treatments $Self Care/Home Management : 8-22 mins  Talvin Christianson M Vishal Sandlin 12/30/2016, 4:22 PM  [employer], OTR/L [phone]

## 2016-12-30 NOTE — Progress Notes (Signed)
Physical Therapy Treatment Patient Details Name: Adrienne [name] MRN: [id number] DOB: 05-10-1936 Today's Date: 12/30/2016    History of Present Illness 81 yo female admitted with distended abdomen/nausea, ileus vs SBO, vent d/c 12/11/16. Acute respiratory failure with hypoxia: Required mechanical ventilator  Exploratory laparotomy with repair primary a ventral hernia 2/7.    PT Comments    Patient calling out for help.  Reports she wants pain medicine.  RN reports she received pain meds 45 minutes prior.  Patient tolerated ther ex poorly due to pain.  Agree with need to return to SNF at d/c.  Follow Up Recommendations  SNF     Equipment Recommendations  None recommended by PT    Recommendations for Other Services       Precautions / Restrictions Precautions Precautions: Fall Precaution Comments: Abdominal binder Restrictions Weight Bearing Restrictions: No    Mobility  Bed Mobility               General bed mobility comments: Patient in recliner via maximove lift equipment.  Transfers                    Ambulation/Gait                 Stairs            Wheelchair Mobility    Modified Rankin (Stroke Patients Only)       Balance                                    Cognition Arousal/Alertness: Awake/alert Behavior During Therapy: Flat affect Overall Cognitive Status: Impaired/Different from baseline Area of Impairment: Orientation;Memory;Following commands;Safety/judgement;Awareness;Problem solving Orientation Level: Disoriented to;Place;Time;Situation   Memory: Decreased short-term memory Following Commands: Follows one step commands with increased time Safety/Judgement: Decreased awareness of safety   Problem Solving: Slow processing;Decreased initiation;Requires tactile cues General Comments: Baseline dementia    Exercises General Exercises - Upper Extremity Shoulder Flexion: AROM;Both;5 reps;Seated Elbow  Extension: AROM;Both;5 reps;Seated Digit Composite Flexion: AROM;Both;5 reps;Seated General Exercises - Lower Extremity Ankle Circles/Pumps: PROM;Both;5 reps Quad Sets: AAROM;Both (Attempted with tactile facilitation) Heel Slides: PROM;Both;5 reps;Seated    General Comments        Pertinent Vitals/Pain Pain Assessment: Faces Faces Pain Scale: Hurts even more Pain Location: "bottom" Pain Descriptors / Indicators: Crying;Grimacing;Moaning Pain Intervention(s): Monitored during session;Premedicated before session;Repositioned    Home Living                      Prior Function            PT Goals (current goals can now be found in the care plan section) Acute Rehab PT Goals Patient Stated Goal: none expressed Progress towards PT goals: Not progressing toward goals - comment (Limited progress due to pain)    Frequency    Min 2X/week      PT Plan Current plan remains appropriate    Co-evaluation             End of Session   Activity Tolerance: Patient limited by pain Patient left: in chair;with call bell/phone within reach     Time: 1200-1209 PT Time Calculation (min) (ACUTE ONLY): 9 min  Charges:  $Therapeutic Exercise: 8-22 mins                    G Codes:      Vena Austria  12/30/2016, 12:23 PM Durenda HurtSusan H. Renaldo Fiddleravis, PT, [facility] Acute Rehab Services Pager [phone]

## 2016-12-30 NOTE — Progress Notes (Signed)
Patient ID: Adrienne [name], female   DOB: [birth year], 81 y.o.   MRN: [id number]  [facility]  8 Days Post-Op  Subjective: No complaints this morning. Tired, but answers questions. Denies any current abdominal pain. Tolerated clear liquids yesterday. Per report no n/v with clears. Patient passing flatus, no BM.  Objective: Vital signs in last 24 hours: Temp:  [98 F (36.7 C)-98.6 F (37 C)] 98.6 F (37 C) (02/15 0420) Pulse Rate:  [75-78] 78 (02/15 0420) Resp:  [17-19] 19 (02/15 0420) BP: (122-134)/(43-63) 128/63 (02/15 0420) SpO2:  [100 %] 100 % (02/15 0420) Weight:  [218 lb 11.1 oz (99.2 kg)] 218 lb 11.1 oz (99.2 kg) (02/14 2032) Last BM Date: 12/22/16  Intake/Output from previous day: 02/14 0701 - 02/15 0700 In: 2033.3 [P.O.:1200; I.V.:833.3] Out: 15 [Drains:15] Intake/Output this shift: No intake/output data recorded.  PE: Gen: Alert, NAD, drowsy Pulm: CTAB, effort normal Abd: Soft, mild distension, TTP right sided abdomen, +BS, incision C/D/I with staples intact, drain with minimal sanguinous drainage; not currently wearing abdominal binder  Lab Results:   Recent Labs  12/28/16 0432 12/29/16 0459  WBC 9.4 8.7  HGB 9.0* 9.0*  HCT 29.2* 28.3*  PLT 188 165   BMET  Recent Labs  12/28/16 1020 12/29/16 0459  NA 136 136  K 4.5 4.0  CL 104 102  CO2 25 25  GLUCOSE 142* 165*  BUN 13 13  CREATININE 0.68 0.65  CALCIUM 8.4* 8.3*   PT/INR No results for input(s): LABPROT, INR in the last 72 hours. CMP     Component Value Date/Time   NA 136 12/29/2016 0459   K 4.0 12/29/2016 0459   CL 102 12/29/2016 0459   CO2 25 12/29/2016 0459   GLUCOSE 165 (H) 12/29/2016 0459   BUN 13 12/29/2016 0459   CREATININE 0.65 12/29/2016 0459   CALCIUM 8.3 (L) 12/29/2016 0459   PROT 5.8 (L) 12/27/2016 0451   ALBUMIN 1.9 (L) 12/27/2016 0451   AST 23 12/27/2016 0451   ALT 13 (L) 12/27/2016 0451   ALKPHOS 43 12/27/2016 0451   BILITOT 0.3  12/27/2016 0451   GFRNONAA >60 12/29/2016 0459   GFRAA >60 12/29/2016 0459   Lipase     Component Value Date/Time   LIPASE 61 (H) 12/08/2016 0922       Studies/Results: Dg Abd Portable 1v  Result Date: 12/29/2016 CLINICAL DATA:  Abdominal pain and distention today. EXAM: PORTABLE ABDOMEN - 1 VIEW COMPARISON:  CT scan of the abdomen and pelvis of December 22, 2016 FINDINGS: The patient has undergone interval right ventral hernia repair. Surgical clips are present to the right of midline over the pelvis. There is a moderate amount of gas within the ascending and transverse portions of the colon. Small amounts of small bowel gas are noted overlying the left aspect of the abdomen and in the midline. There is gas in the rectosigmoid. There are degenerative changes of the lumbar spine. There are numerous pelvic phleboliths. IMPRESSION: The bowel gas pattern may reflect a mild ileus. No obstructive pattern is observed. Electronically Signed   By: David  SwazilandJordan M.D.   On: 12/29/2016 09:10    Anti-infectives: Anti-infectives    Start     Dose/Rate Route Frequency Ordered Stop   12/29/16 0000  piperacillin-tazobactam (ZOSYN) IVPB 3.375 g     3.375 g 12.5 mL/hr over 240 Minutes Intravenous Every 8 hours 12/28/16 1931 12/29/16 2009   12/22/16 1100  piperacillin-tazobactam (ZOSYN) IVPB 3.375 g  Status:  Discontinued     3.375 g 12.5 mL/hr over 240 Minutes Intravenous Every 8 hours 12/22/16 1026 12/28/16 1931   12/08/16 1200  piperacillin-tazobactam (ZOSYN) IVPB 3.375 g  Status:  Discontinued     3.375 g 12.5 mL/hr over 240 Minutes Intravenous Every 8 hours 12/08/16 0717 12/11/16 0846   12/08/16 0345  vancomycin (VANCOCIN) IVPB 1000 mg/200 mL premix     1,000 mg 200 mL/hr over 60 Minutes Intravenous  Once 12/08/16 0333 12/08/16 0740   12/08/16 0345  piperacillin-tazobactam (ZOSYN) IVPB 3.375 g     3.375 g 100 mL/hr over 30 Minutes Intravenous  Once 12/08/16 4098 12/08/16 0741        Assessment/Plan Incarcerated possible strangulated ventral hernia with small bowel obstruction S/p exploratory laparotomy with primary ventral hernia repair, 12/22/16, DR. Cornett - POD 8 - drain 15cc/24hr sanguinous  Probable aspiration pneumonia - completed course of zosyn HTN VDRF 1/24-27/2018 Acute kidney injury Anemia Dementia Malnutrition - now on TPN (prealbumin 8.0 on 2/12)  FEN: TPN, full liquids ID: Zosyn 12/22/16 >>2/15 DVT: SCD/Lovenox  Plan:Abdomen a little distended, but patient tolerated clear liquids and has good BS. Advance to full liquids. Continue TPN until tolerating adequate amount of diet. Add daily miralax. Continue PT/mobilization. Continue abdominal binder. Will discuss d/c-ing drain with MD.   LOS: 22 days    Edson Snowball , [facility] 12/30/2016, 7:47 AM Pager: [phone] Consults: [phone] Mon-Fri 7:00 am-4:30 pm Sat-Sun 7:00 am-11:30 am

## 2016-12-30 NOTE — Progress Notes (Signed)
PROGRESS NOTE    Adrienne [name]  JXB:[id number] DOB: Apr 27, 1936 DOA: 12/08/2016 PCP: Londell MohPHARR,WALTER DAVIDSON, MD   Brief Narrative: [name] with pmhx significant for DM, HTN, OSA, and dementia who presented from her nursing home with abdominal distention and respiratory distress.Patient was found to have ileus vs SBO, high lactic acid. She was intubated on 1/24 to 1/27. [facility]. On 12/21/2016 however developed abdominal pain with significant emesis. KUB done was concerning for gastric outlet obstruction. CT abdomen and pelvis done 12/22/2016 was concerning for bowel strangulation and possible atelectasis and pneumonia. Patient kept nothing by mouth NG tube in place. General surgery consulted for further evaluation. Patient placed empirically on IV Zosyn.  Assessment & Plan:  #  Incarcerated possibly strangulated ventral hernia with small bowel obstruction: Status post exploratory laparotomy with primary ventral hernia repair 12/22/16.  -Gen. surgery consult appreciated. Patient developed postoperative ileus requiring NG tube placement. Repeat x-ray today looks improving. The NG tube was removed and he started on clear liquid diet. Patient tolerated diet well. Advancing to a full liquid diet today by surgeon. Patient reported no bowel movement and has not passed any gas. -Patient is now on TPN via PICC line placement. Wean TPN when patient has good oral intake. -Continue to provide supportive care.  #Possible aspiration pneumonia: Completed a course of IV Zosyn on 2/14.  #Hypertension: Monitor blood pressure. Blood pressure acceptable.  #Acute kidney injury on admission: Serum creatinine level improved.  #Acute blood loss anemia: Hemoglobin level stable. Continue to monitor.  #Dementia without behavioral disturbance: Continue supportive care. PT OT evaluation and treatment. Plan to discharge to SNF at the end of this hospitalization.  Principal Problem:  Strangulation of intestine (HCC) Active Problems:   SBO (small bowel obstruction)   Acute on chronic respiratory failure with hypoxemia (HCC)   Acute renal failure (HCC)   Elevated lactic acid level   Metabolic acidosis   Ileus (HCC)   Ventilator dependent (HCC)   Pressure injury of skin   Oral phase dysphagia   DNR (do not resuscitate) discussion   Palliative care by specialist   Abdominal distention   Leukocytosis   Aspiration pneumonia of left lung (HCC)   Postoperative ileus (HCC)   Goals of care, counseling/discussion   Palliative care encounter  DVT prophylaxis: Lovenox subcutaneous Code Status: Full code Family Communication: No family present at bedside Disposition Plan: Likely discharge to SNF in 1 to 3 days    Consultants:   General surgery: Dr.Tsuei 12/08/2016  Wound care  Palliative care Lorinda CreedMary Larach, NP 12/19/2016  Procedures:  CT abdomen and pelvis 12/08/2006, 12/22/2016  Exploratory laparotomy with repair of ventral hernia. Antimicrobials:  IV Zosyn 12/22/2016>>>>>12/29/2016 Subjective: Patient was seen and examined at bedside. Patient reported weakness. Denied nausea or vomiting with clear liquid diet. No other new event. No chest pain or shortness of breath. Objective: Vitals:   12/29/16 1048 12/29/16 1647 12/29/16 2032 12/30/16 0420  BP: (!) 134/55 122/62 (!) 134/43 128/63  Pulse: 75 77 76 78  Resp: 18 17 18 19   Temp: 98.1 F (36.7 C) 98.6 F (37 C) 98 F (36.7 C) 98.6 F (37 C)  TempSrc: Oral Oral Oral Oral  SpO2: 100% 100% 100% 100%  Weight:   99.2 kg (218 lb 11.1 oz)   Height:        Intake/Output Summary (Last 24 hours) at 12/30/16 1122 Last data filed at 12/30/16 1029  Gross per 24 hour  Intake  2473.34 ml  Output               15 ml  Net          2458.34 ml   Filed Weights   12/27/16 2030 12/28/16 2022 12/29/16 2032  Weight: 98 kg (216 lb) 99.5 kg (219 lb 5.7 oz) 99.2 kg (218 lb 11.1 oz)    Examination:  General  exam: NG tube is out. Not in distress lying on bed comfortable Respiratory system: Clear to auscultation. Respiratory effort normal. No wheezing or crackle Cardiovascular system: S1 & S2 heard, RRR.  No pedal edema. Gastrointestinal system: Abdominal binder and drain in place. Positive bowel sound. Central nervous system: Alert awake and following commands.  Extremities: Symmetric 5 x 5 power. Skin: No rashes, lesions or ulcers Psychiatry: Judgement and insight appear normal. Mood & affect appropriate.     Data Reviewed: I have personally reviewed following labs and imaging studies  CBC:  Recent Labs Lab 12/24/16 0518 12/25/16 0414 12/27/16 0451 12/28/16 0432 12/29/16 0459  WBC 8.2 6.6 8.5 9.4 8.7  NEUTROABS  --  3.9 4.9  --   --   HGB 9.3* 8.5* 9.1* 9.0* 9.0*  HCT 30.5* 28.2* 28.9* 29.2* 28.3*  MCV 102.3* 102.5* 99.7 103.2* 100.7*  PLT 245 219 194 188 165   Basic Metabolic Panel:  Recent Labs Lab 12/25/16 0414 12/26/16 0546 12/27/16 0451 12/28/16 0432 12/28/16 1020 12/29/16 0459 12/30/16 0938  NA 145 141 138 130* 136 136 137  K 3.7 3.6 3.8 5.2* 4.5 4.0 4.1  CL 113* 109 109 102 104 102 104  CO2 23 24 23 22 25 25 27   GLUCOSE 148* 165* 147* 560* 142* 165* 162*  BUN 13 10 8 10 13 13 15   CREATININE 0.70 0.61 0.58 0.64 0.68 0.65 0.54  CALCIUM 8.4* 8.4* 8.2* 8.1* 8.4* 8.3* 8.4*  MG 1.6* 1.5* 1.7 2.2  --   --  1.4*  PHOS 2.2* 3.5 3.5  --   --   --  3.7   GFR: Estimated Creatinine Clearance: 66.3 mL/min (by C-G formula based on SCr of 0.54 mg/dL). Liver Function Tests:  Recent Labs Lab 12/25/16 0414 12/27/16 0451 12/30/16 0938  AST 22 23 28   ALT 14 13* 16  ALKPHOS 43 43 46  BILITOT 0.7 0.3 0.7  PROT 5.7* 5.8* 6.0*  ALBUMIN 1.9* 1.9* 2.1*   No results for input(s): LIPASE, AMYLASE in the last 168 hours. No results for input(s): AMMONIA in the last 168 hours. Coagulation Profile: No results for input(s): INR, PROTIME in the last 168 hours. Cardiac  Enzymes: No results for input(s): CKTOTAL, CKMB, CKMBINDEX, TROPONINI in the last 168 hours. BNP (last 3 results) No results for input(s): PROBNP in the last 8760 hours. HbA1C: No results for input(s): HGBA1C in the last 72 hours. CBG:  Recent Labs Lab 12/29/16 1657 12/29/16 2019 12/30/16 0034 12/30/16 0417 12/30/16 0735  GLUCAP 126* 160* 138* 141* 149*   Lipid Profile: No results for input(s): CHOL, HDL, LDLCALC, TRIG, CHOLHDL, LDLDIRECT in the last 72 hours. Thyroid Function Tests: No results for input(s): TSH, T4TOTAL, FREET4, T3FREE, THYROIDAB in the last 72 hours. Anemia Panel: No results for input(s): VITAMINB12, FOLATE, FERRITIN, TIBC, IRON, RETICCTPCT in the last 72 hours. Sepsis Labs: No results for input(s): PROCALCITON, LATICACIDVEN in the last 168 hours.  Recent Results (from the past 240 hour(s))  Culture, blood (routine x 2) Call MD if unable to obtain prior to antibiotics being given  Status: None   Collection Time: 12/22/16 11:13 AM  Result Value Ref Range Status   Specimen Description BLOOD LEFT HAND  Final   Special Requests IN PEDIATRIC BOTTLE 1CC  Final   Culture NO GROWTH 5 DAYS  Final   Report Status 12/27/2016 FINAL  Final  Culture, blood (routine x 2) Call MD if unable to obtain prior to antibiotics being given     Status: None   Collection Time: 12/22/16 11:24 AM  Result Value Ref Range Status   Specimen Description BLOOD LEFT HAND  Final   Special Requests IN PEDIATRIC BOTTLE 3CC  Final   Culture NO GROWTH 5 DAYS  Final   Report Status 12/27/2016 FINAL  Final  Urine culture     Status: None   Collection Time: 12/23/16 11:41 PM  Result Value Ref Range Status   Specimen Description URINE, RANDOM  Final   Special Requests NONE  Final   Culture NO GROWTH  Final   Report Status 12/25/2016 FINAL  Final         Radiology Studies: Dg Abd Portable 1v  Result Date: 12/29/2016 CLINICAL DATA:  Abdominal pain and distention today. EXAM:  PORTABLE ABDOMEN - 1 VIEW COMPARISON:  CT scan of the abdomen and pelvis of December 22, 2016 FINDINGS: The patient has undergone interval right ventral hernia repair. Surgical clips are present to the right of midline over the pelvis. There is a moderate amount of gas within the ascending and transverse portions of the colon. Small amounts of small bowel gas are noted overlying the left aspect of the abdomen and in the midline. There is gas in the rectosigmoid. There are degenerative changes of the lumbar spine. There are numerous pelvic phleboliths. IMPRESSION: The bowel gas pattern may reflect a mild ileus. No obstructive pattern is observed. Electronically Signed   By: David  [place] M.D.   On: 12/29/2016 09:10        Scheduled Meds: . enoxaparin (LOVENOX) injection  40 mg Subcutaneous Q24H  . insulin aspart  0-9 Units Subcutaneous Q4H  . mouth rinse  15 mL Mouth Rinse BID  . polyethylene glycol  17 g Oral Daily   Continuous Infusions: . Marland KitchenTPN (CLINIMIX-E) Adult 80 mL/hr (12/29/16 1748)  . Marland KitchenTPN (CLINIMIX-E) Adult     And  . fat emulsion       LOS: 22 days    Dron Jaynie Collins, MD [facility] Pager [phone]  If 7PM-7AM, please contact night-coverage [email or web address] Password [email or web address] 12/30/2016, 11:22 AM

## 2016-12-30 NOTE — Progress Notes (Signed)
PHARMACY - ADULT TOTAL PARENTERAL NUTRITION CONSULT NOTE   Pharmacy Consult for TPN Indication: prolonged ileus  Patient Measurements: Height: 5\' 4"  (162.6 cm) Weight: 218 lb 11.1 oz (99.2 kg) IBW/kg (Calculated) : 54.7 TPN AdjBW (KG): 66 Body mass index is 37.54 kg/m.   Assessment: [name] admitted on 12/08/16.  Pharmacy consulted to dose TPN for prolonged ileus, started on 2/9.  Pt is post-op ex lap with repair of ventral hernia.  GI: Incarcerated possible strangulated ventral hernia with small bowel obstruction. S/p exploratory laparotomy with primary ventral hernia repair 12/22/16. Albumin 2.1, pre-albumin 8 Post op ileus; NG removed; last BM 2/6, +bowel sounds  Abdominal pain 2/11, per tube meds held   Pepcid per tube > change to TPN  Diet advanced to FLD today  Endo: DM PTA (on metformin PTA), on SSI, CBGs mostly < 160 Insulin requirements in the past 24 hours: 8 Lytes: CoCa 9.9, lytes wnl Renal: SCr 0.54 Pulm:OSA, VDRF 1/24- 12/22/16, now 2L [place] Cards: HTN, on metop 12.5 per tube bid Hepatobil: LFTs WNL, Trig 153 Neuro:dementia ID: Abx completed for aspiration pneumonia Zosyn 2/7 >> 2/14 2/7  BC x2 >>ngtd 2/8 Ucx NGF 1/28 C diff neg HIV neg, legionella urine neg, strep pneumo urine neg  Best Practices: LMWH, pepcid per tube TPN Access: PICC placed for TPN 2/9 TPN start date: 2/9  Nutritional Goals  per RD recommendation on 2/9: KCal: 1750-1950 Protein: 85 - 95 g  Current Nutrition:  TPN at 60 with lipids FLD   Plan:  -Continue Clinimix E 5/15 at 80 ml/hr -Continue 20% lipid emulsion at 20 ml/hr -This provides 96 g of protein and 1843 kCals per day meeting 100% of protein and kCal needs -Add Pepcid in TPN -Trace elements and MVI every other day, next 2/16 -Monitor TPN labs -May be able to wean and dc TPN tomorrow    Agapito GamesAlison Charvis Lightner, PharmD, BCPS Clinical Pharmacist 12/30/2016 10:15 AM

## 2016-12-31 LAB — GLUCOSE, CAPILLARY
GLUCOSE-CAPILLARY: 158 mg/dL — AB (ref 65–99)
Glucose-Capillary: 152 mg/dL — ABNORMAL HIGH (ref 65–99)
Glucose-Capillary: 145 mg/dL — ABNORMAL HIGH (ref 65–99)
Glucose-Capillary: 139 mg/dL — ABNORMAL HIGH (ref 65–99)
Glucose-Capillary: 180 mg/dL — ABNORMAL HIGH (ref 65–99)

## 2016-12-31 MED ORDER — FAT EMULSION 20 % IV EMUL
240.0000 mL | INTRAVENOUS | Status: AC
  Administered 2016-12-31: 240 mL via INTRAVENOUS
  Filled 2016-12-31: qty 250

## 2016-12-31 MED ORDER — TRACE MINERALS CR-CU-MN-SE-ZN 10-1000-500-60 MCG/ML IV SOLN
INTRAVENOUS | Status: AC
  Administered 2016-12-31: 18:00:00 via INTRAVENOUS
  Filled 2016-12-31: qty 1920

## 2016-12-31 NOTE — Progress Notes (Signed)
[facility] Progress [facility]  9 Days Post-Op  Subjective: Pt complaining of bilateral leg pain. Denies abdominal pain. Pt states she is having flatus. She denies nausea or vomiting. She is requesting pain meds. I spoke to the nurse about getting the pt pain meds.   Objective: Vital signs in last 24 hours: Temp:  [98 F (36.7 C)-98.5 F (36.9 C)] 98 F (36.7 C) (02/16 0348) Pulse Rate:  [75-87] 75 (02/16 0348) Resp:  [18-20] 18 (02/16 0348) BP: (135-143)/(46-64) 143/53 (02/16 0348) SpO2:  [93 %-100 %] 93 % (02/16 0348) Weight:  [216 lb (98 kg)] 216 lb (98 kg) (02/15 1954) Last BM Date: 12/30/16  Intake/Output from previous day: 02/15 0701 - 02/16 0700 In: 1982.7 [P.O.:980; I.V.:1002.7] Out: 0  Intake/Output this shift: No intake/output data recorded.  PE: Gen:  Alert, NAD, pleasant, cooperative, well appearing Card:  RRR, normal S1 and S2 Pulm:  Rate and effort normal Abd: Soft, obese, nondistended, +BS, incisions C/D/I, mild generalized TTP, abdominal binder in place Skin: not diaphoretic, warm and dry  Lab Results:   Recent Labs  12/29/16 0459  WBC 8.7  HGB 9.0*  HCT 28.3*  PLT 165   BMET  Recent Labs  12/29/16 0459 12/30/16 0938  NA 136 137  K 4.0 4.1  CL 102 104  CO2 25 27  GLUCOSE 165* 162*  BUN 13 15  CREATININE 0.65 0.54  CALCIUM 8.3* 8.4*   PT/INR No results for input(s): LABPROT, INR in the last 72 hours. CMP     Component Value Date/Time   NA 137 12/30/2016 0938   K 4.1 12/30/2016 0938   CL 104 12/30/2016 0938   CO2 27 12/30/2016 0938   GLUCOSE 162 (H) 12/30/2016 0938   BUN 15 12/30/2016 0938   CREATININE 0.54 12/30/2016 0938   CALCIUM 8.4 (L) 12/30/2016 0938   PROT 6.0 (L) 12/30/2016 0938   ALBUMIN 2.1 (L) 12/30/2016 0938   AST 28 12/30/2016 0938   ALT 16 12/30/2016 0938   ALKPHOS 46 12/30/2016 0938   BILITOT 0.7 12/30/2016 0938   GFRNONAA >60 12/30/2016 0938   GFRAA >60 12/30/2016 0938   Lipase     Component Value  Date/Time   LIPASE 61 (H) 12/08/2016 0922       Studies/Results: Dg Abd Portable 1v  Result Date: 12/29/2016 CLINICAL DATA:  Abdominal pain and distention today. EXAM: PORTABLE ABDOMEN - 1 VIEW COMPARISON:  CT scan of the abdomen and pelvis of December 22, 2016 FINDINGS: The patient has undergone interval right ventral hernia repair. Surgical clips are present to the right of midline over the pelvis. There is a moderate amount of gas within the ascending and transverse portions of the colon. Small amounts of small bowel gas are noted overlying the left aspect of the abdomen and in the midline. There is gas in the rectosigmoid. There are degenerative changes of the lumbar spine. There are numerous pelvic phleboliths. IMPRESSION: The bowel gas pattern may reflect a mild ileus. No obstructive pattern is observed. Electronically Signed   By: David  [place]il[place] M.D.   On: 12/29/2016 09:10    Anti-infectives: Anti-infectives    Start     Dose/Rate Route Frequency Ordered Stop   12/29/16 0000  piperacillin-tazobactam (ZOSYN) IVPB 3.375 g     3.375 g 12.5 mL/hr over 240 Minutes Intravenous Every 8 hours 12/28/16 1931 12/29/16 2009   12/22/16 1100  piperacillin-tazobactam (ZOSYN) IVPB 3.375 g  Status:  Discontinued     3.375 g  12.5 mL/hr over 240 Minutes Intravenous Every 8 hours 12/22/16 1026 12/28/16 1931   12/08/16 1200  piperacillin-tazobactam (ZOSYN) IVPB 3.375 g  Status:  Discontinued     3.375 g 12.5 mL/hr over 240 Minutes Intravenous Every 8 hours 12/08/16 0717 12/11/16 0846   12/08/16 0345  vancomycin (VANCOCIN) IVPB 1000 mg/200 mL premix     1,000 mg 200 mL/hr over 60 Minutes Intravenous  Once 12/08/16 0333 12/08/16 0740   12/08/16 0345  piperacillin-tazobactam (ZOSYN) IVPB 3.375 g     3.375 g 100 mL/hr over 30 Minutes Intravenous  Once 12/08/16 1610 12/08/16 0741       Assessment/Plan  Incarcerated possible strangulated ventral hernia with small bowel obstruction S/p exploratory  laparotomy with primary ventral hernia repair, 12/22/16, DR. Cornett - drain d/c'd yesterday  Probable aspiration pneumonia - completed course of zosyn HTN VDRF 1/24-27/2018 Acute kidney injury Anemia Dementia Malnutrition - now on TPN (prealbumin 8.0 on 2/12)  FEN: TPN, full liquids ID: Zosyn 12/22/16 >>2/15 DVT: SCD/Lovenox  Plan:Abdomen nondistended today, good BS, is having flatus and patient tolerated full liquids. Can advance to soft diet if she tolerates full this morning. Continue TPN until tolerating adequate amount of diet. Daily miralax. Continue PT/mobilization. Continue abdominal binder.    LOS: 23 days    Jerre Simon , [facility] Surgery 12/31/2016, 8:14 AM Pager: [phone] Consults: [phone] Mon-Fri 7:00 am-4:30 pm Sat-Sun 7:00 am-11:30 am

## 2016-12-31 NOTE — Progress Notes (Signed)
PHARMACY - ADULT TOTAL PARENTERAL NUTRITION CONSULT NOTE   Pharmacy Consult for TPN Indication: prolonged ileus  Patient Measurements: Height: 5\' 4"  (162.6 cm) Weight: 216 lb (98 kg) IBW/kg (Calculated) : 54.7 TPN AdjBW (KG): 66 Body mass index is 37.08 kg/m.   Assessment: [name] admitted on 12/08/16.  Pharmacy consulted to dose TPN for prolonged ileus, started on 2/9.  Pt is post-op ex lap with repair of ventral hernia.  GI: Incarcerated possible strangulated ventral hernia with small bowel obstruction. S/p exploratory laparotomy with primary ventral hernia repair 12/22/16. Albumin 2.1, pre-albumin 8 Post op ileus; NG removed; last BM 2/15, +bowel sounds  Abdominal pain 2/11, per tube meds held   Pepcid per tube > change to TPN  Diet advanced to FLD 2/15- 100% breakfast, 0% lunch and dinner >> attempting soft this PM and hopefully discontinue TPN tomorrow.  Endo: DM PTA (on metformin PTA), on SSI, CBGs mostly < 160 Insulin requirements in the past 24 hours: 5 Lytes: CoCa 9.9, lytes wnl Renal: SCr 0.54 Pulm:OSA, VDRF 1/24- 12/22/16, now 2L [place] Cards: HTN, on metop 12.5 per tube bid Hepatobil: LFTs WNL, Trig 153 Neuro:dementia; pain  ID: Abx completed for aspiration pneumonia, cultures negative. Zosyn 2/7 >> 2/14  Best Practices: LMWH, pepcid per tube TPN Access: PICC placed for TPN 2/9 TPN start date: 2/9  Nutritional Goals  per RD recommendation on 2/15: KCal: 1750-1950 Protein: 85 - 95 g  Current Nutrition:  TPN at 60 with lipids FLD -100% breakfast, 0% lunch, 0% dinner Ensure 237mL TID added 2/16   Plan:  -Continue Clinimix E 5/15 at 80 ml/hr -Continue 20% lipid emulsion at 20 ml/hr -This provides 96 g of protein and 1843 kCals per day meeting 100% of protein and kCal needs -Add Pepcid in TPN -Trace elements and MVI every other day, next 2/16 -Monitor TPN labs -Will attempt to wean and dc TPN tomorrow if tolerates diet   Link SnufferJessica Laurence Crofford, PharmD, BCPS Clinical  Pharmacist Clinical phone 12/31/2016 until 3:30 PM- #[phone] After hours, please call #[phone] 12/31/2016 8:08 AM

## 2016-12-31 NOTE — Clinical Social Work Note (Signed)
CSW continuing to monitor patient's progress and will facilitate discharge back to [facility] when medically stable. [facility] director advised that patient not yet ready for discharge. Weekend handoff completed. Patient can return when ready as she is a bed hold.  Genelle BalVanessa Byron Peacock, MSW, LCSW Licensed Clinical Social Worker Clinical Social Work Department [facility] [phone]

## 2016-12-31 NOTE — Progress Notes (Signed)
PROGRESS NOTE    Adrienne [name]  WUJ:[id number] DOB: 09/23/1936 DOA: 12/08/2016 PCP: Londell MohPHARR,WALTER DAVIDSON, MD   Brief Narrative: [name] with pmhx significant for DM, HTN, OSA, and dementia who presented from her nursing home with abdominal distention and respiratory distress.Patient was found to have ileus vs SBO, high lactic acid. She was intubated on 1/24 to 1/27. [facility]. On 12/21/2016 however developed abdominal pain with significant emesis. KUB done was concerning for gastric outlet obstruction. CT abdomen and pelvis done 12/22/2016 was concerning for bowel strangulation and possible atelectasis and pneumonia. Patient kept nothing by mouth NG tube in place. General surgery consulted for further evaluation. Patient placed empirically on IV Zosyn.  Assessment & Plan:  #  Incarcerated possibly strangulated ventral hernia with small bowel obstruction: Status post exploratory laparotomy with primary ventral hernia repair 12/22/16.  -Gen. surgery consult appreciated. Patient developed postoperative ileus requiring NG tube placement. The NG tube was removed. Advancing to soft diet today by general surgery. Patient had one bowel movement yesterday. She continued to report abdominal discomfort  -Patient is now on TPN via PICC line placement. Wean TPN when patient has good oral intake. -Continue to provide supportive care.  #Possible aspiration pneumonia: Completed a course of IV Zosyn on 2/14.  #Hypertension: Monitor blood pressure. Blood pressure acceptable.  #Acute kidney injury on admission: Serum creatinine level improved.  #Acute blood loss anemia: Hemoglobin level stable. Continue to monitor.  #Dementia without behavioral disturbance: Continue supportive care. PT OT evaluation and treatment. Plan to discharge to SNF at the end of this hospitalization.  Principal Problem:   Strangulation of intestine (HCC) Active Problems:   SBO (small bowel  obstruction)   Acute on chronic respiratory failure with hypoxemia (HCC)   Acute renal failure (HCC)   Elevated lactic acid level   Metabolic acidosis   Ileus (HCC)   Ventilator dependent (HCC)   Pressure injury of skin   Oral phase dysphagia   DNR (do not resuscitate) discussion   Palliative care by specialist   Abdominal distention   Leukocytosis   Aspiration pneumonia of left lung (HCC)   Postoperative ileus (HCC)   Goals of care, counseling/discussion   Palliative care encounter  DVT prophylaxis: Lovenox subcutaneous Code Status: Full code Family Communication: No family present at bedside Disposition Plan: Likely discharge to SNF in 1 to 3 days    Consultants:   General surgery: Dr.Tsuei 12/08/2016  Wound care  Palliative care Lorinda CreedMary Larach, NP 12/19/2016  Procedures:  CT abdomen and pelvis 12/08/2006, 12/22/2016  Exploratory laparotomy with repair of ventral hernia. Antimicrobials:  IV Zosyn 12/22/2016>>>>>12/29/2016 Subjective: Patient was seen and examined at bedside. No new event. Advancing diet today. Denied chest pain, shortness of breath. Reported weakness. Objective: Vitals:   12/30/16 1844 12/30/16 1954 12/31/16 0348 12/31/16 0700  BP: (!) 142/64 (!) 142/46 (!) 143/53 (!) (P) 100/58  Pulse: 87 78 75 (P) 77  Resp: 20 20 18  (P) 18  Temp: 98 F (36.7 C) 98.3 F (36.8 C) 98 F (36.7 C) (P) 98.5 F (36.9 C)  TempSrc: Oral   (P) Oral  SpO2: 100% 100% 93% (P) 95%  Weight:  98 kg (216 lb)    Height:        Intake/Output Summary (Last 24 hours) at 12/31/16 1506 Last data filed at 12/31/16 0930  Gross per 24 hour  Intake           902.67 ml  Output  0 ml  Net           902.67 ml   Filed Weights   12/28/16 2022 12/29/16 2032 12/30/16 1954  Weight: 99.5 kg (219 lb 5.7 oz) 99.2 kg (218 lb 11.1 oz) 98 kg (216 lb)    Examination:  General exam: Not in distress, sitting on chair Respiratory system: Clear to auscultation. Respiratory  effort normal. No wheezing or crackle Cardiovascular system: S1 & S2 heard, RRR.  No pedal edema. Gastrointestinal system: Abdominal binder in place, the drain was removed. Positive bowel sound Central nervous system: Alert awake and following commands.  Extremities: Symmetric 5 x 5 power. Skin: No rashes, lesions or ulcers Psychiatry: Judgement and insight appear normal. Mood & affect appropriate.     Data Reviewed: I have personally reviewed following labs and imaging studies  CBC:  Recent Labs Lab 12/25/16 0414 12/27/16 0451 12/28/16 0432 12/29/16 0459  WBC 6.6 8.5 9.4 8.7  NEUTROABS 3.9 4.9  --   --   HGB 8.5* 9.1* 9.0* 9.0*  HCT 28.2* 28.9* 29.2* 28.3*  MCV 102.5* 99.7 103.2* 100.7*  PLT 219 194 188 165   Basic Metabolic Panel:  Recent Labs Lab 12/25/16 0414 12/26/16 0546 12/27/16 0451 12/28/16 0432 12/28/16 1020 12/29/16 0459 12/30/16 0938  NA 145 141 138 130* 136 136 137  K 3.7 3.6 3.8 5.2* 4.5 4.0 4.1  CL 113* 109 109 102 104 102 104  CO2 23 24 23 22 25 25 27   GLUCOSE 148* 165* 147* 560* 142* 165* 162*  BUN 13 10 8 10 13 13 15   CREATININE 0.70 0.61 0.58 0.64 0.68 0.65 0.54  CALCIUM 8.4* 8.4* 8.2* 8.1* 8.4* 8.3* 8.4*  MG 1.6* 1.5* 1.7 2.2  --   --  1.4*  PHOS 2.2* 3.5 3.5  --   --   --  3.7   GFR: Estimated Creatinine Clearance: 65.9 mL/min (by C-G formula based on SCr of 0.54 mg/dL). Liver Function Tests:  Recent Labs Lab 12/25/16 0414 12/27/16 0451 12/30/16 0938  AST 22 23 28   ALT 14 13* 16  ALKPHOS 43 43 46  BILITOT 0.7 0.3 0.7  PROT 5.7* 5.8* 6.0*  ALBUMIN 1.9* 1.9* 2.1*   No results for input(s): LIPASE, AMYLASE in the last 168 hours. No results for input(s): AMMONIA in the last 168 hours. Coagulation Profile: No results for input(s): INR, PROTIME in the last 168 hours. Cardiac Enzymes: No results for input(s): CKTOTAL, CKMB, CKMBINDEX, TROPONINI in the last 168 hours. BNP (last 3 results) No results for input(s): PROBNP in the last  8760 hours. HbA1C: No results for input(s): HGBA1C in the last 72 hours. CBG:  Recent Labs Lab 12/30/16 1947 12/30/16 2342 12/31/16 0345 12/31/16 0741 12/31/16 1139  GLUCAP 149* 154* 152* 145* 158*   Lipid Profile: No results for input(s): CHOL, HDL, LDLCALC, TRIG, CHOLHDL, LDLDIRECT in the last 72 hours. Thyroid Function Tests: No results for input(s): TSH, T4TOTAL, FREET4, T3FREE, THYROIDAB in the last 72 hours. Anemia Panel: No results for input(s): VITAMINB12, FOLATE, FERRITIN, TIBC, IRON, RETICCTPCT in the last 72 hours. Sepsis Labs: No results for input(s): PROCALCITON, LATICACIDVEN in the last 168 hours.  Recent Results (from the past 240 hour(s))  Culture, blood (routine x 2) Call MD if unable to obtain prior to antibiotics being given     Status: None   Collection Time: 12/22/16 11:13 AM  Result Value Ref Range Status   Specimen Description BLOOD LEFT HAND  Final   Special  Requests IN PEDIATRIC BOTTLE 1CC  Final   Culture NO GROWTH 5 DAYS  Final   Report Status 12/27/2016 FINAL  Final  Culture, blood (routine x 2) Call MD if unable to obtain prior to antibiotics being given     Status: None   Collection Time: 12/22/16 11:24 AM  Result Value Ref Range Status   Specimen Description BLOOD LEFT HAND  Final   Special Requests IN PEDIATRIC BOTTLE 3CC  Final   Culture NO GROWTH 5 DAYS  Final   Report Status 12/27/2016 FINAL  Final  Urine culture     Status: None   Collection Time: 12/23/16 11:41 PM  Result Value Ref Range Status   Specimen Description URINE, RANDOM  Final   Special Requests NONE  Final   Culture NO GROWTH  Final   Report Status 12/25/2016 FINAL  Final         Radiology Studies: No results found.      Scheduled Meds: . enoxaparin (LOVENOX) injection  40 mg Subcutaneous Q24H  . feeding supplement (ENSURE ENLIVE)  237 mL Oral BID BM  . insulin aspart  0-9 Units Subcutaneous Q4H  . mouth rinse  15 mL Mouth Rinse BID  . polyethylene glycol   17 g Oral Daily   Continuous Infusions: . Marland KitchenTPN (CLINIMIX-E) Adult 80 mL/hr (12/30/16 1740)  . Marland KitchenTPN (CLINIMIX-E) Adult     And  . fat emulsion       LOS: 23 days    Ferdie Bakken Jaynie Collins, MD [facility] Pager [phone]  If 7PM-7AM, please contact night-coverage [email or web address] Password [email or web address] 12/31/2016, 3:06 PM

## 2017-01-01 LAB — GLUCOSE, CAPILLARY
GLUCOSE-CAPILLARY: 148 mg/dL — AB (ref 65–99)
GLUCOSE-CAPILLARY: 152 mg/dL — AB (ref 65–99)
GLUCOSE-CAPILLARY: 128 mg/dL — AB (ref 65–99)
GLUCOSE-CAPILLARY: 99 mg/dL (ref 65–99)
Glucose-Capillary: 156 mg/dL — ABNORMAL HIGH (ref 65–99)
Glucose-Capillary: 160 mg/dL — ABNORMAL HIGH (ref 65–99)

## 2017-01-01 MED ORDER — PROSIGHT PO TABS
1.0000 | ORAL_TABLET | Freq: Every day | ORAL | Status: DC
  Administered 2017-01-02: 1 via ORAL
  Filled 2017-01-01: qty 1

## 2017-01-01 MED ORDER — INSULIN ASPART 100 UNIT/ML ~~LOC~~ SOLN
0.0000 [IU] | Freq: Three times a day (TID) | SUBCUTANEOUS | Status: DC
  Administered 2017-01-02: 1 [IU] via SUBCUTANEOUS

## 2017-01-01 MED ORDER — FAMOTIDINE 20 MG PO TABS
20.0000 mg | ORAL_TABLET | Freq: Two times a day (BID) | ORAL | Status: DC
  Administered 2017-01-01 – 2017-01-02 (×2): 20 mg via ORAL
  Filled 2017-01-01 (×2): qty 1

## 2017-01-01 NOTE — Discharge Instructions (Signed)
[facility]      [facility] °[phone] ° °OPEN ABDOMINAL SURGERY: POST OP INSTRUCTIONS ° °Always review your discharge instruction sheet given to you by the facility where your surgery was performed. ° °IF YOU HAVE DISABILITY OR FAMILY LEAVE FORMS, YOU MUST BRING THEM TO THE OFFICE FOR PROCESSING.  PLEASE DO NOT GIVE THEM TO YOUR DOCTOR. ° °1. A prescription for pain medication may be given to you upon discharge.  Take your pain medication as prescribed, if needed.  If narcotic pain medicine is not needed, then you may take acetaminophen (Tylenol) or ibuprofen (Advil) as needed. °2. Take your usually prescribed medications unless otherwise directed. °3. If you need a refill on your pain medication, please contact your pharmacy. They will contact our office to request authorization.  Prescriptions will not be filled after 5pm or on week-ends. °4. You should follow a light diet the first few days after arrival home, such as soup and crackers, pudding, etc.unless your doctor has advised otherwise. A high-fiber, low fat diet can be resumed as tolerated.   Be sure to include lots of fluids daily. Most patients will experience some swelling and bruising on the chest and neck area.  Ice packs will help.  Swelling and bruising can take several days to resolve °5. Most patients will experience some swelling and bruising in the area of the incision. Ice pack will help. Swelling and bruising can take several days to resolve..  °6. It is common to experience some constipation if taking pain medication after surgery.  Increasing fluid intake and taking a stool softener will usually help or prevent this problem from occurring.  A mild laxative (Milk of Magnesia or Miralax) should be taken according to package directions if there are no bowel movements after 48 hours. °7.  Any sutures or staples will be removed at the office during your follow-up visit. You may find that a light gauze bandage over your incision may  keep your staples from being rubbed or pulled. You may shower and replace the bandage daily. °8. ACTIVITIES:  You may resume regular (light) daily activities beginning the next day--such as daily self-care, walking, climbing stairs--gradually increasing activities as tolerated.  You may have sexual intercourse when it is comfortable.  Refrain from any heavy lifting or straining until approved by your doctor. °a. You may drive when you no longer are taking prescription pain medication, you can comfortably wear a seatbelt, and you can safely maneuver your car and apply brakes °9. You should see your doctor in the office for a follow-up appointment approximately two weeks after your surgery.  Make sure that you call for this appointment within a day or two after you arrive home to insure a convenient appointment time. °OTHER INSTRUCTIONS:  °_____________________________________________________________ °_____________________________________________________________ ° °WHEN TO CALL YOUR DOCTOR: °1. Fever over 101.0 °2. Inability to urinate °3. Nausea and/or vomiting °4. Extreme swelling or bruising °5. Continued bleeding from incision. °6. Increased pain, redness, or drainage from the incision. °7. Difficulty swallowing or breathing °8. Muscle cramping or spasms. °9. Numbness or tingling in hands or feet or around lips. ° °The clinic staff is available to answer your questions during regular business hours.  Please don’t hesitate to call and ask to speak to one of the nurses if you have concerns. ° °For further questions, please visit [email or web address] ° ° ° °

## 2017-01-01 NOTE — Progress Notes (Signed)
Patient ID: Adrienne [name], female   DOB: 1936-06-19, 81 y.o.   MRN: [id number]  [facility] Surgery Progress Note  10 Days Post-Op  Subjective: No complaints this morning. Denies any abdominal pain. Tolerating soft diet.   Objective: Vital signs in last 24 hours: Temp:  [98 F (36.7 C)-98.9 F (37.2 C)] 98.9 F (37.2 C) (02/17 0431) Pulse Rate:  [77-80] 77 (02/17 0431) Resp:  [18] 18 (02/17 0431) BP: (101-131)/(60-65) 103/60 (02/17 0431) SpO2:  [95 %-97 %] 95 % (02/17 0431) Weight:  [216 lb 7.9 oz (98.2 kg)] 216 lb 7.9 oz (98.2 kg) (02/16 2141) Last BM Date: 12/30/16  Intake/Output from previous day: 02/16 0701 - 02/17 0700 In: 1824 [P.O.:840; I.V.:984] Out: 0  Intake/Output this shift: No intake/output data recorded.  PE: Gen: Alert, NAD, pleasant Pulm: CTAB, effort normal Abd: Soft, nondistended, nontender, +BS, incision C/D/I with staples intact, previous drain site cdi with no drainage  Lab Results:  No results for input(s): WBC, HGB, HCT, PLT in the last 72 hours. BMET  Recent Labs  12/30/16 0938  NA 137  K 4.1  CL 104  CO2 27  GLUCOSE 162*  BUN 15  CREATININE 0.54  CALCIUM 8.4*   PT/INR No results for input(s): LABPROT, INR in the last 72 hours. CMP     Component Value Date/Time   NA 137 12/30/2016 0938   K 4.1 12/30/2016 0938   CL 104 12/30/2016 0938   CO2 27 12/30/2016 0938   GLUCOSE 162 (H) 12/30/2016 0938   BUN 15 12/30/2016 0938   CREATININE 0.54 12/30/2016 0938   CALCIUM 8.4 (L) 12/30/2016 0938   PROT 6.0 (L) 12/30/2016 0938   ALBUMIN 2.1 (L) 12/30/2016 0938   AST 28 12/30/2016 0938   ALT 16 12/30/2016 0938   ALKPHOS 46 12/30/2016 0938   BILITOT 0.7 12/30/2016 0938   GFRNONAA >60 12/30/2016 0938   GFRAA >60 12/30/2016 0938   Lipase     Component Value Date/Time   LIPASE 61 (H) 12/08/2016 4098       Studies/Results: No results found.  Anti-infectives: Anti-infectives    Start     Dose/Rate Route Frequency Ordered  Stop   12/29/16 0000  piperacillin-tazobactam (ZOSYN) IVPB 3.375 g     3.375 g 12.5 mL/hr over 240 Minutes Intravenous Every 8 hours 12/28/16 1931 12/29/16 2009   12/22/16 1100  piperacillin-tazobactam (ZOSYN) IVPB 3.375 g  Status:  Discontinued     3.375 g 12.5 mL/hr over 240 Minutes Intravenous Every 8 hours 12/22/16 1026 12/28/16 1931   12/08/16 1200  piperacillin-tazobactam (ZOSYN) IVPB 3.375 g  Status:  Discontinued     3.375 g 12.5 mL/hr over 240 Minutes Intravenous Every 8 hours 12/08/16 0717 12/11/16 0846   12/08/16 0345  vancomycin (VANCOCIN) IVPB 1000 mg/200 mL premix     1,000 mg 200 mL/hr over 60 Minutes Intravenous  Once 12/08/16 0333 12/08/16 0740   12/08/16 0345  piperacillin-tazobactam (ZOSYN) IVPB 3.375 g     3.375 g 100 mL/hr over 30 Minutes Intravenous  Once 12/08/16 1191 12/08/16 0741       Assessment/Plan Incarcerated possible strangulated ventral hernia with small bowel obstruction S/p exploratory laparotomy with primary ventral hernia repair, 12/22/16, DR. Cornett - POD 10  Probable aspiration pneumonia - completed course of zosyn HTN VDRF 1/24-27/2018 Acute kidney injury Anemia Dementia Malnutrition - prealbumin 8.0 on 2/12  FEN: regular diet ID: Zosyn 12/22/16 >>2/15 DVT: SCD/Lovenox  Plan:D/c TNA and advance to regular diet. Continue  daily miralax. Continue PT/mobilization. From surgical standpoint patient is [facility]. She will need staples removed next week.   LOS: 24 days    Edson SnowballBROOKE A MILLER , [facility] 01/01/2017, 8:18 AM Pager: [phone] Consults: [phone] Mon-Fri 7:00 am-4:30 pm Sat-Sun 7:00 am-11:30 am

## 2017-01-01 NOTE — Progress Notes (Signed)
PROGRESS NOTE    Adrienne [name]  ZOX:[id number] DOB: [birth year] DOA: 12/08/2016 PCP: [name], MD   Brief Narrative: [name] with pmhx significant for DM, HTN, OSA, and dementia who presented from her nursing home with abdominal distention and respiratory distress.Patient was found to have ileus vs SBO, high lactic acid. She was intubated on 1/24 to 1/27. Transferred to [facility] from [facility] on 12/13/2016. On 12/21/2016 however developed abdominal pain with significant emesis. KUB done was concerning for gastric outlet obstruction. CT abdomen and pelvis done 12/22/2016 was concerning for bowel strangulation and possible atelectasis and pneumonia. Patient kept nothing by mouth NG tube in place. General surgery consulted for further evaluation. Patient placed empirically on IV Zosyn.  Assessment & Plan:  #  Incarcerated possibly strangulated ventral hernia with small bowel obstruction: Status post exploratory laparotomy with primary ventral hernia repair 12/22/16.  -Gen. surgery consult appreciated. Patient developed postoperative ileus requiring NG tube placement. The NG tube was removed. Patient is having bowel movements and tolerating soft diet. The diet was advanced to regular by general surgery today. Plan to wean off TPN by pharmacist. Continue to provide supportive care. If patient clinically stable by tomorrow and able to wean down TPN, plan to transfer her care to SNF.  #Possible aspiration pneumonia: Completed a course of IV Zosyn on 2/14.  #Hypertension: Monitor blood pressure. Blood pressure acceptable.  #Acute kidney injury on admission: Serum creatinine level improved.  #Acute blood loss anemia: Hemoglobin level stable. Continue to monitor.  #Dementia without behavioral disturbance: Continue supportive care. PT OT evaluation and treatment. Plan to discharge to SNF at the end of this hospitalization.  Principal Problem:   Strangulation of intestine (HCC) Active Problems:  SBO (small bowel obstruction)   Acute on chronic respiratory failure with hypoxemia (HCC)   Acute renal failure (HCC)   Elevated lactic acid level   Metabolic acidosis   Ileus (HCC)   Ventilator dependent (HCC)   Pressure injury of skin   Oral phase dysphagia   DNR (do not resuscitate) discussion   Palliative care by specialist   Abdominal distention   Leukocytosis   Aspiration pneumonia of left lung (HCC)   Postoperative ileus (HCC)   Goals of care, counseling/discussion   Palliative care encounter  DVT prophylaxis: Lovenox subcutaneous Code Status: Full code Family Communication: No family present at bedside Disposition Plan: Likely discharge to SNF in 1 to 3 days    Consultants:   General surgery: Dr.Tsuei 12/08/2016  Wound care  Palliative care Lorinda Creed, NP 12/19/2016  Procedures:  CT abdomen and pelvis 12/08/2006, 12/22/2016  Exploratory laparotomy with repair of ventral hernia. Antimicrobials:  IV Zosyn 12/22/2016>>>>>12/29/2016 Subjective: Patient was seen and examined at bedside. No new event. Having bowel movement and able to tolerate diet. No nausea vomiting chest pain or abdominal pain. Objective: Vitals:   12/31/16 1752 12/31/16 2141 01/01/17 0431 01/01/17 0835  BP: 101/64 131/65 103/60 (!) 118/48  Pulse: 80 80 77 77  Resp: 18 18 18    Temp: 98 F (36.7 C) 98.5 F (36.9 C) 98.9 F (37.2 C) 98.4 F (36.9 C)  TempSrc: Oral Oral Oral Oral  SpO2: 97% 95% 95%   Weight:  98.2 kg (216 lb 7.9 oz)    Height:        Intake/Output Summary (Last 24 hours) at 01/01/17 1405 Last data filed at 01/01/17 1000  Gross per 24 hour  Intake             1684  ml  Output                0 ml  Net             1684 ml   Filed Weights   12/29/16 2032 12/30/16 1954 12/31/16 2141  Weight: 99.2 kg (218 lb 11.1 oz) 98 kg (216 lb) 98.2 kg (216 lb 7.9 oz)    Examination:  General exam: Not in distress, sitting on chair Respiratory system: Clear bilateral.  Respiratory effort normal. No wheezing or crackle Cardiovascular system: S1 & S2 heard, RRR.  No pedal edema. Gastrointestinal system: Abdomen soft, nontender, positive bowel sound. Central nervous system: Alert awake and following commands.  Extremities: Symmetric 5 x 5 power. Skin: No rashes, lesions or ulcers Psychiatry: Judgement and insight appear normal. Mood & affect appropriate.     Data Reviewed: I have personally reviewed following labs and imaging studies  CBC:  Recent Labs Lab 12/27/16 0451 12/28/16 0432 12/29/16 0459  WBC 8.5 9.4 8.7  NEUTROABS 4.9  --   --   HGB 9.1* 9.0* 9.0*  HCT 28.9* 29.2* 28.3*  MCV 99.7 103.2* 100.7*  PLT 194 188 165   Basic Metabolic Panel:  Recent Labs Lab 12/26/16 0546 12/27/16 0451 12/28/16 0432 12/28/16 1020 12/29/16 0459 12/30/16 0938  NA 141 138 130* 136 136 137  K 3.6 3.8 5.2* 4.5 4.0 4.1  CL 109 109 102 104 102 104  CO2 24 23 22 25 25 27   GLUCOSE 165* 147* 560* 142* 165* 162*  BUN 10 8 10 13 13 15   CREATININE 0.61 0.58 0.64 0.68 0.65 0.54  CALCIUM 8.4* 8.2* 8.1* 8.4* 8.3* 8.4*  MG 1.5* 1.7 2.2  --   --  1.4*  PHOS 3.5 3.5  --   --   --  3.7   GFR: Estimated Creatinine Clearance: 66 mL/min (by C-G formula based on SCr of 0.54 mg/dL). Liver Function Tests:  Recent Labs Lab 12/27/16 0451 12/30/16 0938  AST 23 28  ALT 13* 16  ALKPHOS 43 46  BILITOT 0.3 0.7  PROT 5.8* 6.0*  ALBUMIN 1.9* 2.1*   No results for input(s): LIPASE, AMYLASE in the last 168 hours. No results for input(s): AMMONIA in the last 168 hours. Coagulation Profile: No results for input(s): INR, PROTIME in the last 168 hours. Cardiac Enzymes: No results for input(s): CKTOTAL, CKMB, CKMBINDEX, TROPONINI in the last 168 hours. BNP (last 3 results) No results for input(s): PROBNP in the last 8760 hours. HbA1C: No results for input(s): HGBA1C in the last 72 hours. CBG:  Recent Labs Lab 12/31/16 2138 01/01/17 0030 01/01/17 0405  01/01/17 0747 01/01/17 1202  GLUCAP 180* 160* 148* 152* 156*   Lipid Profile: No results for input(s): CHOL, HDL, LDLCALC, TRIG, CHOLHDL, LDLDIRECT in the last 72 hours. Thyroid Function Tests: No results for input(s): TSH, T4TOTAL, FREET4, T3FREE, THYROIDAB in the last 72 hours. Anemia Panel: No results for input(s): VITAMINB12, FOLATE, FERRITIN, TIBC, IRON, RETICCTPCT in the last 72 hours. Sepsis Labs: No results for input(s): PROCALCITON, LATICACIDVEN in the last 168 hours.  Recent Results (from the past 240 hour(s))  Urine culture     Status: None   Collection Time: 12/23/16 11:41 PM  Result Value Ref Range Status   Specimen Description URINE, RANDOM  Final   Special Requests NONE  Final   Culture NO GROWTH  Final   Report Status 12/25/2016 FINAL  Final         Radiology Studies:  No results found.      Scheduled Meds: . enoxaparin (LOVENOX) injection  40 mg Subcutaneous Q24H  . famotidine  20 mg Oral BID  . feeding supplement (ENSURE ENLIVE)  237 mL Oral BID BM  . insulin aspart  0-9 Units Subcutaneous Q4H  . mouth rinse  15 mL Mouth Rinse BID  . [START ON 01/02/2017] multivitamin  1 tablet Oral Daily  . polyethylene glycol  17 g Oral Daily   Continuous Infusions: . Marland Kitchen.TPN (CLINIMIX-E) Adult Stopped (01/01/17 1241)     LOS: 24 days    Lillyana Majette Jaynie CollinsPrasad Micco Bourbeau, MD [facility] Pager [phone]  If 7PM-7AM, please contact night-coverage [email or web address] Password [email or web address] 01/01/2017, 2:05 PM

## 2017-01-01 NOTE — Progress Notes (Signed)
TPN decreased to 40 mL/hr per pharmacy.

## 2017-01-02 LAB — GLUCOSE, CAPILLARY
GLUCOSE-CAPILLARY: 118 mg/dL — AB (ref 65–99)
GLUCOSE-CAPILLARY: 97 mg/dL (ref 65–99)
Glucose-Capillary: 104 mg/dL — ABNORMAL HIGH (ref 65–99)
Glucose-Capillary: 140 mg/dL — ABNORMAL HIGH (ref 65–99)

## 2017-01-02 MED ORDER — ALPRAZOLAM 0.25 MG PO TABS: 0.2500 mg | ORAL_TABLET | Freq: Every evening | ORAL | 0 refills | Status: DC | PRN

## 2017-01-02 MED ORDER — ENSURE ENLIVE PO LIQD: 237.0000 mL | Freq: Two times a day (BID) | ORAL | 12 refills | Status: DC

## 2017-01-02 MED ORDER — INSULIN ASPART 100 UNIT/ML ~~LOC~~ SOLN: 0.0000 [IU] | Freq: Three times a day (TID) | SUBCUTANEOUS | 0 refills | Status: AC

## 2017-01-02 MED ORDER — MENTHOL 3 MG MT LOZG: 1.0000 | LOZENGE | OROMUCOSAL | 12 refills | Status: DC | PRN

## 2017-01-02 NOTE — Progress Notes (Signed)
Report given to Lurena Joinerebecca at [facility]. All questions answered.

## 2017-01-02 NOTE — Clinical Social Work Placement (Signed)
   CLINICAL SOCIAL WORK PLACEMENT  NOTE  Date:  01/02/2017  Patient Details  Name: Adrienne [name] MRN: [id number] Date of Birth: Oct 19, 1936  Clinical Social Work is seeking post-discharge placement for this patient at the Skilled  Nursing Facility level of care (*CSW will initial, date and re-position this form in  chart as items are completed):  Yes   Patient/family provided with [place] Clinical Social Work Department's list of facilities offering this level of care within the geographic area requested by the patient (or if unable, by the patient's family).  Yes   Patient/family informed of their freedom to choose among providers that offer the needed level of care, that participate in Medicare, Medicaid or managed care program needed by the patient, have an available bed and are willing to accept the patient.  Yes   Patient/family informed of [facility] ownership interest in [facility] and [facility], as well as of the fact that they are under no obligation to receive care at these facilities.  PASRR submitted to EDS on       PASRR number received on       Existing PASRR number confirmed on 01/02/17     FL2 transmitted to all facilities in geographic area requested by pt/family on       FL2 transmitted to all facilities within larger geographic area on       Patient informed that his/her managed care company has contracts with or will negotiate with certain facilities, including the following:        Yes   Patient/family informed of bed offers received.  [facility]  [facility]     Physician recommends and patient chooses bed at      Patient to be transferred to  (Pt from [facility]) on 01/02/17.  Patient to be transferred to facility by  Sharin Mons(PTAR)     Patient family notified on 01/02/17 of transfer.  Name of family member notified:  Dtr - No answer, VM left     PHYSICIAN       Additional Comment:     _______________________________________________ Norlene DuelBROWN, Rizwan Kuyper B, LCSWA 01/02/2017, 11:41 AM

## 2017-01-02 NOTE — Discharge Summary (Addendum)
Physician Discharge Summary  Adrienne [name] RUE:[id number] DOB: 10/13/1936 DOA: 12/08/2016  PCP: [name], MD  Admit date: 12/08/2016 Discharge date: 01/02/2017  Admitted From:SNF Disposition:  SNF  Recommendations for Outpatient Follow-up:  1. Follow up with PCP in 1-2 weeks 2. Please obtain BMP/CBC in one week 3. Please follow up with [place] [facility] for the removal of abdominal staples.  Home Health: SNF Equipment/Devices: No Discharge Condition: Stable CODE STATUS: Full code Diet recommendation: Heart healthy  Brief/Interim Summary: [name] with pmhx significant for DM, HTN, OSA, and dementia who presented from her nursing home with abdominal distention and respiratory distress.Patient was found to have ileus vs SBO, high lactic acid. She was intubated on 1/24 to 1/27.  On 12/21/2016 however developed abdominal pain with significant emesis. KUB done was concerning for gastric outlet obstruction. CT abdomen and pelvis done 12/22/2016 was concerning for bowel strangulation and possible atelectasis and pneumonia. Patient kept nothing by mouth NG tube in place. General surgery consulted for further evaluation. Patient placed empirically on IV Zosyn  #  Incarcerated possibly strangulated ventral hernia with small bowel obstruction: Status post exploratory laparotomy with primary ventral hernia repair 12/22/16.  -Gen. surgery consult appreciated. Patient developed postoperative ileus requiring NG tube placement. The NG tube was removed. Patient is tolerating regular diet, requiring feeding assistance. Has been having bowel movement. Discontinued TPN yesterday. Recommended to discharge the patient by general surgery with outpatient follow-up for abdominal staples removal.  -Today I discussed with patient's daughter [name] in detail. I discussed about the prognosis and plan of care. Also educated about the complication associated with abdominal surgery, bowel obstruction.  Recommended to follow up with general surgery. I spent about 15 minutes only during discussion with the patient's daughter over the phone.  #Possible aspiration pneumonia: Completed a course of IV Zosyn on 2/14. Clinically stable.  #Hypertension: Monitor blood pressure. Blood pressure acceptable.  #Acute kidney injury on admission: Serum creatinine level improved.  #Acute blood loss anemia: Hemoglobin level stable. Continue to monitor.  #Dementia without behavioral disturbance: Continue supportive care. PT OT evaluation and treatment.  Patient with a prolonged hospitalization with possible deconditioning. She is now slowly improving after her abdominal surgery. She is tolerating diet and having bowel movement. Okay to discharge to SNF as per general surgery with follow-up at the clinic for possible abdominal staples removal. I discussed this with patient's daughter in detail. I believe, patient benefited from inpatient hospitalization and medically stable to transfer her care to SNF for further management with close observation. I also discussed the CODE STATUS and prognosis with the patient's daughter. I even brought up the idea of following up with palliative care outpatient for possible discussion regarding hospice care. Patient's daughter verbalized understanding. Patient needs assistance during feeding. Consider palliative care consult at SNF.   Discharge Diagnoses:  Principal Problem:   Strangulation of intestine (HCC) Active Problems:   SBO (small bowel obstruction)   Acute on chronic respiratory failure with hypoxemia (HCC)   Acute renal failure (HCC)   Elevated lactic acid level   Metabolic acidosis   Ileus (HCC)   Ventilator dependent (HCC)   Pressure injury of skin   Oral phase dysphagia   DNR (do not resuscitate) discussion   Palliative care by specialist   Abdominal distention   Leukocytosis   Aspiration pneumonia of left lung (HCC)   Postoperative ileus (HCC)    Goals of care, counseling/discussion   Palliative care encounter    Discharge Instructions  Discharge  Instructions    Amb Referral to Palliative Care    Complete by:  As directed    Call MD for:  difficulty breathing, headache or visual disturbances    Complete by:  As directed    Call MD for:  extreme fatigue    Complete by:  As directed    Call MD for:  hives    Complete by:  As directed    Call MD for:  persistant dizziness or light-headedness    Complete by:  As directed    Call MD for:  persistant nausea and vomiting    Complete by:  As directed    Call MD for:  severe uncontrolled pain    Complete by:  As directed    Call MD for:  temperature >100.4    Complete by:  As directed    Diet - low sodium heart healthy    Complete by:  As directed    Increase activity slowly    Complete by:  As directed      Allergies as of 01/02/2017      Reactions   Prednisone Shortness Of Breath   Ibuprofen Other (See Comments)   Swelling mouth and lips   Meclizine Other (See Comments)   HALLUCINATIONS   Sulfonamide Derivatives    REACTION: Reaction not known      Medication List    STOP taking these medications   furosemide 20 MG tablet Commonly known as:  LASIX   losartan 50 MG tablet Commonly known as:  COZAAR   metFORMIN 500 MG tablet Commonly known as:  GLUCOPHAGE   senna-docusate 8.6-50 MG tablet Commonly known as:  Senokot-S   traMADol 50 MG tablet Commonly known as:  ULTRAM     TAKE these medications   acetaminophen 500 MG tablet Commonly known as:  TYLENOL Take 500 mg by mouth every 6 (six) hours as needed for pain.   ALPRAZolam 0.25 MG tablet Commonly known as:  XANAX Take 1 tablet (0.25 mg total) by mouth at bedtime as needed for anxiety. What changed:  when to take this  reasons to take this   aspirin EC 81 MG tablet Take 81 mg by mouth daily.   butalbital-acetaminophen-caffeine 50-325-40 MG tablet Commonly known as:  FIORICET, ESGIC Take 50  tablets by mouth every 4 (four) hours as needed for headache or migraine.   CALTRATE 600 PO Take 1 tablet by mouth 2 (two) times daily.   DERMACLOUD EX Apply 1 application topically every 2 (two) hours as needed (after each changing).   DULoxetine 60 MG capsule Commonly known as:  CYMBALTA Take 60 mg by mouth daily.   DYMISTA 137-50 MCG/ACT Susp Generic drug:  Azelastine-Fluticasone Place 137 mcg into alternate nostrils 2 (two) times daily.   feeding supplement Liqd Take 1 Container by mouth 3 (three) times daily between meals.   feeding supplement (ENSURE ENLIVE) Liqd Take 237 mLs by mouth 2 (two) times daily between meals.   fenofibrate 48 MG tablet Commonly known as:  TRICOR Take 48 mg by mouth every evening.   insulin aspart 100 UNIT/ML injection Commonly known as:  novoLOG Inject 0-9 Units into the skin 3 (three) times daily with meals.   loperamide 2 MG capsule Commonly known as:  IMODIUM Take 1 capsule (2 mg total) by mouth as needed for diarrhea or loose stools.   memantine 10 MG tablet Commonly known as:  NAMENDA Take 10 mg by mouth 2 (two) times daily.   menthol-cetylpyridinium 3  MG lozenge Commonly known as:  CEPACOL Take 1 lozenge (3 mg total) by mouth as needed for sore throat.   multivitamin-iron-minerals-folic acid Tabs tablet Take 1 tablet by mouth every evening.   DECUBI-VITE Caps Take 1 capsule by mouth every evening.   OLANZapine 2.5 MG tablet Commonly known as:  ZYPREXA Take 2.5 mg by mouth at bedtime.   ondansetron 8 MG tablet Commonly known as:  ZOFRAN Take 1 tablet (8 mg total) by mouth every 8 (eight) hours as needed for nausea or vomiting.   polyethylene glycol packet Commonly known as:  MIRALAX / GLYCOLAX Take 17 g by mouth daily as needed for mild constipation. What changed:  when to take this  reasons to take this   potassium chloride 20 MEQ/15ML (10%) Soln Take 15 mLs (20 mEq total) by mouth daily.   pravastatin 80 MG  tablet Commonly known as:  PRAVACHOL Take 80 mg by mouth every morning.   saccharomyces boulardii 250 MG capsule Commonly known as:  FLORASTOR Take 250 mg by mouth 2 (two) times daily.   solifenacin 10 MG tablet Commonly known as:  VESICARE Take 10 mg by mouth every evening.   triamcinolone ointment 0.1 % Commonly known as:  KENALOG Apply 1 application topically 2 (two) times daily as needed (infection in toe). What changed:  Another medication with the same name was removed. Continue taking this medication, and follow the directions you see here.   vitamin B-12 1000 MCG tablet Commonly known as:  CYANOCOBALAMIN Take 1,000 mcg by mouth every evening.   vitamin C 500 MG tablet Commonly known as:  ASCORBIC ACID Take 500 mg by mouth 2 (two) times daily.   VITAMIN D PO Take 4,000 tablets by mouth daily.   VOLTAREN 1 % Gel Generic drug:  diclofenac sodium Apply 2 g topically 4 (four) times daily as needed (pain).   zolpidem 5 MG tablet Commonly known as:  AMBIEN Take 5 mg by mouth at bedtime.            Durable Medical Equipment        Start     Ordered   12/21/16 1118  DME Oxygen  Once    Comments:  Please try to wean to room air  Question Answer Comment  Mode or (Route) Nasal cannula   Liters per Minute 2   Frequency Continuous (stationary and portable oxygen unit needed)   Oxygen conserving device Yes   Oxygen delivery system Gas      12/21/16 1118     Follow-up Information    [name], MD. Schedule an appointment as soon as possible for a visit in 1 week(s).   Specialty:  Internal Medicine Why:  Appointment Date: Please have family call the office to discuss a change in attending Doctor, this patient has not been seen at this office for three years. Thank you.  Contact information: [address] [place] [address] [phone]        Harriette Bouillon A., MD. Call in 2 week(s).   Specialty:  General Surgery Why:   Please call to make a follow-up appointment with Dr. Luisa Hart in 2 weeks. Contact information: [address] [place] [address] [phone]        [facility], [place]. Call on 01/04/2017.   Specialty:  General Surgery Why:  If SNF unable to remove abdominal staples on 01/04/17, please call our office Monday 01/03/17 to schedule a nurse clinic appointment to have staples  removed on 01/04/17. Contact information: [address] [place] [address] [phone]         Allergies  Allergen Reactions  . Prednisone Shortness Of Breath  . Ibuprofen Other (See Comments)    Swelling mouth and lips  . Meclizine Other (See Comments)    HALLUCINATIONS  . Sulfonamide Derivatives     REACTION: Reaction not known    Consultations: Consultants:   General surgery: Dr.Tsuei 12/08/2016  Wound care  Palliative care Lorinda Creed, NP 12/19/2016  Procedures:  CT abdomen and pelvis 12/08/2006, 12/22/2016  Exploratory laparotomy with repair of ventral hernia. Antimicrobials:  IV Zosyn 12/22/2016>>>>>12/29/2016   Subjective: Patient was seen and examined at bedside. Reported feeling better. Denied headache, dizziness, nausea, vomiting, chest pain, shortness of breath. Passing bowel. Tolerating diet well.  Discharge Exam: Vitals:   01/02/17 0414 01/02/17 0922  BP: (!) 120/39 (!) 138/53  Pulse: 76 78  Resp: 18 18  Temp: 98.6 F (37 C) 98 F (36.7 C)   Vitals:   01/01/17 1641 01/01/17 2010 01/02/17 0414 01/02/17 0922  BP: (!) 142/48 (!) 121/50 (!) 120/39 (!) 138/53  Pulse: 83 86 76 78  Resp: 18 18 18 18   Temp: 98.1 F (36.7 C) 99 F (37.2 C) 98.6 F (37 C) 98 F (36.7 C)  TempSrc: Oral Oral Oral Oral  SpO2: 92% 95% 97% 98%  Weight:  102.5 kg (226 lb)    Height:        General: Pt is alert, awake, not in acute distress Cardiovascular: RRR, S1/S2 +, no rubs, no gallops Respiratory: CTA bilaterally, no wheezing, no  rhonchi Abdominal: Bowel sound positive, abdominal dressing on. Not distended Extremities: no edema, no cyanosis Neurology: Alert, awake, following commands.   The results of significant diagnostics from this hospitalization (including imaging, microbiology, ancillary and laboratory) are listed below for reference.     Microbiology: Recent Results (from the past 240 hour(s))  Urine culture     Status: None   Collection Time: 12/23/16 11:41 PM  Result Value Ref Range Status   Specimen Description URINE, RANDOM  Final   Special Requests NONE  Final   Culture NO GROWTH  Final   Report Status 12/25/2016 FINAL  Final     Labs: BNP (last 3 results)  Recent Labs  12/08/16 0250  BNP 47.5   Basic Metabolic Panel:  Recent Labs Lab 12/27/16 0451 12/28/16 0432 12/28/16 1020 12/29/16 0459 12/30/16 0938  NA 138 130* 136 136 137  K 3.8 5.2* 4.5 4.0 4.1  CL 109 102 104 102 104  CO2 23 22 25 25 27   GLUCOSE 147* 560* 142* 165* 162*  BUN 8 10 13 13 15   CREATININE 0.58 0.64 0.68 0.65 0.54  CALCIUM 8.2* 8.1* 8.4* 8.3* 8.4*  MG 1.7 2.2  --   --  1.4*  PHOS 3.5  --   --   --  3.7   Liver Function Tests:  Recent Labs Lab 12/27/16 0451 12/30/16 0938  AST 23 28  ALT 13* 16  ALKPHOS 43 46  BILITOT 0.3 0.7  PROT 5.8* 6.0*  ALBUMIN 1.9* 2.1*   No results for input(s): LIPASE, AMYLASE in the last 168 hours. No results for input(s): AMMONIA in the last 168 hours. CBC:  Recent Labs Lab 12/27/16 0451 12/28/16 0432 12/29/16 0459  WBC 8.5 9.4 8.7  NEUTROABS 4.9  --   --   HGB 9.1* 9.0* 9.0*  HCT 28.9* 29.2* 28.3*  MCV  99.7 103.2* 100.7*  PLT 194 188 165   Cardiac Enzymes: No results for input(s): CKTOTAL, CKMB, CKMBINDEX, TROPONINI in the last 168 hours. BNP: Invalid input(s): POCBNP CBG:  Recent Labs Lab 01/01/17 1637 01/01/17 2015 01/02/17 0005 01/02/17 0411 01/02/17 0738  GLUCAP 128* 99 97 104* 118*   D-Dimer No results for input(s): DDIMER in the last 72  hours. Hgb A1c No results for input(s): HGBA1C in the last 72 hours. Lipid Profile No results for input(s): CHOL, HDL, LDLCALC, TRIG, CHOLHDL, LDLDIRECT in the last 72 hours. Thyroid function studies No results for input(s): TSH, T4TOTAL, T3FREE, THYROIDAB in the last 72 hours.  Invalid input(s): FREET3 Anemia work up No results for input(s): VITAMINB12, FOLATE, FERRITIN, TIBC, IRON, RETICCTPCT in the last 72 hours. Urinalysis    Component Value Date/Time   COLORURINE YELLOW 12/23/2016 2341   APPEARANCEUR CLOUDY (A) 12/23/2016 2341   LABSPEC 1.023 12/23/2016 2341   PHURINE 5.0 12/23/2016 2341   GLUCOSEU NEGATIVE 12/23/2016 2341   HGBUR LARGE (A) 12/23/2016 2341   BILIRUBINUR NEGATIVE 12/23/2016 2341   KETONESUR NEGATIVE 12/23/2016 2341   PROTEINUR 30 (A) 12/23/2016 2341   UROBILINOGEN 0.2 11/02/2013 1645   NITRITE NEGATIVE 12/23/2016 2341   LEUKOCYTESUR NEGATIVE 12/23/2016 2341   Sepsis Labs Invalid input(s): PROCALCITONIN,  WBC,  LACTICIDVEN Microbiology Recent Results (from the past 240 hour(s))  Urine culture     Status: None   Collection Time: 12/23/16 11:41 PM  Result Value Ref Range Status   Specimen Description URINE, RANDOM  Final   Special Requests NONE  Final   Culture NO GROWTH  Final   Report Status 12/25/2016 FINAL  Final     Time coordinating discharge: 33 minutes  SIGNED:   Maxie Barb, MD  [facility] 01/02/2017, 9:58 AM  If 7PM-7AM, please contact night-coverage [email or web address] Password [email or web address]

## 2017-01-02 NOTE — Clinical Social Work Note (Signed)
Clinical Social Worker facilitated patient discharge including contacting patient family (VM left for Dtr) and facility Toniann Fail([facility]) to confirm patient discharge plans.Clinical information faxed to facility and family agreeable with plan. CSW arranged ambulance transport via [facility] to [facility]. RN to call report [phone] prior to discharge.  Clinical Social Worker will sign off for now as social work intervention is no longer needed. Please consult us again if new need arises.  Ahnika Hannibal B. Gean QuintBrown,MSW, LCSWA Clinical Social Work Dept Weekend Social Worker [phone] 11:36 AM

## 2017-04-18 NOTE — Addendum Note (Signed)
Addendum  created 04/18/17 1108 by Val EagleMoser, Ladislaus Repsher, MD   Sign clinical note

## 2017-08-09 ENCOUNTER — Emergency Department (HOSPITAL_COMMUNITY): Payer: Medicare Other

## 2017-08-09 ENCOUNTER — Encounter (HOSPITAL_COMMUNITY): Payer: Self-pay | Admitting: Emergency Medicine

## 2017-08-09 ENCOUNTER — Inpatient Hospital Stay (HOSPITAL_COMMUNITY)
Admission: EM | Admit: 2017-08-09 | Discharge: 2017-08-15 | DRG: 311 | Disposition: A | Discharge status: Skilled Nursing Facility | Payer: Medicare Other | Attending: Internal Medicine | Admitting: Internal Medicine

## 2017-08-09 DIAGNOSIS — M79672 Pain in left foot: Secondary | ICD-10-CM | POA: Diagnosis present

## 2017-08-09 DIAGNOSIS — K7460 Unspecified cirrhosis of liver: Secondary | ICD-10-CM | POA: Diagnosis present

## 2017-08-09 DIAGNOSIS — F0390 Unspecified dementia without behavioral disturbance: Secondary | ICD-10-CM | POA: Diagnosis present

## 2017-08-09 DIAGNOSIS — L89153 Pressure ulcer of sacral region, stage 3: Secondary | ICD-10-CM | POA: Diagnosis present

## 2017-08-09 DIAGNOSIS — I110 Hypertensive heart disease with heart failure: Secondary | ICD-10-CM | POA: Diagnosis present

## 2017-08-09 DIAGNOSIS — R609 Edema, unspecified: Secondary | ICD-10-CM | POA: Diagnosis not present

## 2017-08-09 DIAGNOSIS — G894 Chronic pain syndrome: Secondary | ICD-10-CM | POA: Diagnosis present

## 2017-08-09 DIAGNOSIS — R7989 Other specified abnormal findings of blood chemistry: Secondary | ICD-10-CM | POA: Diagnosis present

## 2017-08-09 DIAGNOSIS — L89303 Pressure ulcer of unspecified buttock, stage 3: Secondary | ICD-10-CM

## 2017-08-09 DIAGNOSIS — E860 Dehydration: Secondary | ICD-10-CM

## 2017-08-09 DIAGNOSIS — F419 Anxiety disorder, unspecified: Secondary | ICD-10-CM | POA: Diagnosis present

## 2017-08-09 DIAGNOSIS — I5032 Chronic diastolic (congestive) heart failure: Secondary | ICD-10-CM | POA: Diagnosis present

## 2017-08-09 DIAGNOSIS — I248 Other forms of acute ischemic heart disease: Principal | ICD-10-CM | POA: Diagnosis present

## 2017-08-09 DIAGNOSIS — F329 Major depressive disorder, single episode, unspecified: Secondary | ICD-10-CM | POA: Diagnosis present

## 2017-08-09 DIAGNOSIS — R079 Chest pain, unspecified: Secondary | ICD-10-CM | POA: Diagnosis not present

## 2017-08-09 DIAGNOSIS — K5900 Constipation, unspecified: Secondary | ICD-10-CM | POA: Diagnosis present

## 2017-08-09 DIAGNOSIS — E11622 Type 2 diabetes mellitus with other skin ulcer: Secondary | ICD-10-CM | POA: Diagnosis present

## 2017-08-09 DIAGNOSIS — J811 Chronic pulmonary edema: Secondary | ICD-10-CM | POA: Diagnosis present

## 2017-08-09 DIAGNOSIS — N390 Urinary tract infection, site not specified: Secondary | ICD-10-CM | POA: Diagnosis present

## 2017-08-09 DIAGNOSIS — M8580 Other specified disorders of bone density and structure, unspecified site: Secondary | ICD-10-CM | POA: Diagnosis present

## 2017-08-09 DIAGNOSIS — K6289 Other specified diseases of anus and rectum: Secondary | ICD-10-CM | POA: Diagnosis present

## 2017-08-09 DIAGNOSIS — G4733 Obstructive sleep apnea (adult) (pediatric): Secondary | ICD-10-CM | POA: Diagnosis present

## 2017-08-09 DIAGNOSIS — M79609 Pain in unspecified limb: Secondary | ICD-10-CM | POA: Diagnosis not present

## 2017-08-09 DIAGNOSIS — Z96652 Presence of left artificial knee joint: Secondary | ICD-10-CM | POA: Diagnosis present

## 2017-08-09 DIAGNOSIS — M25552 Pain in left hip: Secondary | ICD-10-CM | POA: Diagnosis present

## 2017-08-09 DIAGNOSIS — Z8249 Family history of ischemic heart disease and other diseases of the circulatory system: Secondary | ICD-10-CM

## 2017-08-09 DIAGNOSIS — E7800 Pure hypercholesterolemia, unspecified: Secondary | ICD-10-CM | POA: Diagnosis present

## 2017-08-09 DIAGNOSIS — Z886 Allergy status to analgesic agent status: Secondary | ICD-10-CM

## 2017-08-09 DIAGNOSIS — I10 Essential (primary) hypertension: Secondary | ICD-10-CM | POA: Diagnosis present

## 2017-08-09 DIAGNOSIS — R109 Unspecified abdominal pain: Secondary | ICD-10-CM

## 2017-08-09 DIAGNOSIS — E785 Hyperlipidemia, unspecified: Secondary | ICD-10-CM | POA: Diagnosis present

## 2017-08-09 DIAGNOSIS — G9341 Metabolic encephalopathy: Secondary | ICD-10-CM | POA: Diagnosis present

## 2017-08-09 DIAGNOSIS — E039 Hypothyroidism, unspecified: Secondary | ICD-10-CM | POA: Diagnosis present

## 2017-08-09 DIAGNOSIS — F32A Depression, unspecified: Secondary | ICD-10-CM | POA: Diagnosis present

## 2017-08-09 DIAGNOSIS — Z515 Encounter for palliative care: Secondary | ICD-10-CM | POA: Diagnosis not present

## 2017-08-09 DIAGNOSIS — Z803 Family history of malignant neoplasm of breast: Secondary | ICD-10-CM

## 2017-08-09 DIAGNOSIS — L89319 Pressure ulcer of right buttock, unspecified stage: Secondary | ICD-10-CM | POA: Diagnosis present

## 2017-08-09 DIAGNOSIS — D72829 Elevated white blood cell count, unspecified: Secondary | ICD-10-CM | POA: Diagnosis present

## 2017-08-09 DIAGNOSIS — Z79899 Other long term (current) drug therapy: Secondary | ICD-10-CM

## 2017-08-09 DIAGNOSIS — Z6835 Body mass index (BMI) 35.0-35.9, adult: Secondary | ICD-10-CM

## 2017-08-09 DIAGNOSIS — I249 Acute ischemic heart disease, unspecified: Secondary | ICD-10-CM | POA: Diagnosis not present

## 2017-08-09 DIAGNOSIS — I350 Nonrheumatic aortic (valve) stenosis: Secondary | ICD-10-CM | POA: Diagnosis not present

## 2017-08-09 DIAGNOSIS — E669 Obesity, unspecified: Secondary | ICD-10-CM | POA: Diagnosis present

## 2017-08-09 DIAGNOSIS — Z7189 Other specified counseling: Secondary | ICD-10-CM | POA: Diagnosis not present

## 2017-08-09 DIAGNOSIS — R7881 Bacteremia: Secondary | ICD-10-CM | POA: Diagnosis not present

## 2017-08-09 DIAGNOSIS — Z9181 History of falling: Secondary | ICD-10-CM

## 2017-08-09 DIAGNOSIS — I4510 Unspecified right bundle-branch block: Secondary | ICD-10-CM | POA: Diagnosis present

## 2017-08-09 DIAGNOSIS — R52 Pain, unspecified: Secondary | ICD-10-CM

## 2017-08-09 DIAGNOSIS — Z800 Family history of malignant neoplasm of digestive organs: Secondary | ICD-10-CM

## 2017-08-09 DIAGNOSIS — Z882 Allergy status to sulfonamides status: Secondary | ICD-10-CM

## 2017-08-09 DIAGNOSIS — K8020 Calculus of gallbladder without cholecystitis without obstruction: Secondary | ICD-10-CM | POA: Diagnosis present

## 2017-08-09 DIAGNOSIS — I959 Hypotension, unspecified: Secondary | ICD-10-CM | POA: Diagnosis present

## 2017-08-09 DIAGNOSIS — J9621 Acute and chronic respiratory failure with hypoxia: Secondary | ICD-10-CM | POA: Diagnosis present

## 2017-08-09 DIAGNOSIS — Z7982 Long term (current) use of aspirin: Secondary | ICD-10-CM

## 2017-08-09 DIAGNOSIS — Z794 Long term (current) use of insulin: Secondary | ICD-10-CM

## 2017-08-09 DIAGNOSIS — L89329 Pressure ulcer of left buttock, unspecified stage: Secondary | ICD-10-CM | POA: Diagnosis present

## 2017-08-09 DIAGNOSIS — E119 Type 2 diabetes mellitus without complications: Secondary | ICD-10-CM

## 2017-08-09 DIAGNOSIS — Z90710 Acquired absence of both cervix and uterus: Secondary | ICD-10-CM

## 2017-08-09 LAB — CBC WITH DIFFERENTIAL/PLATELET
BASOS ABS: 0 10*3/uL (ref 0.0–0.1)
BASOS PCT: 0 %
Eosinophils Absolute: 0.2 10*3/uL (ref 0.0–0.7)
Eosinophils Relative: 1 %
HEMATOCRIT: 39.8 % (ref 36.0–46.0)
HEMOGLOBIN: 13.1 g/dL (ref 12.0–15.0)
LYMPHS PCT: 16 %
Lymphs Abs: 2.3 10*3/uL (ref 0.7–4.0)
MCH: 32.6 pg (ref 26.0–34.0)
MCHC: 32.9 g/dL (ref 30.0–36.0)
MCV: 99 fL (ref 78.0–100.0)
MONO ABS: 0.4 10*3/uL (ref 0.1–1.0)
MONOS PCT: 3 %
NEUTROS ABS: 11.1 10*3/uL — AB (ref 1.7–7.7)
NEUTROS PCT: 80 %
Platelets: 225 10*3/uL (ref 150–400)
RBC: 4.02 MIL/uL (ref 3.87–5.11)
RDW: 16.1 % — AB (ref 11.5–15.5)
WBC: 14.1 10*3/uL — ABNORMAL HIGH (ref 4.0–10.5)

## 2017-08-09 LAB — LACTIC ACID, PLASMA
Lactic Acid, Venous: 4.7 mmol/L (ref 0.5–1.9)
Lactic Acid, Venous: 3.2 mmol/L (ref 0.5–1.9)

## 2017-08-09 LAB — COMPREHENSIVE METABOLIC PANEL
ALBUMIN: 3.2 g/dL — AB (ref 3.5–5.0)
ALK PHOS: 62 U/L (ref 38–126)
ALT: 28 U/L (ref 14–54)
AST: 56 U/L — AB (ref 15–41)
Anion gap: 14 (ref 5–15)
BILIRUBIN TOTAL: 0.6 mg/dL (ref 0.3–1.2)
BUN: 18 mg/dL (ref 6–20)
CHLORIDE: 103 mmol/L (ref 101–111)
CO2: 19 mmol/L — AB (ref 22–32)
Calcium: 9.5 mg/dL (ref 8.9–10.3)
Creatinine, Ser: 1.2 mg/dL — ABNORMAL HIGH (ref 0.44–1.00)
GFR calc Af Amer: 48 mL/min — ABNORMAL LOW (ref >60)
GFR calc non Af Amer: 42 mL/min — ABNORMAL LOW (ref >60)
GLUCOSE: 227 mg/dL — AB (ref 65–99)
POTASSIUM: 4.1 mmol/L (ref 3.5–5.1)
Sodium: 136 mmol/L (ref 135–145)
Total Protein: 8.7 g/dL — ABNORMAL HIGH (ref 6.5–8.1)

## 2017-08-09 LAB — URINALYSIS, ROUTINE W REFLEX MICROSCOPIC
Bilirubin Urine: NEGATIVE
GLUCOSE, UA: NEGATIVE mg/dL
HGB URINE DIPSTICK: NEGATIVE
KETONES UR: NEGATIVE mg/dL
LEUKOCYTES UA: NEGATIVE
Nitrite: NEGATIVE
PROTEIN: 30 mg/dL — AB
Specific Gravity, Urine: 1.03 (ref 1.005–1.030)
pH: 5 (ref 5.0–8.0)

## 2017-08-09 LAB — TROPONIN I
Troponin I: 0.46 ng/mL (ref <0.03)
Troponin I: 0.46 ng/mL (ref <0.03)
Troponin I: 0.49 ng/mL (ref <0.03)
Troponin I: 0.48 ng/mL (ref <0.03)
Troponin I: 0.52 ng/mL (ref <0.03)

## 2017-08-09 LAB — GLUCOSE, CAPILLARY
GLUCOSE-CAPILLARY: 159 mg/dL — AB (ref 65–99)
Glucose-Capillary: 180 mg/dL — ABNORMAL HIGH (ref 65–99)

## 2017-08-09 LAB — HEMOGLOBIN A1C
HEMOGLOBIN A1C: 6.9 % — AB (ref 4.8–5.6)
MEAN PLASMA GLUCOSE: 151.33 mg/dL

## 2017-08-09 LAB — MAGNESIUM: Magnesium: 1.5 mg/dL — ABNORMAL LOW (ref 1.7–2.4)

## 2017-08-09 LAB — TSH: TSH: 3.314 u[IU]/mL (ref 0.350–4.500)

## 2017-08-09 LAB — CBG MONITORING, ED: GLUCOSE-CAPILLARY: 159 mg/dL — AB (ref 65–99)

## 2017-08-09 MED ORDER — OLANZAPINE 5 MG PO TABS
2.5000 mg | ORAL_TABLET | Freq: Every day | ORAL | Status: DC
  Administered 2017-08-09: 2.5 mg via ORAL
  Filled 2017-08-09: qty 1

## 2017-08-09 MED ORDER — INSULIN ASPART 100 UNIT/ML ~~LOC~~ SOLN: 0.0000 [IU] | Freq: Three times a day (TID) | SUBCUTANEOUS | Status: DC

## 2017-08-09 MED ORDER — MEMANTINE HCL 10 MG PO TABS
10.0000 mg | ORAL_TABLET | Freq: Two times a day (BID) | ORAL | Status: DC
  Administered 2017-08-09: 10 mg via ORAL
  Filled 2017-08-09: qty 1

## 2017-08-09 MED ORDER — INSULIN GLARGINE 100 UNIT/ML ~~LOC~~ SOLN
12.0000 [IU] | Freq: Every day | SUBCUTANEOUS | Status: DC
  Administered 2017-08-09 – 2017-08-14 (×6): 12 [IU] via SUBCUTANEOUS
  Filled 2017-08-09 (×9): qty 0.12

## 2017-08-09 MED ORDER — ONDANSETRON HCL 4 MG/2ML IJ SOLN: 4.0000 mg | Freq: Four times a day (QID) | INTRAMUSCULAR | Status: DC | PRN

## 2017-08-09 MED ORDER — BOOST / RESOURCE BREEZE PO LIQD
1.0000 | Freq: Three times a day (TID) | ORAL | Status: DC
  Administered 2017-08-09 – 2017-08-15 (×14): 1 via ORAL

## 2017-08-09 MED ORDER — ONDANSETRON HCL 4 MG PO TABS: 4.0000 mg | ORAL_TABLET | Freq: Four times a day (QID) | ORAL | Status: DC | PRN

## 2017-08-09 MED ORDER — LEVALBUTEROL HCL 0.63 MG/3ML IN NEBU
0.6300 mg | INHALATION_SOLUTION | Freq: Four times a day (QID) | RESPIRATORY_TRACT | Status: DC | PRN
  Administered 2017-08-12: 0.63 mg via RESPIRATORY_TRACT
  Filled 2017-08-09: qty 3

## 2017-08-09 MED ORDER — INSULIN ASPART 100 UNIT/ML ~~LOC~~ SOLN
0.0000 [IU] | Freq: Three times a day (TID) | SUBCUTANEOUS | Status: DC
  Administered 2017-08-09 (×2): 2 [IU] via SUBCUTANEOUS
  Administered 2017-08-10: 3 [IU] via SUBCUTANEOUS
  Administered 2017-08-10: 2 [IU] via SUBCUTANEOUS
  Administered 2017-08-10: 3 [IU] via SUBCUTANEOUS
  Administered 2017-08-11 – 2017-08-15 (×9): 1 [IU] via SUBCUTANEOUS
  Filled 2017-08-09: qty 1

## 2017-08-09 MED ORDER — DULOXETINE HCL 30 MG PO CPEP
30.0000 mg | ORAL_CAPSULE | Freq: Every day | ORAL | Status: DC
  Administered 2017-08-09 – 2017-08-15 (×7): 30 mg via ORAL
  Filled 2017-08-09 (×7): qty 1

## 2017-08-09 MED ORDER — ASPIRIN EC 81 MG PO TBEC
81.0000 mg | DELAYED_RELEASE_TABLET | Freq: Every day | ORAL | Status: DC
  Administered 2017-08-09 – 2017-08-15 (×7): 81 mg via ORAL
  Filled 2017-08-09 (×7): qty 1

## 2017-08-09 MED ORDER — FENTANYL CITRATE (PF) 100 MCG/2ML IJ SOLN
25.0000 ug | INTRAMUSCULAR | Status: DC | PRN
  Administered 2017-08-09 – 2017-08-10 (×5): 25 ug via INTRAVENOUS
  Filled 2017-08-09 (×5): qty 2

## 2017-08-09 MED ORDER — FENTANYL CITRATE (PF) 100 MCG/2ML IJ SOLN
50.0000 ug | Freq: Once | INTRAMUSCULAR | Status: AC
  Administered 2017-08-09: 50 ug via INTRAVENOUS
  Filled 2017-08-09: qty 2

## 2017-08-09 MED ORDER — IOPAMIDOL (ISOVUE-300) INJECTION 61%
INTRAVENOUS | Status: AC
  Administered 2017-08-09: 80 mL
  Filled 2017-08-09: qty 100

## 2017-08-09 MED ORDER — SACCHAROMYCES BOULARDII 250 MG PO CAPS
250.0000 mg | ORAL_CAPSULE | Freq: Two times a day (BID) | ORAL | Status: DC
  Administered 2017-08-09 – 2017-08-15 (×12): 250 mg via ORAL
  Filled 2017-08-09 (×12): qty 1

## 2017-08-09 MED ORDER — ALPRAZOLAM 0.25 MG PO TABS
0.2500 mg | ORAL_TABLET | Freq: Every evening | ORAL | Status: DC | PRN
  Administered 2017-08-09 – 2017-08-10 (×2): 0.25 mg via ORAL
  Filled 2017-08-09 (×2): qty 1

## 2017-08-09 MED ORDER — HEPARIN BOLUS VIA INFUSION
4000.0000 [IU] | Freq: Once | INTRAVENOUS | Status: AC
  Administered 2017-08-09: 4000 [IU] via INTRAVENOUS
  Filled 2017-08-09: qty 4000

## 2017-08-09 MED ORDER — POLYETHYLENE GLYCOL 3350 17 G PO PACK: 17.0000 g | PACK | Freq: Every day | ORAL | Status: DC | PRN

## 2017-08-09 MED ORDER — ACETAMINOPHEN 325 MG PO TABS
650.0000 mg | ORAL_TABLET | Freq: Four times a day (QID) | ORAL | Status: DC | PRN
  Administered 2017-08-09 – 2017-08-15 (×7): 650 mg via ORAL
  Filled 2017-08-09 (×7): qty 2

## 2017-08-09 MED ORDER — SODIUM CHLORIDE 0.9 % IV BOLUS (SEPSIS)
1500.0000 mL | Freq: Once | INTRAVENOUS | Status: AC
  Administered 2017-08-09: 1500 mL via INTRAVENOUS

## 2017-08-09 MED ORDER — ENOXAPARIN SODIUM 40 MG/0.4ML ~~LOC~~ SOLN: 40.0000 mg | SUBCUTANEOUS | Status: DC

## 2017-08-09 MED ORDER — ACETAMINOPHEN 650 MG RE SUPP: 650.0000 mg | Freq: Four times a day (QID) | RECTAL | Status: DC | PRN

## 2017-08-09 MED ORDER — SODIUM CHLORIDE 0.9 % IV BOLUS (SEPSIS)
500.0000 mL | Freq: Once | INTRAVENOUS | Status: AC
  Administered 2017-08-09: 500 mL via INTRAVENOUS

## 2017-08-09 MED ORDER — ASPIRIN EC 81 MG PO TBEC: 81.0000 mg | DELAYED_RELEASE_TABLET | Freq: Every day | ORAL | Status: DC

## 2017-08-09 MED ORDER — SODIUM CHLORIDE 0.9 % IV SOLN
INTRAVENOUS | Status: DC
  Administered 2017-08-09 – 2017-08-10 (×3): via INTRAVENOUS

## 2017-08-09 MED ORDER — POTASSIUM CHLORIDE 20 MEQ/15ML (10%) PO SOLN
20.0000 meq | Freq: Every day | ORAL | Status: DC
Start: 2017-08-09 — End: 2017-08-15
  Administered 2017-08-09 – 2017-08-15 (×7): 20 meq via ORAL
  Filled 2017-08-09 (×7): qty 15

## 2017-08-09 MED ORDER — HEPARIN (PORCINE) IN NACL 100-0.45 UNIT/ML-% IJ SOLN
1100.0000 [IU]/h | INTRAMUSCULAR | Status: DC
  Administered 2017-08-09: 900 [IU]/h via INTRAVENOUS
  Administered 2017-08-10: 1100 [IU]/h via INTRAVENOUS
  Filled 2017-08-09 (×2): qty 250

## 2017-08-09 NOTE — Clinical Social Work Note (Addendum)
Clinical Social Work Assessment  Patient Details  Name: KYNADI DRAGOS MRN: [id number] Date of Birth: 1936-07-13  Date of referral:  08/09/17               Reason for consult:  Discharge Planning                Permission sought to share information with:  Case Manager, Facility Medical sales representative, Family Supports Permission granted to share information::  Yes, Verbal Permission Granted  Name::        Agency::  [facility], LTC resident  Relationship::  Daughter Market researcher Information:     Housing/Transportation Living arrangements for the past 2 months:  Skilled Building surveyor of Information:  Patient, Medical Team, Case Manager, Facility, Adult Children Patient Interpreter Needed:  None Criminal Activity/Legal Involvement Pertinent to Current Situation/Hospitalization:  No - Comment as needed Significant Relationships:  Adult Children, Other Family Members, Community Support Lives with:  Facility Resident Do you feel safe going back to the place where you live?  Yes Need for family participation in patient care:  Yes (Comment) (somewhat confused in ED)  Care giving concerns:  Patient seen and assessed in the ED upon being admitted to unit. Pt complaining of rectal pain, upper abdominal pain, has hypotension. Patient is from LTC [facility] where she has been a resident for over three years. Patient last admission was at [facility] in February of this year.   Facility is aware of admission and reports no barriers for return.  2:49 PM Daughter returned call and notified of admission and bed placement. She reports she will be back at the hospital this evening.  Daughter confirms patient is from [facility], with plans to return. Happy with care and reports facility is helpful with communication as patient is a poor historian.  Daughter report patient is in a wheelchair most of the day. Reports nursing home has been treating cellulitis and patient was on antibiotics for  about a week and then facility has just been monitoring. Daughter reports she feels her rectum pain could be from a bed sore vs her rectum.  Reports patient may be scratching at place when going to the bathroom.  Daughter is not aware of any falls at facility nor bruising.   Daughter pays privately at nursing facility.   Patient has paid through the month for her bed.  Daughter plans for her to return at discharge.   Social Worker assessment / plan:  Assessment and consult completed. Patient is from LTC facility.   Call placed to daughter to identify any other concerns or questions regarding discharge plan.  No answer, however message left for call back. Will address other issues once call returned.  At this time, will anticipate patient to return to LTC at [facility]. Will update FL2 once appropriate as patient just admitted and work up being completed. Will continue to follow and assist with other needs.  Plan: SNF return.  Employment status:  Retired Health and safety inspector:  Medicare PT Recommendations:  Not assessed at this time Information / Referral to community resources:     Patient/Family's Response to care:  Patient understands she is in the hospital, but disoriented and complaining of pain. No family at bedside when seen. But called.  Patient/Family's Understanding of and Emotional Response to Diagnosis, Current Treatment, and Prognosis:  Currently patient complains of pain.  Call placed to daughter to identify any other issues or support warranted.   Emotional Assessment Appearance:  Appears stated age Attitude/Demeanor/Rapport:  Affect (typically observed):  Pleasant, Other (confused in ED) Orientation:  Oriented to Self, Oriented to Place Alcohol / Substance use:  Not Applicable Psych involvement (Current and /or in the community):  No (Comment)  Discharge Needs  Concerns to be addressed:  No discharge needs identified Readmission within the last 30 days:   No Current discharge risk:  None Barriers to Discharge:  No Barriers Identified, Continued Medical Work up   Raye Sorrow, LCSW 08/09/2017, 2:36 PM

## 2017-08-09 NOTE — Progress Notes (Signed)
ANTICOAGULATION CONSULT NOTE - Initial Consult  Pharmacy Consult for IV heparin Indication: chest pain/ACS  Allergies  Allergen Reactions  . Prednisone Shortness Of Breath  . Ibuprofen Other (See Comments)    Swelling mouth and lips  . Meclizine Other (See Comments)    HALLUCINATIONS  . Sulfonamide Derivatives     REACTION: Reaction not known    Patient Measurements: Height:  (162.6 cm) Weight: 200 lb (90.7 kg) IBW/kg (Calculated) : 54.7 Heparin Dosing Weight: 75  Vital Signs: Temp: 98.6 F (37 C) (09/25 0405) Temp Source: Rectal (09/25 0405) BP: 113/67 (09/25 0745) Pulse Rate: 91 (09/25 0745)  Labs:  Recent Labs  08/09/17 0422 08/09/17 0645  HGB 13.1  --   HCT 39.8  --   PLT 225  --   CREATININE 1.20*  --   TROPONINI 0.46* 0.46*    Estimated Creatinine Clearance: 41.5 mL/min (A) (by C-G formula based on SCr of 1.2 mg/dL (H)).   Medical History: Past Medical History:  Diagnosis Date  . Anxiety   . Depression   . Diabetes mellitus    ORAL MED  . Frequency of urination   . Headache(784.0) 10/23/2013  . Hypercholesterolemia   . Hypertension   . Hypothyroidism    STATES SHE NO LONGER NEEDS THYROID SUPPLEMENT  . Nocturia   . Normal nuclear stress test Jan 2012   No ischemia. EF 81%  . Obesity   . Osteoarthritis (arthritis due to wear and tear of joints)    PAIN AND OA BOTH KNEES  . Osteopenia   . Shortness of breath    WITH EXERTION  . Sleep apnea    DOES NOT USE CPAP-UNABLE TO TOLERATE  . Urinary frequency     Medications:  Scheduled:  Infusions:   Assessment: 81 yo female presented to ER with hypotension and rectal pain to start IV heparin for possible ACS. Baseline labs drawn  Goal of Therapy:  Heparin level 0.3-0.7 units/ml Monitor platelets by anticoagulation protocol: Yes   Plan:  1) IV heparin 4000 unit bolus then 2) IV heparin 900 units/hr 3) check heparin level 8 hours after start of heparin 4) daily heparin level and  CBC   Hessie Knows, PharmD, BCPS Pager [phone] 08/09/2017 9:00 AM

## 2017-08-09 NOTE — ED Notes (Signed)
Call report to [place], RN at [phone] at [phone]

## 2017-08-09 NOTE — ED Provider Notes (Addendum)
Patient reports she's been having rectal pain and periumbilical abdominal pain and decreased oral intake.on exam of her abdomen she has a lower midline surgical scar that she cannot tell me what was done there.  Patient is a pleasant elderly female who answers a lot of questions but sometimes does seem confused. Patient's mucous membranes are noted to be extremely dry. Patient's heart rate is normal. Abdomen is soft without apparent localization of pain. She has this lower midline surgical scar that is old. Patient skin is pale.  Plan is to give patient IV fluids and improve her blood pressure. And then we will pursue valuation of her abdominal pain.  Review of her past records shows she had a exploratory lap done in Feb for hernia repair and adhesions.   05:00 BP is 105 systolic, pt is alert and has improved color.   Medical screening examination/treatment/procedure(s) were conducted as a shared visit with non-physician practitioner(s) and myself.  I personally evaluated the patient during the encounter.  #1  EKG Interpretation  Date/Time:  Tuesday August 09 2017 04:10:41 EDT Ventricular Rate:  89 PR Interval:    QRS Duration: 136 QT Interval:  430 QTC Calculation: 524 R Axis:   75 Text Interpretation:  Sinus rhythm Nonspecific intraventricular conduction delay Borderline repol abnormality, diffuse leads Since last tracing 08 Dec 2016 ST more depressed in lateral leads Confirmed by Devoria Albe ([id number]) on 08/09/2017 4:16:49 AM      #2   EKG Interpretation  Date/Time:  Tuesday August 09 2017 06:41:25 EDT Ventricular Rate:  91 PR Interval:    QRS Duration: 118 QT Interval:  431 QTC Calculation: 531 R Axis:   53 Text Interpretation:  Sinus rhythm Probable left atrial enlargement Incomplete right bundle branch block Low voltage, precordial leads Nonspecific T abnormalities, lateral leads No significant change since last tracing HOURS EARLIER Confirmed by Devoria Albe ([id number]) on  08/09/2017 6:48:57 AM       Devoria Albe, MD, Concha Pyo, MD 08/09/17 8295    Devoria Albe, MD 08/09/17 [phone]

## 2017-08-09 NOTE — ED Notes (Addendum)
Attempted to stick patient for pending labs. Attempt was unsuccessful.  Main phleb. notified

## 2017-08-09 NOTE — Consult Note (Signed)
Cardiology Consultation:   [name] ID: Adrienne [name]; [id number]; 1936-08-19   Admit date: 08/09/2017 Date of Consult: 08/09/2017  Primary Care Provider: Merri Brunette, MD Primary Cardiologist: Dr. [place] last seen 2013  Primary Electrophysiologist:  NA   [name] Profile:   Adrienne [name] is a 81 y.o. female with a hx of HTN, obesity, DM and HLD but no know CAD with normal stress test in 2012 who is being seen today for Adrienne evaluation of hypotension and elevated troponin at Adrienne request of Dr. Susie Cassette.  History of Present Illness:   Adrienne [name] a hx of HTN, obesity, DM, dementia and HLD but no know CAD with normal stress test in 2012 now presents to ER form [facility] with complaints of rectal and abd pain.  Adrienne [name] is also hypotensive.   Adrienne [name] had decreased oral intake.  In ER IV fluids given with improved BP.  EKG  SR with ST depression increased mildly from 2014.  I personally reviewed. Troponin 0.46; 0.46  Lactic acid elevated at 4.7; 3.2  WBC 14.1 with normal Hgb 13.1 Na 136, K+ 4.1, C02 19, Cr 1.20, ASt 56,  CXR Stable mild cardiomegaly. Central pulmonary vascular prominence without overt CHF.  CT of abd with cholelithiasis without inflammation, nodular hepatic contours are noted suggesting hepatic cirrhosis, Diverticulosis of transverse and descending colon is noted without inflammation, Aortic atherosclerosis  Echo 2014 with EF 55-60%, moderate LVH G1DD.   Currently Adrienne [name] has some Lt hip pain, Adrienne [name] is lying on Lt side.  Adrienne [name] stated Adrienne [name] fell a few weeks ago.  Adrienne [name] has some episodic chest pain and SOB, but nothing consistent.   No pain currently no SOB.    Past Medical History:  Diagnosis Date  . Anxiety   . Depression   . Diabetes mellitus    ORAL MED  . Frequency of urination   . Headache(784.0) 10/23/2013  . Hypercholesterolemia   . Hypertension   . Hypothyroidism    STATES Adrienne [name] NO LONGER NEEDS THYROID SUPPLEMENT  . Nocturia   . Normal nuclear stress test Jan 2012   No ischemia. EF 81%  . Obesity   . Osteoarthritis (arthritis due to wear and tear of joints)    PAIN AND OA BOTH KNEES  . Osteopenia   . Shortness of breath    WITH EXERTION  . Sleep apnea    DOES NOT USE CPAP-UNABLE TO TOLERATE  . Urinary frequency     Past Surgical History:  Procedure Laterality Date  . ABDOMINAL HYSTERECTOMY    . BILATERAL OOPHORECTOMY    . CARDIAC CATHETERIZATION  02/03/1999   EF 70%  . CARDIOVASCULAR STRESS TEST  11/30/2010   EF 81%, NORMAL  . LAPAROTOMY N/A 12/22/2016   Procedure: EXPLORATORY LAPAROTOMY;  Surgeon: Harriette Bouillon, MD;  Location: [facility] OR;  Service: General;  Laterality: N/A;  . LYSIS OF ADHESION N/A 12/22/2016   Procedure: LYSIS OF ADHESION;  Surgeon: Harriette Bouillon, MD;  Location: [facility] OR;  Service: General;  Laterality: N/A;  . ORIF ANKLE FRACTURE  2004  . THYROIDECTOMY, PARTIAL    . TOTAL KNEE ARTHROPLASTY  04/21/2012   Procedure: TOTAL KNEE ARTHROPLASTY;  Surgeon: Loanne Drilling, MD;  Location: [facility] ORS;  Service: Orthopedics;  Laterality: Right;  . TOTAL KNEE ARTHROPLASTY Left 01/12/2013   Procedure: TOTAL KNEE ARTHROPLASTY;  Surgeon: Loanne Drilling, MD;  Location: [facility] ORS;  Service: Orthopedics;  Laterality: Left;  . UMBILICAL HERNIA REPAIR N/A 12/22/2016   Procedure: HERNIA REPAIR UMBILICAL ADULT;  Surgeon: Harriette Bouillon, MD;  Location: [facility] OR;  Service: General;  Laterality: N/A;  . US ECHOCARDIOGRAPHY  08/21/2004   EF 60-65%  . VESICOVAGINAL FISTULA CLOSURE W/ TAH       Home Medications:  Prior to Admission medications   Medication Sig Start Date End Date Taking? Authorizing Provider  acetaminophen (TYLENOL) 500 MG tablet Take 500 mg by mouth every 6 (six) hours as needed for pain.   Yes [provider]  Amino Acids-Protein Hydrolys (FEEDING SUPPLEMENT, PRO-STAT SUGAR FREE 64,) LIQD Take 30 mLs by mouth 2 (two) times daily.   Yes [provider]  aspirin EC 81 MG tablet Take 81 mg by mouth daily.   Yes [provider]    bisacodyl (DULCOLAX) 10 MG suppository Place 10 mg rectally every 3 (three) days.   Yes [provider]  butalbital-acetaminophen-caffeine (FIORICET, ESGIC) 50-325-40 MG per tablet Take 50 tablets by mouth every 4 (four) hours as needed for headache or migraine.  10/15/13  Yes [provider]  Calcium Carbonate (CALTRATE 600 PO) Take 1 tablet by mouth 2 (two) times daily.   Yes [provider]  Cholecalciferol (VITAMIN D PO) Take 4,000 tablets by mouth daily.    Yes [provider]  diclofenac sodium (VOLTAREN) 1 % GEL Apply 2 g topically 4 (four) times daily as needed (pain).   Yes [provider]  docusate sodium (COLACE) 100 MG capsule Take 200 mg by mouth daily.   Yes [provider]  DULoxetine (CYMBALTA) 30 MG capsule Take 30 mg by mouth daily.   Yes [provider]  DYMISTA 137-50 MCG/ACT SUSP Place 137 mcg into alternate nostrils 2 (two) times daily.  10/15/13  Yes [provider]  feeding supplement, ENSURE ENLIVE, (ENSURE ENLIVE) LIQD Take 237 mLs by mouth 2 (two) times daily between meals. 01/02/17  Yes Maxie Barb, MD  fenofibrate (TRICOR) 48 MG tablet Take 48 mg by mouth every evening.   Yes [provider]  losartan (COZAAR) 50 MG tablet Take 50 mg by mouth daily.   Yes [provider]  memantine (NAMENDA) 10 MG tablet Take 10 mg by mouth 2 (two) times daily.   Yes [provider]  miconazole (MICOTIN) 2 % cream Apply 1 application topically 2 (two) times daily. 08/03/17  Yes [provider]  multivitamin-iron-minerals-folic acid (THERAPEUTIC-M) TABS tablet Take 1 tablet by mouth every evening.   Yes [provider]  OLANZapine (ZYPREXA) 2.5 MG tablet Take 2.5 mg by mouth at bedtime.   Yes [provider]  polyethylene glycol (MIRALAX / GLYCOLAX) packet Take 17 g by mouth daily as needed for mild constipation. [name] taking differently: Take 17 g by  mouth daily.  12/21/16  Yes Maxie Barb, MD  pravastatin (PRAVACHOL) 80 MG tablet Take 80 mg by mouth every morning.   Yes [provider]  saccharomyces boulardii (FLORASTOR) 250 MG capsule Take 250 mg by mouth 2 (two) times daily.   Yes [provider]  solifenacin (VESICARE) 10 MG tablet Take 10 mg by mouth every evening.   Yes [provider]  vitamin B-12 (CYANOCOBALAMIN) 1000 MCG tablet Take 1,000 mcg by mouth every evening.   Yes [provider]  vitamin C (ASCORBIC ACID) 500 MG tablet Take 500 mg by mouth 2 (two) times daily.   Yes [provider]  zolpidem (AMBIEN) 5 MG tablet Take 2.5 mg by mouth at bedtime.    Yes [provider]  ALPRAZolam (XANAX) 0.25 MG tablet Take 1 tablet (0.25 mg total) by mouth at bedtime as needed for anxiety. [name] not taking: Reported on 08/09/2017 01/02/17   Maxie Barb, MD  feeding supplement (BOOST / RESOURCE BREEZE) LIQD Take 1 Container by mouth 3 (three) times daily between meals. [name] not taking: Reported on 08/09/2017 12/21/16   Maxie Barb, MD  Infant [employer] [facility] EX) Apply 1 application topically every 2 (two) hours as needed (after each changing).    [provider]  insulin aspart (NOVOLOG) 100 UNIT/ML injection Inject 0-9 Units into Adrienne skin 3 (three) times daily with meals. [name] not taking: Reported on 08/09/2017 01/02/17   Maxie Barb, MD  loperamide (IMODIUM) 2 MG capsule Take 1 capsule (2 mg total) by mouth as needed for diarrhea or loose stools. [name] not taking: Reported on 08/09/2017 12/21/16   Maxie Barb, MD  menthol-cetylpyridinium (CEPACOL) 3 MG lozenge Take 1 lozenge (3 mg total) by mouth as needed for sore throat. 01/02/17   Maxie Barb, MD  ondansetron (ZOFRAN) 8 MG tablet Take 1 tablet (8 mg total) by mouth every 8 (eight) hours as needed for nausea or vomiting. 12/21/16   Maxie Barb, MD   potassium chloride 20 MEQ/15ML (10%) SOLN Take 15 mLs (20 mEq total) by mouth daily. [name] not taking: Reported on 08/09/2017 12/22/16   Maxie Barb, MD    Inpatient Medications: Scheduled Meds: . aspirin EC  81 mg Oral Daily  . enoxaparin (LOVENOX) injection  40 mg Subcutaneous Q24H  . heparin  4,000 Units Intravenous Once  . insulin aspart  0-9 Units Subcutaneous TID WC  . insulin glargine  12 Units Subcutaneous Daily   Continuous Infusions: . sodium chloride    . heparin     PRN Meds: acetaminophen **OR** acetaminophen, levalbuterol, ondansetron **OR** ondansetron (ZOFRAN) IV  Allergies:    Allergies  Allergen Reactions  . Prednisone Shortness Of Breath  . Ibuprofen Other (See Comments)    Swelling mouth and lips  . Meclizine Other (See Comments)    HALLUCINATIONS  . Sulfonamide Derivatives     REACTION: Reaction not known    Social History:   Social History   Social History  . Marital status: Married    Spouse name: N/A  . Number of children: N/A  . Years of education: N/A   Occupational History  . clerical     retired   Social History Main Topics  . Smoking status: Never Smoker  . Smokeless tobacco: Never Used  . Alcohol use No  . Drug use: No  . Sexual activity: No   Other Topics Concern  . Not on file   Social History Narrative  . No narrative on file    Family History:    Family History  Problem Relation Age of Onset  . Heart disease Father   . Hypertension Father   . Heart attack Father   . Colon cancer Mother   . Heart attack Mother   . Hypertension Mother   . Breast cancer Sister      ROS:  Please see Adrienne history of present illness.  ROS  General:no colds or fevers, no weight changes Skin:no rashes or ulcers HEENT:no blurred vision, no congestion CV:see HPI PUL:see HPI GI:no diarrhea constipation or melena, no indigestion GU:no hematuria, no dysuria MS:+ Lt hip pain, no claudication Neuro:no syncope, no  lightheadedness Endo:+ diabetes, + thyroid disease    Physical Exam/Data:   Vitals:  08/09/17 0745 08/09/17 0815 08/09/17 0830 08/09/17 0851  BP: 113/67 (!) 114/54 105/65   Pulse: 91 82 83   Resp: (!) 27 (!) 22 (!) 22   Temp:      TempSrc:      SpO2: (!) 88% 93% 90%   Weight:    200 lb (90.7 kg)  Height:     (1.626 m)   No intake or output data in Adrienne 24 hours ending 08/09/17 1014 Filed Weights   08/09/17 0851  Weight: 200 lb (90.7 kg)   Body mass index is 34.33 kg/m.  General:  Well nourished, well developed, in no acute distress some mild hip pain currently HEENT: normal Lymph: no adenopathy Neck: no JVD Endocrine:  No thryomegaly Vascular: No carotid bruits; +1 pedal pulses bil   Cardiac:  normal S1, S2; RRR; no murmur gallup rub or click Lungs:  clear to auscultation bilaterally, no wheezing, rhonchi or rales but harsh breath sounds Abd: soft, nontender, no hepatomegaly  Ext: no edema Musculoskeletal:  No deformities, BUE and BLE strength normal and equal Skin: warm and dry  Neuro:  Alert, follows commands, MAE, + facial symmetry  Psych:  Normal affect    Relevant CV Studies: Echo 11/03/13  Study Conclusions  - Left ventricle: Wall thickness was increased in a pattern of moderate LVH. There was moderate concentric hypertrophy. Systolic function was normal. Adrienne estimated ejection fraction was in Adrienne range of 55% to 60%. Wall motion was normal; there were no regional wall motion abnormalities. Doppler parameters are consistent with abnormal left ventricular relaxation (grade 1 diastolic dysfunction). - Aortic valve: Valve area: 2.16cm^2 (Vmax). - Left atrium: Adrienne atrium was mildly dilated.  Laboratory Data:  Chemistry Recent Labs Lab 08/09/17 0422  NA 136  K 4.1  CL 103  CO2 19*  GLUCOSE 227*  BUN 18  CREATININE 1.20*  CALCIUM 9.5  GFRNONAA 42*  GFRAA 48*  ANIONGAP 14     Recent Labs Lab 08/09/17 0422  PROT 8.7*   ALBUMIN 3.2*  AST 56*  ALT 28  ALKPHOS 62  BILITOT 0.6   Hematology Recent Labs Lab 08/09/17 0422  WBC 14.1*  RBC 4.02  HGB 13.1  HCT 39.8  MCV 99.0  MCH 32.6  MCHC 32.9  RDW 16.1*  PLT 225   Cardiac Enzymes Recent Labs Lab 08/09/17 0422 08/09/17 0645  TROPONINI 0.46* 0.46*   No results for input(s): TROPIPOC in Adrienne last 168 hours.  BNPNo results for input(s): BNP, PROBNP in Adrienne last 168 hours.  DDimer No results for input(s): DDIMER in Adrienne last 168 hours.  Radiology/Studies:  Ct Abdomen Pelvis W Contrast  Result Date: 08/09/2017 CLINICAL DATA:  Upper abdominal and rectal pain. EXAM: CT ABDOMEN AND PELVIS WITH CONTRAST TECHNIQUE: Multidetector CT imaging of Adrienne abdomen and pelvis was performed using Adrienne standard protocol following bolus administration of intravenous contrast. CONTRAST:  80 mL of Isovue-300 intravenously. COMPARISON:  CT scan of December 22, 2016. FINDINGS: Lower chest: No acute abnormality. Hepatobiliary: Dilated gallbladder is noted with cholelithiasis, but no surrounding inflammation is noted. Stable calcification is seen in posterior segment of right hepatic lobe. Nodular hepatic contours are noted suggesting hepatic cirrhosis. Pancreas: Unremarkable. No pancreatic ductal dilatation or surrounding inflammatory changes. Spleen: Normal in size without focal abnormality. Adrenals/Urinary Tract: Adrenal glands are unremarkable. Kidneys are normal, without renal calculi, focal lesion, or hydronephrosis. Bladder is unremarkable. Stomach/Bowel: Adrienne stomach appears normal. There is no evidence of bowel obstruction or inflammation. Diverticulosis of  transverse and descending colon is noted. Adrienne appendix is not visualized. Vascular/Lymphatic: Aortic atherosclerosis. No enlarged abdominal or pelvic lymph nodes. Reproductive: Status post hysterectomy. No adnexal masses. Other: No abdominal wall hernia or abnormality. No abdominopelvic ascites. Musculoskeletal: No acute or  significant osseous findings. IMPRESSION: Cholelithiasis without inflammation. Nodular hepatic contours are noted suggesting hepatic cirrhosis. Diverticulosis of transverse and descending colon is noted without inflammation. Aortic atherosclerosis. Electronically Signed   By: Lupita Raider, M.D.   On: 08/09/2017 07:53   Dg Chest Port 1 View  Result Date: 08/09/2017 CLINICAL DATA:  Shortness of breath this morning. Upper abdominal pain. EXAM: PORTABLE CHEST 1 VIEW COMPARISON:  Radiograph 12/23/2016 FINDINGS: Mild cardiomegaly, similar. Minimal central vascular congestion without pulmonary edema. No consolidation, pleural fluid or pneumothorax. No acute osseous abnormality. IMPRESSION: Stable mild cardiomegaly. Central pulmonary vascular prominence without overt CHF. Electronically Signed   By: Rubye Oaks M.D.   On: 08/09/2017 05:07    Assessment and Plan:   1. Elevated troponins with hypotension --serial tropoins, currently flat -has been started on ASA, and heparin 2. Abnormal EKG with lateral ischemia but with hypotension previous stress test in 2012 neg.  Adrienne [name] does have risk factors for CAD with DM, HTN and family hx.  would check echo and further work up if needed.   3. abd and rectal pain per IM  Also some Lt hip pain 4.  dementia on namenda and zyprexa    For questions or updates, please contact [facility] Please consult [email or web address] for contact info under Cardiology/STEMI.   Signed, Nada Boozer, NP  08/09/2017 10:14 AM   History and all data above reviewed.  [name] examined.  I agree with Adrienne findings as above.  Adrienne [name] is very confused and so there is no reliable history.  Adrienne [name] came to Adrienne ED because of hypotension.  Adrienne [name] is noted to have an elevated but flat troponin trend.  Adrienne [name] says that Adrienne [name] lives at home with her husband and mother.  (Adrienne [name] lives in a nursing home.)  Adrienne [name] does report some chest pain across her chest but is unable to quantify or qualify this.  EKG with non  specific ST T wave changes but with prolonged QT.  No associated symptoms.  Adrienne [name] exam reveals COR:RRR  ,  Lungs: Clear  ,  Abd: Positive bowel sounds, no rebound no guarding, Ext Left leg chronic edema.    .  All available labs, radiology testing, previous records reviewed. Agree with documented assessment and plan.  Elevated troponin:  This is non specific in this setting.  EKG does not suggest an acute coronary process and symptoms are not reliable.   I agree that we should treat with heparin and ASA and continue to cycle enzymes and check an echo.  However, if there is no clear evidence of ischemic heart disease I will likely suggest conservative therapy. I will need to discuss this with her daughter.     Prolonged QTc:  Likely related to meds.  No evidence of arrhythmia.  Follow telemetry and avoid further QT prolonging meds.   Fayrene Fearing Sherlie Boyum  1:55 PM  08/09/2017

## 2017-08-09 NOTE — ED Notes (Signed)
Bed: WA18 Expected date:  Expected time:  Means of arrival:  Comments: EMS 

## 2017-08-09 NOTE — ED Provider Notes (Signed)
[facility]-EMERGENCY DEPT Provider Note   CSN: [id number] Arrival date & time: 08/09/17  4098     History   Chief Complaint Chief Complaint  Patient presents with  . Hypotension  . Rectal Pain    HPI Adrienne [name] is a 81 y.o. female.  Constipation x 3 weeks without nausea or vomiting. Weak, SOB, lightheaded. She is ambulatory at baseline - question ability to walk now. No fever. She c/o SOB, with nursing home reports of low blood pressure tonight. She reports a decreased appetite, abdominal pain, constipation and rectal pain as well. She has a history of dementia and is considered an unreliable historian.       The history is provided by the patient and a relative. No language interpreter was used.    Past Medical History:  Diagnosis Date  . Anxiety   . Depression   . Diabetes mellitus    ORAL MED  . Frequency of urination   . Headache(784.0) 10/23/2013  . Hypercholesterolemia   . Hypertension   . Hypothyroidism    STATES SHE NO LONGER NEEDS THYROID SUPPLEMENT  . Nocturia   . Normal nuclear stress test Jan 2012   No ischemia. EF 81%  . Obesity   . Osteoarthritis (arthritis due to wear and tear of joints)    PAIN AND OA BOTH KNEES  . Osteopenia   . Shortness of breath    WITH EXERTION  . Sleep apnea    DOES NOT USE CPAP-UNABLE TO TOLERATE  . Urinary frequency     Patient Active Problem List   Diagnosis Date Noted  . Goals of care, counseling/discussion   . Palliative care encounter   . Postoperative ileus (HCC) 12/24/2016  . Strangulation of intestine (HCC) 12/22/2016  . Leukocytosis   . Aspiration pneumonia of left lung (HCC)   . DNR (do not resuscitate) discussion 12/19/2016  . Palliative care by specialist 12/19/2016  . Abdominal distention   . Oral phase dysphagia   . Pressure injury of skin 12/11/2016  . Ventilator dependent (HCC)   . SBO (small bowel obstruction) (HCC) 12/08/2016  . Acute on chronic respiratory failure with hypoxemia (HCC)   .  Acute renal failure (HCC)   . Elevated lactic acid level   . Metabolic acidosis   . Ileus (HCC)   . Pneumonia 11/02/2013  . Syncope 11/02/2013  . Falls frequently 11/02/2013  . Anxiety state, unspecified 11/02/2013  . Depression 11/02/2013  . Unspecified hypothyroidism 11/02/2013  . Essential hypertension 11/02/2013  . HLD (hyperlipidemia) 11/02/2013  . Diabetes (HCC) 11/02/2013  . Dementia with behavioral disturbance 11/02/2013  . Chronic pain syndrome 11/02/2013  . Headache(784.0) 10/23/2013  . Postop Hypokalemia 01/15/2013  . Postoperative anemia due to acute blood loss 01/15/2013  . Postop Transfusion 01/15/2013  . OA (osteoarthritis) of knee 04/21/2012  . Pre-operative clearance 02/08/2012  . OSA (obstructive sleep apnea) 05/03/2011    Past Surgical History:  Procedure Laterality Date  . ABDOMINAL HYSTERECTOMY    . BILATERAL OOPHORECTOMY    . CARDIAC CATHETERIZATION  02/03/1999   EF 70%  . CARDIOVASCULAR STRESS TEST  11/30/2010   EF 81%, NORMAL  . LAPAROTOMY N/A 12/22/2016   Procedure: EXPLORATORY LAPAROTOMY;  Surgeon: Harriette Bouillon, MD;  Location: [facility] OR;  Service: General;  Laterality: N/A;  . LYSIS OF ADHESION N/A 12/22/2016   Procedure: LYSIS OF ADHESION;  Surgeon: Harriette Bouillon, MD;  Location: [facility] OR;  Service: General;  Laterality: N/A;  . ORIF ANKLE FRACTURE  2004  .  THYROIDECTOMY, PARTIAL    . TOTAL KNEE ARTHROPLASTY  04/21/2012   Procedure: TOTAL KNEE ARTHROPLASTY;  Surgeon: Loanne Drilling, MD;  Location: [facility] ORS;  Service: Orthopedics;  Laterality: Right;  . TOTAL KNEE ARTHROPLASTY Left 01/12/2013   Procedure: TOTAL KNEE ARTHROPLASTY;  Surgeon: Loanne Drilling, MD;  Location: [facility] ORS;  Service: Orthopedics;  Laterality: Left;  . UMBILICAL HERNIA REPAIR N/A 12/22/2016   Procedure: HERNIA REPAIR UMBILICAL ADULT;  Surgeon: Harriette Bouillon, MD;  Location: [facility] OR;  Service: General;  Laterality: N/A;  . US ECHOCARDIOGRAPHY  08/21/2004   EF 60-65%  . VESICOVAGINAL FISTULA  CLOSURE W/ [facility]      OB History    No data available       Home Medications    Prior to Admission medications   Medication Sig Start Date End Date Taking? Authorizing Provider  acetaminophen (TYLENOL) 500 MG tablet Take 500 mg by mouth every 6 (six) hours as needed for pain.    [provider]  ALPRAZolam Prudy Feeler) 0.25 MG tablet Take 1 tablet (0.25 mg total) by mouth at bedtime as needed for anxiety. 01/02/17   Maxie Barb, MD  aspirin EC 81 MG tablet Take 81 mg by mouth daily.    [provider]  butalbital-acetaminophen-caffeine (FIORICET, ESGIC) 50-325-40 MG per tablet Take 50 tablets by mouth every 4 (four) hours as needed for headache or migraine.  10/15/13   [provider]  Calcium Carbonate (CALTRATE 600 PO) Take 1 tablet by mouth 2 (two) times daily.    [provider]  Cholecalciferol (VITAMIN D PO) Take 4,000 tablets by mouth daily.     [provider]  diclofenac sodium (VOLTAREN) 1 % GEL Apply 2 g topically 4 (four) times daily as needed (pain).    [provider]  DULoxetine (CYMBALTA) 60 MG capsule Take 60 mg by mouth daily.    [provider]  DYMISTA 137-50 MCG/ACT SUSP Place 137 mcg into alternate nostrils 2 (two) times daily.  10/15/13   [provider]  feeding supplement (BOOST / RESOURCE BREEZE) LIQD Take 1 Container by mouth 3 (three) times daily between meals. 12/21/16   Maxie Barb, MD  feeding supplement, ENSURE ENLIVE, (ENSURE ENLIVE) LIQD Take 237 mLs by mouth 2 (two) times daily between meals. 01/02/17   Maxie Barb, MD  fenofibrate (TRICOR) 48 MG tablet Take 48 mg by mouth every evening.    [provider]  Infant Care Products [facility] EX) Apply 1 application topically every 2 (two) hours as needed (after each changing).    [provider]  insulin aspart (NOVOLOG) 100 UNIT/ML injection Inject 0-9 Units into the skin 3 (three) times daily with  meals. 01/02/17   Maxie Barb, MD  loperamide (IMODIUM) 2 MG capsule Take 1 capsule (2 mg total) by mouth as needed for diarrhea or loose stools. 12/21/16   Maxie Barb, MD  memantine (NAMENDA) 10 MG tablet Take 10 mg by mouth 2 (two) times daily.    [provider]  menthol-cetylpyridinium (CEPACOL) 3 MG lozenge Take 1 lozenge (3 mg total) by mouth as needed for sore throat. 01/02/17   Maxie Barb, MD  Multiple Vitamins-Minerals (DECUBI-VITE) CAPS Take 1 capsule by mouth every evening.    [provider]  multivitamin-iron-minerals-folic acid (THERAPEUTIC-M) TABS tablet Take 1 tablet by mouth every evening.    [provider]  OLANZapine (ZYPREXA) 2.5 MG tablet Take 2.5 mg by mouth at bedtime.  [provider]  ondansetron (ZOFRAN) 8 MG tablet Take 1 tablet (8 mg total) by mouth every 8 (eight) hours as needed for nausea or vomiting. 12/21/16   Maxie Barb, MD  polyethylene glycol [facility] / Ethelene Hal) packet Take 17 g by mouth daily as needed for mild constipation. 12/21/16   Maxie Barb, MD  potassium chloride 20 MEQ/15ML (10%) SOLN Take 15 mLs (20 mEq total) by mouth daily. 12/22/16   Maxie Barb, MD  pravastatin (PRAVACHOL) 80 MG tablet Take 80 mg by mouth every morning.    [provider]  saccharomyces boulardii (FLORASTOR) 250 MG capsule Take 250 mg by mouth 2 (two) times daily.    [provider]  solifenacin (VESICARE) 10 MG tablet Take 10 mg by mouth every evening.    [provider]  triamcinolone ointment (KENALOG) 0.1 % Apply 1 application topically 2 (two) times daily as needed (infection in toe).    [provider]  vitamin B-12 (CYANOCOBALAMIN) 1000 MCG tablet Take 1,000 mcg by mouth every evening.    [provider]  vitamin C (ASCORBIC ACID) 500 MG tablet Take 500 mg by mouth 2 (two) times daily.    [provider]  zolpidem (AMBIEN) 5 MG  tablet Take 5 mg by mouth at bedtime.    [provider]    Family History Family History  Problem Relation Age of Onset  . Heart disease Father   . Hypertension Father   . Heart attack Father   . Colon cancer Mother   . Heart attack Mother   . Hypertension Mother   . Breast cancer Sister     Social History Social History  Substance Use Topics  . Smoking status: Never Smoker  . Smokeless tobacco: Never Used  . Alcohol use No     Allergies   Prednisone; Ibuprofen; Meclizine; and Sulfonamide derivatives   Review of Systems Review of Systems  Constitutional: Positive for appetite change. Negative for chills and fever.  HENT: Negative.   Respiratory: Positive for cough and shortness of breath.   Cardiovascular: Positive for chest pain.  Gastrointestinal: Positive for abdominal pain, constipation and rectal pain. Negative for blood in stool, nausea and vomiting.  Genitourinary: Negative.   Musculoskeletal: Negative.   Skin: Positive for wound (Decubitus).  Neurological: Positive for weakness.     Physical Exam Updated Vital Signs BP (!) 82/48 (BP Location: Right Arm)   Pulse 89   Temp 98.6 F (37 C) (Rectal)   Resp (!) 26   SpO2 93%   Physical Exam  Constitutional: She is oriented to person, place, and time. She appears well-developed and well-nourished.  HENT:  Head: Normocephalic and atraumatic.  Mouth/Throat: Mucous membranes are dry.  Neck: Normal range of motion. Neck supple.  Cardiovascular: Normal rate and regular rhythm.   No murmur heard. Pulmonary/Chest: Effort normal and breath sounds normal. She has no wheezes. She has no rales.  Abdominal: Soft. Bowel sounds are normal. There is tenderness (Diffuse). There is no rebound and no guarding.  Genitourinary:  Genitourinary Comments: No fecal impaction. No external hemorrhoids, evidence of bleeding. No stool in rectal vault.   Musculoskeletal: Normal range of motion. She exhibits no edema.    Neurological: She is alert and oriented to person, place, and time.  Skin: Skin is warm and dry. No rash noted.  Psychiatric: She has a normal mood and affect.     ED Treatments / Results  Labs (all labs ordered are listed,  but only abnormal results are displayed) Labs Reviewed - No data to display  EKG  EKG Interpretation None       Radiology No results found.  Procedures Procedures (including critical care time)  Medications Ordered in ED Medications - No data to display   Initial Impression / Assessment and Plan / ED Course  I have reviewed the triage vital signs and the nursing notes.  Pertinent labs & imaging results that were available during my care of the patient were reviewed by me and considered in my medical decision making (see chart for details).     Patient is ill appearing, and very dry. Blood pressure on arrival is 82/48. No tachycardia. O2 saturations decrease on the monitor to 89% - 2L O2 via [place] started with improvement to 94%. The patient complains of abdominal and rectal pain.  IVF's started as bolus. Blood pressure improves to 104/69. Fentanyl provided for pain. Labs and imaging pending.  4:00 blood pressure is stable without further hypotension.   7:30 - daughter is at bedside. History from daughter relates patient is significantly demented and is at baseline now. Daughter reports she complains of abdominal and chest pain "from time to time". She has no other information from the nursing home. The patient continues to complain of pain in generalized abdomen and is significantly tender diffusely. CT pending.  Delta troponin elevated similar to initial value. EKG per Dr. Delford Field interpretation. Abnormalities felt to be related to demand ischemia secondary to hypotension.   CT results are negative for any source of abdominal pain. VSS. O2 saturation 96% on 2 L via [place]. Patient care signed out to Marily Memos, MD, pending UA results and admission.    Final Clinical Impressions(s) / ED Diagnoses   Final diagnoses:  None   1. Dyspnea 2. Hypoxia 3. Abdominal pain 4. Dementia  New Prescriptions New Prescriptions   No medications on file     Elpidio Anis, Cordelia Poche 08/10/17 [id number]    Elpidio Anis, PA-C 08/10/17 [id number]    Devoria Albe, MD 08/13/17 [phone]

## 2017-08-09 NOTE — H&P (Addendum)
[facility] History and Physical  Adrienne [name]:[id number] DOB: Feb 04, 1936 DOA: 08/09/2017  Referring physician:  PCP: Merri Brunette, MD   Chief Complaint: Low blood pressure  HPI:   81 year old female with a history of anxiety, depression, diabetes, hypertension, sleep apnea, morbid obesity, resident of [name], who presented to the ED because of low blood pressure. Patient has underlying dementia and is a very poor historian and patient's daughter who is by the bedside is unable to provide any meaningful history. According to the chart patient was brought in  early this morning due to low blood pressure, complains of upper abdominal pain, rectal pain. Patient's initial blood pressure was 86/ 36. CBG was 260. Reportedly has also had decreased oral intake, patient appears to be dehydrated clinically on exam.  After receiving about 2 L of fluid patient's blood pressure has improved. Workup reveals glucose of 227, bicarbonate of 19, troponin 0.46, white blood cell count 14.1, UA negative, chest x-ray shows mild cardiomegaly with central bony vascular prominence without overt CHF, CT abdomen and pelvis shows cholelithiasis without inflammation Patient admitted for possible ACS, low blood pressure, cardiology has been consulted     Review of Systems: negative for the following   Unable to perform review of systems due to underlying dementia     Past Medical History:  Diagnosis Date  . Anxiety   . Depression   . Diabetes mellitus    ORAL MED  . Frequency of urination   . Headache(784.0) 10/23/2013  . Hypercholesterolemia   . Hypertension   . Hypothyroidism    STATES SHE NO LONGER NEEDS THYROID SUPPLEMENT  . Nocturia   . Normal nuclear stress test Jan 2012   No ischemia. EF 81%  . Obesity   . Osteoarthritis (arthritis due to wear and tear of joints)    PAIN AND OA BOTH KNEES  . Osteopenia   . Shortness of breath    WITH EXERTION  . Sleep apnea    DOES NOT USE  CPAP-UNABLE TO TOLERATE  . Urinary frequency      Past Surgical History:  Procedure Laterality Date  . ABDOMINAL HYSTERECTOMY    . BILATERAL OOPHORECTOMY    . CARDIAC CATHETERIZATION  02/03/1999   EF 70%  . CARDIOVASCULAR STRESS TEST  11/30/2010   EF 81%, NORMAL  . LAPAROTOMY N/A 12/22/2016   Procedure: EXPLORATORY LAPAROTOMY;  Surgeon: Harriette Bouillon, MD;  Location: [facility];  Service: General;  Laterality: N/A;  . LYSIS OF ADHESION N/A 12/22/2016   Procedure: LYSIS OF ADHESION;  Surgeon: Harriette Bouillon, MD;  Location: [facility] OR;  Service: General;  Laterality: N/A;  . ORIF ANKLE FRACTURE  2004  . THYROIDECTOMY, PARTIAL    . TOTAL KNEE ARTHROPLASTY  04/21/2012   Procedure: TOTAL KNEE ARTHROPLASTY;  Surgeon: Loanne Drilling, MD;  Location: [facility] ORS;  Service: Orthopedics;  Laterality: Right;  . TOTAL KNEE ARTHROPLASTY Left 01/12/2013   Procedure: TOTAL KNEE ARTHROPLASTY;  Surgeon: Loanne Drilling, MD;  Location: [facility] ORS;  Service: Orthopedics;  Laterality: Left;  . UMBILICAL HERNIA REPAIR N/A 12/22/2016   Procedure: HERNIA REPAIR UMBILICAL ADULT;  Surgeon: Harriette Bouillon, MD;  Location: [facility] OR;  Service: General;  Laterality: N/A;  . US ECHOCARDIOGRAPHY  08/21/2004   EF 60-65%  . VESICOVAGINAL FISTULA CLOSURE W/ TAH        Social History:  reports that she has never smoked. She has never used smokeless tobacco. She reports that she does not drink alcohol or use drugs.  Allergies  Allergen Reactions  . Prednisone Shortness Of Breath  . Ibuprofen Other (See Comments)    Swelling mouth and lips  . Meclizine Other (See Comments)    HALLUCINATIONS  . Sulfonamide Derivatives     REACTION: Reaction not known    Family History  Problem Relation Age of Onset  . Heart disease Father   . Hypertension Father   . Heart attack Father   . Colon cancer Mother   . Heart attack Mother   . Hypertension Mother   . Breast cancer Sister          Prior to Admission medications   Medication Sig Start  Date End Date Taking? Authorizing Provider  acetaminophen (TYLENOL) 500 MG tablet Take 500 mg by mouth every 6 (six) hours as needed for pain.   Yes [provider]  Amino Acids-Protein Hydrolys (FEEDING SUPPLEMENT, PRO-STAT SUGAR FREE 64,) LIQD Take 30 mLs by mouth 2 (two) times daily.   Yes [provider]  aspirin EC 81 MG tablet Take 81 mg by mouth daily.   Yes [provider]  bisacodyl (DULCOLAX) 10 MG suppository Place 10 mg rectally every 3 (three) days.   Yes [provider]  butalbital-acetaminophen-caffeine (FIORICET, ESGIC) 50-325-40 MG per tablet Take 50 tablets by mouth every 4 (four) hours as needed for headache or migraine.  10/15/13  Yes [provider]  Calcium Carbonate (CALTRATE 600 PO) Take 1 tablet by mouth 2 (two) times daily.   Yes [provider]  Cholecalciferol (VITAMIN D PO) Take 4,000 tablets by mouth daily.    Yes [provider]  diclofenac sodium (VOLTAREN) 1 % GEL Apply 2 g topically 4 (four) times daily as needed (pain).   Yes [provider]  docusate sodium (COLACE) 100 MG capsule Take 200 mg by mouth daily.   Yes [provider]  DULoxetine (CYMBALTA) 30 MG capsule Take 30 mg by mouth daily.   Yes [provider]  DYMISTA 137-50 MCG/ACT SUSP Place 137 mcg into alternate nostrils 2 (two) times daily.  10/15/13  Yes [provider]  feeding supplement, ENSURE ENLIVE, (ENSURE ENLIVE) LIQD Take 237 mLs by mouth 2 (two) times daily between meals. 01/02/17  Yes Maxie Barb, MD  fenofibrate (TRICOR) 48 MG tablet Take 48 mg by mouth every evening.   Yes [provider]  losartan (COZAAR) 50 MG tablet Take 50 mg by mouth daily.   Yes [provider]  memantine (NAMENDA) 10 MG tablet Take 10 mg by mouth 2 (two) times daily.   Yes [provider]  miconazole (MICOTIN) 2 % cream Apply 1 application topically 2 (two) times daily. 08/03/17  Yes  [provider]  multivitamin-iron-minerals-folic acid (THERAPEUTIC-M) TABS tablet Take 1 tablet by mouth every evening.   Yes [provider]  OLANZapine (ZYPREXA) 2.5 MG tablet Take 2.5 mg by mouth at bedtime.   Yes [provider]  polyethylene glycol (MIRALAX / GLYCOLAX) packet Take 17 g by mouth daily as needed for mild constipation. Patient taking differently: Take 17 g by mouth daily.  12/21/16  Yes Maxie Barb, MD  pravastatin (PRAVACHOL) 80 MG tablet Take 80 mg by mouth every morning.   Yes [provider]  saccharomyces boulardii (FLORASTOR) 250 MG capsule Take 250 mg by mouth 2 (two) times daily.   Yes [provider]  solifenacin (VESICARE) 10 MG tablet Take 10 mg by mouth every evening.   Yes [provider]  vitamin B-12 (CYANOCOBALAMIN) 1000 MCG tablet Take 1,000 mcg by mouth every evening.   Yes [provider]  vitamin C (ASCORBIC ACID) 500 MG tablet Take 500 mg by mouth 2 (two) times daily.   Yes [provider]  zolpidem (AMBIEN) 5 MG tablet Take 2.5 mg by mouth at bedtime.    Yes [provider]  ALPRAZolam (XANAX) 0.25 MG tablet Take 1 tablet (0.25 mg total) by mouth at bedtime as needed for anxiety. Patient not taking: Reported on 08/09/2017 01/02/17   Maxie Barb, MD  feeding supplement (BOOST / RESOURCE BREEZE) LIQD Take 1 Container by mouth 3 (three) times daily between meals. Patient not taking: Reported on 08/09/2017 12/21/16   Maxie Barb, MD  Inf[employer] [facility] EX) Apply 1 application topically every 2 (two) hours as needed (after each changing).    [provider]  insulin aspart (NOVOLOG) 100 UNIT/ML injection Inject 0-9 Units into the skin 3 (three) times daily with meals. Patient not taking: Reported on 08/09/2017 01/02/17   Maxie Barb, MD  loperamide (IMODIUM) 2 MG capsule Take 1 capsule (2 mg total) by mouth as needed for diarrhea  or loose stools. Patient not taking: Reported on 08/09/2017 12/21/16   Maxie Barb, MD  menthol-cetylpyridinium (CEPACOL) 3 MG lozenge Take 1 lozenge (3 mg total) by mouth as needed for sore throat. 01/02/17   Maxie Barb, MD  ondansetron (ZOFRAN) 8 MG tablet Take 1 tablet (8 mg total) by mouth every 8 (eight) hours as needed for nausea or vomiting. 12/21/16   Maxie Barb, MD  potassium chloride 20 MEQ/15ML (10%) SOLN Take 15 mLs (20 mEq total) by mouth daily. Patient not taking: Reported on 08/09/2017 12/22/16   Maxie Barb, MD     Physical Exam: Vitals:   08/09/17 0745 08/09/17 0815 08/09/17 0830 08/09/17 0851  BP: 113/67 (!) 114/54 105/65   Pulse: 91 82 83   Resp: (!) 27 (!) 22 (!) 22   Temp:      TempSrc:      SpO2: (!) 88% 93% 90%   Weight:    90.7 kg (200 lb)  Height:     (1.626 m)        Vitals:   08/09/17 0745 08/09/17 0815 08/09/17 0830 08/09/17 0851  BP: 113/67 (!) 114/54 105/65   Pulse: 91 82 83   Resp: (!) 27 (!) 22 (!) 22   Temp:      TempSrc:      SpO2: (!) 88% 93% 90%   Weight:    90.7 kg (200 lb)  Height:     (1.626 m)   Constitutional: NAD , Confused Eyes: PERRL, lids and conjunctivae normal ENMT: Mucous membranes are moist. Posterior pharynx clear of any exudate or lesions.Normal dentition.  Neck: normal, supple, no masses, no thyromegaly Respiratory: clear to auscultation bilaterally, no wheezing, no crackles. Normal respiratory effort. No accessory muscle use.  Cardiovascular: Regular rate and rhythm, no murmurs / rubs / gallops. No extremity edema. 2+ pedal pulses. No carotid bruits.  Abdomen: no tenderness, no masses palpated. No hepatosplenomegaly. Bowel sounds positive.  Musculoskeletal: no clubbing / cyanosis. No joint deformity upper and lower extremities. Good ROM, no contractures. Normal muscle tone.  Skin: no rashes, lesions, ulcers. No induration Neurologic: CN 2-12 grossly intact. Sensation intact,  DTR normal. Strength 5/5 in all 4.  Psychiatric confused    Labs on Admission: I have personally reviewed following labs and  imaging studies  CBC:  Recent Labs Lab 08/09/17 0422  WBC 14.1*  NEUTROABS 11.1*  HGB 13.1  HCT 39.8  MCV 99.0  PLT 225    Basic Metabolic Panel:  Recent Labs Lab 08/09/17 0422  NA 136  K 4.1  CL 103  CO2 19*  GLUCOSE 227*  BUN 18  CREATININE 1.20*  CALCIUM 9.5    GFR: Estimated Creatinine Clearance: 41.5 mL/min (A) (by C-G formula based on SCr of 1.2 mg/dL (H)).  Liver Function Tests:  Recent Labs Lab 08/09/17 0422  AST 56*  ALT 28  ALKPHOS 62  BILITOT 0.6  PROT 8.7*  ALBUMIN 3.2*   No results for input(s): LIPASE, AMYLASE in the last 168 hours. No results for input(s): AMMONIA in the last 168 hours.  Coagulation Profile: No results for input(s): INR, PROTIME in the last 168 hours. No results for input(s): DDIMER in the last 72 hours.  Cardiac Enzymes:  Recent Labs Lab 08/09/17 0422 08/09/17 0645  TROPONINI 0.46* 0.46*    BNP (last 3 results) No results for input(s): PROBNP in the last 8760 hours.  HbA1C: No results for input(s): HGBA1C in the last 72 hours. Lab Results  Component Value Date   HGBA1C 6.1 (H) 11/02/2013     CBG: No results for input(s): GLUCAP in the last 168 hours.  Lipid Profile: No results for input(s): CHOL, HDL, LDLCALC, TRIG, CHOLHDL, LDLDIRECT in the last 72 hours.  Thyroid Function Tests: No results for input(s): TSH, T4TOTAL, FREET4, T3FREE, THYROIDAB in the last 72 hours.  Anemia Panel: No results for input(s): VITAMINB12, FOLATE, FERRITIN, TIBC, IRON, RETICCTPCT in the last 72 hours.  Urine analysis:    Component Value Date/Time   COLORURINE YELLOW 08/09/2017 0754   APPEARANCEUR CLOUDY (A) 08/09/2017 0754   LABSPEC 1.030 08/09/2017 0754   PHURINE 5.0 08/09/2017 0754   GLUCOSEU NEGATIVE 08/09/2017 0754   HGBUR NEGATIVE 08/09/2017 0754   BILIRUBINUR NEGATIVE 08/09/2017  0754   KETONESUR NEGATIVE 08/09/2017 0754   PROTEINUR 30 (A) 08/09/2017 0754   UROBILINOGEN 0.2 11/02/2013 1645   NITRITE NEGATIVE 08/09/2017 0754   LEUKOCYTESUR NEGATIVE 08/09/2017 0754    Sepsis Labs: (procalcitonin:4,lacticidven:4) )No results found for this or any previous visit (from the past 240 hour(s)).       Radiological Exams on Admission: Ct Abdomen Pelvis W Contrast  Result Date: 08/09/2017 CLINICAL DATA:  Upper abdominal and rectal pain. EXAM: CT ABDOMEN AND PELVIS WITH CONTRAST TECHNIQUE: Multidetector CT imaging of the abdomen and pelvis was performed using the standard protocol following bolus administration of intravenous contrast. CONTRAST:  80 mL of Isovue-300 intravenously. COMPARISON:  CT scan of December 22, 2016. FINDINGS: Lower chest: No acute abnormality. Hepatobiliary: Dilated gallbladder is noted with cholelithiasis, but no surrounding inflammation is noted. Stable calcification is seen in posterior segment of right hepatic lobe. Nodular hepatic contours are noted suggesting hepatic cirrhosis. Pancreas: Unremarkable. No pancreatic ductal dilatation or surrounding inflammatory changes. Spleen: Normal in size without focal abnormality. Adrenals/Urinary Tract: Adrenal glands are unremarkable. Kidneys are normal, without renal calculi, focal lesion, or hydronephrosis. Bladder is unremarkable. Stomach/Bowel: The stomach appears normal. There is no evidence of bowel obstruction or inflammation. Diverticulosis of transverse and descending colon is noted. The appendix is not visualized. Vascular/Lymphatic: Aortic atherosclerosis. No enlarged abdominal or pelvic lymph nodes. Reproductive: Status post hysterectomy. No adnexal masses. Other: No abdominal wall hernia or abnormality. No abdominopelvic ascites. Musculoskeletal: No acute or significant osseous findings. IMPRESSION: Cholelithiasis without inflammation. Nodular hepatic contours  are noted suggesting hepatic  cirrhosis. Diverticulosis of transverse and descending colon is noted without inflammation. Aortic atherosclerosis. Electronically Signed   By: Lupita Raider, M.D.   On: 08/09/2017 07:53   Dg Chest Port 1 View  Result Date: 08/09/2017 CLINICAL DATA:  Shortness of breath this morning. Upper abdominal pain. EXAM: PORTABLE CHEST 1 VIEW COMPARISON:  Radiograph 12/23/2016 FINDINGS: Mild cardiomegaly, similar. Minimal central vascular congestion without pulmonary edema. No consolidation, pleural fluid or pneumothorax. No acute osseous abnormality. IMPRESSION: Stable mild cardiomegaly. Central pulmonary vascular prominence without overt CHF. Electronically Signed   By: Rubye Oaks M.D.   On: 08/09/2017 05:07   Ct Abdomen Pelvis W Contrast  Result Date: 08/09/2017 CLINICAL DATA:  Upper abdominal and rectal pain. EXAM: CT ABDOMEN AND PELVIS WITH CONTRAST TECHNIQUE: Multidetector CT imaging of the abdomen and pelvis was performed using the standard protocol following bolus administration of intravenous contrast. CONTRAST:  80 mL of Isovue-300 intravenously. COMPARISON:  CT scan of December 22, 2016. FINDINGS: Lower chest: No acute abnormality. Hepatobiliary: Dilated gallbladder is noted with cholelithiasis, but no surrounding inflammation is noted. Stable calcification is seen in posterior segment of right hepatic lobe. Nodular hepatic contours are noted suggesting hepatic cirrhosis. Pancreas: Unremarkable. No pancreatic ductal dilatation or surrounding inflammatory changes. Spleen: Normal in size without focal abnormality. Adrenals/Urinary Tract: Adrenal glands are unremarkable. Kidneys are normal, without renal calculi, focal lesion, or hydronephrosis. Bladder is unremarkable. Stomach/Bowel: The stomach appears normal. There is no evidence of bowel obstruction or inflammation. Diverticulosis of transverse and descending colon is noted. The appendix is not visualized. Vascular/Lymphatic: Aortic atherosclerosis.  No enlarged abdominal or pelvic lymph nodes. Reproductive: Status post hysterectomy. No adnexal masses. Other: No abdominal wall hernia or abnormality. No abdominopelvic ascites. Musculoskeletal: No acute or significant osseous findings. IMPRESSION: Cholelithiasis without inflammation. Nodular hepatic contours are noted suggesting hepatic cirrhosis. Diverticulosis of transverse and descending colon is noted without inflammation. Aortic atherosclerosis. Electronically Signed   By: Lupita Raider, M.D.   On: 08/09/2017 07:53   Dg Chest Port 1 View  Result Date: 08/09/2017 CLINICAL DATA:  Shortness of breath this morning. Upper abdominal pain. EXAM: PORTABLE CHEST 1 VIEW COMPARISON:  Radiograph 12/23/2016 FINDINGS: Mild cardiomegaly, similar. Minimal central vascular congestion without pulmonary edema. No consolidation, pleural fluid or pneumothorax. No acute osseous abnormality. IMPRESSION: Stable mild cardiomegaly. Central pulmonary vascular prominence without overt CHF. Electronically Signed   By: Rubye Oaks M.D.   On: 08/09/2017 05:07      EKG: Independently reviewed.  :  Sinus rhythm Probable left atrial enlargement Incomplete right bundle branch block Low voltage, precordial leads Nonspecific T abnormalities  Assessment/Plan Principal Problem:   ACS (acute coronary syndrome) (HCC)/non-ST elevation MI Poor historian, unable to locate , described her chest pain EKG nonspecific, but without any acute ST-T segment changes Troponin flat Cardiology consulted for possible ACS Started on a heparin drip, aspirin, Echo to rule out wall motion abnormalities Cardiology to make further recommendations   Hypotension, ruled out for underlying infection, UA, chest x-ray negative Unclear etiology No signs of underlying infection Possibly dehydrated secondary to elevated CBG Continue IV fluids  Diabetes mellitus-check hemoglobin A1c, bicarbonate 19, anion gap 14, urine negative for ketones, doubt  DKA Add low-dose Lantus to SSI  Abdominal pain-CT abdomen pelvis shows cirrhosis of the liver, otherwise no acute pathology   Mild dementia continue Namenda, Zyprexa    Depression-continue Xanax and Cymbalta    Essential hypertension-blood pressure soft hold antihypertensive medications  DVT prophylaxis:  Heparin drip   Code Status History  Full code      consults called: Cardiology  Family Communication: Admission, patients condition and plan of care including tests being ordered have been discussed with the patient  who indicates understanding and agree with the plan and Code Status  Admission status: inpatient    Disposition plan: Further plan will depend as patient's clinical course evolves and further radiologic and laboratory data become available. Likely home when stable   At the time of admission, it appears that the appropriate admission status for this patient is INPATIENT .Thisis judged to be reasonable and necessary in order to provide the required intensity of service to ensure the patient's safetygiven thepresenting symptoms, physical exam findings, and initial radiographic and laboratory data in the context of their chronic comorbidities.   Richarda Overlie MD [facility] Pager [phone]  If 7PM-7AM, please contact night-coverage [email or web address] Password [facility]  08/09/2017, 9:29 AM

## 2017-08-09 NOTE — ED Triage Notes (Signed)
Pt arriving via EMS from [place]. Pt complaining of rectal pain, upper abdominal pain, has hypotension. Symptoms present for unspecified amount of time.  86/36 HR 100 CBG 260

## 2017-08-09 NOTE — ED Provider Notes (Addendum)
Medical screening examination/treatment/procedure(s) were conducted as a shared visit with non-physician practitioner(s) and myself.  I personally evaluated the patient during the encounter.  His 81 year old female that was checked out to me by previous providers to be admitted.it sounds like on patient arrival she was here for low blood pressure rectal pain. She had a surgery on her abdomen back in February and concern for some type of intra-abdominal infection. Initial EKG was elevated AVR with diffuse depression but thought to be likely related to the low blood pressure. Her initial troponin was 0.46 initial lactic acid was greater than 4. She had antibiotics and fluids. Initial chest x-ray showed some evidence of pulmonary edema after 2 L of fluids her blood pressures improved howeverher oxygen saturation decreased respiratory rate increased concerning for worsening pulmonary edema. History provider is concerning for possible demand ischemia associated wanted order however this was positive as well.her white count is elevated. Still pending urine as a possible source of infection as her CT scan is normal. Chest x-ray also showed evidence of infection. On my evaluation the patient is alert and disoriented which is apparently her baseline.Her abdomen is benign her lungs have mild crackles bilaterally without any respiratory distress however she is on 2 L nasal cannula which is new. At this time I don't have a good reason for her to be hypotensive and it could be related to a cardiogenic cause so we'll start her on heparin and talk to cardiologylikely admit to medicine.No indication for invasive or noninvasive positive pressure ventilation at this point. Medicine agrees for admit requests cardiology consult. I discussed case with Dr. Antoine Poche who agrees with the current course of the plan and states patient can be admitted to [facility] or [facility] whenever the hospitalist prefers and they will consult either  way. Hospitalist updated via text page.   CRITICAL CARE Performed by: Marily Memos Total critical care time: 35 minutes Critical care time was exclusive of separately billable procedures and treating other patients. Critical care was necessary to treat or prevent imminent or life-threatening deterioration. Critical care was time spent personally by me on the following activities: development of treatment plan with patient and/or surrogate as well as nursing, discussions with consultants, evaluation of patient's response to treatment, examination of patient, obtaining history from patient or surrogate, ordering and performing treatments and interventions, ordering and review of laboratory studies, ordering and review of radiographic studies, pulse oximetry and re-evaluation of patient's condition.    EKG Interpretation  Date/Time:  Tuesday August 09 2017 06:41:25 EDT Ventricular Rate:  91 PR Interval:    QRS Duration: 118 QT Interval:  431 QTC Calculation: 531 R Axis:   53 Text Interpretation:  Sinus rhythm Probable left atrial enlargement Incomplete right bundle branch block Low voltage, precordial leads Nonspecific T abnormalities, lateral leads No significant change since last tracing HOURS EARLIER Confirmed by Devoria Albe ([id number]) on 08/09/2017 6:48:57 AM         Maegen Wigle, Barbara Cower, MD 08/09/17 1028    Laurelyn Terrero, Barbara Cower, MD 08/09/17 1028

## 2017-08-10 ENCOUNTER — Inpatient Hospital Stay (HOSPITAL_COMMUNITY): Payer: Medicare Other

## 2017-08-10 DIAGNOSIS — M79609 Pain in unspecified limb: Secondary | ICD-10-CM

## 2017-08-10 DIAGNOSIS — R609 Edema, unspecified: Secondary | ICD-10-CM

## 2017-08-10 DIAGNOSIS — I350 Nonrheumatic aortic (valve) stenosis: Secondary | ICD-10-CM

## 2017-08-10 LAB — ECHOCARDIOGRAM COMPLETE
AO mean calculated velocity dopler: 145 cm/s
AOPV: 0.59 m/s
AOVTI: 33.8 cm
AV Area VTI index: 1.07 cm2/m2
AV Area mean vel: 2.32 cm2
AV Mean grad: 11 mmHg
AV Peak grad: 22 mmHg
AV pk vel: 232 cm/s
AVAREAMEANVIN: 1.18 cm2/m2
AVAREAVTI: 2.06 cm2
AVCELMEANRAT: 0.67
CHL CUP AV PEAK INDEX: 1.05
CHL CUP AV VALUE AREA INDEX: 1.07
CHL CUP AV VEL: 2.1
CHL CUP RV SYS PRESS: 33 mmHg
CHL CUP TV REG PEAK VELOCITY: 215 cm/s
E decel time: 306 msec
EERAT: 13.04
FS: 38 % (ref 28–44)
Height: 64 in
IVS/LV PW RATIO, ED: 1.4
LA ID, A-P, ES: 39 mm
LA diam end sys: 39 mm
LA vol A4C: 51.9 ml
LA vol: 49.5 mL
LADIAMINDEX: 1.99 cm/m2
LAVOLIN: 25.3 mL/m2
LDCA: 3.46 cm2
LV E/e' medial: 13.04
LV E/e'average: 13.04
LV PW d: 11.4 mm — AB (ref 0.6–1.1)
LVELAT: 6.74 cm/s
LVOT SV: 71 mL
LVOT VTI: 20.5 cm
LVOT peak grad rest: 8 mmHg
LVOTD: 21 mm
LVOTPV: 138 cm/s
LVOTVTI: 0.61 cm
MV Dec: 306
MV Peak grad: 3 mmHg
MVPKAVEL: 111 m/s
MVPKEVEL: 87.9 m/s
RV LATERAL S' VELOCITY: 7.89 cm/s
RV TAPSE: 9.68 mm
TDI e' lateral: 6.74
TDI e' medial: 6.42
TRMAXVEL: 215 cm/s
Valve area: 2.1 cm2
Weight: 3200 oz

## 2017-08-10 LAB — COMPREHENSIVE METABOLIC PANEL
ALBUMIN: 3 g/dL — AB (ref 3.5–5.0)
ALT: 23 U/L (ref 14–54)
ANION GAP: 9 (ref 5–15)
AST: 44 U/L — ABNORMAL HIGH (ref 15–41)
Alkaline Phosphatase: 44 U/L (ref 38–126)
BUN: 27 mg/dL — ABNORMAL HIGH (ref 6–20)
CALCIUM: 8.2 mg/dL — AB (ref 8.9–10.3)
CHLORIDE: 108 mmol/L (ref 101–111)
CO2: 18 mmol/L — AB (ref 22–32)
Creatinine, Ser: 1.12 mg/dL — ABNORMAL HIGH (ref 0.44–1.00)
GFR calc non Af Amer: 45 mL/min — ABNORMAL LOW (ref >60)
GFR, EST AFRICAN AMERICAN: 53 mL/min — AB (ref >60)
Glucose, Bld: 182 mg/dL — ABNORMAL HIGH (ref 65–99)
POTASSIUM: 4.6 mmol/L (ref 3.5–5.1)
Sodium: 135 mmol/L (ref 135–145)
Total Bilirubin: 0.5 mg/dL (ref 0.3–1.2)
Total Protein: 7.6 g/dL (ref 6.5–8.1)

## 2017-08-10 LAB — CBC
HCT: 33.8 % — ABNORMAL LOW (ref 36.0–46.0)
Hemoglobin: 10.6 g/dL — ABNORMAL LOW (ref 12.0–15.0)
MCH: 31.4 pg (ref 26.0–34.0)
MCHC: 31.4 g/dL (ref 30.0–36.0)
MCV: 100 fL (ref 78.0–100.0)
PLATELETS: 204 10*3/uL (ref 150–400)
RBC: 3.38 MIL/uL — ABNORMAL LOW (ref 3.87–5.11)
RDW: 17.1 % — AB (ref 11.5–15.5)
WBC: 13.5 10*3/uL — ABNORMAL HIGH (ref 4.0–10.5)

## 2017-08-10 LAB — GLUCOSE, CAPILLARY
GLUCOSE-CAPILLARY: 148 mg/dL — AB (ref 65–99)
Glucose-Capillary: 210 mg/dL — ABNORMAL HIGH (ref 65–99)
Glucose-Capillary: 217 mg/dL — ABNORMAL HIGH (ref 65–99)
Glucose-Capillary: 152 mg/dL — ABNORMAL HIGH (ref 65–99)

## 2017-08-10 LAB — MRSA PCR SCREENING: MRSA BY PCR: POSITIVE — AB

## 2017-08-10 LAB — HEPARIN LEVEL (UNFRACTIONATED)

## 2017-08-10 MED ORDER — MUPIROCIN 2 % EX OINT
1.0000 "application " | TOPICAL_OINTMENT | Freq: Two times a day (BID) | CUTANEOUS | Status: AC
  Administered 2017-08-10 – 2017-08-14 (×10): 1 via NASAL
  Filled 2017-08-10 (×2): qty 22

## 2017-08-10 MED ORDER — MAGNESIUM SULFATE 2 GM/50ML IV SOLN
2.0000 g | Freq: Once | INTRAVENOUS | Status: AC
  Administered 2017-08-10: 2 g via INTRAVENOUS
  Filled 2017-08-10: qty 50

## 2017-08-10 MED ORDER — DEXTROSE 5 % IV SOLN
1.0000 g | INTRAVENOUS | Status: DC
  Administered 2017-08-10: 1 g via INTRAVENOUS
  Filled 2017-08-10 (×2): qty 10

## 2017-08-10 MED ORDER — PERFLUTREN LIPID MICROSPHERE
INTRAVENOUS | Status: AC
  Filled 2017-08-10: qty 10

## 2017-08-10 MED ORDER — PERFLUTREN LIPID MICROSPHERE
1.0000 mL | INTRAVENOUS | Status: AC | PRN
  Administered 2017-08-10: 4.5 mL via INTRAVENOUS
  Filled 2017-08-10: qty 10

## 2017-08-10 MED ORDER — CHLORHEXIDINE GLUCONATE CLOTH 2 % EX PADS
6.0000 | MEDICATED_PAD | Freq: Every day | CUTANEOUS | Status: AC
  Administered 2017-08-10 – 2017-08-14 (×5): 6 via TOPICAL

## 2017-08-10 NOTE — Progress Notes (Signed)
PROGRESS NOTE    [employer]  WUJ:[id number] DOB: [birth year] DOA: 08/09/2017 PCP: Merri Brunette, MD    Brief Narrative:  81 year old female with a history of anxiety, depression, diabetes, hypertension, sleep apnea, morbid obesity, resident of [name], who presented to the ED because of low blood pressure. Patient has underlying dementia and is a very poor historian and patient's daughter who is by the bedside is unable to provide any meaningful history. According to the chart patient was brought in  early this morning due to low blood pressure, complains of upper abdominal pain, rectal pain. Patient's initial blood pressure was 86/ 36. CBG was 260. Reportedly has also had decreased oral intake, patient appears to be dehydrated clinically on exam.  After receiving about 2 L of fluid patient's blood pressure has improved. Workup reveals glucose of 227, bicarbonate of 19, troponin 0.46, white blood cell count 14.1, UA negative, chest x-ray shows mild cardiomegaly with central bony vascular prominence without overt CHF, CT abdomen and pelvis shows cholelithiasis without inflammation Patient admitted for possible ACS, low blood pressure, cardiology has been consulted   Assessment & Plan:   Principal Problem:   ACS (acute coronary syndrome) (HCC) Active Problems:   OSA (obstructive sleep apnea)   Depression   Essential hypertension   Diabetes (HCC)   Chronic pain syndrome   Acute on chronic respiratory failure with hypoxemia (HCC)   Chest pain   Elevated troponin;  Per cardiology less likely ischemic event.  Continue with IV heparin, until ECHO results available.  EKG; prolong QT.   Hypotension;  UA, chest x ray negative.  Related to dehydration. Continue with IV fluids.   Leukocytosis;  Check urine culture.  Chest x ray negative for PNA.   Left Lower extremity edema, hip pain.  Check x ray. Report history of fall at home.  Check doppler.  Will start antibiotics to cover  for cellulitis due to leukocytosis.   DM; lantus, SSI.   Abdominal pain-CT abdomen pelvis shows cirrhosis of the liver, otherwise no acute pathology Denies abdominal pain.    Mild dementia ; hold Namenda, Zyprexa due to prolong QT.   Hypomagnesemia IV mag ordered.     Depression-continue Xanax and Cymbalta     DVT prophylaxis: on heparin  Code Status: full code.  Family Communication: no family at bedside.  Disposition Plan: to be determine.    Consultants:   Cardiology    Procedures:   ECHO    Antimicrobials:   Subjective: Denies abdominal pain. She is complaining of left LE pain and hip pain.    Objective: Vitals:   08/09/17 1200 08/09/17 1300 08/09/17 2054 08/10/17 0504  BP: 112/83 (!) 103/59 (!) 123/53 120/61  Pulse: 87 85 74 75  Resp: 19 (!) 21 (!) 22 20  Temp:   98.3 F (36.8 C) 97.7 F (36.5 C)  TempSrc:   Oral Oral  SpO2: 95% 92% 96% 96%  Weight:      Height:        Intake/Output Summary (Last 24 hours) at 08/10/17 0801 Last data filed at 08/10/17 0700  Gross per 24 hour  Intake          1418.57 ml  Output              100 ml  Net          1318.57 ml   Filed Weights   08/09/17 0851  Weight: 90.7 kg (200 lb)    Examination:  General exam: Appears  calm and comfortable  Respiratory system: Clear to auscultation. Respiratory effort normal. Cardiovascular system: S1 & S2 heard, RRR. No JVD, murmurs, rubs, gallops or clicks. Left LE with hyperpigmentation and edema.  Gastrointestinal system: Abdomen is nondistended, soft and nontender. No organomegaly or masses felt. Normal bowel sounds heard. Central nervous system: Alert and oriented. No focal neurological deficits. Extremities: Symmetric 5 x 5 power. Skin: No rashes, lesions or ulcers Psychiatry: Judgement and insight appear normal. Mood & affect appropriate.     Data Reviewed: I have personally reviewed following labs and imaging studies  CBC:  Recent Labs Lab 08/09/17 0422  08/10/17 0141  WBC 14.1* 13.5*  NEUTROABS 11.1*  --   HGB 13.1 10.6*  HCT 39.8 33.8*  MCV 99.0 100.0  PLT 225 204   Basic Metabolic Panel:  Recent Labs Lab 08/09/17 0422 08/09/17 1321 08/10/17 0141  NA 136  --  135  K 4.1  --  4.6  CL 103  --  108  CO2 19*  --  18*  GLUCOSE 227*  --  182*  BUN 18  --  27*  CREATININE 1.20*  --  1.12*  CALCIUM 9.5  --  8.2*  MG  --  1.5*  --    GFR: Estimated Creatinine Clearance: 44.4 mL/min (A) (by C-G formula based on SCr of 1.12 mg/dL (H)). Liver Function Tests:  Recent Labs Lab 08/09/17 0422 08/10/17 0141  AST 56* 44*  ALT 28 23  ALKPHOS 62 44  BILITOT 0.6 0.5  PROT 8.7* 7.6  ALBUMIN 3.2* 3.0*   No results for input(s): LIPASE, AMYLASE in the last 168 hours. No results for input(s): AMMONIA in the last 168 hours. Coagulation Profile: No results for input(s): INR, PROTIME in the last 168 hours. Cardiac Enzymes:  Recent Labs Lab 08/09/17 0422 08/09/17 0645 08/09/17 1321 08/09/17 1539 08/09/17 2128  TROPONINI 0.46* 0.46* 0.49* 0.48* 0.52*   BNP (last 3 results) No results for input(s): PROBNP in the last 8760 hours. HbA1C:  Recent Labs  08/09/17 1318  HGBA1C 6.9*   CBG:  Recent Labs Lab 08/09/17 1239 08/09/17 1718 08/09/17 2059 08/10/17 0720  GLUCAP 159* 159* 180* 210*   Lipid Profile: No results for input(s): CHOL, HDL, LDLCALC, TRIG, CHOLHDL, LDLDIRECT in the last 72 hours. Thyroid Function Tests:  Recent Labs  08/09/17 1321  TSH 3.314   Anemia Panel: No results for input(s): VITAMINB12, FOLATE, FERRITIN, TIBC, IRON, RETICCTPCT in the last 72 hours. Sepsis Labs:  Recent Labs Lab 08/09/17 0422 08/09/17 0850  LATICACIDVEN 4.7* 3.2*    No results found for this or any previous visit (from the past 240 hour(s)).       Radiology Studies: Ct Abdomen Pelvis W Contrast  Result Date: 08/09/2017 CLINICAL DATA:  Upper abdominal and rectal pain. EXAM: CT ABDOMEN AND PELVIS WITH CONTRAST  TECHNIQUE: Multidetector CT imaging of the abdomen and pelvis was performed using the standard protocol following bolus administration of intravenous contrast. CONTRAST:  80 mL of Isovue-300 intravenously. COMPARISON:  CT scan of December 22, 2016. FINDINGS: Lower chest: No acute abnormality. Hepatobiliary: Dilated gallbladder is noted with cholelithiasis, but no surrounding inflammation is noted. Stable calcification is seen in posterior segment of right hepatic lobe. Nodular hepatic contours are noted suggesting hepatic cirrhosis. Pancreas: Unremarkable. No pancreatic ductal dilatation or surrounding inflammatory changes. Spleen: Normal in size without focal abnormality. Adrenals/Urinary Tract: Adrenal glands are unremarkable. Kidneys are normal, without renal calculi, focal lesion, or hydronephrosis. Bladder is unremarkable. Stomach/Bowel: The  stomach appears normal. There is no evidence of bowel obstruction or inflammation. Diverticulosis of transverse and descending colon is noted. The appendix is not visualized. Vascular/Lymphatic: Aortic atherosclerosis. No enlarged abdominal or pelvic lymph nodes. Reproductive: Status post hysterectomy. No adnexal masses. Other: No abdominal wall hernia or abnormality. No abdominopelvic ascites. Musculoskeletal: No acute or significant osseous findings. IMPRESSION: Cholelithiasis without inflammation. Nodular hepatic contours are noted suggesting hepatic cirrhosis. Diverticulosis of transverse and descending colon is noted without inflammation. Aortic atherosclerosis. Electronically Signed   By: Lupita Raider, M.D.   On: 08/09/2017 07:53   Dg Chest Port 1 View  Result Date: 08/09/2017 CLINICAL DATA:  Shortness of breath this morning. Upper abdominal pain. EXAM: PORTABLE CHEST 1 VIEW COMPARISON:  Radiograph 12/23/2016 FINDINGS: Mild cardiomegaly, similar. Minimal central vascular congestion without pulmonary edema. No consolidation, pleural fluid or pneumothorax. No  acute osseous abnormality. IMPRESSION: Stable mild cardiomegaly. Central pulmonary vascular prominence without overt CHF. Electronically Signed   By: Rubye Oaks M.D.   On: 08/09/2017 05:07        Scheduled Meds: . aspirin EC  81 mg Oral Daily  . DULoxetine  30 mg Oral Daily  . feeding supplement  1 Container Oral TID BM  . insulin aspart  0-9 Units Subcutaneous TID WC  . insulin glargine  12 Units Subcutaneous QHS  . memantine  10 mg Oral BID  . OLANZapine  2.5 mg Oral QHS  . potassium chloride  20 mEq Oral Daily  . saccharomyces boulardii  250 mg Oral BID   Continuous Infusions: . sodium chloride 75 mL/hr at 08/10/17 0554  . heparin 1,100 Units/hr (08/10/17 0554)     LOS: 1 day    Time spent: 35 minutes.     Alba Cory, MD [facility] Pager [phone]  If 7PM-7AM, please contact night-coverage [email or web address] Password [email or web address] 08/10/2017, 8:01 AM

## 2017-08-10 NOTE — Progress Notes (Signed)
*  PRELIMINARY RESULTS* Vascular Ultrasound Left lower extremity venous duplex has been completed.  Preliminary findings: No evidence of deep vein thrombosis in the visualized veins of the left lower extremity.  Negative for baker's cyst on the left.     Chauncey Fischer 08/10/2017, 2:23 PM

## 2017-08-10 NOTE — Progress Notes (Signed)
Echo without new wall motion abnormalities, no significant change from previous. OK to stop heparin drip.  Roe Rutherford Tocara Mennen, PA-C 08/10/2017, 2:46 PM [phone]

## 2017-08-10 NOTE — Progress Notes (Signed)
ANTICOAGULATION CONSULT NOTE - Follow Up Consult  Pharmacy Consult for Heparin Indication: chest pain/ACS  Allergies  Allergen Reactions  . Prednisone Shortness Of Breath  . Ibuprofen Other (See Comments)    Swelling mouth and lips  . Meclizine Other (See Comments)    HALLUCINATIONS  . Sulfonamide Derivatives     REACTION: Reaction not known    Patient Measurements: Height:  (162.6 cm) Weight: 200 lb (90.7 kg) IBW/kg (Calculated) : 54.7 Heparin Dosing Weight:   Vital Signs: Temp: 98.3 F (36.8 C) (09/25 2054) Temp Source: Oral (09/25 2054) BP: 123/53 (09/25 2054) Pulse Rate: 74 (09/25 2054)  Labs:  Recent Labs  08/09/17 0422  08/09/17 1321 08/09/17 1539 08/09/17 2128 08/10/17 0141  HGB 13.1  --   --   --   --  10.6*  HCT 39.8  --   --   --   --  33.8*  PLT 225  --   --   --   --  204  HEPARINUNFRC  --   --   --   --   --  <0.10*  CREATININE 1.20*  --   --   --   --  1.12*  TROPONINI 0.46*  < > 0.49* 0.48* 0.52*  --   < > = values in this interval not displayed.  Estimated Creatinine Clearance: 44.4 mL/min (A) (by C-G formula based on SCr of 1.12 mg/dL (H)).   Medications:  Infusions:  . sodium chloride 75 mL/hr at 08/09/17 1621  . heparin 900 Units/hr (08/09/17 1625)    Assessment: Patient with low heparin level.  No heparin issues per RN.  Goal of Therapy:  Heparin level 0.3-0.7 units/ml Monitor platelets by anticoagulation protocol: Yes   Plan:  Increase heparin to 1100 units/hr Recheck level at [address], [place] 08/10/2017,2:43 AM

## 2017-08-10 NOTE — Progress Notes (Addendum)
Progress Note  Patient Name: Adrienne [name] Date of Encounter: 08/10/2017  Primary Cardiologist: Dr. [place]  Subjective   Pt states she had an episode of chest pain last evening after eating dinner. It was described as a burning sensation relieved with time. No associated symptoms, no repeat episodes since.  Inpatient Medications    Scheduled Meds: . aspirin EC  81 mg Oral Daily  . Chlorhexidine Gluconate Cloth  6 each Topical Q0600  . DULoxetine  30 mg Oral Daily  . feeding supplement  1 Container Oral TID BM  . insulin aspart  0-9 Units Subcutaneous TID WC  . insulin glargine  12 Units Subcutaneous QHS  . mupirocin ointment  1 application Nasal BID  . OLANZapine  2.5 mg Oral QHS  . potassium chloride  20 mEq Oral Daily  . saccharomyces boulardii  250 mg Oral BID   Continuous Infusions: . sodium chloride 75 mL/hr at 08/10/17 0554  . heparin 1,100 Units/hr (08/10/17 0809)  . magnesium sulfate 1 - 4 g bolus IVPB     PRN Meds: acetaminophen **OR** acetaminophen, ALPRAZolam, fentaNYL (SUBLIMAZE) injection, levalbuterol, ondansetron **OR** ondansetron (ZOFRAN) IV, polyethylene glycol   Vital Signs    Vitals:   08/09/17 1300 08/09/17 2054 08/10/17 0504 08/10/17 0809  BP: (!) 103/59 (!) 123/53 120/61 (!) 114/52  Pulse: 85 74 75 71  Resp: (!) 21 (!) Temp:  98.3 F (36.8 C) 97.7 F (36.5 C) 97.6 F (36.4 C)  TempSrc:  Oral Oral Oral  SpO2: 92% 96% 96% 98%  Weight:      Height:        Intake/Output Summary (Last 24 hours) at 08/10/17 1003 Last data filed at 08/10/17 0700  Gross per 24 hour  Intake          1418.57 ml  Output              100 ml  Net          1318.57 ml   Filed Weights   08/09/17 0851  Weight: 200 lb (90.7 kg)     Physical Exam   General: Well developed, well nourished, female appearing in no acute distress. Head: Normocephalic, atraumatic.  Neck: Supple without bruits, no JVD Lungs:  Resp regular and unlabored, CTA. Heart:  RRR, no murmur; no rub. Abdomen: Soft, non-tender, non-distended with normoactive bowel sounds. No hepatomegaly. No rebound/guarding. No obvious abdominal masses. Extremities: No clubbing, cyanosis, trace edema L >R. Distal pedal pulses are faint bilaterally. Neuro: Alert and oriented X 3. Moves all extremities spontaneously. Psych: Normal affect.  Labs    Chemistry Recent Labs Lab 08/09/17 0422 08/10/17 0141  NA 136 135  K 4.1 4.6  CL 103 108  CO2 19* 18*  GLUCOSE 227* 182*  BUN 18 27*  CREATININE 1.20* 1.12*  CALCIUM 9.5 8.2*  PROT 8.7* 7.6  ALBUMIN 3.2* 3.0*  AST 56* 44*  ALT 28 23  ALKPHOS 62 44  BILITOT 0.6 0.5  GFRNONAA 42* 45*  GFRAA 48* 53*  ANIONGAP 14 9     Hematology Recent Labs Lab 08/09/17 0422 08/10/17 0141  WBC 14.1* 13.5*  RBC 4.02 3.38*  HGB 13.1 10.6*  HCT 39.8 33.8*  MCV 99.0 100.0  MCH 32.6 31.4  MCHC 32.9 31.4  RDW 16.1* 17.1*  PLT 225 204    Cardiac Enzymes Recent Labs Lab 08/09/17 0645 08/09/17 1321 08/09/17 1539 08/09/17 2128  TROPONINI 0.46* 0.49* 0.48* 0.52*   No  results for input(s): TROPIPOC in the last 168 hours.   BNPNo results for input(s): BNP, PROBNP in the last 168 hours.   DDimer No results for input(s): DDIMER in the last 168 hours.   Radiology    Ct Abdomen Pelvis W Contrast  Result Date: 08/09/2017 CLINICAL DATA:  Upper abdominal and rectal pain. EXAM: CT ABDOMEN AND PELVIS WITH CONTRAST TECHNIQUE: Multidetector CT imaging of the abdomen and pelvis was performed using the standard protocol following bolus administration of intravenous contrast. CONTRAST:  80 mL of Isovue-300 intravenously. COMPARISON:  CT scan of December 22, 2016. FINDINGS: Lower chest: No acute abnormality. Hepatobiliary: Dilated gallbladder is noted with cholelithiasis, but no surrounding inflammation is noted. Stable calcification is seen in posterior segment of right hepatic lobe. Nodular hepatic contours are noted suggesting hepatic  cirrhosis. Pancreas: Unremarkable. No pancreatic ductal dilatation or surrounding inflammatory changes. Spleen: Normal in size without focal abnormality. Adrenals/Urinary Tract: Adrenal glands are unremarkable. Kidneys are normal, without renal calculi, focal lesion, or hydronephrosis. Bladder is unremarkable. Stomach/Bowel: The stomach appears normal. There is no evidence of bowel obstruction or inflammation. Diverticulosis of transverse and descending colon is noted. The appendix is not visualized. Vascular/Lymphatic: Aortic atherosclerosis. No enlarged abdominal or pelvic lymph nodes. Reproductive: Status post hysterectomy. No adnexal masses. Other: No abdominal wall hernia or abnormality. No abdominopelvic ascites. Musculoskeletal: No acute or significant osseous findings. IMPRESSION: Cholelithiasis without inflammation. Nodular hepatic contours are noted suggesting hepatic cirrhosis. Diverticulosis of transverse and descending colon is noted without inflammation. Aortic atherosclerosis. Electronically Signed   By: Lupita Raider, M.D.   On: 08/09/2017 07:53   Dg Chest Port 1 View  Result Date: 08/09/2017 CLINICAL DATA:  Shortness of breath this morning. Upper abdominal pain. EXAM: PORTABLE CHEST 1 VIEW COMPARISON:  Radiograph 12/23/2016 FINDINGS: Mild cardiomegaly, similar. Minimal central vascular congestion without pulmonary edema. No consolidation, pleural fluid or pneumothorax. No acute osseous abnormality. IMPRESSION: Stable mild cardiomegaly. Central pulmonary vascular prominence without overt CHF. Electronically Signed   By: Rubye Oaks M.D.   On: 08/09/2017 05:07     Telemetry    Sinus rhythm - Personally Reviewed  ECG    No new tracings - Personally Reviewed   Cardiac Studies   Echo 08/09/17: pending read  Echo 11/03/13: Study Conclusions - Left ventricle: Wall thickness was increased in a pattern of moderate LVH. There was moderate concentric hypertrophy. Systolic  function was normal. The estimated ejection fraction was in the range of 55% to 60%. Wall motion was normal; there were no regional wall motion abnormalities. Doppler parameters are consistent with abnormal left ventricular relaxation (grade 1 diastolic dysfunction). - Aortic valve: Valve area: 2.16cm^2 (Vmax). - Left atrium: The atrium was mildly dilated. Transthoracic echocardiography. M-mode, complete 2D, spectral Doppler, and color Doppler.   Patient Profile     81 y.o. female hx of HTN, obesity, DM, dementia and HLD but no known CAD with normal stress test in 2012 now presents to ER form [facility] with complaints of rectal and abd pain.  She is also hypotensive.   She had decreased oral intake.  In ER IV fluids given with improved BP.  Assessment & Plan    1. Elevated troponin - 0.46 --> 0.46 --> 0.49 --> 0.48 --> 0.52 - trend is mildly elevated and flat, unlikely related to an ischemic event - EKG is unchanged - diffuse mild ST depressions - ACS risk factors include DM, HTN, and family history - echo pending - if echo is normal or  unchanged from previous, will mange conservatively - previous stress test in 2012 normal - continue ASA and heparin for now pending echo results - pravastatin on hold  2. HTN - home losartan on hold - pressures have been 110-120s  3. DM - per primary team  4. Chronic diastolic heart failure - euvolemic - repeat echo pending  5. Leukocytosis  - trending down, per primary team   Signed, Marcelino Duster , PA-C 10:03 AM 08/10/2017 Pager: [phone]  History and all data above reviewed.  Patient examined.  I agree with the findings as above.  Still very confused.  There was a report of chest pain but she cannot give any details. The patient exam reveals COR:RRR  ,  Lungs: Clear  ,  Abd: Positive bowel sounds, no rebound no guarding, Ext No edema (improved from previous)  .  All available labs, radiology testing, previous  records reviewed. Agree with documented assessment and plan. CHEST PAIN:  Difficult to assess by symptoms.  Awaiting echo.  Will stop heparin if echo and EKG OK.    Prolonged QT:  She has a very long QT.  I would suggest holding the Zyprexa if possible.  I have stopped the Zofran.   Potassium is OK.  I will supplement the magnesium.    Fayrene Fearing Rhylen Pulido  12:11 PM  08/10/2017

## 2017-08-10 NOTE — Progress Notes (Signed)
  Echocardiogram 2D Echocardiogram with definity has been performed.  Leta Jungling M 08/10/2017, 1:30 PM

## 2017-08-11 LAB — BLOOD CULTURE ID PANEL (REFLEXED)
Acinetobacter baumannii: NOT DETECTED
CANDIDA GLABRATA: NOT DETECTED
CANDIDA KRUSEI: NOT DETECTED
CANDIDA PARAPSILOSIS: NOT DETECTED
Candida albicans: NOT DETECTED
Candida tropicalis: NOT DETECTED
ENTEROBACTER CLOACAE COMPLEX: NOT DETECTED
ESCHERICHIA COLI: NOT DETECTED
Enterobacteriaceae species: NOT DETECTED
Enterococcus species: NOT DETECTED
Haemophilus influenzae: NOT DETECTED
KLEBSIELLA OXYTOCA: NOT DETECTED
Klebsiella pneumoniae: NOT DETECTED
Listeria monocytogenes: NOT DETECTED
Neisseria meningitidis: NOT DETECTED
PROTEUS SPECIES: NOT DETECTED
PSEUDOMONAS AERUGINOSA: NOT DETECTED
STREPTOCOCCUS PNEUMONIAE: NOT DETECTED
STREPTOCOCCUS PYOGENES: NOT DETECTED
Serratia marcescens: NOT DETECTED
Staphylococcus aureus (BCID): NOT DETECTED
Staphylococcus species: NOT DETECTED
Streptococcus agalactiae: NOT DETECTED
Streptococcus species: NOT DETECTED

## 2017-08-11 LAB — GLUCOSE, CAPILLARY
GLUCOSE-CAPILLARY: 145 mg/dL — AB (ref 65–99)
GLUCOSE-CAPILLARY: 125 mg/dL — AB (ref 65–99)
Glucose-Capillary: 148 mg/dL — ABNORMAL HIGH (ref 65–99)
Glucose-Capillary: 147 mg/dL — ABNORMAL HIGH (ref 65–99)

## 2017-08-11 LAB — BASIC METABOLIC PANEL
ANION GAP: 9 (ref 5–15)
BUN: 20 mg/dL (ref 6–20)
CALCIUM: 8 mg/dL — AB (ref 8.9–10.3)
CHLORIDE: 109 mmol/L (ref 101–111)
CO2: 18 mmol/L — AB (ref 22–32)
Creatinine, Ser: 0.8 mg/dL (ref 0.44–1.00)
GFR calc non Af Amer: >60 mL/min (ref >60)
GLUCOSE: 211 mg/dL — AB (ref 65–99)
POTASSIUM: 4.4 mmol/L (ref 3.5–5.1)
Sodium: 136 mmol/L (ref 135–145)

## 2017-08-11 LAB — CBC
HEMATOCRIT: 31.5 % — AB (ref 36.0–46.0)
HEMOGLOBIN: 9.9 g/dL — AB (ref 12.0–15.0)
MCH: 31.3 pg (ref 26.0–34.0)
MCHC: 31.4 g/dL (ref 30.0–36.0)
MCV: 99.7 fL (ref 78.0–100.0)
Platelets: 159 10*3/uL (ref 150–400)
RBC: 3.16 MIL/uL — ABNORMAL LOW (ref 3.87–5.11)
RDW: 17.1 % — ABNORMAL HIGH (ref 11.5–15.5)
WBC: 8.3 10*3/uL (ref 4.0–10.5)

## 2017-08-11 LAB — MAGNESIUM: MAGNESIUM: 2.1 mg/dL (ref 1.7–2.4)

## 2017-08-11 MED ORDER — DEXTROSE 5 % IV SOLN
2.0000 g | INTRAVENOUS | Status: DC
  Administered 2017-08-11 – 2017-08-14 (×4): 2 g via INTRAVENOUS
  Filled 2017-08-11 (×4): qty 2

## 2017-08-11 MED ORDER — POLYETHYLENE GLYCOL 3350 17 G PO PACK
17.0000 g | PACK | Freq: Two times a day (BID) | ORAL | Status: DC
  Administered 2017-08-11 – 2017-08-14 (×7): 17 g via ORAL
  Filled 2017-08-11 (×7): qty 1

## 2017-08-11 MED ORDER — SENNOSIDES-DOCUSATE SODIUM 8.6-50 MG PO TABS
1.0000 | ORAL_TABLET | Freq: Two times a day (BID) | ORAL | Status: DC
  Administered 2017-08-11 – 2017-08-14 (×7): 1 via ORAL
  Filled 2017-08-11 (×7): qty 1

## 2017-08-11 MED ORDER — BISACODYL 10 MG RE SUPP
10.0000 mg | Freq: Once | RECTAL | Status: AC
  Administered 2017-08-11: 10 mg via RECTAL
  Filled 2017-08-11: qty 1

## 2017-08-11 MED ORDER — OLANZAPINE 5 MG PO TABS: 2.5000 mg | ORAL_TABLET | Freq: Every day | ORAL | Status: DC

## 2017-08-11 NOTE — Progress Notes (Signed)
PROGRESS NOTE    Adrienne [name]  UJW:[id number] DOB: 1936/01/19 DOA: 08/09/2017 PCP: Merri Brunette, MD    Brief Narrative:  81 year old female with a history of anxiety, depression, diabetes, hypertension, sleep apnea, morbid obesity, resident of [name], who presented to the ED because of low blood pressure. Patient has underlying dementia and is a very poor historian and patient's daughter who is by the bedside is unable to provide any meaningful history. According to the chart patient was brought in  early this morning due to low blood pressure, complains of upper abdominal pain, rectal pain. Patient's initial blood pressure was 86/ 36. CBG was 260. Reportedly has also had decreased oral intake, patient appears to be dehydrated clinically on exam.  After receiving about 2 L of fluid patient's blood pressure has improved. Workup reveals glucose of 227, bicarbonate of 19, troponin 0.46, white blood cell count 14.1, UA negative, chest x-ray shows mild cardiomegaly with central bony vascular prominence without overt CHF, CT abdomen and pelvis shows cholelithiasis without inflammation Patient admitted for possible ACS, low blood pressure, cardiology has been consulted   Assessment & Plan:   Principal Problem:   ACS (acute coronary syndrome) (HCC) Active Problems:   OSA (obstructive sleep apnea)   Depression   Essential hypertension   Diabetes (HCC)   Chronic pain syndrome   Acute on chronic respiratory failure with hypoxemia (HCC)   Chest pain   Elevated troponin;  Per cardiology less likely ischemic event.  ECHO no wall motion abnormalities. Heparin discontinue.  EKG; prolong QT.  Stress test out patient   Hypotension;  UA, chest x ray negative.  Related to dehydration. Continue with IV fluids.   Leukocytosis; one of 2 blood culture growing gram positive rods.  Continue with ceftriaxone.  FU urine culture.  Chest x ray negative for PNA.   Left Lower extremity edema, hip  pain.  Check x ray. Report history of fall at home.  Doppler negative.  X ray negative for fracture.    DM; lantus, SSI.   Abdominal pain-CT abdomen pelvis shows cirrhosis of the liver, otherwise no acute pathology No BM. Laxative ordered, suppository    Mild dementia ; hold Namenda, Zyprexa due to prolong QT. Psych consulted.   Hypomagnesemia replace,.   Depression-continue Xanax and Cymbalta     DVT prophylaxis: on heparin  Code Status: full code.  Family Communication: no family at bedside.  Disposition Plan: to be determine.    Consultants:   Cardiology    Procedures:   ECHO    Antimicrobials:   Subjective: She has not had a BM in several day.    Objective: Vitals:   08/10/17 2136 08/11/17 0548 08/11/17 1257 08/11/17 1301  BP: (!) 119/48 (!) 135/56 129/74   Pulse: 89 74 80   Resp:  18 18   Temp: 98.1 F (36.7 C) 98.5 F (36.9 C) 98.3 F (36.8 C)   TempSrc: Oral Oral Oral Oral  SpO2: 94% 96% 96%   Weight:      Height:        Intake/Output Summary (Last 24 hours) at 08/11/17 1749 Last data filed at 08/11/17 1650  Gross per 24 hour  Intake           1477.5 ml  Output              900 ml  Net            577.5 ml   Filed Weights   08/09/17 0851  Weight: 90.7 kg (200 lb)    Examination:  General exam: NAD Respiratory system:CTA Cardiovascular system:  S 1, S 2 RRR Gastrointestinal system: Abdomen is distended, soft, nt Central nervous system: non focal.  Extremities: Symmetric 5 x 5 power. Skin: No rashes, lesions or ulcers Psychiatry: Judgement and insight appear normal. Mood & affect appropriate.     Data Reviewed: I have personally reviewed following labs and imaging studies  CBC:  Recent Labs Lab 08/09/17 0422 08/10/17 0141 08/11/17 0940  WBC 14.1* 13.5* 8.3  NEUTROABS 11.1*  --   --   HGB 13.1 10.6* 9.9*  HCT 39.8 33.8* 31.5*  MCV 99.0 100.0 99.7  PLT 225 204 159   Basic Metabolic Panel:  Recent Labs Lab  08/09/17 0422 08/09/17 1321 08/10/17 0141 08/11/17 0516 08/11/17 0940  NA 136  --  135  --  136  K 4.1  --  4.6  --  4.4  CL 103  --  108  --  109  CO2 19*  --  18*  --  18*  GLUCOSE 227*  --  182*  --  211*  BUN 18  --  27*  --  20  CREATININE 1.20*  --  1.12*  --  0.80  CALCIUM 9.5  --  8.2*  --  8.0*  MG  --  1.5*  --  2.1  --    GFR: Estimated Creatinine Clearance: 62.2 mL/min (by C-G formula based on SCr of 0.8 mg/dL). Liver Function Tests:  Recent Labs Lab 08/09/17 0422 08/10/17 0141  AST 56* 44*  ALT 28 23  ALKPHOS 62 44  BILITOT 0.6 0.5  PROT 8.7* 7.6  ALBUMIN 3.2* 3.0*   No results for input(s): LIPASE, AMYLASE in the last 168 hours. No results for input(s): AMMONIA in the last 168 hours. Coagulation Profile: No results for input(s): INR, PROTIME in the last 168 hours. Cardiac Enzymes:  Recent Labs Lab 08/09/17 0422 08/09/17 0645 08/09/17 1321 08/09/17 1539 08/09/17 2128  TROPONINI 0.46* 0.46* 0.49* 0.48* 0.52*   BNP (last 3 results) No results for input(s): PROBNP in the last 8760 hours. HbA1C:  Recent Labs  08/09/17 1318  HGBA1C 6.9*   CBG:  Recent Labs Lab 08/10/17 1645 08/10/17 2218 08/11/17 0809 08/11/17 1218 08/11/17 1746  GLUCAP 152* 148* 148* 147* 145*   Lipid Profile: No results for input(s): CHOL, HDL, LDLCALC, TRIG, CHOLHDL, LDLDIRECT in the last 72 hours. Thyroid Function Tests:  Recent Labs  08/09/17 1321  TSH 3.314   Anemia Panel: No results for input(s): VITAMINB12, FOLATE, FERRITIN, TIBC, IRON, RETICCTPCT in the last 72 hours. Sepsis Labs:  Recent Labs Lab 08/09/17 0422 08/09/17 0850  LATICACIDVEN 4.7* 3.2*    Recent Results (from the past 240 hour(s))  Culture, blood (Routine X 2) w Reflex to ID Panel     Status: None (Preliminary result)   Collection Time: 08/09/17  1:18 PM  Result Value Ref Range Status   Specimen Description BLOOD RIGHT ARM  Final   Special Requests IN PEDIATRIC BOTTLE Blood  Culture adequate volume  Final   Culture   Final    NO GROWTH 2 DAYS Performed at [facility], [address], [place], [place] [address]    Report Status PENDING  Incomplete  Culture, blood (Routine X 2) w Reflex to ID Panel     Status: None (Preliminary result)   Collection Time: 08/09/17  1:24 PM  Result Value Ref Range Status  Specimen Description BLOOD RIGHT ARM  Final   Special Requests IN PEDIATRIC BOTTLE Blood Culture adequate volume  Final   Culture  Setup Time   Final    GRAM POSITIVE RODS IN PEDIATRIC BOTTLE Organism ID to follow CRITICAL RESULT CALLED TO, READ BACK BY AND VERIFIED WITH: C. Shade Pharm.D. 15:10 08/11/17 ([email or web address]) Performed at [facility], [address] [place], [place] [address]    Culture GRAM POSITIVE RODS  Final   Report Status PENDING  Incomplete  Blood Culture ID Panel (Reflexed)     Status: None   Collection Time: 08/09/17  1:24 PM  Result Value Ref Range Status   Enterococcus species NOT DETECTED NOT DETECTED Final   Listeria monocytogenes NOT DETECTED NOT DETECTED Final   Staphylococcus species NOT DETECTED NOT DETECTED Final   Staphylococcus aureus NOT DETECTED NOT DETECTED Final   Streptococcus species NOT DETECTED NOT DETECTED Final   Streptococcus agalactiae NOT DETECTED NOT DETECTED Final   Streptococcus pneumoniae NOT DETECTED NOT DETECTED Final   Streptococcus pyogenes NOT DETECTED NOT DETECTED Final   Acinetobacter baumannii NOT DETECTED NOT DETECTED Final   Enterobacteriaceae species NOT DETECTED NOT DETECTED Final   Enterobacter cloacae complex NOT DETECTED NOT DETECTED Final   Escherichia coli NOT DETECTED NOT DETECTED Final   Klebsiella oxytoca NOT DETECTED NOT DETECTED Final   Klebsiella pneumoniae NOT DETECTED NOT DETECTED Final   Proteus species NOT DETECTED NOT DETECTED Final   Serratia marcescens NOT DETECTED NOT DETECTED Final   Haemophilus influenzae NOT DETECTED NOT DETECTED Final   Neisseria meningitidis  NOT DETECTED NOT DETECTED Final   Pseudomonas aeruginosa NOT DETECTED NOT DETECTED Final   Candida albicans NOT DETECTED NOT DETECTED Final   Candida glabrata NOT DETECTED NOT DETECTED Final   Candida krusei NOT DETECTED NOT DETECTED Final   Candida parapsilosis NOT DETECTED NOT DETECTED Final   Candida tropicalis NOT DETECTED NOT DETECTED Final    Comment: Performed at [facility], [address], [place], [place] [address]  MRSA PCR Screening     Status: Abnormal   Collection Time: 08/09/17  4:35 PM  Result Value Ref Range Status   MRSA by PCR POSITIVE (A) NEGATIVE Final    Comment:        The GeneXpert MRSA Assay (FDA approved for NASAL specimens only), is one component of a comprehensive MRSA colonization surveillance program. It is not intended to diagnose MRSA infection nor to guide or monitor treatment for MRSA infections. RESULT CALLED TO, READ BACK BY AND VERIFIED WITH: M.SEARS RN [id number] [email or web address]          Radiology Studies: Dg Tibia/fibula Left  Result Date: 08/10/2017 CLINICAL DATA:  Chronic left lower leg pain after fall 2 years ago. EXAM: LEFT TIBIA AND FIBULA - 2 VIEW COMPARISON:  None. FINDINGS: Old healed distal left tibial fracture is noted. Status post surgical fixation of old distal fibular fracture. No acute fracture or dislocation is seen. No soft tissue abnormality is noted. IMPRESSION: No acute abnormality seen in the left tibia or fibula. Electronically Signed   By: Lupita Raider, M.D.   On: 08/10/2017 12:33   Dg Hips Bilat With Pelvis 2v  Result Date: 08/10/2017 CLINICAL DATA:  Chronic bilateral hip pain without recent injury. EXAM: DG HIP (WITH OR WITHOUT PELVIS) 2V BILAT COMPARISON:  None. FINDINGS: There is no evidence of hip fracture or dislocation. There is no evidence of arthropathy or other focal bone abnormality. IMPRESSION: Normal bilateral  hips. Electronically Signed   By: Lupita Raider, M.D.   On: 08/10/2017 12:24         Scheduled Meds: . aspirin EC  81 mg Oral Daily  . Chlorhexidine Gluconate Cloth  6 each Topical Q0600  . DULoxetine  30 mg Oral Daily  . feeding supplement  1 Container Oral TID BM  . insulin aspart  0-9 Units Subcutaneous TID WC  . insulin glargine  12 Units Subcutaneous QHS  . mupirocin ointment  1 application Nasal BID  . polyethylene glycol  17 g Oral BID  . potassium chloride  20 mEq Oral Daily  . saccharomyces boulardii  250 mg Oral BID  . senna-docusate  1 tablet Oral BID   Continuous Infusions: . sodium chloride 75 mL/hr at 08/10/17 2219  . cefTRIAXone (ROCEPHIN)  IV       LOS: 2 days    Time spent: 35 minutes.     Alba Cory, MD [facility] Pager [phone]  If 7PM-7AM, please contact night-coverage [email or web address] Password [facility] 08/11/2017, 5:49 PM

## 2017-08-11 NOTE — Progress Notes (Signed)
PHARMACY - PHYSICIAN COMMUNICATION CRITICAL VALUE ALERT - BLOOD CULTURE IDENTIFICATION (BCID)  Results for orders placed or performed during the hospital encounter of 08/09/17  Blood Culture ID Panel (Reflexed) (Collected: 08/09/2017  1:24 PM)  Result Value Ref Range   Enterococcus species NOT DETECTED NOT DETECTED   Listeria monocytogenes NOT DETECTED NOT DETECTED   Staphylococcus species NOT DETECTED NOT DETECTED   Staphylococcus aureus NOT DETECTED NOT DETECTED   Streptococcus species NOT DETECTED NOT DETECTED   Streptococcus agalactiae NOT DETECTED NOT DETECTED   Streptococcus pneumoniae NOT DETECTED NOT DETECTED   Streptococcus pyogenes NOT DETECTED NOT DETECTED   Acinetobacter baumannii NOT DETECTED NOT DETECTED   Enterobacteriaceae species NOT DETECTED NOT DETECTED   Enterobacter cloacae complex NOT DETECTED NOT DETECTED   Escherichia coli NOT DETECTED NOT DETECTED   Klebsiella oxytoca NOT DETECTED NOT DETECTED   Klebsiella pneumoniae NOT DETECTED NOT DETECTED   Proteus species NOT DETECTED NOT DETECTED   Serratia marcescens NOT DETECTED NOT DETECTED   Haemophilus influenzae NOT DETECTED NOT DETECTED   Neisseria meningitidis NOT DETECTED NOT DETECTED   Pseudomonas aeruginosa NOT DETECTED NOT DETECTED   Candida albicans NOT DETECTED NOT DETECTED   Candida glabrata NOT DETECTED NOT DETECTED   Candida krusei NOT DETECTED NOT DETECTED   Candida parapsilosis NOT DETECTED NOT DETECTED   Candida tropicalis NOT DETECTED NOT DETECTED    Name of physician (or Provider) Contacted: Dr. Sunnie Nielsen  Changes to prescribed antibiotics required: Continue Ceftriaxone, but increase to 2g IV q24h for bacteremia dosing.  Lynann Beaver PharmD, BCPS Pager [phone] 08/11/2017 3:11 PM

## 2017-08-11 NOTE — Progress Notes (Signed)
Progress Note  Patient Name: Adrienne [name] Date of Encounter: 08/11/2017  Primary Cardiologist: Dr. [place]  Subjective   Pt complains of rectal pain, no chest pain. Pt seems to be in more pain today.  Inpatient Medications    Scheduled Meds: . aspirin EC  81 mg Oral Daily  . bisacodyl  10 mg Rectal Once  . Chlorhexidine Gluconate Cloth  6 each Topical Q0600  . DULoxetine  30 mg Oral Daily  . feeding supplement  1 Container Oral TID BM  . insulin aspart  0-9 Units Subcutaneous TID WC  . insulin glargine  12 Units Subcutaneous QHS  . mupirocin ointment  1 application Nasal BID  . OLANZapine  2.5 mg Oral QHS  . polyethylene glycol  17 g Oral BID  . potassium chloride  20 mEq Oral Daily  . saccharomyces boulardii  250 mg Oral BID  . senna-docusate  1 tablet Oral BID   Continuous Infusions: . sodium chloride 75 mL/hr at 08/10/17 2219  . cefTRIAXone (ROCEPHIN)  IV Stopped (08/10/17 1734)   PRN Meds: acetaminophen **OR** acetaminophen, ALPRAZolam, fentaNYL (SUBLIMAZE) injection, levalbuterol   Vital Signs    Vitals:   08/10/17 0809 08/10/17 1420 08/10/17 2136 08/11/17 0548  BP: (!) 114/52 122/66 (!) 119/48 (!) 135/56  Pulse: 71 77 89 74  Resp: Temp: 97.6 F (36.4 C) 97.6 F (36.4 C) 98.1 F (36.7 C) 98.5 F (36.9 C)  TempSrc: Oral Oral Oral Oral  SpO2: 98% 98% 94% 96%  Weight:      Height:        Intake/Output Summary (Last 24 hours) at 08/11/17 1054 Last data filed at 08/11/17 0630  Gross per 24 hour  Intake          1811.58 ml  Output              300 ml  Net          1511.58 ml   Filed Weights   08/09/17 0851  Weight: 200 lb (90.7 kg)     Physical Exam   General: Well developed, well nourished, female appearing in no acute distress. Head: Normocephalic, atraumatic.  Neck: Supple without bruits, no JVD. Lungs:  Resp regular and unlabored, diminished in bases Heart: RRR, S1, S2, no S3, S4, or murmur; no rub. Abdomen: Soft,  non-tender, non-distended with normoactive bowel sounds. No hepatomegaly. No rebound/guarding. No obvious abdominal masses. Extremities: No clubbing, cyanosis, + edema L > R. Distal pedal pulses are faint bilaterally. Neuro: Alert and oriented X 3. Moves all extremities spontaneously. Psych: Normal affect.  Labs    Chemistry Recent Labs Lab 08/09/17 0422 08/10/17 0141 08/11/17 0940  NA 136 135 136  K 4.1 4.6 4.4  CL 103 108 109  CO2 19* 18* 18*  GLUCOSE 227* 182* 211*  BUN 18 27* 20  CREATININE 1.20* 1.12* 0.80  CALCIUM 9.5 8.2* 8.0*  PROT 8.7* 7.6  --   ALBUMIN 3.2* 3.0*  --   AST 56* 44*  --   ALT 28 23  --   ALKPHOS 62 44  --   BILITOT 0.6 0.5  --   GFRNONAA 42* 45* >60  GFRAA 48* 53* >60  ANIONGAP Hematology Recent Labs Lab 08/09/17 0422 08/10/17 0141 08/11/17 0940  WBC 14.1* 13.5* 8.3  RBC 4.02 3.38* 3.16*  HGB 13.1 10.6* 9.9*  HCT 39.8 33.8* 31.5*  MCV 99.0 100.0 99.7  MCH 32.6 31.4 31.3  MCHC 32.9 31.4 31.4  RDW 16.1* 17.1* 17.1*  PLT 225 204 159    Cardiac Enzymes Recent Labs Lab 08/09/17 0645 08/09/17 1321 08/09/17 1539 08/09/17 2128  TROPONINI 0.46* 0.49* 0.48* 0.52*   No results for input(s): TROPIPOC in the last 168 hours.   BNPNo results for input(s): BNP, PROBNP in the last 168 hours.   DDimer No results for input(s): DDIMER in the last 168 hours.   Radiology    Dg Tibia/fibula Left  Result Date: 08/10/2017 CLINICAL DATA:  Chronic left lower leg pain after fall 2 years ago. EXAM: LEFT TIBIA AND FIBULA - 2 VIEW COMPARISON:  None. FINDINGS: Old healed distal left tibial fracture is noted. Status post surgical fixation of old distal fibular fracture. No acute fracture or dislocation is seen. No soft tissue abnormality is noted. IMPRESSION: No acute abnormality seen in the left tibia or fibula. Electronically Signed   By: Lupita Raider, M.D.   On: 08/10/2017 12:33   Dg Hips Bilat With Pelvis 2v  Result Date:  08/10/2017 CLINICAL DATA:  Chronic bilateral hip pain without recent injury. EXAM: DG HIP (WITH OR WITHOUT PELVIS) 2V BILAT COMPARISON:  None. FINDINGS: There is no evidence of hip fracture or dislocation. There is no evidence of arthropathy or other focal bone abnormality. IMPRESSION: Normal bilateral hips. Electronically Signed   By: Lupita Raider, M.D.   On: 08/10/2017 12:24     Telemetry    Normal sinus - Personally Reviewed  ECG    Sinus  - Personally Reviewed   Cardiac Studies   Echo 08/09/17: Study Conclusions - Left ventricle: Wall thickness was increased in a pattern of   moderate LVH. Systolic function was normal. The estimated   ejection fraction was in the range of 55% to 60%. Doppler   parameters are consistent with abnormal left ventricular   relaxation (grade 1 diastolic dysfunction). - Aortic valve: There was mild stenosis. Valve area (VTI): 2.1   cm^2. Valve area (Vmax): 2.06 cm^2. Valve area (Vmean): 2.32   cm^2. - Aorta: aortic atherosclerosis. - Mitral valve: Severely calcified annulus. Moderately thickened   leaflets . - Left atrium: The atrium was mildly dilated. - Right ventricle: The cavity size was mildly dilated. - Atrial septum: There was increased thickness of the septum,   consistent with lipomatous hypertrophy. No defect or patent   foramen ovale was identified. - Pulmonary arteries: PA peak pressure: 33 mm Hg (S).  Echo 11/03/13: Study Conclusions - Left ventricle: Wall thickness was increased in a pattern of moderate LVH. There was moderate concentric hypertrophy. Systolic function was normal. The estimated ejection fraction was in the range of 55% to 60%. Wall motion was normal; there were no regional wall motion abnormalities. Doppler parameters are consistent with abnormal left ventricular relaxation (grade 1 diastolic dysfunction). - Aortic valve: Valve area: 2.16cm^2 (Vmax). - Left atrium: The atrium was mildly  dilated. Transthoracic echocardiography. M-mode, complete 2D, spectral Doppler, and color Doppler.   Patient Profile     81 y.o. female with a hx of HTN, obesity, DM, dementia and HLD but no known CAD with normal stress test in 2012 now presents to ER form [facility] with complaints of rectal and abd pain. She is also hypotensive. She had decreased oral intake. In ER IV fluids given with improved BP.  Assessment & Plan    1. Elevated troponin - mildly elevated and flat, unlikely related to an ischemic event - EKG is  unchanged - ACS risk factors include DM, HTN, and family history - echo without new WMA or reduced function - heparin drip D/C'ed - continue ASA 81 mg - pt denies chest pain, will consider OP stress test after discharge  2. HTN - home losartan still on hold - sBP 110-130s  3. Prolonged QTc - QTc borderline at 481 ms - consider psych consult for medication management with a medication that can be substituted for zyprexa without prolonging QTc  4. DM - per primary team  5. Chronic diastolic heart failure - euvolemic, echo unchanged  6. Leukocytosis - 8.3 today from 13.5 - per primary team    Signed, Marcelino Duster , PA-C 10:54 AM 08/11/2017 Pager: [phone]  History and all data above reviewed.  Patient examined.  I agree with the findings as above.  She is not having any chest pain or SOB.    The patient exam reveals COR:RRR  ,  Lungs: clear  ,  Abd: Positive bowel sounds, no rebound no guarding, Ext Mild left greater than right leg edema  .  All available labs, radiology testing, previous records reviewed. Agree with documented assessment and plan. Prolonged QT:  QT is improved compared to previous.  I would suggest reconsidering Zyprexa if there is a reasonable alternative that might not affect QT.  Elevated troponin:  No plans for an ischemia work up at this point.    Fayrene Fearing Jereld Presti  1:36 PM  08/11/2017

## 2017-08-11 NOTE — Progress Notes (Signed)
PT Cancellation Note  Patient Details Name: Adrienne [name] MRN: [id number] DOB: [birth year]   Cancelled Treatment:    Reason Eval/Treat Not Completed: PT screened, no needs identified, will sign off Pt is a resident at [facility] and currently not receiving PT per daughter.  Daughter reports pt has dementia, and they assist her with transfers, sometimes using lift, to w/c.  She declines need for acute PT at this time.   [name],[name] 08/11/2017, 2:40 PM [name], PT, DPT 08/11/2017 Pager: [phone]

## 2017-08-12 DIAGNOSIS — Z7189 Other specified counseling: Secondary | ICD-10-CM

## 2017-08-12 DIAGNOSIS — R079 Chest pain, unspecified: Secondary | ICD-10-CM

## 2017-08-12 DIAGNOSIS — Z515 Encounter for palliative care: Secondary | ICD-10-CM

## 2017-08-12 LAB — CBC
HEMATOCRIT: 32 % — AB (ref 36.0–46.0)
HEMOGLOBIN: 9.9 g/dL — AB (ref 12.0–15.0)
MCH: 31.7 pg (ref 26.0–34.0)
MCHC: 30.9 g/dL (ref 30.0–36.0)
MCV: 102.6 fL — ABNORMAL HIGH (ref 78.0–100.0)
Platelets: 146 10*3/uL — ABNORMAL LOW (ref 150–400)
RBC: 3.12 MIL/uL — AB (ref 3.87–5.11)
RDW: 17.4 % — ABNORMAL HIGH (ref 11.5–15.5)
WBC: 8.7 10*3/uL (ref 4.0–10.5)

## 2017-08-12 LAB — BASIC METABOLIC PANEL
Anion gap: 7 (ref 5–15)
BUN: 13 mg/dL (ref 6–20)
CHLORIDE: 110 mmol/L (ref 101–111)
CO2: 19 mmol/L — AB (ref 22–32)
CREATININE: 0.56 mg/dL (ref 0.44–1.00)
Calcium: 7.9 mg/dL — ABNORMAL LOW (ref 8.9–10.3)
Glucose, Bld: 116 mg/dL — ABNORMAL HIGH (ref 65–99)
POTASSIUM: 4.2 mmol/L (ref 3.5–5.1)
SODIUM: 136 mmol/L (ref 135–145)

## 2017-08-12 LAB — GLUCOSE, CAPILLARY
GLUCOSE-CAPILLARY: 123 mg/dL — AB (ref 65–99)
GLUCOSE-CAPILLARY: 98 mg/dL (ref 65–99)
Glucose-Capillary: 138 mg/dL — ABNORMAL HIGH (ref 65–99)
Glucose-Capillary: 101 mg/dL — ABNORMAL HIGH (ref 65–99)

## 2017-08-12 LAB — URINE CULTURE

## 2017-08-12 MED ORDER — ARIPIPRAZOLE 2 MG PO TABS
2.0000 mg | ORAL_TABLET | Freq: Every day | ORAL | Status: DC
  Administered 2017-08-12 – 2017-08-14 (×3): 2 mg via ORAL
  Filled 2017-08-12 (×5): qty 1

## 2017-08-12 NOTE — Progress Notes (Deleted)
Progress Note  Patient Name: Adrienne [name] Date of Encounter: 08/12/2017  Primary Cardiologist: Dr. [place]  Subjective   Pt denies chest pain  Inpatient Medications    Scheduled Meds: . ARIPiprazole  2 mg Oral QHS  . aspirin EC  81 mg Oral Daily  . Chlorhexidine Gluconate Cloth  6 each Topical Q0600  . DULoxetine  30 mg Oral Daily  . feeding supplement  1 Container Oral TID BM  . insulin aspart  0-9 Units Subcutaneous TID WC  . insulin glargine  12 Units Subcutaneous QHS  . mupirocin ointment  1 application Nasal BID  . polyethylene glycol  17 g Oral BID  . potassium chloride  20 mEq Oral Daily  . saccharomyces boulardii  250 mg Oral BID  . senna-docusate  1 tablet Oral BID   Continuous Infusions: . sodium chloride 75 mL/hr at 08/10/17 2219  . cefTRIAXone (ROCEPHIN)  IV Stopped (08/11/17 1832)   PRN Meds: acetaminophen **OR** acetaminophen, ALPRAZolam, fentaNYL (SUBLIMAZE) injection, levalbuterol   Vital Signs    Vitals:   08/11/17 1301 08/11/17 2325 08/12/17 0517 08/12/17 0627  BP:  (!) 143/78 103/66   Pulse:  84 83   Resp:  18 18   Temp:  97.9 F (36.6 C) 98 F (36.7 C)   TempSrc: Oral Oral Oral   SpO2:  97% 96% 98%  Weight:      Height:        Intake/Output Summary (Last 24 hours) at 08/12/17 1134 Last data filed at 08/12/17 0200  Gross per 24 hour  Intake           1782.5 ml  Output             2000 ml  Net           -217.5 ml   Filed Weights   08/09/17 0851  Weight: 200 lb (90.7 kg)     Physical Exam   General: Well developed, well nourished, female appearing in no acute distress. Head: Normocephalic, atraumatic.  Neck: Supple without bruits, no JVD Lungs:  Resp regular and unlabored, CTA. Heart: RRR, S1, S2, no murmur; no rub. Abdomen: Soft, non-tender, non-distended with normoactive bowel sounds. No hepatomegaly. No rebound/guarding. No obvious abdominal masses. Extremities: No clubbing, cyanosis, 1+ edema. Distal pedal pulses are 2+  bilaterally. Neuro: Alert and oriented X 3. Moves all extremities spontaneously. Psych: Normal affect.  Labs    Chemistry Recent Labs Lab 08/09/17 0422 08/10/17 0141 08/11/17 0940 08/12/17 0419  NA 136 135 136 136  K 4.1 4.6 4.4 4.2  CL 103 108 109 110  CO2 19* 18* 18* 19*  GLUCOSE 227* 182* 211* 116*  BUN 18 27* 20 13  CREATININE 1.20* 1.12* 0.80 0.56  CALCIUM 9.5 8.2* 8.0* 7.9*  PROT 8.7* 7.6  --   --   ALBUMIN 3.2* 3.0*  --   --   AST 56* 44*  --   --   ALT 28 23  --   --   ALKPHOS 62 44  --   --   BILITOT 0.6 0.5  --   --   GFRNONAA 42* 45* >60 >60  GFRAA 48* 53* >60 >60  ANIONGAP Hematology Recent Labs Lab 08/10/17 0141 08/11/17 0940 08/12/17 0419  WBC 13.5* 8.3 8.7  RBC 3.38* 3.16* 3.12*  HGB 10.6* 9.9* 9.9*  HCT 33.8* 31.5* 32.0*  MCV 100.0 99.7 102.6*  MCH 31.4 31.3 31.7  MCHC 31.4 31.4 30.9  RDW 17.1* 17.1* 17.4*  PLT 204 159 146*    Cardiac Enzymes Recent Labs Lab 08/09/17 0645 08/09/17 1321 08/09/17 1539 08/09/17 2128  TROPONINI 0.46* 0.49* 0.48* 0.52*   No results for input(s): TROPIPOC in the last 168 hours.   BNPNo results for input(s): BNP, PROBNP in the last 168 hours.   DDimer No results for input(s): DDIMER in the last 168 hours.   Radiology    Dg Tibia/fibula Left  Result Date: 08/10/2017 CLINICAL DATA:  Chronic left lower leg pain after fall 2 years ago. EXAM: LEFT TIBIA AND FIBULA - 2 VIEW COMPARISON:  None. FINDINGS: Old healed distal left tibial fracture is noted. Status post surgical fixation of old distal fibular fracture. No acute fracture or dislocation is seen. No soft tissue abnormality is noted. IMPRESSION: No acute abnormality seen in the left tibia or fibula. Electronically Signed   By: Lupita Raider, M.D.   On: 08/10/2017 12:33   Dg Hips Bilat With Pelvis 2v  Result Date: 08/10/2017 CLINICAL DATA:  Chronic bilateral hip pain without recent injury. EXAM: DG HIP (WITH OR WITHOUT PELVIS) 2V BILAT  COMPARISON:  None. FINDINGS: There is no evidence of hip fracture or dislocation. There is no evidence of arthropathy or other focal bone abnormality. IMPRESSION: Normal bilateral hips. Electronically Signed   By: Lupita Raider, M.D.   On: 08/10/2017 12:24     Telemetry    Sinus  - Personally Reviewed  ECG    No new tracings yet today - Personally Reviewed   Cardiac Studies   Echo 08/09/17: Study Conclusions - Left ventricle: Wall thickness was increased in a pattern of moderate LVH. Systolic function was normal. The estimated ejection fraction was in the range of 55% to 60%. Doppler parameters are consistent with abnormal left ventricular relaxation (grade 1 diastolic dysfunction). - Aortic valve: There was mild stenosis. Valve area (VTI): 2.1 cm^2. Valve area (Vmax): 2.06 cm^2. Valve area (Vmean): 2.32 cm^2. - Aorta: aortic atherosclerosis. - Mitral valve: Severely calcified annulus. Moderately thickened leaflets . - Left atrium: The atrium was mildly dilated. - Right ventricle: The cavity size was mildly dilated. - Atrial septum: There was increased thickness of the septum, consistent with lipomatous hypertrophy. No defect or patent foramen ovale was identified. - Pulmonary arteries: PA peak pressure: 33 mm Hg (S).  Echo 11/03/13: Study Conclusions - Left ventricle: Wall thickness was increased in a pattern of moderate LVH. There was moderate concentric hypertrophy. Systolic function was normal. The estimated ejection fraction was in the range of 55% to 60%. Wall motion was normal; there were no regional wall motion abnormalities. Doppler parameters are consistent with abnormal left ventricular relaxation (grade 1 diastolic dysfunction). - Aortic valve: Valve area: 2.16cm^2 (Vmax). - Left atrium: The atrium was mildly dilated. Transthoracic echocardiography. M-mode, complete 2D, spectral Doppler, and color Doppler.    Patient  Profile     81 y.o. female with a hx of HTN, obesity, DM, dementia and HLD but no knownCAD with normal stress test in 2012 now presents to ER form [facility] with complaints of rectal and abd pain. She is also hypotensive. She had decreased oral intake. In ER IV fluids given with improved BP.  Assessment & Plan    1. Elevated troponin - Mildly elevated in flat, unlikely related to an ischemic event  - EKG unchanged  - ACS risk factors include DM, hypertension, family history  - Heparin drip DC'd  - Continue aspirin  81 mg  - Patient continues to deny chest pain, we'll consider outpatient stress test after discharge   2. Hypertension - Home losartan on hold - Systolic BP 110s to 140s - continue to monitor  3. Prolonged QTc, psychiatry consulted -  Repeat EKG pending today  - yesterday QTc was borderline prolonged at 481 ms  - Psychiatry consulted, recommends replacing Zyprexa with Abilify 15 mg daily for mood stability and hallucinations - Repeat EKG tomorrow for QTc - Continue Cymbalta 30 mg daily  4. DM - per primary team  5. Chronic diastolic heart failure - Euvolemic, echo unchanged  6. Leukocytosis - resolved, per primary team   Signed, Marcelino Duster , PA-C 11:34 AM 08/12/2017 Pager: [phone]

## 2017-08-12 NOTE — Consult Note (Signed)
[name] Face-to-Face Psychiatry Consult   Reason for Consult:  Prolonged QTC and need antipsychotic managed Referring Physician:  Dr. Tyrell Antonio Patient Identification: Adrienne [name] MRN:  [id number] Principal Diagnosis: ACS (acute coronary syndrome) [facility]) Diagnosis:   Patient Active Problem List   Diagnosis Date Noted  . ACS (acute coronary syndrome) ([place]) [I24.9] 08/09/2017  . Chest pain [R07.9] 08/09/2017  . Goals of care, counseling/discussion [Z71.89]   . Palliative care encounter [Z51.5]   . Postoperative ileus ([place]) [K91.89, K56.7] 12/24/2016  . Strangulation of intestine ([place]) [K56.2] 12/22/2016  . Leukocytosis [D72.829]   . Aspiration pneumonia of left lung ([place]) [J69.0]   . DNR (do not resuscitate) discussion [Z71.89] 12/19/2016  . Palliative care by specialist [Z51.5] 12/19/2016  . Abdominal distention [R14.0]   . Oral phase dysphagia [R13.11]   . Pressure injury of skin [L89.90] 12/11/2016  . Ventilator dependent ([place]) [Z99.11]   . SBO (small bowel obstruction) ([place]) [K56.609] 12/08/2016  . Acute on chronic respiratory failure with hypoxemia (H[facility]) [J96.21]   . Acute renal failure ([place]) [N17.9]   . Elevated lactic acid level [R79.89]   . Metabolic acidosis [K56.2]   . Ileus ([place]) [K56.7]   . Pneumonia [J18.9] 11/02/2013  . Syncope [R55] 11/02/2013  . Falls frequently [R29.6] 11/02/2013  . Anxiety state, unspecified [F41.1] 11/02/2013  . Depression [F32.9] 11/02/2013  . Unspecified hypothyroidism [E03.9] 11/02/2013  . Essential hypertension [I10] 11/02/2013  . HLD (hyperlipidemia) [E78.5] 11/02/2013  . Diabetes ([place]) [E11.9] 11/02/2013  . Dementia with behavioral disturbance [F03.91] 11/02/2013  . Chronic pain syndrome [G89.4] 11/02/2013  . Headache(784.0) [R51] 10/23/2013  . Postop Hypokalemia [E87.6] 01/15/2013  . Postoperative anemia due to acute blood loss [D62] 01/15/2013  . Postop Transfusion [Z92.89] 01/15/2013  . OA (osteoarthritis) of knee [M17.10]  04/21/2012  . Pre-operative clearance [Z01.818] 02/08/2012  . OSA (obstructive sleep apnea) [G47.33] 05/03/2011    Total Time spent with patient: 40 minutes  Subjective:   Adrienne [name] is a 81 y.o. female patient admitted with history of anxiety, depression, diabetes, hypertension, sleep apnea, morbid obesity, resident of [place], who presented to the ED because of low blood pressure. Patient is a poor historian and unable to answer almost all questions. She reports that her husband is at home and he assists her with her medications and informs me that I can call him or [facility] because that is where she gets her medications. She denies SI/HI/AVH and depression.  [facility] was contacted and provider Suzzanne Cloud contacted me back. Patient's husband is dead. The patient is reported to have active hallucinations and delusional thoughts related to dementia. The Cymbalta had been increase to 60 mg in the past, but was recommended to reduce by pharmacy and the patient has been stable with depression since it was decreased. The Zyprexa has tapered off in the past and the patient's hallucinations and delusional thoughts increased, so the Zyprexa was restarted.   Objective: Patient's chart and findings reviewed with treatment team. Patient is pleasant and cooperative, but very confused and very poor historian. Patient does not appear depressed and has an appropriate affect and mood. Due to all antipsychotics causing Qt and Qtc changes will trial a low dose of Abilify and will request a follow up EKG the following day. The Cymbalta can be continued at current dose and only titrated up if worsening depression.  HPI:   81 year old female with a history of anxiety, depression, diabetes, hypertension, sleep apnea, morbid obesity, resident of [place], who presented to  the ED because of low blood pressure. Patient has underlying dementia and is a very poor historian and patient's daughter who is  by the bedside is unable to provide any meaningful history. According to the chart patient was brought in  early this morning due to low blood pressure, complains of upper abdominal pain, rectal pain. Patient's initial blood pressure was 86/ 36. CBG was 260. Reportedly has also had decreased oral intake, patient appears to be dehydrated clinically on exam.  After receiving about 2 L of fluid patient's blood pressure has improved. Workup reveals glucose of 227, bicarbonate of 19, troponin 0.46, white blood cell count 14.1, UA negative, chest x-ray shows mild cardiomegaly with central bony vascular prominence without overt CHF, CT abdomen and pelvis shows cholelithiasis without inflammation. Patient was brought from [place] where she is a resident.  Past Psychiatric History: Depression, Dementia, Hallucinations  Risk to Self: Is patient at risk for suicide?: No Risk to Others:   Prior Inpatient Therapy:   Prior Outpatient Therapy:    Past Medical History:  Past Medical History:  Diagnosis Date  . Anxiety   . Depression   . Diabetes mellitus    ORAL MED  . Hypercholesterolemia   . Hypertension   . Hypothyroidism    STATES SHE NO LONGER NEEDS THYROID SUPPLEMENT  . Normal nuclear stress test Jan 2012   No ischemia. EF 81%  . Obesity   . Osteoarthritis (arthritis due to wear and tear of joints)    PAIN AND OA BOTH KNEES  . Osteopenia   . Sleep apnea    DOES NOT USE CPAP-UNABLE TO TOLERATE    Past Surgical History:  Procedure Laterality Date  . ABDOMINAL HYSTERECTOMY    . BILATERAL OOPHORECTOMY    . CARDIAC CATHETERIZATION  02/03/1999   EF 70%  . CARDIOVASCULAR STRESS TEST  11/30/2010   EF 81%, NORMAL  . LAPAROTOMY N/A 12/22/2016   Procedure: EXPLORATORY LAPAROTOMY;  Surgeon: Erroll Luna, MD;  Location: [place];  Service: General;  Laterality: N/A;  . LYSIS OF ADHESION N/A 12/22/2016   Procedure: LYSIS OF ADHESION;  Surgeon: Erroll Luna, MD;  Location: [place];  Service: General;   Laterality: N/A;  . ORIF ANKLE FRACTURE  2004  . THYROIDECTOMY, PARTIAL    . TOTAL KNEE ARTHROPLASTY  04/21/2012   Procedure: TOTAL KNEE ARTHROPLASTY;  Surgeon: Gearlean Alf, MD;  Location: [facility];  Service: Orthopedics;  Laterality: Right;  . TOTAL KNEE ARTHROPLASTY Left 01/12/2013   Procedure: TOTAL KNEE ARTHROPLASTY;  Surgeon: Gearlean Alf, MD;  Location: [facility];  Service: Orthopedics;  Laterality: Left;  . UMBILICAL HERNIA REPAIR N/A 12/22/2016   Procedure: HERNIA REPAIR UMBILICAL ADULT;  Surgeon: Erroll Luna, MD;  Location: [place];  Service: General;  Laterality: N/A;  . US ECHOCARDIOGRAPHY  08/21/2004   EF 60-65%  . VESICOVAGINAL FISTULA CLOSURE W/ TAH     Family History:  Family History  Problem Relation Age of Onset  . Heart disease Father   . Hypertension Father   . Heart attack Father   . Colon cancer Mother   . Heart attack Mother   . Hypertension Mother   . Breast cancer Sister    Family Psychiatric  History: Unknown Social History:  History  Alcohol Use No     History  Drug Use No    Social History   Social History  . Marital status: Married    Spouse name: N/A  . Number of children: N/A  .  Years of education: N/A   Occupational History  . clerical     retired   Social History Main Topics  . Smoking status: Never Smoker  . Smokeless tobacco: Never Used  . Alcohol use No  . Drug use: No  . Sexual activity: No   Other Topics Concern  . None   Social History Narrative  . None   Additional Social History:    Allergies:   Allergies  Allergen Reactions  . Prednisone Shortness Of Breath  . Ibuprofen Other (See Comments)    Swelling mouth and lips  . Meclizine Other (See Comments)    HALLUCINATIONS  . Sulfonamide Derivatives     REACTION: Reaction not known    Labs:  Results for orders placed or performed during the hospital encounter of 08/09/17 (from the past 48 hour(s))  Heparin level (unfractionated)     Status: Abnormal    Collection Time: 08/10/17  1:34 PM  Result Value Ref Range   Heparin Unfractionated <0.10 (L) 0.30 - 0.70 IU/mL    Comment:        IF HEPARIN RESULTS ARE BELOW EXPECTED VALUES, AND PATIENT DOSAGE HAS BEEN CONFIRMED, SUGGEST FOLLOW UP TESTING OF ANTITHROMBIN III LEVELS.   Glucose, capillary     Status: Abnormal   Collection Time: 08/10/17  1:57 PM  Result Value Ref Range   Glucose-Capillary 217 (H) 65 - 99 mg/dL  Glucose, capillary     Status: Abnormal   Collection Time: 08/10/17  4:45 PM  Result Value Ref Range   Glucose-Capillary 152 (H) 65 - 99 mg/dL  Glucose, capillary     Status: Abnormal   Collection Time: 08/10/17 10:18 PM  Result Value Ref Range   Glucose-Capillary 148 (H) 65 - 99 mg/dL  Magnesium     Status: None   Collection Time: 08/11/17  5:16 AM  Result Value Ref Range   Magnesium 2.1 1.7 - 2.4 mg/dL  Glucose, capillary     Status: Abnormal   Collection Time: 08/11/17  8:09 AM  Result Value Ref Range   Glucose-Capillary 148 (H) 65 - 99 mg/dL  CBC     Status: Abnormal   Collection Time: 08/11/17  9:40 AM  Result Value Ref Range   WBC 8.3 4.0 - 10.5 K/uL   RBC 3.16 (L) 3.87 - 5.11 MIL/uL   Hemoglobin 9.9 (L) 12.0 - 15.0 g/dL   HCT 31.5 (L) 36.0 - 46.0 %   MCV 99.7 78.0 - 100.0 fL   MCH 31.3 26.0 - 34.0 pg   MCHC 31.4 30.0 - 36.0 g/dL   RDW 17.1 (H) 11.5 - 15.5 %   Platelets 159 150 - 400 K/uL  Basic metabolic panel     Status: Abnormal   Collection Time: 08/11/17  9:40 AM  Result Value Ref Range   Sodium 136 135 - 145 mmol/L   Potassium 4.4 3.5 - 5.1 mmol/L   Chloride 109 101 - 111 mmol/L   CO2 18 (L) 22 - 32 mmol/L   Glucose, Bld 211 (H) 65 - 99 mg/dL   BUN 20 6 - 20 mg/dL   Creatinine, Ser 0.80 0.44 - 1.00 mg/dL   Calcium 8.0 (L) 8.9 - 10.3 mg/dL   GFR calc non Af Amer >60 >60 mL/min   GFR calc Af Amer >60 >60 mL/min    Comment: (NOTE) The eGFR has been calculated using the CKD EPI equation. This calculation has not been validated in all clinical  situations. eGFR's persistently <60 mL/min  signify possible Chronic Kidney Disease.    Anion gap 9 5 - 15  Glucose, capillary     Status: Abnormal   Collection Time: 08/11/17 12:18 PM  Result Value Ref Range   Glucose-Capillary 147 (H) 65 - 99 mg/dL  Glucose, capillary     Status: Abnormal   Collection Time: 08/11/17  5:46 PM  Result Value Ref Range   Glucose-Capillary 145 (H) 65 - 99 mg/dL  Glucose, capillary     Status: Abnormal   Collection Time: 08/11/17 11:18 PM  Result Value Ref Range   Glucose-Capillary 125 (H) 65 - 99 mg/dL  CBC     Status: Abnormal   Collection Time: 08/12/17  4:19 AM  Result Value Ref Range   WBC 8.7 4.0 - 10.5 K/uL   RBC 3.12 (L) 3.87 - 5.11 MIL/uL   Hemoglobin 9.9 (L) 12.0 - 15.0 g/dL   HCT 32.0 (L) 36.0 - 46.0 %   MCV 102.6 (H) 78.0 - 100.0 fL   MCH 31.7 26.0 - 34.0 pg   MCHC 30.9 30.0 - 36.0 g/dL   RDW 17.4 (H) 11.5 - 15.5 %   Platelets 146 (L) 150 - 400 K/uL  Basic metabolic panel     Status: Abnormal   Collection Time: 08/12/17  4:19 AM  Result Value Ref Range   Sodium 136 135 - 145 mmol/L   Potassium 4.2 3.5 - 5.1 mmol/L   Chloride 110 101 - 111 mmol/L   CO2 19 (L) 22 - 32 mmol/L   Glucose, Bld 116 (H) 65 - 99 mg/dL   BUN 13 6 - 20 mg/dL   Creatinine, Ser 0.56 0.44 - 1.00 mg/dL   Calcium 7.9 (L) 8.9 - 10.3 mg/dL   GFR calc non Af Amer >60 >60 mL/min   GFR calc Af Amer >60 >60 mL/min    Comment: (NOTE) The eGFR has been calculated using the CKD EPI equation. This calculation has not been validated in all clinical situations. eGFR's persistently <60 mL/min signify possible Chronic Kidney Disease.    Anion gap 7 5 - 15  Glucose, capillary     Status: Abnormal   Collection Time: 08/12/17  7:57 AM  Result Value Ref Range   Glucose-Capillary 123 (H) 65 - 99 mg/dL    Current Facility-Administered Medications  Medication Dose Route Frequency Provider Last Rate Last Dose  . 0.9 %  sodium chloride infusion   Intravenous Continuous  Reyne Dumas, MD 75 mL/hr at 08/10/17 2219    . acetaminophen (TYLENOL) tablet 650 mg  650 mg Oral Q6H PRN Reyne Dumas, MD   650 mg at 08/12/17 6045   Or  . acetaminophen (TYLENOL) suppository 650 mg  650 mg Rectal Q6H PRN Reyne Dumas, MD      . ALPRAZolam Duanne Moron) tablet 0.25 mg  0.25 mg Oral QHS PRN Reyne Dumas, MD   0.25 mg at 08/10/17 2211  . aspirin EC tablet 81 mg  81 mg Oral Daily Reyne Dumas, MD   81 mg at 08/12/17 0911  . cefTRIAXone (ROCEPHIN) 2 g in dextrose 5 % 50 mL IVPB  2 g Intravenous Q24H Regalado, Belkys A, MD   Stopped at 08/11/17 1832  . Chlorhexidine Gluconate Cloth 2 % PADS 6 each  6 each Topical Q0600 Regalado, Belkys A, MD   6 each at 08/12/17 0513  . DULoxetine (CYMBALTA) DR capsule 30 mg  30 mg Oral Daily Reyne Dumas, MD   30 mg at 08/12/17 0911  . feeding supplement (  BOOST / RESOURCE BREEZE) liquid 1 Container  1 Container Oral TID BM Reyne Dumas, MD   1 Container at 08/11/17 2000  . fentaNYL (SUBLIMAZE) injection 25 mcg  25 mcg Intravenous Q2H PRN Reyne Dumas, MD   25 mcg at 08/10/17 0641  . insulin aspart (novoLOG) injection 0-9 Units  0-9 Units Subcutaneous TID WC Reyne Dumas, MD   1 Units at 08/12/17 0841  . insulin glargine (LANTUS) injection 12 Units  12 Units Subcutaneous QHS Reyne Dumas, MD   12 Units at 08/11/17 2317  . levalbuterol (XOPENEX) nebulizer solution 0.63 mg  0.63 mg Nebulization Q6H PRN Reyne Dumas, MD   0.63 mg at 08/12/17 0627  . mupirocin ointment (BACTROBAN) 2 % 1 application  1 application Nasal BID Regalado, Belkys A, MD   1 application at 38/93/73 0911  . polyethylene glycol (MIRALAX / GLYCOLAX) packet 17 g  17 g Oral BID Regalado, Belkys A, MD   17 g at 08/12/17 0911  . potassium chloride 20 MEQ/15ML (10%) solution 20 mEq  20 mEq Oral Daily Reyne Dumas, MD   20 mEq at 08/12/17 0911  . saccharomyces boulardii (FLORASTOR) capsule 250 mg  250 mg Oral BID Reyne Dumas, MD   250 mg at 08/12/17 0911  . senna-docusate  (Senokot-S) tablet 1 tablet  1 tablet Oral BID Regalado, Belkys A, MD   1 tablet at 08/12/17 0911    Musculoskeletal: Strength & Muscle Tone: decreased Gait & Station: unsteady Patient leans: N/A  Psychiatric Specialty Exam: Physical Exam  ROS  Blood pressure 103/66, pulse 83, temperature 98 F (36.7 C), temperature source Oral, resp. rate 18, height 5' 4"  (1.626 m), weight 90.7 kg (200 lb), SpO2 98 %.Body mass index is 34.33 kg/m.  General Appearance: Casual  Eye Contact:  Good  Speech:  Clear and Coherent and Normal Rate  Volume:  Normal  Mood:  Euthymic  Affect:  Appropriate  Thought Process:  Answers questions appropriately, but very confused   Orientation:  Other:  to self  Thought Content:  Delusions  Suicidal Thoughts:  No  Homicidal Thoughts:  No  Memory:  Immediate;   Poor Recent;   Poor  Judgement:  Impaired  Insight:  Lacking  Psychomotor Activity:  Normal  Concentration:  Concentration: Fair and Attention Span: Fair  Recall:  Poor  Fund of Knowledge:  Fair  Language:  Fair  Akathisia:  No  Handed:  Right  AIMS (if indicated):     Assets:  Paramedic  ADL's:  Impaired  Cognition:  Impaired,  Moderate  Sleep:        Treatment Plan Summary: Daily contact with patient to assess and evaluate symptoms and progress in treatment and Medication management  Recommendations:  -Continue Cymbalta 30 mg PO Daily for depression  -Start Abilify 2 mg PO QHS for mood stability and hallucinations  -EKG following day after starting Abilify  -Psych will continue to follow for medication management  Disposition: No evidence of imminent risk to self or others at present.   Patient does not meet criteria for psychiatric inpatient admission.  [place], FNP 08/12/2017 10:21 AM

## 2017-08-12 NOTE — Consult Note (Signed)
WOC Nurse wound consult note Reason for Consult: Consult requested for buttocks and sacrum.  Pt states she is frequently incontinent of urine prior to admission and developed "a raw place which itches." Pt has a purewick in place to reduce soiling to the location from urine. Wound type:  9X9cm dry red patchy area of moisture associated skin damage to bilat buttocks which is beginning to scab.  In the center, over the sacrum bone area, there is a stage 3 pressure injury, 1X.3X.3cm, pale red wound bed, small amt tan drainage. Pressure Injury POA: Yes Dressing procedure/placement/frequency: Foam dressing to buttocks to protect and promote healing.  Aquacel to promote healing over sacrum wound.  Please re-consult if further assistance is needed.  Thank-you,  Cammie Mcgee MSN, RN, CWOCN, [place], CNS [phone]

## 2017-08-12 NOTE — Progress Notes (Addendum)
PROGRESS NOTE    Adrienne [name]  ZOX:[id number] DOB: [birth year] DOA: 08/09/2017 PCP: Merri Brunette, MD    Brief Narrative:  81 year old female with a history of anxiety, depression, diabetes, hypertension, sleep apnea, morbid obesity, resident of [name], who presented to the ED because of low blood pressure. Patient has underlying dementia and is a very poor historian and patient's daughter who is by the bedside is unable to provide any meaningful history. According to the chart patient was brought in  early this morning due to low blood pressure, complains of upper abdominal pain, rectal pain. Patient's initial blood pressure was 86/ 36. CBG was 260. Reportedly has also had decreased oral intake, patient appears to be dehydrated clinically on exam.  After receiving about 2 L of fluid patient's blood pressure has improved. Workup reveals glucose of 227, bicarbonate of 19, troponin 0.46, white blood cell count 14.1, UA negative, chest x-ray shows mild cardiomegaly with central bony vascular prominence without overt CHF, CT abdomen and pelvis shows cholelithiasis without inflammation Patient admitted for possible ACS, low blood pressure, cardiology has been consulted   Assessment & Plan:   Principal Problem:   ACS (acute coronary syndrome) (HCC) Active Problems:   OSA (obstructive sleep apnea)   Depression   Essential hypertension   Diabetes (HCC)   Chronic pain syndrome   Acute on chronic respiratory failure with hypoxemia (HCC)   Chest pain   Elevated troponin;  Per cardiology less likely ischemic event.  ECHO no wall motion abnormalities. Heparin discontinue.  EKG; prolong QT.  Stress test out patient   Hypotension; resolved.  UA, chest x ray negative.  Related to dehydration. NSL  Leukocytosis; one of 2 blood culture growing gram positive rods.  Continue with ceftriaxone.  FU urine culture. Multiples species.  Chest x ray negative for PNA.  Follow Blood culture.    Left Lower extremity edema, hip pain.  Check x ray. Report history of fall at home.  Doppler negative.  X ray negative for fracture.    DM; lantus, SSI.   Abdominal pain-CT abdomen pelvis shows cirrhosis of the liver, otherwise no acute pathology No BM. Laxative ordered, suppository  Had good BM per nurse report.   Mild dementia ; hold Namenda, Zyprexa due to prolong QT. Psych consulted.   Hypomagnesemia replace,.   Depression-continue Xanax and Cymbalta  Redness; decubitus sacrum  present on admission. Local care. Wound care consult      DVT prophylaxis: on heparin  Code Status: full code.  Family Communication: no family at bedside.  Disposition Plan: to be determine.    Consultants:   Cardiology    Procedures:   ECHO    Antimicrobials:   Subjective: Oral intake mildly improved.  Confuse.  denies abdominal pain     Objective: Vitals:   08/11/17 2325 08/12/17 0517 08/12/17 0627 08/12/17 1319  BP: (!) 143/78 103/66  (!) 146/69  Pulse: 84 83  83  Resp: Temp: 97.9 F (36.6 C) 98 F (36.7 C)  98 F (36.7 C)  TempSrc: Oral Oral  Oral  SpO2: 97% 96% 98% 98%  Weight:      Height:        Intake/Output Summary (Last 24 hours) at 08/12/17 1446 Last data filed at 08/12/17 1320  Gross per 24 hour  Intake           1782.5 ml  Output             2700 ml  Net           -917.5 ml   Filed Weights   08/09/17 0851  Weight: 90.7 kg (200 lb)    Examination:  General exam: NAD Respiratory system: CTA Cardiovascular system:  S 1, S 2 RRR Gastrointestinal system: Abdomen soft, nt  Central nervous system: non focal.  Extremities: symmetric power.      Data Reviewed: I have personally reviewed following labs and imaging studies  CBC:  Recent Labs Lab 08/09/17 0422 08/10/17 0141 08/11/17 0940 08/12/17 0419  WBC 14.1* 13.5* 8.3 8.7  NEUTROABS 11.1*  --   --   --   HGB 13.1 10.6* 9.9* 9.9*  HCT 39.8 33.8* 31.5* 32.0*  MCV  99.0 100.0 99.7 102.6*  PLT 225 204 159 146*   Basic Metabolic Panel:  Recent Labs Lab 08/09/17 0422 08/09/17 1321 08/10/17 0141 08/11/17 0516 08/11/17 0940 08/12/17 0419  NA 136  --  135  --  136 136  K 4.1  --  4.6  --  4.4 4.2  CL 103  --  108  --  109 110  CO2 19*  --  18*  --  18* 19*  GLUCOSE 227*  --  182*  --  211* 116*  BUN 18  --  27*  --  20 13  CREATININE 1.20*  --  1.12*  --  0.80 0.56  CALCIUM 9.5  --  8.2*  --  8.0* 7.9*  MG  --  1.5*  --  2.1  --   --    GFR: Estimated Creatinine Clearance: 62.2 mL/min (by C-G formula based on SCr of 0.56 mg/dL). Liver Function Tests:  Recent Labs Lab 08/09/17 0422 08/10/17 0141  AST 56* 44*  ALT 28 23  ALKPHOS 62 44  BILITOT 0.6 0.5  PROT 8.7* 7.6  ALBUMIN 3.2* 3.0*   No results for input(s): LIPASE, AMYLASE in the last 168 hours. No results for input(s): AMMONIA in the last 168 hours. Coagulation Profile: No results for input(s): INR, PROTIME in the last 168 hours. Cardiac Enzymes:  Recent Labs Lab 08/09/17 0422 08/09/17 0645 08/09/17 1321 08/09/17 1539 08/09/17 2128  TROPONINI 0.46* 0.46* 0.49* 0.48* 0.52*   BNP (last 3 results) No results for input(s): PROBNP in the last 8760 hours. HbA1C: No results for input(s): HGBA1C in the last 72 hours. CBG:  Recent Labs Lab 08/11/17 1218 08/11/17 1746 08/11/17 2318 08/12/17 0757 08/12/17 1201  GLUCAP 147* 145* 125* 123* 98   Lipid Profile: No results for input(s): CHOL, HDL, LDLCALC, TRIG, CHOLHDL, LDLDIRECT in the last 72 hours. Thyroid Function Tests: No results for input(s): TSH, T4TOTAL, FREET4, T3FREE, THYROIDAB in the last 72 hours. Anemia Panel: No results for input(s): VITAMINB12, FOLATE, FERRITIN, TIBC, IRON, RETICCTPCT in the last 72 hours. Sepsis Labs:  Recent Labs Lab 08/09/17 0422 08/09/17 0850  LATICACIDVEN 4.7* 3.2*    Recent Results (from the past 240 hour(s))  Urine Culture     Status: Abnormal   Collection Time: 08/09/17   7:54 AM  Result Value Ref Range Status   Specimen Description URINE, RANDOM  Final   Special Requests NONE  Final   Culture MULTIPLE SPECIES PRESENT, SUGGEST RECOLLECTION (A)  Final   Report Status 08/12/2017 FINAL  Final  Culture, blood (Routine X 2) w Reflex to ID Panel     Status: None (Preliminary result)   Collection Time: 08/09/17  1:18 PM  Result Value Ref Range Status   Specimen Description BLOOD  RIGHT ARM  Final   Special Requests IN PEDIATRIC BOTTLE Blood Culture adequate volume  Final   Culture   Final    NO GROWTH 3 DAYS Performed at [facility], [address] [place], [place] [address]    Report Status PENDING  Incomplete  Culture, blood (Routine X 2) w Reflex to ID Panel     Status: None (Preliminary result)   Collection Time: 08/09/17  1:24 PM  Result Value Ref Range Status   Specimen Description BLOOD RIGHT ARM  Final   Special Requests IN PEDIATRIC BOTTLE Blood Culture adequate volume  Final   Culture  Setup Time   Final    GRAM POSITIVE RODS IN PEDIATRIC BOTTLE CRITICAL RESULT CALLED TO, READ BACK BY AND VERIFIED WITH: C. Shade Pharm.D. 15:10 08/11/17 ([email or web address])    Culture   Final    GRAM POSITIVE RODS IDENTIFICATION TO FOLLOW Performed at [facility], [address], [place], [place] [address]    Report Status PENDING  Incomplete  Blood Culture ID Panel (Reflexed)     Status: None   Collection Time: 08/09/17  1:24 PM  Result Value Ref Range Status   Enterococcus species NOT DETECTED NOT DETECTED Final   Listeria monocytogenes NOT DETECTED NOT DETECTED Final   Staphylococcus species NOT DETECTED NOT DETECTED Final   Staphylococcus aureus NOT DETECTED NOT DETECTED Final   Streptococcus species NOT DETECTED NOT DETECTED Final   Streptococcus agalactiae NOT DETECTED NOT DETECTED Final   Streptococcus pneumoniae NOT DETECTED NOT DETECTED Final   Streptococcus pyogenes NOT DETECTED NOT DETECTED Final   Acinetobacter baumannii NOT DETECTED NOT  DETECTED Final   Enterobacteriaceae species NOT DETECTED NOT DETECTED Final   Enterobacter cloacae complex NOT DETECTED NOT DETECTED Final   Escherichia coli NOT DETECTED NOT DETECTED Final   Klebsiella oxytoca NOT DETECTED NOT DETECTED Final   Klebsiella pneumoniae NOT DETECTED NOT DETECTED Final   Proteus species NOT DETECTED NOT DETECTED Final   Serratia marcescens NOT DETECTED NOT DETECTED Final   Haemophilus influenzae NOT DETECTED NOT DETECTED Final   Neisseria meningitidis NOT DETECTED NOT DETECTED Final   Pseudomonas aeruginosa NOT DETECTED NOT DETECTED Final   Candida albicans NOT DETECTED NOT DETECTED Final   Candida glabrata NOT DETECTED NOT DETECTED Final   Candida krusei NOT DETECTED NOT DETECTED Final   Candida parapsilosis NOT DETECTED NOT DETECTED Final   Candida tropicalis NOT DETECTED NOT DETECTED Final    Comment: Performed at [facility], [address] [place], [place] [address]  MRSA PCR Screening     Status: Abnormal   Collection Time: 08/09/17  4:35 PM  Result Value Ref Range Status   MRSA by PCR POSITIVE (A) NEGATIVE Final    Comment:        The GeneXpert MRSA Assay (FDA approved for NASAL specimens only), is one component of a comprehensive MRSA colonization surveillance program. It is not intended to diagnose MRSA infection nor to guide or monitor treatment for MRSA infections. RESULT CALLED TO, READ BACK BY AND VERIFIED WITH: M.SEARS RN [phone] [email or web address]          Radiology Studies: No results found.      Scheduled Meds: . ARIPiprazole  2 mg Oral QHS  . aspirin EC  81 mg Oral Daily  . Chlorhexidine Gluconate Cloth  6 each Topical Q0600  . DULoxetine  30 mg Oral Daily  . feeding supplement  1 Container Oral TID BM  .  insulin aspart  0-9 Units Subcutaneous TID WC  . insulin glargine  12 Units Subcutaneous QHS  . mupirocin ointment  1 application Nasal BID  . polyethylene glycol  17 g Oral BID  . potassium chloride  20 mEq  Oral Daily  . saccharomyces boulardii  250 mg Oral BID  . senna-docusate  1 tablet Oral BID   Continuous Infusions: . cefTRIAXone (ROCEPHIN)  IV Stopped (08/11/17 1832)     LOS: 3 days    Time spent: 35 minutes.     Alba Cory, MD [facility] Pager [phone]  If 7PM-7AM, please contact night-coverage [email or web address] Password [facility] 08/12/2017, 2:46 PM

## 2017-08-12 NOTE — Progress Notes (Signed)
OT Cancellation Note  Patient Details Name: BRITANEY ESPAILLAT MRN: [id number] DOB: 1935-12-14   Cancelled Treatment:    Reason Eval/Treat Not Completed: OT screened, no needs identified, will sign off  OT screened, no needs identified, will sign off Pt is a resident at [facility].   Daughter reports pt has dementia, and they assist her with transfers, sometimes using lift, to w/c  Mikhi Athey, [place] 08/12/2017, 10:37 AM

## 2017-08-13 LAB — GLUCOSE, CAPILLARY
GLUCOSE-CAPILLARY: 108 mg/dL — AB (ref 65–99)
GLUCOSE-CAPILLARY: 142 mg/dL — AB (ref 65–99)
Glucose-Capillary: 134 mg/dL — ABNORMAL HIGH (ref 65–99)
Glucose-Capillary: 139 mg/dL — ABNORMAL HIGH (ref 65–99)

## 2017-08-13 NOTE — Progress Notes (Signed)
PROGRESS NOTE    Adrienne [name]  WUJ:[id number] DOB: 1936/09/26 DOA: 08/09/2017 PCP: Merri Brunette, MD    Brief Narrative:  81 year old female with a history of anxiety, depression, diabetes, hypertension, sleep apnea, morbid obesity, resident of [name], who presented to the ED because of low blood pressure. Patient has underlying dementia and is a very poor historian and patient's daughter who is by the bedside is unable to provide any meaningful history. According to the chart patient was brought in  early this morning due to low blood pressure, complains of upper abdominal pain, rectal pain. Patient's initial blood pressure was 86/ 36. CBG was 260. Reportedly has also had decreased oral intake, patient appears to be dehydrated clinically on exam.  After receiving about 2 L of fluid patient's blood pressure has improved. Workup reveals glucose of 227, bicarbonate of 19, troponin 0.46, white blood cell count 14.1, UA negative, chest x-ray shows mild cardiomegaly with central bony vascular prominence without overt CHF, CT abdomen and pelvis shows cholelithiasis without inflammation Patient admitted for possible ACS, low blood pressure, cardiology has been consulted   Assessment & Plan:   Principal Problem:   ACS (acute coronary syndrome) (HCC) Active Problems:   OSA (obstructive sleep apnea)   Depression   Essential hypertension   Diabetes (HCC)   Chronic pain syndrome   Acute on chronic respiratory failure with hypoxemia (HCC)   Chest pain   Elevated troponin;  Per cardiology less likely ischemic event.  ECHO no wall motion abnormalities. Heparin discontinue.  EKG; prolong QT.  Stress test out patient   Hypotension; resolved.  UA, chest x ray negative.  Related to dehydration. NSL  Leukocytosis; one of 2 blood culture growing gram positive rods.  Continue with ceftriaxone.  FU urine culture. Multiples species.  Chest x ray negative for PNA.  Follow Blood culture. I spoke  with microbiology, results will be available 9-30  Left Lower extremity edema, hip pain.  Check x ray. Report history of fall at home.  Doppler negative.  X ray negative for fracture.    DM; lantus, SSI.   Abdominal pain-CT abdomen pelvis shows cirrhosis of the liver, otherwise no acute pathology No BM. Laxative ordered, suppository  Had good BM per nurse report.   Mild dementia ; hold Namenda, Zyprexa due to prolong QT. Psych consulted. Started on Abilify.  Follow QT   Hypomagnesemia replace,.   Depression-continue Xanax and Cymbalta  Moisture associated skin damage bilateral buttocks, sacrum stage 3. ; decubitus sacrum  present on admission. Local care. Wound care consult  Dressing procedure/placement/frequency: Foam dressing to buttocks to protect and promote healing.  Aquacel to promote healing over sacrum wound.     DVT prophylaxis: on heparin  Code Status: full code.  Family Communication: no family at bedside.  Disposition Plan: to be determine.    Consultants:   Cardiology    Procedures:   ECHO    Antimicrobials:   Subjective: Denies abdominal pain.  Eating more.     Objective: Vitals:   08/12/17 1319 08/12/17 2219 08/13/17 0432 08/13/17 1259  BP: (!) 146/69 (!) 154/73 (!) 153/81 (!) 136/97  Pulse: 83 85 84 87  Resp: Temp: 98 F (36.7 C) 97.8 F (36.6 C) 98.5 F (36.9 C) 98.5 F (36.9 C)  TempSrc: Oral Oral Oral Oral  SpO2: 98% 98% 98% 99%  Weight:      Height:        Intake/Output Summary (Last 24 hours) at  08/13/17 1518 Last data filed at 08/13/17 1449  Gross per 24 hour  Intake              700 ml  Output             2150 ml  Net            -1450 ml   Filed Weights   08/09/17 0851  Weight: 90.7 kg (200 lb)    Examination:  General exam: NAD Respiratory system: CTA Cardiovascular system: S 1, S 2 RRR Gastrointestinal system: Abdomen soft, nt  Central nervous system: non focal.  Extremities: symmetric  power.      Data Reviewed: I have personally reviewed following labs and imaging studies  CBC:  Recent Labs Lab 08/09/17 0422 08/10/17 0141 08/11/17 0940 08/12/17 0419  WBC 14.1* 13.5* 8.3 8.7  NEUTROABS 11.1*  --   --   --   HGB 13.1 10.6* 9.9* 9.9*  HCT 39.8 33.8* 31.5* 32.0*  MCV 99.0 100.0 99.7 102.6*  PLT 225 204 159 146*   Basic Metabolic Panel:  Recent Labs Lab 08/09/17 0422 08/09/17 1321 08/10/17 0141 08/11/17 0516 08/11/17 0940 08/12/17 0419  NA 136  --  135  --  136 136  K 4.1  --  4.6  --  4.4 4.2  CL 103  --  108  --  109 110  CO2 19*  --  18*  --  18* 19*  GLUCOSE 227*  --  182*  --  211* 116*  BUN 18  --  27*  --  20 13  CREATININE 1.20*  --  1.12*  --  0.80 0.56  CALCIUM 9.5  --  8.2*  --  8.0* 7.9*  MG  --  1.5*  --  2.1  --   --    GFR: Estimated Creatinine Clearance: 62.2 mL/min (by C-G formula based on SCr of 0.56 mg/dL). Liver Function Tests:  Recent Labs Lab 08/09/17 0422 08/10/17 0141  AST 56* 44*  ALT 28 23  ALKPHOS 62 44  BILITOT 0.6 0.5  PROT 8.7* 7.6  ALBUMIN 3.2* 3.0*   No results for input(s): LIPASE, AMYLASE in the last 168 hours. No results for input(s): AMMONIA in the last 168 hours. Coagulation Profile: No results for input(s): INR, PROTIME in the last 168 hours. Cardiac Enzymes:  Recent Labs Lab 08/09/17 0422 08/09/17 0645 08/09/17 1321 08/09/17 1539 08/09/17 2128  TROPONINI 0.46* 0.46* 0.49* 0.48* 0.52*   BNP (last 3 results) No results for input(s): PROBNP in the last 8760 hours. HbA1C: No results for input(s): HGBA1C in the last 72 hours. CBG:  Recent Labs Lab 08/12/17 1201 08/12/17 1701 08/12/17 2215 08/13/17 0805 08/13/17 1126  GLUCAP 98 138* 101* 108* 142*   Lipid Profile: No results for input(s): CHOL, HDL, LDLCALC, TRIG, CHOLHDL, LDLDIRECT in the last 72 hours. Thyroid Function Tests: No results for input(s): TSH, T4TOTAL, FREET4, T3FREE, THYROIDAB in the last 72 hours. Anemia  Panel: No results for input(s): VITAMINB12, FOLATE, FERRITIN, TIBC, IRON, RETICCTPCT in the last 72 hours. Sepsis Labs:  Recent Labs Lab 08/09/17 0422 08/09/17 0850  LATICACIDVEN 4.7* 3.2*    Recent Results (from the past 240 hour(s))  Urine Culture     Status: Abnormal   Collection Time: 08/09/17  7:54 AM  Result Value Ref Range Status   Specimen Description URINE, RANDOM  Final   Special Requests NONE  Final   Culture MULTIPLE SPECIES PRESENT, SUGGEST RECOLLECTION (A)  Final   Report Status 08/12/2017 FINAL  Final  Culture, blood (Routine X 2) w Reflex to ID Panel     Status: None (Preliminary result)   Collection Time: 08/09/17  1:18 PM  Result Value Ref Range Status   Specimen Description BLOOD RIGHT ARM  Final   Special Requests IN PEDIATRIC BOTTLE Blood Culture adequate volume  Final   Culture   Final    NO GROWTH 4 DAYS Performed at [facility], [address], [place], [place] [address]    Report Status PENDING  Incomplete  Culture, blood (Routine X 2) w Reflex to ID Panel     Status: None (Preliminary result)   Collection Time: 08/09/17  1:24 PM  Result Value Ref Range Status   Specimen Description BLOOD RIGHT ARM  Final   Special Requests IN PEDIATRIC BOTTLE Blood Culture adequate volume  Final   Culture  Setup Time   Final    GRAM POSITIVE RODS IN PEDIATRIC BOTTLE CRITICAL RESULT CALLED TO, READ BACK BY AND VERIFIED WITH: C. Shade Pharm.D. 15:10 08/11/17 ([email or web address])    Culture   Final    GRAM POSITIVE RODS IDENTIFICATION TO FOLLOW Performed at [facility], [address] [place], [place] [address]    Report Status PENDING  Incomplete  Blood Culture ID Panel (Reflexed)     Status: None   Collection Time: 08/09/17  1:24 PM  Result Value Ref Range Status   Enterococcus species NOT DETECTED NOT DETECTED Final   Listeria monocytogenes NOT DETECTED NOT DETECTED Final   Staphylococcus species NOT DETECTED NOT DETECTED Final   Staphylococcus aureus  NOT DETECTED NOT DETECTED Final   Streptococcus species NOT DETECTED NOT DETECTED Final   Streptococcus agalactiae NOT DETECTED NOT DETECTED Final   Streptococcus pneumoniae NOT DETECTED NOT DETECTED Final   Streptococcus pyogenes NOT DETECTED NOT DETECTED Final   Acinetobacter baumannii NOT DETECTED NOT DETECTED Final   Enterobacteriaceae species NOT DETECTED NOT DETECTED Final   Enterobacter cloacae complex NOT DETECTED NOT DETECTED Final   Escherichia coli NOT DETECTED NOT DETECTED Final   Klebsiella oxytoca NOT DETECTED NOT DETECTED Final   Klebsiella pneumoniae NOT DETECTED NOT DETECTED Final   Proteus species NOT DETECTED NOT DETECTED Final   Serratia marcescens NOT DETECTED NOT DETECTED Final   Haemophilus influenzae NOT DETECTED NOT DETECTED Final   Neisseria meningitidis NOT DETECTED NOT DETECTED Final   Pseudomonas aeruginosa NOT DETECTED NOT DETECTED Final   Candida albicans NOT DETECTED NOT DETECTED Final   Candida glabrata NOT DETECTED NOT DETECTED Final   Candida krusei NOT DETECTED NOT DETECTED Final   Candida parapsilosis NOT DETECTED NOT DETECTED Final   Candida tropicalis NOT DETECTED NOT DETECTED Final    Comment: Performed at [facility], [address] [place], [place] [address]  MRSA PCR Screening     Status: Abnormal   Collection Time: 08/09/17  4:35 PM  Result Value Ref Range Status   MRSA by PCR POSITIVE (A) NEGATIVE Final    Comment:        The GeneXpert MRSA Assay (FDA approved for NASAL specimens only), is one component of a comprehensive MRSA colonization surveillance program. It is not intended to diagnose MRSA infection nor to guide or monitor treatment for MRSA infections. RESULT CALLED TO, READ BACK BY AND VERIFIED WITH: M.SEARS RN [phone] [email or web address]          Radiology Studies: No results found.      Scheduled  Meds: . ARIPiprazole  2 mg Oral QHS  . aspirin EC  81 mg Oral Daily  . Chlorhexidine Gluconate Cloth  6 each  Topical Q0600  . DULoxetine  30 mg Oral Daily  . feeding supplement  1 Container Oral TID BM  . insulin aspart  0-9 Units Subcutaneous TID WC  . insulin glargine  12 Units Subcutaneous QHS  . mupirocin ointment  1 application Nasal BID  . polyethylene glycol  17 g Oral BID  . potassium chloride  20 mEq Oral Daily  . saccharomyces boulardii  250 mg Oral BID  . senna-docusate  1 tablet Oral BID   Continuous Infusions: . cefTRIAXone (ROCEPHIN)  IV Stopped (08/12/17 1715)     LOS: 4 days    Time spent: 35 minutes.     Alba Cory, MD [facility] Pager [phone]  If 7PM-7AM, please contact night-coverage [email or web address] Password [email or web address] 08/13/2017, 3:17 PM

## 2017-08-13 NOTE — Progress Notes (Signed)
Dr Lindaann Slough last rounding note reviewed, no additional recs over the weekend. Please call with questions.   Dominga Ferry MD

## 2017-08-13 NOTE — Progress Notes (Signed)
Patient has had two episodes of choking on solid foods  today. Daughter was present for one. States she has had some episodes at [facility] as well. Thinks she "shoves too much food in her mouth at one time". Patient also states she has a lot of bloating when eating. Encouraged to sit as straight up as possible when eating, avoid talking and eat slower. Melton Alar, RN

## 2017-08-14 DIAGNOSIS — F0390 Unspecified dementia without behavioral disturbance: Secondary | ICD-10-CM

## 2017-08-14 DIAGNOSIS — E11622 Type 2 diabetes mellitus with other skin ulcer: Secondary | ICD-10-CM

## 2017-08-14 DIAGNOSIS — I959 Hypotension, unspecified: Secondary | ICD-10-CM

## 2017-08-14 DIAGNOSIS — R7881 Bacteremia: Secondary | ICD-10-CM

## 2017-08-14 LAB — CULTURE, BLOOD (ROUTINE X 2)
Culture: NO GROWTH
SPECIAL REQUESTS: ADEQUATE
Special Requests: ADEQUATE

## 2017-08-14 LAB — BASIC METABOLIC PANEL
Anion gap: 6 (ref 5–15)
BUN: 9 mg/dL (ref 6–20)
CALCIUM: 8.6 mg/dL — AB (ref 8.9–10.3)
CHLORIDE: 105 mmol/L (ref 101–111)
CO2: 23 mmol/L (ref 22–32)
Creatinine, Ser: 0.56 mg/dL (ref 0.44–1.00)
GFR calc Af Amer: >60 mL/min (ref >60)
GFR calc non Af Amer: >60 mL/min (ref >60)
Glucose, Bld: 108 mg/dL — ABNORMAL HIGH (ref 65–99)
Potassium: 4.4 mmol/L (ref 3.5–5.1)
SODIUM: 134 mmol/L — AB (ref 135–145)

## 2017-08-14 LAB — GLUCOSE, CAPILLARY
GLUCOSE-CAPILLARY: 112 mg/dL — AB (ref 65–99)
Glucose-Capillary: 123 mg/dL — ABNORMAL HIGH (ref 65–99)
Glucose-Capillary: 97 mg/dL (ref 65–99)
Glucose-Capillary: 139 mg/dL — ABNORMAL HIGH (ref 65–99)

## 2017-08-14 LAB — AMMONIA: AMMONIA: 36 umol/L — AB (ref 9–35)

## 2017-08-14 LAB — CBC
HCT: 34.2 % — ABNORMAL LOW (ref 36.0–46.0)
HEMOGLOBIN: 10.9 g/dL — AB (ref 12.0–15.0)
MCH: 31.8 pg (ref 26.0–34.0)
MCHC: 31.9 g/dL (ref 30.0–36.0)
MCV: 99.7 fL (ref 78.0–100.0)
Platelets: 172 10*3/uL (ref 150–400)
RBC: 3.43 MIL/uL — ABNORMAL LOW (ref 3.87–5.11)
RDW: 16.4 % — AB (ref 11.5–15.5)
WBC: 8.5 10*3/uL (ref 4.0–10.5)

## 2017-08-14 LAB — VITAMIN B12: Vitamin B-12: 929 pg/mL — ABNORMAL HIGH (ref 180–914)

## 2017-08-14 NOTE — Evaluation (Signed)
Clinical/Bedside Swallow Evaluation Patient Details  Name: Adrienne [name] MRN: [id number] Date of Birth: 03/18/1936  Today's Date: 08/14/2017 Time: SLP Start Time (ACUTE ONLY): 0940 SLP Stop Time (ACUTE ONLY): 1000 SLP Time Calculation (min) (ACUTE ONLY): 20 min  Past Medical History:  Past Medical History:  Diagnosis Date  . Anxiety   . Depression   . Diabetes mellitus    ORAL MED  . Hypercholesterolemia   . Hypertension   . Hypothyroidism    STATES SHE NO LONGER NEEDS THYROID SUPPLEMENT  . Normal nuclear stress test Jan 2012   No ischemia. EF 81%  . Obesity   . Osteoarthritis (arthritis due to wear and tear of joints)    PAIN AND OA BOTH KNEES  . Osteopenia   . Sleep apnea    DOES NOT USE CPAP-UNABLE TO TOLERATE   Past Surgical History:  Past Surgical History:  Procedure Laterality Date  . ABDOMINAL HYSTERECTOMY    . BILATERAL OOPHORECTOMY    . CARDIAC CATHETERIZATION  02/03/1999   EF 70%  . CARDIOVASCULAR STRESS TEST  11/30/2010   EF 81%, NORMAL  . LAPAROTOMY N/A 12/22/2016   Procedure: EXPLORATORY LAPAROTOMY;  Surgeon: Harriette Bouillon, MD;  Location: [facility] OR;  Service: General;  Laterality: N/A;  . LYSIS OF ADHESION N/A 12/22/2016   Procedure: LYSIS OF ADHESION;  Surgeon: Harriette Bouillon, MD;  Location: [facility] OR;  Service: General;  Laterality: N/A;  . ORIF ANKLE FRACTURE  2004  . THYROIDECTOMY, PARTIAL    . TOTAL KNEE ARTHROPLASTY  04/21/2012   Procedure: TOTAL KNEE ARTHROPLASTY;  Surgeon: Loanne Drilling, MD;  Location: [facility] ORS;  Service: Orthopedics;  Laterality: Right;  . TOTAL KNEE ARTHROPLASTY Left 01/12/2013   Procedure: TOTAL KNEE ARTHROPLASTY;  Surgeon: Loanne Drilling, MD;  Location: [facility] ORS;  Service: Orthopedics;  Laterality: Left;  . UMBILICAL HERNIA REPAIR N/A 12/22/2016   Procedure: HERNIA REPAIR UMBILICAL ADULT;  Surgeon: Harriette Bouillon, MD;  Location: [facility] OR;  Service: General;  Laterality: N/A;  . US ECHOCARDIOGRAPHY  08/21/2004   EF 60-65%  . VESICOVAGINAL FISTULA  CLOSURE W/ TAH     HPI:  81 year old female with a history of anxiety, depression, diabetes, hypertension, sleep apnea, morbid obesity, resident of [name], who presented to the ED because of low blood pressure. Patient has underlying dementia and is a very poor historian. Noted to have two episodes of choking on solid foods 9/29; per notes, dtr reports similar episodes at SNF.   Assessment / Plan / Recommendation Clinical Impression  Pt presents with no s/s of an overt dysphagia - she is attentive to POs, no sensorimotor deficits, actively masticates and initiates a swallow response.  No coughing nor other s/s of aspiration observed.  At this time, no diet modifications are indicated, but behavioral strategies may be warranted to assist pt with slower pace of eating, smaller quantities, in order to ensure safety, minimize impulsivity.  No family available for education.  Rec resuming regular solids, thin liquids.   SLP Visit Diagnosis: Dysphagia, unspecified (R13.10)    Aspiration Risk  Mild aspiration risk    Diet Recommendation   carb modified, thin liquids  Medication Administration: Whole meds with liquid    Other  Recommendations Oral Care Recommendations: Oral care BID   Follow up Recommendations None      Frequency and Duration            Prognosis        Swallow Study   General Date of  Onset: 08/09/17 HPI: 81 year old female with a history of anxiety, depression, diabetes, hypertension, sleep apnea, morbid obesity, resident of [name], who presented to the ED because of low blood pressure. Patient has underlying dementia and is a very poor historian. Noted to have two episodes of choking on solid foods 9/29; per notes, dtr reports similar episodes at SNF. Type of Study: Bedside Swallow Evaluation Previous Swallow Assessment: none per records Diet Prior to this Study: Regular;Thin liquids Temperature Spikes Noted: No Respiratory Status: Room air History of Recent  Intubation: No Behavior/Cognition: Alert;Cooperative;Pleasant mood Oral Cavity Assessment: Within Functional Limits Oral Care Completed by SLP: No Oral Cavity - Dentition: Adequate natural dentition Vision: Functional for self-feeding Self-Feeding Abilities: Able to feed self Patient Positioning: Upright in bed Baseline Vocal Quality: Normal Volitional Cough: Strong Volitional Swallow: Able to elicit    Oral/Motor/Sensory Function Overall Oral Motor/Sensory Function: Within functional limits   Ice Chips Ice chips: Within functional limits   Thin Liquid Thin Liquid: Within functional limits    Nectar Thick Nectar Thick Liquid: Not tested   Honey Thick Honey Thick Liquid: Not tested   Puree Puree: Within functional limits   Solid   GO   Solid: Within functional limits        Blenda Mounts Laurice 08/14/2017,10:06 AM

## 2017-08-14 NOTE — Consult Note (Signed)
[facility] for Infectious Disease    Date of Admission:  08/09/2017   Total days of antibiotics 4 ceftriaxone               Reason for Consult: Dermabacter in BCx    Referring Provider: Regalado   Assessment: Dermabacter in BCx Decubitus ulcers DM2  Plan: 1. Stop anbx 2. This is a contaminant from the blood draw 3. If concerned, repeat her BCx 4. Wound care 5. Nutrition 6. Off loading 7. Air flow bed  Dermabacter can be found on human skin. It has very rarely been found in immunocompromised pt's, peritonitis pt, this does not fit her scenario.   Available as needed.   Principal Problem:   ACS (acute coronary syndrome) (HCC) Active Problems:   OSA (obstructive sleep apnea)   Depression   Essential hypertension   Diabetes (HCC)   Chronic pain syndrome   Acute on chronic respiratory failure with hypoxemia (HCC)   Chest pain   . ARIPiprazole  2 mg Oral QHS  . aspirin EC  81 mg Oral Daily  . DULoxetine  30 mg Oral Daily  . feeding supplement  1 Container Oral TID BM  . insulin aspart  0-9 Units Subcutaneous TID WC  . insulin glargine  12 Units Subcutaneous QHS  . mupirocin ointment  1 application Nasal BID  . polyethylene glycol  17 g Oral BID  . potassium chloride  20 mEq Oral Daily  . saccharomyces boulardii  250 mg Oral BID  . senna-docusate  1 tablet Oral BID    HPI: Adrienne [name] is a 81 y.o. female with hx of DM2, morbid obesity, dementia, adm from SNF on 9-25 with hypotension, abd, and rectal pain. Her WBC was 14.1, she was afebrile. CT abd did not reveal a source but she had mildly elevated troponin.  She was started on ceftriaxone.  She was eval by CV and not felt unlikely to be having an ischemic event.   Her BCx grew a gram positive rod- this has since been ID in 1/2 BCx as dermabacter sp. Her UCx was polymicrobial, her UA showed 0-5 WBC.  She has remained afebrile, her WBC is normal, her BP has improved and she is now hypertensive.     Review of Systems: Review of Systems  Unable to perform ROS: Dementia    Past Medical History:  Diagnosis Date  . Anxiety   . Depression   . Diabetes mellitus    ORAL MED  . Hypercholesterolemia   . Hypertension   . Hypothyroidism    STATES SHE NO LONGER NEEDS THYROID SUPPLEMENT  . Normal nuclear stress test Jan 2012   No ischemia. EF 81%  . Obesity   . Osteoarthritis (arthritis due to wear and tear of joints)    PAIN AND OA BOTH KNEES  . Osteopenia   . Sleep apnea    DOES NOT USE CPAP-UNABLE TO TOLERATE    Social History  Substance Use Topics  . Smoking status: Never Smoker  . Smokeless tobacco: Never Used  . Alcohol use No    Family History  Problem Relation Age of Onset  . Heart disease Father   . Hypertension Father   . Heart attack Father   . Colon cancer Mother   . Heart attack Mother   . Hypertension Mother   . Breast cancer Sister      Medications:  Scheduled: . ARIPiprazole  2 mg Oral QHS  .  aspirin EC  81 mg Oral Daily  . DULoxetine  30 mg Oral Daily  . feeding supplement  1 Container Oral TID BM  . insulin aspart  0-9 Units Subcutaneous TID WC  . insulin glargine  12 Units Subcutaneous QHS  . mupirocin ointment  1 application Nasal BID  . polyethylene glycol  17 g Oral BID  . potassium chloride  20 mEq Oral Daily  . saccharomyces boulardii  250 mg Oral BID  . senna-docusate  1 tablet Oral BID    Abtx:  Anti-infectives    Start     Dose/Rate Route Frequency Ordered Stop   08/11/17 1600  cefTRIAXone (ROCEPHIN) 2 g in dextrose 5 % 50 mL IVPB     2 g 100 mL/hr over 30 Minutes Intravenous Every 24 hours 08/11/17 1522     08/10/17 1600  cefTRIAXone (ROCEPHIN) 1 g in dextrose 5 % 50 mL IVPB  Status:  Discontinued     1 g 100 mL/hr over 30 Minutes Intravenous Every 24 hours 08/10/17 1400 08/11/17 1522        OBJECTIVE: Blood pressure (!) 152/70, pulse 87, temperature 98.7 F (37.1 C), temperature source Oral, resp. rate 18, height 5'  4" (1.626 m), weight 90.7 kg (200 lb), SpO2 95 %.  Physical Exam  Constitutional: She is well-developed, well-nourished, and in no distress. No distress.  HENT:  Mouth/Throat: No oropharyngeal exudate.  Eyes: Pupils are equal, round, and reactive to light. EOM are normal.  Neck: Neck supple.  Cardiovascular: Normal rate, regular rhythm and normal heart sounds.   Pulmonary/Chest: Effort normal and breath sounds normal. No respiratory distress.  Abdominal: Soft. Bowel sounds are normal. She exhibits distension. There is no tenderness. There is no rebound and no guarding.  Musculoskeletal: She exhibits edema.  Lymphadenopathy:    She has no cervical adenopathy.  Psychiatric:  MSE- Where are you: " a fun place!"    Lab Results Results for orders placed or performed during the hospital encounter of 08/09/17 (from the past 48 hour(s))  Glucose, capillary     Status: Abnormal   Collection Time: 08/12/17  5:01 PM  Result Value Ref Range   Glucose-Capillary 138 (H) 65 - 99 mg/dL  Glucose, capillary     Status: Abnormal   Collection Time: 08/12/17 10:15 PM  Result Value Ref Range   Glucose-Capillary 101 (H) 65 - 99 mg/dL  Glucose, capillary     Status: Abnormal   Collection Time: 08/13/17  8:05 AM  Result Value Ref Range   Glucose-Capillary 108 (H) 65 - 99 mg/dL  Glucose, capillary     Status: Abnormal   Collection Time: 08/13/17 11:26 AM  Result Value Ref Range   Glucose-Capillary 142 (H) 65 - 99 mg/dL  Glucose, capillary     Status: Abnormal   Collection Time: 08/13/17  4:32 PM  Result Value Ref Range   Glucose-Capillary 134 (H) 65 - 99 mg/dL  Glucose, capillary     Status: Abnormal   Collection Time: 08/13/17  9:11 PM  Result Value Ref Range   Glucose-Capillary 139 (H) 65 - 99 mg/dL  Glucose, capillary     Status: Abnormal   Collection Time: 08/14/17  7:53 AM  Result Value Ref Range   Glucose-Capillary 112 (H) 65 - 99 mg/dL  Glucose, capillary     Status: Abnormal    Collection Time: 08/14/17 11:50 AM  Result Value Ref Range   Glucose-Capillary 123 (H) 65 - 99 mg/dL  CBC  Status: Abnormal   Collection Time: 08/14/17  1:01 PM  Result Value Ref Range   WBC 8.5 4.0 - 10.5 K/uL   RBC 3.43 (L) 3.87 - 5.11 MIL/uL   Hemoglobin 10.9 (L) 12.0 - 15.0 g/dL   HCT 34.2 (L) 36.0 - 46.0 %   MCV 99.7 78.0 - 100.0 fL   MCH 31.8 26.0 - 34.0 pg   MCHC 31.9 30.0 - 36.0 g/dL   RDW 16.4 (H) 11.5 - 15.5 %   Platelets 172 150 - 400 K/uL  Basic metabolic panel     Status: Abnormal   Collection Time: 08/14/17  1:01 PM  Result Value Ref Range   Sodium 134 (L) 135 - 145 mmol/L   Potassium 4.4 3.5 - 5.1 mmol/L   Chloride 105 101 - 111 mmol/L   CO2 23 22 - 32 mmol/L   Glucose, Bld 108 (H) 65 - 99 mg/dL   BUN 9 6 - 20 mg/dL   Creatinine, Ser 0.56 0.44 - 1.00 mg/dL   Calcium 8.6 (L) 8.9 - 10.3 mg/dL   GFR calc non Af Amer >60 >60 mL/min   GFR calc Af Amer >60 >60 mL/min    Comment: (NOTE) The eGFR has been calculated using the CKD EPI equation. This calculation has not been validated in all clinical situations. eGFR's persistently <60 mL/min signify possible Chronic Kidney Disease.    Anion gap 6 5 - 15  Ammonia     Status: Abnormal   Collection Time: 08/14/17  2:50 PM  Result Value Ref Range   Ammonia 36 (H) 9 - 35 umol/L      Component Value Date/Time   SDES BLOOD RIGHT ARM 08/09/2017 1324   SPECREQUEST IN PEDIATRIC BOTTLE Blood Culture adequate volume 08/09/2017 1324   CULT  08/09/2017 1324    PROBABLE DERMABACTER SPECIES Performed at [facility], [place] [address] [place], [place] [address]    REPTSTATUS 08/14/2017 FINAL 08/09/2017 1324   No results found. Recent Results (from the past 240 hour(s))  Urine Culture     Status: Abnormal   Collection Time: 08/09/17  7:54 AM  Result Value Ref Range Status   Specimen Description URINE, RANDOM  Final   Special Requests NONE  Final   Culture MULTIPLE SPECIES PRESENT, SUGGEST RECOLLECTION (A)  Final    Report Status 08/12/2017 FINAL  Final  Culture, blood (Routine X 2) w Reflex to ID Panel     Status: None   Collection Time: 08/09/17  1:18 PM  Result Value Ref Range Status   Specimen Description BLOOD RIGHT ARM  Final   Special Requests IN PEDIATRIC BOTTLE Blood Culture adequate volume  Final   Culture   Final    NO GROWTH 5 DAYS Performed at [facility], [address], [place], [place] [address]    Report Status 08/14/2017 FINAL  Final  Culture, blood (Routine X 2) w Reflex to ID Panel     Status: None   Collection Time: 08/09/17  1:24 PM  Result Value Ref Range Status   Specimen Description BLOOD RIGHT ARM  Final   Special Requests IN PEDIATRIC BOTTLE Blood Culture adequate volume  Final   Culture  Setup Time   Final    GRAM POSITIVE RODS IN PEDIATRIC BOTTLE CRITICAL RESULT CALLED TO, READ BACK BY AND VERIFIED WITH: C. Shade Pharm.D. 15:10 08/11/17 ([email or web address])    Culture   Final    PROBABLE DERMABACTER SPECIES Performed at [facility],  [address] [place], [place] [address]    Report Status 08/14/2017 FINAL  Final  Blood Culture ID Panel (Reflexed)     Status: None   Collection Time: 08/09/17  1:24 PM  Result Value Ref Range Status   Enterococcus species NOT DETECTED NOT DETECTED Final   Listeria monocytogenes NOT DETECTED NOT DETECTED Final   Staphylococcus species NOT DETECTED NOT DETECTED Final   Staphylococcus aureus NOT DETECTED NOT DETECTED Final   Streptococcus species NOT DETECTED NOT DETECTED Final   Streptococcus agalactiae NOT DETECTED NOT DETECTED Final   Streptococcus pneumoniae NOT DETECTED NOT DETECTED Final   Streptococcus pyogenes NOT DETECTED NOT DETECTED Final   Acinetobacter baumannii NOT DETECTED NOT DETECTED Final   Enterobacteriaceae species NOT DETECTED NOT DETECTED Final   Enterobacter cloacae complex NOT DETECTED NOT DETECTED Final   Escherichia coli NOT DETECTED NOT DETECTED Final   Klebsiella oxytoca NOT DETECTED NOT DETECTED  Final   Klebsiella pneumoniae NOT DETECTED NOT DETECTED Final   Proteus species NOT DETECTED NOT DETECTED Final   Serratia marcescens NOT DETECTED NOT DETECTED Final   Haemophilus influenzae NOT DETECTED NOT DETECTED Final   Neisseria meningitidis NOT DETECTED NOT DETECTED Final   Pseudomonas aeruginosa NOT DETECTED NOT DETECTED Final   Candida albicans NOT DETECTED NOT DETECTED Final   Candida glabrata NOT DETECTED NOT DETECTED Final   Candida krusei NOT DETECTED NOT DETECTED Final   Candida parapsilosis NOT DETECTED NOT DETECTED Final   Candida tropicalis NOT DETECTED NOT DETECTED Final    Comment: Performed at [facility], [place] [address] [place], [place] [address]  MRSA PCR Screening     Status: Abnormal   Collection Time: 08/09/17  4:35 PM  Result Value Ref Range Status   MRSA by PCR POSITIVE (A) NEGATIVE Final    Comment:        The GeneXpert MRSA Assay (FDA approved for NASAL specimens only), is one component of a comprehensive MRSA colonization surveillance program. It is not intended to diagnose MRSA infection nor to guide or monitor treatment for MRSA infections. RESULT CALLED TO, READ BACK BY AND VERIFIED WITH: M.SEARS RN [phone] A.QUIZON     Microbiology: Recent Results (from the past 240 hour(s))  Urine Culture     Status: Abnormal   Collection Time: 08/09/17  7:54 AM  Result Value Ref Range Status   Specimen Description URINE, RANDOM  Final   Special Requests NONE  Final   Culture MULTIPLE SPECIES PRESENT, SUGGEST RECOLLECTION (A)  Final   Report Status 08/12/2017 FINAL  Final  Culture, blood (Routine X 2) w Reflex to ID Panel     Status: None   Collection Time: 08/09/17  1:18 PM  Result Value Ref Range Status   Specimen Description BLOOD RIGHT ARM  Final   Special Requests IN PEDIATRIC BOTTLE Blood Culture adequate volume  Final   Culture   Final    NO GROWTH 5 DAYS Performed at [facility], [address] [place],  [address]     Report Status 08/14/2017 FINAL  Final  Culture, blood (Routine X 2) w Reflex to ID Panel     Status: None   Collection Time: 08/09/17  1:24 PM  Result Value Ref Range Status   Specimen Description BLOOD RIGHT ARM  Final   Special Requests IN PEDIATRIC BOTTLE Blood Culture adequate volume  Final   Culture  Setup Time   Final    GRAM POSITIVE RODS IN PEDIATRIC  BOTTLE CRITICAL RESULT CALLED TO, READ BACK BY AND VERIFIED WITH: C. Shade Pharm.D. 15:10 08/11/17 ([email or web address])    Culture   Final    PROBABLE DERMABACTER SPECIES Performed at [facility], [place] [address] [place], [place] [address]    Report Status 08/14/2017 FINAL  Final  Blood Culture ID Panel (Reflexed)     Status: None   Collection Time: 08/09/17  1:24 PM  Result Value Ref Range Status   Enterococcus species NOT DETECTED NOT DETECTED Final   Listeria monocytogenes NOT DETECTED NOT DETECTED Final   Staphylococcus species NOT DETECTED NOT DETECTED Final   Staphylococcus aureus NOT DETECTED NOT DETECTED Final   Streptococcus species NOT DETECTED NOT DETECTED Final   Streptococcus agalactiae NOT DETECTED NOT DETECTED Final   Streptococcus pneumoniae NOT DETECTED NOT DETECTED Final   Streptococcus pyogenes NOT DETECTED NOT DETECTED Final   Acinetobacter baumannii NOT DETECTED NOT DETECTED Final   Enterobacteriaceae species NOT DETECTED NOT DETECTED Final   Enterobacter cloacae complex NOT DETECTED NOT DETECTED Final   Escherichia coli NOT DETECTED NOT DETECTED Final   Klebsiella oxytoca NOT DETECTED NOT DETECTED Final   Klebsiella pneumoniae NOT DETECTED NOT DETECTED Final   Proteus species NOT DETECTED NOT DETECTED Final   Serratia marcescens NOT DETECTED NOT DETECTED Final   Haemophilus influenzae NOT DETECTED NOT DETECTED Final   Neisseria meningitidis NOT DETECTED NOT DETECTED Final   Pseudomonas aeruginosa NOT DETECTED NOT DETECTED Final   Candida albicans NOT DETECTED NOT DETECTED Final   Candida glabrata NOT  DETECTED NOT DETECTED Final   Candida krusei NOT DETECTED NOT DETECTED Final   Candida parapsilosis NOT DETECTED NOT DETECTED Final   Candida tropicalis NOT DETECTED NOT DETECTED Final    Comment: Performed at [facility], [place] [address], [place], [place] [address]  MRSA PCR Screening     Status: Abnormal   Collection Time: 08/09/17  4:35 PM  Result Value Ref Range Status   MRSA by PCR POSITIVE (A) NEGATIVE Final    Comment:        The GeneXpert MRSA Assay (FDA approved for NASAL specimens only), is one component of a comprehensive MRSA colonization surveillance program. It is not intended to diagnose MRSA infection nor to guide or monitor treatment for MRSA infections. RESULT CALLED TO, READ BACK BY AND VERIFIED WITH: M.SEARS RN [phone] [id number] A.QUIZON     Bobby Rumpf, MD [facility] for Infectious Disease [facility] [phone] 08/14/2017, 3:52 PM

## 2017-08-14 NOTE — Progress Notes (Signed)
PROGRESS NOTE    Adrienne [name]  AVW:[id number] DOB: May 27, 1936 DOA: 08/09/2017 PCP: Merri Brunette, MD    Brief Narrative:  81 year old female with a history of anxiety, depression, diabetes, hypertension, sleep apnea, morbid obesity, resident of [name], who presented to the ED because of low blood pressure. Patient has underlying dementia and is a very poor historian and patient's daughter who is by the bedside is unable to provide any meaningful history. According to the chart patient was brought in  early this morning due to low blood pressure, complains of upper abdominal pain, rectal pain. Patient's initial blood pressure was 86/ 36. CBG was 260. Reportedly has also had decreased oral intake, patient appears to be dehydrated clinically on exam.  After receiving about 2 L of fluid patient's blood pressure has improved. Workup reveals glucose of 227, bicarbonate of 19, troponin 0.46, white blood cell count 14.1, UA negative, chest x-ray shows mild cardiomegaly with central bony vascular prominence without overt CHF, CT abdomen and pelvis shows cholelithiasis without inflammation Patient admitted for possible ACS, low blood pressure, cardiology has been consulted   Assessment & Plan:   Principal Problem:   ACS (acute coronary syndrome) (HCC) Active Problems:   OSA (obstructive sleep apnea)   Depression   Essential hypertension   Diabetes (HCC)   Chronic pain syndrome   Acute on chronic respiratory failure with hypoxemia (HCC)   Chest pain   Elevated troponin;  Per cardiology less likely ischemic event.  ECHO no wall motion abnormalities. Heparin discontinue.  EKG; prolong QT.  Stress test out patient   Hypotension; resolved.  UA, chest x ray negative.  Related to dehydration. NSL  Leukocytosis; one of 2 blood culture growing gram positive rods.  Received ceftriaxone for 4 days.  FU urine culture. Multiples species.  Chest x ray negative for PNA.  Follow Blood culture.;  Dermabacter. ID consulted.   Left Lower extremity edema, hip pain.  Check x ray. Report history of fall at home.  Doppler negative.  X ray negative for fracture.   Encephalopathy;  worsening confusion per daughter.  Will get MRI Check ammonia na dB 12.   DM; lantus, SSI.   Abdominal pain-CT abdomen pelvis shows cirrhosis of the liver, otherwise no acute pathology No BM. Laxative ordered, suppository  Had good BM per nurse report.   Mild dementia ; hold Namenda, Zyprexa due to prolong QT. Psych consulted. Started on Abilify.  Follow QT   Hypomagnesemia replace,.   Depression-continue Xanax and Cymbalta  Moisture associated skin damage bilateral buttocks, sacrum stage 3. ; decubitus sacrum  present on admission. Local care. Wound care consult  Dressing procedure/placement/frequency: Foam dressing to buttocks to protect and promote healing.  Aquacel to promote healing over sacrum wound.     DVT prophylaxis: on heparin  Code Status: full code.  Family Communication: no family at bedside.  Disposition Plan: to be determine.    Consultants:   Cardiology    Procedures:   ECHO    Antimicrobials:   Subjective: pleasantly confuse.  Complaining of left foot pain     Objective: Vitals:   08/13/17 1259 08/13/17 1623 08/13/17 2114 08/14/17 0536  BP: (!) 136/97 (!) 146/66 (!) 150/70 (!) 152/70  Pulse: 87 87 92 87  Resp: Temp: 98.5 F (36.9 C) 97.7 F (36.5 C) 98.2 F (36.8 C) 98.7 F (37.1 C)  TempSrc: Oral Oral Oral Oral  SpO2: 99% 98% 97% 95%  Weight:  Height:        Intake/Output Summary (Last 24 hours) at 08/14/17 1718 Last data filed at 08/14/17 1459  Gross per 24 hour  Intake              360 ml  Output             1900 ml  Net            -1540 ml   Filed Weights   08/09/17 0851  Weight: 90.7 kg (200 lb)    Examination:  General exam: NAD Respiratory system: CTA Cardiovascular system: S 1, S 2 RRR Gastrointestinal  system: Abdomen soft, nt  Central nervous system: non focal.  Extremities: symmetric power.  Skin stage 3 decubitus.      Data Reviewed: I have personally reviewed following labs and imaging studies  CBC:  Recent Labs Lab 08/09/17 0422 08/10/17 0141 08/11/17 0940 08/12/17 0419 08/14/17 1301  WBC 14.1* 13.5* 8.3 8.7 8.5  NEUTROABS 11.1*  --   --   --   --   HGB 13.1 10.6* 9.9* 9.9* 10.9*  HCT 39.8 33.8* 31.5* 32.0* 34.2*  MCV 99.0 100.0 99.7 102.6* 99.7  PLT 225 204 159 146* 172   Basic Metabolic Panel:  Recent Labs Lab 08/09/17 0422 08/09/17 1321 08/10/17 0141 08/11/17 0516 08/11/17 0940 08/12/17 0419 08/14/17 1301  NA 136  --  135  --  136 136 134*  K 4.1  --  4.6  --  4.4 4.2 4.4  CL 103  --  108  --  109 110 105  CO2 19*  --  18*  --  18* 19* 23  GLUCOSE 227*  --  182*  --  211* 116* 108*  BUN 18  --  27*  --  CREATININE 1.20*  --  1.12*  --  0.80 0.56 0.56  CALCIUM 9.5  --  8.2*  --  8.0* 7.9* 8.6*  MG  --  1.5*  --  2.1  --   --   --    GFR: Estimated Creatinine Clearance: 62.2 mL/min (by C-G formula based on SCr of 0.56 mg/dL). Liver Function Tests:  Recent Labs Lab 08/09/17 0422 08/10/17 0141  AST 56* 44*  ALT 28 23  ALKPHOS 62 44  BILITOT 0.6 0.5  PROT 8.7* 7.6  ALBUMIN 3.2* 3.0*   No results for input(s): LIPASE, AMYLASE in the last 168 hours.  Recent Labs Lab 08/14/17 1450  AMMONIA 36*   Coagulation Profile: No results for input(s): INR, PROTIME in the last 168 hours. Cardiac Enzymes:  Recent Labs Lab 08/09/17 0422 08/09/17 0645 08/09/17 1321 08/09/17 1539 08/09/17 2128  TROPONINI 0.46* 0.46* 0.49* 0.48* 0.52*   BNP (last 3 results) No results for input(s): PROBNP in the last 8760 hours. HbA1C: No results for input(s): HGBA1C in the last 72 hours. CBG:  Recent Labs Lab 08/13/17 1632 08/13/17 2111 08/14/17 0753 08/14/17 1150 08/14/17 1700  GLUCAP 134* 139* 112* 123* 97   Lipid Profile: No results for  input(s): CHOL, HDL, LDLCALC, TRIG, CHOLHDL, LDLDIRECT in the last 72 hours. Thyroid Function Tests: No results for input(s): TSH, T4TOTAL, FREET4, T3FREE, THYROIDAB in the last 72 hours. Anemia Panel: No results for input(s): VITAMINB12, FOLATE, FERRITIN, TIBC, IRON, RETICCTPCT in the last 72 hours. Sepsis Labs:  Recent Labs Lab 08/09/17 0422 08/09/17 0850  LATICACIDVEN 4.7* 3.2*    Recent Results (from the past 240 hour(s))  Urine Culture  Status: Abnormal   Collection Time: 08/09/17  7:54 AM  Result Value Ref Range Status   Specimen Description URINE, RANDOM  Final   Special Requests NONE  Final   Culture MULTIPLE SPECIES PRESENT, SUGGEST RECOLLECTION (A)  Final   Report Status 08/12/2017 FINAL  Final  Culture, blood (Routine X 2) w Reflex to ID Panel     Status: None   Collection Time: 08/09/17  1:18 PM  Result Value Ref Range Status   Specimen Description BLOOD RIGHT ARM  Final   Special Requests IN PEDIATRIC BOTTLE Blood Culture adequate volume  Final   Culture   Final    NO GROWTH 5 DAYS Performed at [facility], [address] [place], [place] [address]    Report Status 08/14/2017 FINAL  Final  Culture, blood (Routine X 2) w Reflex to ID Panel     Status: None   Collection Time: 08/09/17  1:24 PM  Result Value Ref Range Status   Specimen Description BLOOD RIGHT ARM  Final   Special Requests IN PEDIATRIC BOTTLE Blood Culture adequate volume  Final   Culture  Setup Time   Final    GRAM POSITIVE RODS IN PEDIATRIC BOTTLE CRITICAL RESULT CALLED TO, READ BACK BY AND VERIFIED WITH: C. Shade Pharm.D. 15:10 08/11/17 ([email or web address])    Culture   Final    PROBABLE DERMABACTER SPECIES Performed at [facility], [address], [place], [place] [address]    Report Status 08/14/2017 FINAL  Final  Blood Culture ID Panel (Reflexed)     Status: None   Collection Time: 08/09/17  1:24 PM  Result Value Ref Range Status   Enterococcus species NOT DETECTED NOT DETECTED  Final   Listeria monocytogenes NOT DETECTED NOT DETECTED Final   Staphylococcus species NOT DETECTED NOT DETECTED Final   Staphylococcus aureus NOT DETECTED NOT DETECTED Final   Streptococcus species NOT DETECTED NOT DETECTED Final   Streptococcus agalactiae NOT DETECTED NOT DETECTED Final   Streptococcus pneumoniae NOT DETECTED NOT DETECTED Final   Streptococcus pyogenes NOT DETECTED NOT DETECTED Final   Acinetobacter baumannii NOT DETECTED NOT DETECTED Final   Enterobacteriaceae species NOT DETECTED NOT DETECTED Final   Enterobacter cloacae complex NOT DETECTED NOT DETECTED Final   Escherichia coli NOT DETECTED NOT DETECTED Final   Klebsiella oxytoca NOT DETECTED NOT DETECTED Final   Klebsiella pneumoniae NOT DETECTED NOT DETECTED Final   Proteus species NOT DETECTED NOT DETECTED Final   Serratia marcescens NOT DETECTED NOT DETECTED Final   Haemophilus influenzae NOT DETECTED NOT DETECTED Final   Neisseria meningitidis NOT DETECTED NOT DETECTED Final   Pseudomonas aeruginosa NOT DETECTED NOT DETECTED Final   Candida albicans NOT DETECTED NOT DETECTED Final   Candida glabrata NOT DETECTED NOT DETECTED Final   Candida krusei NOT DETECTED NOT DETECTED Final   Candida parapsilosis NOT DETECTED NOT DETECTED Final   Candida tropicalis NOT DETECTED NOT DETECTED Final    Comment: Performed at [facility], [address] [place], [place] [address]  MRSA PCR Screening     Status: Abnormal   Collection Time: 08/09/17  4:35 PM  Result Value Ref Range Status   MRSA by PCR POSITIVE (A) NEGATIVE Final    Comment:        The GeneXpert MRSA Assay (FDA approved for NASAL specimens only), is one component of a comprehensive MRSA colonization surveillance program. It is not intended to diagnose MRSA infection nor to guide or monitor treatment for  MRSA infections. RESULT CALLED TO, READ BACK BY AND VERIFIED WITH: M.SEARS RN [phone] [id number] [email or web address]          Radiology Studies: No  results found.      Scheduled Meds: . ARIPiprazole  2 mg Oral QHS  . aspirin EC  81 mg Oral Daily  . DULoxetine  30 mg Oral Daily  . feeding supplement  1 Container Oral TID BM  . insulin aspart  0-9 Units Subcutaneous TID WC  . insulin glargine  12 Units Subcutaneous QHS  . mupirocin ointment  1 application Nasal BID  . polyethylene glycol  17 g Oral BID  . potassium chloride  20 mEq Oral Daily  . saccharomyces boulardii  250 mg Oral BID  . senna-docusate  1 tablet Oral BID   Continuous Infusions:    LOS: 5 days    Time spent: 35 minutes.     Alba Cory, MD [facility] Pager [phone]  If 7PM-7AM, please contact night-coverage [email or web address] Password [email or web address] 08/14/2017, 5:18 PM

## 2017-08-15 ENCOUNTER — Inpatient Hospital Stay (HOSPITAL_COMMUNITY): Payer: Medicare Other

## 2017-08-15 DIAGNOSIS — L89303 Pressure ulcer of unspecified buttock, stage 3: Secondary | ICD-10-CM

## 2017-08-15 LAB — GLUCOSE, CAPILLARY
GLUCOSE-CAPILLARY: 131 mg/dL — AB (ref 65–99)
GLUCOSE-CAPILLARY: 96 mg/dL (ref 65–99)

## 2017-08-15 MED ORDER — ARIPIPRAZOLE 2 MG PO TABS: 2.0000 mg | ORAL_TABLET | Freq: Every day | ORAL | 0 refills | Status: DC

## 2017-08-15 MED ORDER — MAGNESIUM OXIDE 400 (241.3 MG) MG PO TABS: 200.0000 mg | ORAL_TABLET | Freq: Two times a day (BID) | ORAL | 0 refills | Status: AC

## 2017-08-15 MED ORDER — SENNOSIDES-DOCUSATE SODIUM 8.6-50 MG PO TABS: 1.0000 | ORAL_TABLET | Freq: Two times a day (BID) | ORAL | 0 refills | Status: AC

## 2017-08-15 MED ORDER — MEMANTINE HCL 10 MG PO TABS: 5.0000 mg | ORAL_TABLET | Freq: Two times a day (BID) | ORAL | 0 refills | Status: DC

## 2017-08-15 MED ORDER — MAGNESIUM OXIDE 400 (241.3 MG) MG PO TABS: 200.0000 mg | ORAL_TABLET | Freq: Two times a day (BID) | ORAL | Status: DC

## 2017-08-15 MED ORDER — INSULIN GLARGINE 100 UNIT/ML ~~LOC~~ SOLN: 12.0000 [IU] | Freq: Every day | SUBCUTANEOUS | 11 refills | Status: DC

## 2017-08-15 NOTE — Discharge Summary (Addendum)
Physician Discharge Summary  Adrienne [name] ZOX:[id number] DOB: 13-Jun-1936 DOA: 08/09/2017  PCP: Adrienne Brunette, MD  Admit date: 08/09/2017 Discharge date: 08/15/2017  Admitted From: SNF Disposition:  SNF  Recommendations for Outpatient Follow-up:  1. Follow up with PCP in 1-2 weeks 2. Please obtain BMP/CBC in one week 3. Patient will need EKG to follow QT, on resumption of nemanda  4. Wound care rec; Dressing procedure/placement/frequency: Foam dressing to buttocks to protect and promote healing. Aquacel to promote healing over sacrum wound.     Discharge Condition: Stable.  CODE STATUS: Full code.  Diet recommendation: Carb modified.   Brief/Interim Summary: 81 year old female with a history of anxiety, depression, diabetes, hypertension, sleep apnea, morbid obesity, resident of [name], who presented to the ED because of low blood pressure. Patient has underlying dementia and is a very poor historian and patient's daughter who is by the bedside is unable to provide any meaningful history. According to the chart patient was brought in early this morning due to low blood pressure, complains of upper abdominal pain, rectal pain. Patient's initial blood pressure was 86/36. CBG was 260. Reportedly has also had decreased oral intake, patient appears to be dehydrated clinically on exam. After receiving about 2 L of fluid patient's blood pressure has improved. Workup reveals glucose of 227, bicarbonate of 19, troponin 0.46, white blood cell count 14.1, UA negative, chest x-ray shows mild cardiomegaly with central bony vascular prominence without overt CHF, CT abdomen and pelvis shows cholelithiasis without inflammation Patient admitted for possible ACS, low blood pressure, cardiology has been consulted   Assessment & Plan:   Principal Problem:   Elevated troponin    OSA (obstructive sleep apnea)   Depression   Essential hypertension   Diabetes (HCC)   Chronic pain syndrome    Acute on chronic respiratory failure with hypoxemia (HCC)   Chest pain   Elevated troponin; demand ischemia  Per cardiology less likely ischemic event.  ECHO no wall motion abnormalities. Heparin discontinue.  EKG; prolong QT on admission. Improved.  Stress test out patient   Hypotension; resolved.  UA, chest x ray negative.  Related to dehydration. NSL hold BP medication on discharge,.    Leukocytosis; Suspect UTI one of 2 blood culture grew ; Dermabacter. Received ceftriaxone for 4 days.   urine culture. Multiples species.  Chest x ray negative for PNA.  Follow Blood culture.; Dermabacter. ID consulted. Likely contaminant.   Left Lower extremity edema, hip pain.  Check x ray. Report history of fall at home.  Doppler negative.  X ray negative for fracture.   Acute Encephalopathy; metabolic , Delirium worsening confusion per daughter.  MRI negative for stroke.   ammonia na dB 12. Normal.  Resume nemanda, lower dose. Needs follow up EKG  DM; lantus, SSI.   Abdominal pain-CT abdomen pelvis shows cirrhosis of the liver, otherwise no acute pathology No BM. Laxative ordered, suppository  Had good BM per nurse report.   Mild dementia ; , Zyprexa discontinue due to prolong QT. Psych consulted. Started on Abilify.  QT; needs follow up EKG  Will hold namenda at discharge/   Hypomagnesemia replace,.  Discharge on oral mg   Depression-continue Xanax and Cymbalta  Moisture associated skin damage bilateral buttocks, sacrum stage 3. ; decubitus sacrum  present on admission. Local care. Wound care consult  Dressing procedure/placement/frequency: Foam dressing to buttocks to protect and promote healing. Aquacel to promote healing over sacrum wound.    Discharge Diagnoses:  Principal Problem:   ACS (  acute coronary syndrome) (HCC) Active Problems:   OSA (obstructive sleep apnea)   Depression   Essential hypertension   Diabetes (HCC)   Chronic pain syndrome    Acute on chronic respiratory failure with hypoxemia (HCC)   Chest pain   Decubitus ulcer of buttock, stage 3 (HCC)    Discharge Instructions  Discharge Instructions    Diet - low sodium heart healthy    Complete by:  As directed    Increase activity slowly    Complete by:  As directed      Allergies as of 08/15/2017      Reactions   Prednisone Shortness Of Breath   Ibuprofen Other (See Comments)   Swelling mouth and lips   Meclizine Other (See Comments)   HALLUCINATIONS   Sulfonamide Derivatives    REACTION: Reaction not known      Medication List    STOP taking these medications   butalbital-acetaminophen-caffeine 50-325-40 MG tablet Commonly known as:  FIORICET, ESGIC   loperamide 2 MG capsule Commonly known as:  IMODIUM   losartan 50 MG tablet Commonly known as:  COZAAR   memantine 10 MG tablet Commonly known as:  NAMENDA   miconazole 2 % cream Commonly known as:  MICOTIN   OLANZapine 2.5 MG tablet Commonly known as:  ZYPREXA   ondansetron 8 MG tablet Commonly known as:  ZOFRAN   saccharomyces boulardii 250 MG capsule Commonly known as:  FLORASTOR   zolpidem 5 MG tablet Commonly known as:  AMBIEN     TAKE these medications   acetaminophen 500 MG tablet Commonly known as:  TYLENOL Take 500 mg by mouth every 6 (six) hours as needed for pain.   ALPRAZolam 0.25 MG tablet Commonly known as:  XANAX Take 1 tablet (0.25 mg total) by mouth at bedtime as needed for anxiety.   ARIPiprazole 2 MG tablet Commonly known as:  ABILIFY Take 1 tablet (2 mg total) by mouth at bedtime.   aspirin EC 81 MG tablet Take 81 mg by mouth daily.   bisacodyl 10 MG suppository Commonly known as:  DULCOLAX Place 10 mg rectally every 3 (three) days.   CALTRATE 600 PO Take 1 tablet by mouth 2 (two) times daily.   DERMACLOUD EX Apply 1 application topically every 2 (two) hours as needed (after each changing).   docusate sodium 100 MG capsule Commonly known as:   COLACE Take 200 mg by mouth daily.   DULoxetine 30 MG capsule Commonly known as:  CYMBALTA Take 30 mg by mouth daily.   DYMISTA 137-50 MCG/ACT Susp Generic drug:  Azelastine-Fluticasone Place 137 mcg into alternate nostrils 2 (two) times daily.   feeding supplement (PRO-STAT SUGAR FREE 64) Liqd Take 30 mLs by mouth 2 (two) times daily.   feeding supplement Liqd Take 1 Container by mouth 3 (three) times daily between meals. What changed:  Another medication with the same name was removed. Continue taking this medication, and follow the directions you see here.   fenofibrate 48 MG tablet Commonly known as:  TRICOR Take 48 mg by mouth every evening.   insulin aspart 100 UNIT/ML injection Commonly known as:  novoLOG Inject 0-9 Units into the skin 3 (three) times daily with meals.   insulin glargine 100 UNIT/ML injection Commonly known as:  LANTUS Inject 0.12 mLs (12 Units total) into the skin at bedtime.   magnesium oxide 400 (241.3 Mg) MG tablet Commonly known as:  MAG-OX Take 0.5 tablets (200 mg total) by mouth 2 (two)  times daily.   menthol-cetylpyridinium 3 MG lozenge Commonly known as:  CEPACOL Take 1 lozenge (3 mg total) by mouth as needed for sore throat.   multivitamin-iron-minerals-folic acid Tabs tablet Take 1 tablet by mouth every evening.   polyethylene glycol packet Commonly known as:  MIRALAX / GLYCOLAX Take 17 g by mouth daily as needed for mild constipation. What changed:  when to take this   potassium chloride 20 MEQ/15ML (10%) Soln Take 15 mLs (20 mEq total) by mouth daily.   pravastatin 80 MG tablet Commonly known as:  PRAVACHOL Take 80 mg by mouth every morning.   senna-docusate 8.6-50 MG tablet Commonly known as:  Senokot-S Take 1 tablet by mouth 2 (two) times daily.   solifenacin 10 MG tablet Commonly known as:  VESICARE Take 10 mg by mouth every evening.   vitamin B-12 1000 MCG tablet Commonly known as:  CYANOCOBALAMIN Take 1,000 mcg  by mouth every evening.   vitamin C 500 MG tablet Commonly known as:  ASCORBIC ACID Take 500 mg by mouth 2 (two) times daily.   VITAMIN D PO Take 4,000 tablets by mouth daily.   VOLTAREN 1 % Gel Generic drug:  diclofenac sodium Apply 2 g topically 4 (four) times daily as needed (pain).      Contact information for after-discharge care    Destination    [facility] .   Specialty:  Skilled Nursing Facility Contact information: [address] [place] [address] [phone]             Allergies  Allergen Reactions  . Prednisone Shortness Of Breath  . Ibuprofen Other (See Comments)    Swelling mouth and lips  . Meclizine Other (See Comments)    HALLUCINATIONS  . Sulfonamide Derivatives     REACTION: Reaction not known    Consultations:  Cardiology  Psych   Procedures/Studies: Dg Tibia/fibula Left  Result Date: 08/10/2017 CLINICAL DATA:  Chronic left lower leg pain after fall 2 years ago. EXAM: LEFT TIBIA AND FIBULA - 2 VIEW COMPARISON:  None. FINDINGS: Old healed distal left tibial fracture is noted. Status post surgical fixation of old distal fibular fracture. No acute fracture or dislocation is seen. No soft tissue abnormality is noted. IMPRESSION: No acute abnormality seen in the left tibia or fibula. Electronically Signed   By: Lupita Raider, M.D.   On: 08/10/2017 12:33   Mr Brain Wo Contrast  Result Date: 08/15/2017 CLINICAL DATA:  81 year old female with unexplained altered mental status. EXAM: MRI HEAD WITHOUT CONTRAST TECHNIQUE: Multiplanar, multiecho pulse sequences of the brain and surrounding structures were obtained without intravenous contrast. COMPARISON:  Brain MRI 11/03/2013 and earlier. FINDINGS: Brain: Small arachnoid cyst and/or chronic encephalomalacia in the left middle cranial fossa adjacent to the left temporal tip is stable since 2014. Along the posterior right superior temporal gyrus there is a  rounded focus of heterogeneous T2, T1 and FLAIR hyperintensity with hemosiderin (series 6, image 11 and series 10, image 12) most resembling a cavernous vascular malformation. This currently measures 10 x 9 x 11 mm and was much less apparent on the 2014 MRI (a small focus of hemosiderin only at that time). There is no associated regional mass effect or edema today. No other chronic cerebral blood products. No acute intracranial hemorrhage identified. No restricted diffusion to suggest acute infarction. No midline shift, mass effect, evidence of mass lesion. Stable cerebral volume since 2014. No ventriculomegaly. Cervicomedullary junction and pituitary are within normal limits.  Outside of the temporal lobes gray and white matter signal is stable and essentially normal for age throughout the brain. Vascular: Major intracranial vascular flow voids are preserved and stable since 2014. Skull and upper cervical spine: Degenerative ligamentous hypertrophy about the odontoid. Otherwise negative visualized cervical spine. Visualized bone marrow signal is within normal limits. Sinuses/Orbits: Stable and negative. Other: Mastoid air cells are clear. Visible internal auditory structures appear normal. Scalp and face soft tissues appear negative. IMPRESSION: 1.  No acute intracranial abnormality. 2. 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 These are slow flow vascular malformations which generally have an indolent and benign natural history. 3. Otherwise stable noncontrast MRI appearance of the brain since 2014, including small left middle cranial arachnoid cyst and/or anterior left temporal lobe encephalomalacia. Electronically Signed   By: Odessa Fleming M.D.   On: 08/15/2017 11:40   Ct Abdomen Pelvis W Contrast  Result Date: 08/09/2017 CLINICAL DATA:  Upper abdominal and rectal pain. EXAM: CT ABDOMEN AND PELVIS WITH  CONTRAST TECHNIQUE: Multidetector CT imaging of the abdomen and pelvis was performed using the standard protocol following bolus administration of intravenous contrast. CONTRAST:  80 mL of Isovue-300 intravenously. COMPARISON:  CT scan of December 22, 2016. FINDINGS: Lower chest: No acute abnormality. Hepatobiliary: Dilated gallbladder is noted with cholelithiasis, but no surrounding inflammation is noted. Stable calcification is seen in posterior segment of right hepatic lobe. Nodular hepatic contours are noted suggesting hepatic cirrhosis. Pancreas: Unremarkable. No pancreatic ductal dilatation or surrounding inflammatory changes. Spleen: Normal in size without focal abnormality. Adrenals/Urinary Tract: Adrenal glands are unremarkable. Kidneys are normal, without renal calculi, focal lesion, or hydronephrosis. Bladder is unremarkable. Stomach/Bowel: The stomach appears normal. There is no evidence of bowel obstruction or inflammation. Diverticulosis of transverse and descending colon is noted. The appendix is not visualized. Vascular/Lymphatic: Aortic atherosclerosis. No enlarged abdominal or pelvic lymph nodes. Reproductive: Status post hysterectomy. No adnexal masses. Other: No abdominal wall hernia or abnormality. No abdominopelvic ascites. Musculoskeletal: No acute or significant osseous findings. IMPRESSION: Cholelithiasis without inflammation. Nodular hepatic contours are noted suggesting hepatic cirrhosis. Diverticulosis of transverse and descending colon is noted without inflammation. Aortic atherosclerosis. Electronically Signed   By: Lupita Raider, M.D.   On: 08/09/2017 07:53   Dg Chest Port 1 View  Result Date: 08/09/2017 CLINICAL DATA:  Shortness of breath this morning. Upper abdominal pain. EXAM: PORTABLE CHEST 1 VIEW COMPARISON:  Radiograph 12/23/2016 FINDINGS: Mild cardiomegaly, similar. Minimal central vascular congestion without pulmonary edema. No consolidation, pleural fluid or  pneumothorax. No acute osseous abnormality. IMPRESSION: Stable mild cardiomegaly. Central pulmonary vascular prominence without overt CHF. Electronically Signed   By: Rubye Oaks M.D.   On: 08/09/2017 05:07   Dg Hips Bilat With Pelvis 2v  Result Date: 08/10/2017 CLINICAL DATA:  Chronic bilateral hip pain without recent injury. EXAM: DG HIP (WITH OR WITHOUT PELVIS) 2V BILAT COMPARISON:  None. FINDINGS: There is no evidence of hip fracture or dislocation. There is no evidence of arthropathy or other focal bone abnormality. IMPRESSION: Normal bilateral hips. Electronically Signed   By: Lupita Raider, M.D.   On: 08/10/2017 12:24      Subjective: denies pain, pleasantly confuse.   Discharge Exam: Vitals:   08/14/17 2000 08/15/17 0652  BP: (!) 150/76 (!) 147/67  Pulse: 85 84  Resp: 18 18  Temp: 98.5 F (36.9 C) 98.7 F (37.1 C)  SpO2:  96% 95%   Vitals:   08/14/17 0536 08/14/17 2000 08/15/17 0624 08/15/17 0652  BP: (!) 152/70 (!) 150/76  (!) 147/67  Pulse: 87 85  84  Resp: 18 18  18   Temp: 98.7 F (37.1 C) 98.5 F (36.9 C)  98.7 F (37.1 C)  TempSrc: Oral Oral  Oral  SpO2: 95% 96%  95%  Weight:   92.5 kg (203 lb 14.4 oz)   Height:        General: Pt is alert, awake, not in acute distress Cardiovascular: RRR, S1/S2 +, no rubs, no gallops Respiratory: CTA bilaterally, no wheezing, no rhonchi Abdominal: Soft, NT, ND, bowel sounds + Extremities: no edema, no cyanosis    The results of significant diagnostics from this hospitalization (including imaging, microbiology, ancillary and laboratory) are listed below for reference.     Microbiology: Recent Results (from the past 240 hour(s))  Urine Culture     Status: Abnormal   Collection Time: 08/09/17  7:54 AM  Result Value Ref Range Status   Specimen Description URINE, RANDOM  Final   Special Requests NONE  Final   Culture MULTIPLE SPECIES PRESENT, SUGGEST RECOLLECTION (A)  Final   Report Status 08/12/2017 FINAL   Final  Culture, blood (Routine X 2) w Reflex to ID Panel     Status: None   Collection Time: 08/09/17  1:18 PM  Result Value Ref Range Status   Specimen Description BLOOD RIGHT ARM  Final   Special Requests IN PEDIATRIC BOTTLE Blood Culture adequate volume  Final   Culture   Final    NO GROWTH 5 DAYS Performed at [facility], [address], [place], [place] [address]    Report Status 08/14/2017 FINAL  Final  Culture, blood (Routine X 2) w Reflex to ID Panel     Status: None   Collection Time: 08/09/17  1:24 PM  Result Value Ref Range Status   Specimen Description BLOOD RIGHT ARM  Final   Special Requests IN PEDIATRIC BOTTLE Blood Culture adequate volume  Final   Culture  Setup Time   Final    GRAM POSITIVE RODS IN PEDIATRIC BOTTLE CRITICAL RESULT CALLED TO, READ BACK BY AND VERIFIED WITH: C. Shade Pharm.D. 15:10 08/11/17 ([email or web address])    Culture   Final    PROBABLE DERMABACTER SPECIES Performed at [facility], [address] [place], [place] [address]    Report Status 08/14/2017 FINAL  Final  Blood Culture ID Panel (Reflexed)     Status: None   Collection Time: 08/09/17  1:24 PM  Result Value Ref Range Status   Enterococcus species NOT DETECTED NOT DETECTED Final   Listeria monocytogenes NOT DETECTED NOT DETECTED Final   Staphylococcus species NOT DETECTED NOT DETECTED Final   Staphylococcus aureus NOT DETECTED NOT DETECTED Final   Streptococcus species NOT DETECTED NOT DETECTED Final   Streptococcus agalactiae NOT DETECTED NOT DETECTED Final   Streptococcus pneumoniae NOT DETECTED NOT DETECTED Final   Streptococcus pyogenes NOT DETECTED NOT DETECTED Final   Acinetobacter baumannii NOT DETECTED NOT DETECTED Final   Enterobacteriaceae species NOT DETECTED NOT DETECTED Final   Enterobacter cloacae complex NOT DETECTED NOT DETECTED Final   Escherichia coli NOT DETECTED NOT DETECTED Final   Klebsiella oxytoca NOT DETECTED NOT DETECTED Final   Klebsiella pneumoniae NOT  DETECTED NOT DETECTED Final   Proteus species NOT DETECTED NOT DETECTED Final   Serratia marcescens NOT DETECTED NOT DETECTED Final   Haemophilus influenzae NOT DETECTED NOT  DETECTED Final   Neisseria meningitidis NOT DETECTED NOT DETECTED Final   Pseudomonas aeruginosa NOT DETECTED NOT DETECTED Final   Candida albicans NOT DETECTED NOT DETECTED Final   Candida glabrata NOT DETECTED NOT DETECTED Final   Candida krusei NOT DETECTED NOT DETECTED Final   Candida parapsilosis NOT DETECTED NOT DETECTED Final   Candida tropicalis NOT DETECTED NOT DETECTED Final    Comment: Performed at [facility], [address], [place], [place] [address]  MRSA PCR Screening     Status: Abnormal   Collection Time: 08/09/17  4:35 PM  Result Value Ref Range Status   MRSA by PCR POSITIVE (A) NEGATIVE Final    Comment:        The GeneXpert MRSA Assay (FDA approved for NASAL specimens only), is one component of a comprehensive MRSA colonization surveillance program. It is not intended to diagnose MRSA infection nor to guide or monitor treatment for MRSA infections. RESULT CALLED TO, READ BACK BY AND VERIFIED WITH: M.SEARS RN [phone] [id number] [email or web address]      Labs: BNP (last 3 results)  Recent Labs  12/08/16 0250  BNP 47.5   Basic Metabolic Panel:  Recent Labs Lab 08/09/17 0422 08/09/17 1321 08/10/17 0141 08/11/17 0516 08/11/17 0940 08/12/17 0419 08/14/17 1301  NA 136  --  135  --  136 136 134*  K 4.1  --  4.6  --  4.4 4.2 4.4  CL 103  --  108  --  109 110 105  CO2 19*  --  18*  --  18* 19* 23  GLUCOSE 227*  --  182*  --  211* 116* 108*  BUN 18  --  27*  --  CREATININE 1.20*  --  1.12*  --  0.80 0.56 0.56  CALCIUM 9.5  --  8.2*  --  8.0* 7.9* 8.6*  MG  --  1.5*  --  2.1  --   --   --    Liver Function Tests:  Recent Labs Lab 08/09/17 0422 08/10/17 0141  AST 56* 44*  ALT 28 23  ALKPHOS 62 44  BILITOT 0.6 0.5  PROT 8.7* 7.6  ALBUMIN 3.2* 3.0*   No results for  input(s): LIPASE, AMYLASE in the last 168 hours.  Recent Labs Lab 08/14/17 1450  AMMONIA 36*   CBC:  Recent Labs Lab 08/09/17 0422 08/10/17 0141 08/11/17 0940 08/12/17 0419 08/14/17 1301  WBC 14.1* 13.5* 8.3 8.7 8.5  NEUTROABS 11.1*  --   --   --   --   HGB 13.1 10.6* 9.9* 9.9* 10.9*  HCT 39.8 33.8* 31.5* 32.0* 34.2*  MCV 99.0 100.0 99.7 102.6* 99.7  PLT 225 204 159 146* 172   Cardiac Enzymes:  Recent Labs Lab 08/09/17 0422 08/09/17 0645 08/09/17 1321 08/09/17 1539 08/09/17 2128  TROPONINI 0.46* 0.46* 0.49* 0.48* 0.52*   BNP: Invalid input(s): POCBNP CBG:  Recent Labs Lab 08/14/17 1150 08/14/17 1700 08/14/17 2005 08/15/17 0758 08/15/17 1147  GLUCAP 123* 97 139* 131* 96   D-Dimer No results for input(s): DDIMER in the last 72 hours. Hgb A1c No results for input(s): HGBA1C in the last 72 hours. Lipid Profile No results for input(s): CHOL, HDL, LDLCALC, TRIG, CHOLHDL, LDLDIRECT in the last 72 hours. Thyroid function studies No results for input(s): TSH, T4TOTAL, T3FREE, THYROIDAB in the last 72 hours.  Invalid input(s): FREET3 Anemia work up  Recent Labs  08/14/17 1450  VITAMINB12 929*   Urinalysis  Component Value Date/Time   COLORURINE YELLOW 08/09/2017 0754   APPEARANCEUR CLOUDY (A) 08/09/2017 0754   LABSPEC 1.030 08/09/2017 0754   PHURINE 5.0 08/09/2017 0754   GLUCOSEU NEGATIVE 08/09/2017 0754   HGBUR NEGATIVE 08/09/2017 0754   BILIRUBINUR NEGATIVE 08/09/2017 0754   KETONESUR NEGATIVE 08/09/2017 0754   PROTEINUR 30 (A) 08/09/2017 0754   UROBILINOGEN 0.2 11/02/2013 1645   NITRITE NEGATIVE 08/09/2017 0754   LEUKOCYTESUR NEGATIVE 08/09/2017 0754   Sepsis Labs Invalid input(s): PROCALCITONIN,  WBC,  LACTICIDVEN Microbiology Recent Results (from the past 240 hour(s))  Urine Culture     Status: Abnormal   Collection Time: 08/09/17  7:54 AM  Result Value Ref Range Status   Specimen Description URINE, RANDOM  Final   Special Requests  NONE  Final   Culture MULTIPLE SPECIES PRESENT, SUGGEST RECOLLECTION (A)  Final   Report Status 08/12/2017 FINAL  Final  Culture, blood (Routine X 2) w Reflex to ID Panel     Status: None   Collection Time: 08/09/17  1:18 PM  Result Value Ref Range Status   Specimen Description BLOOD RIGHT ARM  Final   Special Requests IN PEDIATRIC BOTTLE Blood Culture adequate volume  Final   Culture   Final    NO GROWTH 5 DAYS Performed at [facility], [address], [place], [place] [address]    Report Status 08/14/2017 FINAL  Final  Culture, blood (Routine X 2) w Reflex to ID Panel     Status: None   Collection Time: 08/09/17  1:24 PM  Result Value Ref Range Status   Specimen Description BLOOD RIGHT ARM  Final   Special Requests IN PEDIATRIC BOTTLE Blood Culture adequate volume  Final   Culture  Setup Time   Final    GRAM POSITIVE RODS IN PEDIATRIC BOTTLE CRITICAL RESULT CALLED TO, READ BACK BY AND VERIFIED WITH: C. Shade Pharm.D. 15:10 08/11/17 ([email or web address])    Culture   Final    PROBABLE DERMABACTER SPECIES Performed at [facility], [address], [place], [place] [address]    Report Status 08/14/2017 FINAL  Final  Blood Culture ID Panel (Reflexed)     Status: None   Collection Time: 08/09/17  1:24 PM  Result Value Ref Range Status   Enterococcus species NOT DETECTED NOT DETECTED Final   Listeria monocytogenes NOT DETECTED NOT DETECTED Final   Staphylococcus species NOT DETECTED NOT DETECTED Final   Staphylococcus aureus NOT DETECTED NOT DETECTED Final   Streptococcus species NOT DETECTED NOT DETECTED Final   Streptococcus agalactiae NOT DETECTED NOT DETECTED Final   Streptococcus pneumoniae NOT DETECTED NOT DETECTED Final   Streptococcus pyogenes NOT DETECTED NOT DETECTED Final   Acinetobacter baumannii NOT DETECTED NOT DETECTED Final   Enterobacteriaceae species NOT DETECTED NOT DETECTED Final   Enterobacter cloacae complex NOT DETECTED NOT DETECTED Final   Escherichia  coli NOT DETECTED NOT DETECTED Final   Klebsiella oxytoca NOT DETECTED NOT DETECTED Final   Klebsiella pneumoniae NOT DETECTED NOT DETECTED Final   Proteus species NOT DETECTED NOT DETECTED Final   Serratia marcescens NOT DETECTED NOT DETECTED Final   Haemophilus influenzae NOT DETECTED NOT DETECTED Final   Neisseria meningitidis NOT DETECTED NOT DETECTED Final   Pseudomonas aeruginosa NOT DETECTED NOT DETECTED Final   Candida albicans NOT DETECTED NOT DETECTED Final   Candida glabrata NOT DETECTED NOT DETECTED Final   Candida krusei NOT DETECTED NOT DETECTED Final   Candida parapsilosis NOT DETECTED NOT DETECTED Final  Candida tropicalis NOT DETECTED NOT DETECTED Final    Comment: Performed at [facility], [address], [place], [place] [address]  MRSA PCR Screening     Status: Abnormal   Collection Time: 08/09/17  4:35 PM  Result Value Ref Range Status   MRSA by PCR POSITIVE (A) NEGATIVE Final    Comment:        The GeneXpert MRSA Assay (FDA approved for NASAL specimens only), is one component of a comprehensive MRSA colonization surveillance program. It is not intended to diagnose MRSA infection nor to guide or monitor treatment for MRSA infections. RESULT CALLED TO, READ BACK BY AND VERIFIED WITH: M.SEARS RN [phone] [id number] [email or web address]      Time coordinating discharge: Over 30 minutes  SIGNED:   Alba Cory, MD  [facility] 08/15/2017, 2:38 PM Pager   If 7PM-7AM, please contact night-coverage [email or web address] Password [email or web address]

## 2017-08-15 NOTE — Progress Notes (Signed)
Report called to Lamar Laundry, RN at [facility]. All questions answered.

## 2017-08-15 NOTE — NC FL2 (Signed)
[place] MEDICAID FL2 LEVEL OF CARE SCREENING TOOL     IDENTIFICATION  Patient Name: Adrienne [name] Birthdate: Jul 27, 1936 Sex: female Admission Date (Current Location): 08/09/2017  [facility] and [place] Number:  Producer, television/film/video and Address:  [facility],  [address], [place] [address]      Provider Number: [id number]  Attending Physician Name and Address:  Alba Cory, MD  Relative Name and Phone Number:       Current Level of Care: Hospital Recommended Level of Care: Skilled Nursing Facility Prior Approval Number:    Date Approved/Denied:   PASRR Number:    Discharge Plan: SNF    Current Diagnoses: Patient Active Problem List   Diagnosis Date Noted  . Decubitus ulcer of buttock, stage 3 (HCC) 08/15/2017  . ACS (acute coronary syndrome) (HCC) 08/09/2017  . Chest pain 08/09/2017  . Goals of care, counseling/discussion   . Palliative care encounter   . Postoperative ileus (HCC) 12/24/2016  . Strangulation of intestine (HCC) 12/22/2016  . Leukocytosis   . Aspiration pneumonia of left lung (HCC)   . DNR (do not resuscitate) discussion 12/19/2016  . Palliative care by specialist 12/19/2016  . Abdominal distention   . Oral phase dysphagia   . Pressure injury of skin 12/11/2016  . Ventilator dependent (HCC)   . SBO (small bowel obstruction) (HCC) 12/08/2016  . Acute on chronic respiratory failure with hypoxemia (HCC)   . Acute renal failure (HCC)   . Elevated lactic acid level   . Metabolic acidosis   . Ileus (HCC)   . Pneumonia 11/02/2013  . Syncope 11/02/2013  . Falls frequently 11/02/2013  . Anxiety state, unspecified 11/02/2013  . Depression 11/02/2013  . Unspecified hypothyroidism 11/02/2013  . Essential hypertension 11/02/2013  . HLD (hyperlipidemia) 11/02/2013  . Diabetes (HCC) 11/02/2013  . Dementia with behavioral disturbance 11/02/2013  . Chronic pain syndrome 11/02/2013  . Headache(784.0) 10/23/2013  . Postop  Hypokalemia 01/15/2013  . Postoperative anemia due to acute blood loss 01/15/2013  . Postop Transfusion 01/15/2013  . OA (osteoarthritis) of knee 04/21/2012  . Pre-operative clearance 02/08/2012  . OSA (obstructive sleep apnea) 05/03/2011    Orientation RESPIRATION BLADDER Height & Weight     Self, Place  O2 (2L) External catheter Weight: 203 lb 14.4 oz (92.5 kg) Height:   (162.6 cm)  BEHAVIORAL SYMPTOMS/MOOD NEUROLOGICAL BOWEL NUTRITION STATUS      Incontinent Diet (low sodium heart healthy)  AMBULATORY STATUS COMMUNICATION OF NEEDS Skin   Extensive Assist Verbally PU Stage and Appropriate Care  stage II pressure injury sacrum, foam dressing changes                     Personal Care Assistance Level of Assistance  Bathing, Feeding, Dressing Bathing Assistance: Maximum assistance Feeding assistance: Independent Dressing Assistance: Maximum assistance     Functional Limitations Info  Sight, Hearing, Speech Sight Info: Adequate Hearing Info: Adequate Speech Info: Adequate    SPECIAL CARE FACTORS FREQUENCY                       Contractures Contractures Info: Not present    Additional Factors Info  Code Status, Allergies, Isolation Precautions Code Status Info: full code Allergies Info: Prednisone, Ibuprofen, Meclizine, Sulfonamide Derivatives     Isolation Precautions Info: contact precautions MRSA     Current Medications (08/15/2017):  This is the current hospital active medication list Current Facility-Administered Medications  Medication Dose Route  Frequency Provider Last Rate Last Dose  . acetaminophen (TYLENOL) tablet 650 mg  650 mg Oral Q6H PRN Richarda Overlie, MD   650 mg at 08/15/17 0835   Or  . acetaminophen (TYLENOL) suppository 650 mg  650 mg Rectal Q6H PRN Richarda Overlie, MD      . ALPRAZolam Prudy Feeler) tablet 0.25 mg  0.25 mg Oral QHS PRN Richarda Overlie, MD   0.25 mg at 08/10/17 2211  . ARIPiprazole (ABILIFY) tablet 2 mg  2 mg Oral QHS  Regalado, Belkys A, MD   2 mg at 08/14/17 2153  . aspirin EC tablet 81 mg  81 mg Oral Daily Richarda Overlie, MD   81 mg at 08/15/17 0835  . DULoxetine (CYMBALTA) DR capsule 30 mg  30 mg Oral Daily Richarda Overlie, MD   30 mg at 08/15/17 0835  . feeding supplement (BOOST / RESOURCE BREEZE) liquid 1 Container  1 Container Oral TID BM Richarda Overlie, MD   1 Container at 08/15/17 0835  . fentaNYL (SUBLIMAZE) injection 25 mcg  25 mcg Intravenous Q2H PRN Richarda Overlie, MD   25 mcg at 08/10/17 0641  . insulin aspart (novoLOG) injection 0-9 Units  0-9 Units Subcutaneous TID WC Richarda Overlie, MD   1 Units at 08/15/17 0818  . insulin glargine (LANTUS) injection 12 Units  12 Units Subcutaneous QHS Richarda Overlie, MD   12 Units at 08/14/17 2157  . levalbuterol (XOPENEX) nebulizer solution 0.63 mg  0.63 mg Nebulization Q6H PRN Richarda Overlie, MD   0.63 mg at 08/12/17 0627  . magnesium oxide (MAG-OX) tablet 200 mg  200 mg Oral BID Regalado, Belkys A, MD      . polyethylene glycol (MIRALAX / GLYCOLAX) packet 17 g  17 g Oral BID Regalado, Belkys A, MD   17 g at 08/14/17 1000  . potassium chloride 20 MEQ/15ML (10%) solution 20 mEq  20 mEq Oral Daily Richarda Overlie, MD   20 mEq at 08/15/17 0835  . saccharomyces boulardii (FLORASTOR) capsule 250 mg  250 mg Oral BID Richarda Overlie, MD   250 mg at 08/15/17 0835  . senna-docusate (Senokot-S) tablet 1 tablet  1 tablet Oral BID Regalado, Belkys A, MD   1 tablet at 08/14/17 1516     Discharge Medications: Please see discharge summary for a list of discharge medications.  Relevant Imaging Results:  Relevant Lab Results:   Additional Information SS#[id number]  Nelwyn Salisbury, LCSW

## 2017-08-15 NOTE — Progress Notes (Addendum)
Pt returning to [facility] today- pt is long term care resident. Report # [phone]. Pt will transport via [facility]- completed medical necessity form and will arrange transportation. Daughter [name] aware of plan and agreeable. CSW left voicemail for facility and upon confirmation facility prepared for pt to return, will set up transportation.  Ilean Skill, MSW, LCSW Clinical Social Work 08/15/2017 [phone]  [facility] received discharge information- pt returning to room # 501. Transport arranged

## 2021-12-27 ENCOUNTER — Emergency Department (HOSPITAL_COMMUNITY): Payer: Medicare Other

## 2021-12-27 ENCOUNTER — Emergency Department (HOSPITAL_COMMUNITY)
Admission: EM | Admit: 2021-12-27 | Discharge: 2021-12-27 | Disposition: A | Discharge status: Skilled Nursing Facility | Payer: Medicare Other | Attending: Emergency Medicine | Admitting: Emergency Medicine

## 2021-12-27 ENCOUNTER — Other Ambulatory Visit: Payer: Self-pay

## 2021-12-27 DIAGNOSIS — M47816 Spondylosis without myelopathy or radiculopathy, lumbar region: Secondary | ICD-10-CM | POA: Insufficient documentation

## 2021-12-27 DIAGNOSIS — R140 Abdominal distension (gaseous): Secondary | ICD-10-CM | POA: Diagnosis not present

## 2021-12-27 DIAGNOSIS — I517 Cardiomegaly: Secondary | ICD-10-CM | POA: Diagnosis not present

## 2021-12-27 DIAGNOSIS — R410 Disorientation, unspecified: Secondary | ICD-10-CM | POA: Insufficient documentation

## 2021-12-27 DIAGNOSIS — L89159 Pressure ulcer of sacral region, unspecified stage: Secondary | ICD-10-CM | POA: Insufficient documentation

## 2021-12-27 DIAGNOSIS — W06XXXA Fall from bed, initial encounter: Secondary | ICD-10-CM | POA: Insufficient documentation

## 2021-12-27 DIAGNOSIS — F0390 Unspecified dementia without behavioral disturbance: Secondary | ICD-10-CM | POA: Diagnosis not present

## 2021-12-27 DIAGNOSIS — Y92129 Unspecified place in nursing home as the place of occurrence of the external cause: Secondary | ICD-10-CM | POA: Diagnosis not present

## 2021-12-27 DIAGNOSIS — S0083XA Contusion of other part of head, initial encounter: Secondary | ICD-10-CM | POA: Insufficient documentation

## 2021-12-27 DIAGNOSIS — S0990XA Unspecified injury of head, initial encounter: Secondary | ICD-10-CM | POA: Diagnosis not present

## 2021-12-27 DIAGNOSIS — W19XXXA Unspecified fall, initial encounter: Secondary | ICD-10-CM

## 2021-12-27 DIAGNOSIS — K5730 Diverticulosis of large intestine without perforation or abscess without bleeding: Secondary | ICD-10-CM | POA: Diagnosis not present

## 2021-12-27 LAB — CBC WITH DIFFERENTIAL/PLATELET
Abs Immature Granulocytes: 0.02 10*3/uL (ref 0.00–0.07)
Basophils Absolute: 0 10*3/uL (ref 0.0–0.1)
Basophils Relative: 1 %
Eosinophils Absolute: 0.1 10*3/uL (ref 0.0–0.5)
Eosinophils Relative: 1 %
HCT: 34.4 % — ABNORMAL LOW (ref 36.0–46.0)
Hemoglobin: 10.7 g/dL — ABNORMAL LOW (ref 12.0–15.0)
Immature Granulocytes: 0 %
Lymphocytes Relative: 11 %
Lymphs Abs: 0.9 10*3/uL (ref 0.7–4.0)
MCH: 31.7 pg (ref 26.0–34.0)
MCHC: 31.1 g/dL (ref 30.0–36.0)
MCV: 101.8 fL — ABNORMAL HIGH (ref 80.0–100.0)
Monocytes Absolute: 0.3 10*3/uL (ref 0.1–1.0)
Monocytes Relative: 3 %
Neutro Abs: 7.2 10*3/uL (ref 1.7–7.7)
Neutrophils Relative %: 84 %
Platelets: 196 10*3/uL (ref 150–400)
RBC: 3.38 MIL/uL — ABNORMAL LOW (ref 3.87–5.11)
RDW: 16.6 % — ABNORMAL HIGH (ref 11.5–15.5)
WBC: 8.6 10*3/uL (ref 4.0–10.5)
nRBC: 0 % (ref 0.0–0.2)

## 2021-12-27 LAB — BASIC METABOLIC PANEL
Anion gap: 8 (ref 5–15)
BUN: 13 mg/dL (ref 8–23)
CO2: 23 mmol/L (ref 22–32)
Calcium: 8 mg/dL — ABNORMAL LOW (ref 8.9–10.3)
Chloride: 105 mmol/L (ref 98–111)
Creatinine, Ser: 0.5 mg/dL (ref 0.44–1.00)
GFR, Estimated: >60 mL/min (ref >60)
Glucose, Bld: 125 mg/dL — ABNORMAL HIGH (ref 70–99)
Potassium: 4.5 mmol/L (ref 3.5–5.1)
Sodium: 136 mmol/L (ref 135–145)

## 2021-12-27 MED ORDER — NYSTATIN 100000 UNIT/GM EX POWD
Freq: Once | CUTANEOUS | Status: DC
  Filled 2021-12-27: qty 15

## 2021-12-27 NOTE — ED Notes (Signed)
All pt documents including MOST form sent with pt.

## 2021-12-27 NOTE — ED Notes (Signed)
Called [facility] for pt no eta given  °

## 2021-12-27 NOTE — ED Triage Notes (Signed)
Pt here by EMS from [place] d/t possible fall from bed. Pt found by staff this am. Unknown downtime. Confused at baseline. Oriented to self and place at this time. Hematoma noted right forehead with small abrasion. Pt has picline right upper arm.

## 2021-12-27 NOTE — ED Notes (Signed)
Attempted to contact facility. Unable to speak to anyone.

## 2021-12-27 NOTE — Discharge Instructions (Addendum)
You were seen in the emergency department for evaluation of injuries from a fall.  You had a CAT scan of your head and neck along with CAT scan of your lumbar spine.  They did see some old compression fractures but no acute fracture.  You had some thickening of the rectal wall that will need follow-up.  You also had significant pressure injury over your sacrum that will need padding and barrier protection.  Consider wound consult.

## 2021-12-27 NOTE — ED Provider Notes (Signed)
[facility] Provider [facility]   CSN: [id number] Arrival date & time: 12/27/21  1023     History  Chief Complaint  Patient presents with   Elvera LennoxFall    Adrienne [name] is a 86 y.o. female.  Level 5 caveat secondary to dementia.  Patient brought in from her facility after [facility] after an unwitnessed fall found by staff on the floor next to her bed.  Patient does not recall the fall and does not have any complaints.  Obvious bruise abrasion right temporal area.  The history is provided by the patient and the EMS personnel.  Fall This is a new problem. The problem has not changed since onset.Pertinent negatives include no chest pain, no abdominal pain, no headaches and no shortness of breath. Nothing aggravates the symptoms. Nothing relieves the symptoms. She has tried nothing for the symptoms. The treatment provided no relief.      Home Medications Prior to Admission medications   Medication Sig Start Date End Date Taking? Authorizing Provider  acetaminophen (TYLENOL) 500 MG tablet Take 500 mg by mouth every 6 (six) hours as needed for pain.    [provider]  ALPRAZolam Prudy Feeler(XANAX) 0.25 MG tablet Take 1 tablet (0.25 mg total) by mouth at bedtime as needed for anxiety. Patient not taking: Reported on 08/09/2017 01/02/17   Maxie BarbBhandari, Dron Prasad, MD  Amino Acids-Protein Hydrolys (FEEDING SUPPLEMENT, PRO-STAT SUGAR FREE 64,) LIQD Take 30 mLs by mouth 2 (two) times daily.    [provider]  ARIPiprazole (ABILIFY) 2 MG tablet Take 1 tablet (2 mg total) by mouth at bedtime. 08/15/17   Regalado, Belkys A, MD  aspirin EC 81 MG tablet Take 81 mg by mouth daily.    [provider]  bisacodyl (DULCOLAX) 10 MG suppository Place 10 mg rectally every 3 (three) days.    [provider]  Calcium Carbonate (CALTRATE 600 PO) Take 1 tablet by mouth 2 (two) times daily.    [provider]  Cholecalciferol (VITAMIN D PO) Take  4,000 tablets by mouth daily.     [provider]  diclofenac sodium (VOLTAREN) 1 % GEL Apply 2 g topically 4 (four) times daily as needed (pain).    [provider]  docusate sodium (COLACE) 100 MG capsule Take 200 mg by mouth daily.    [provider]  DULoxetine (CYMBALTA) 30 MG capsule Take 30 mg by mouth daily.    [provider]  DYMISTA 137-50 MCG/ACT SUSP Place 137 mcg into alternate nostrils 2 (two) times daily.  10/15/13   [provider]  feeding supplement (BOOST / RESOURCE BREEZE) LIQD Take 1 Container by mouth 3 (three) times daily between meals. Patient not taking: Reported on 08/09/2017 12/21/16   Maxie BarbBhandari, Dron Prasad, MD  fenofibrate (TRICOR) 48 MG tablet Take 48 mg by mouth every evening.    [provider]  Infant Care Products [facility](DERMACLOUD EX) Apply 1 application topically every 2 (two) hours as needed (after each changing).    [provider]  insulin aspart (NOVOLOG) 100 UNIT/ML injection Inject 0-9 Units into the skin 3 (three) times daily with meals. Patient not taking: Reported on 08/09/2017 01/02/17   Maxie BarbBhandari, Dron Prasad, MD  insulin glargine (LANTUS) 100 UNIT/ML injection Inject 0.12 mLs (12 Units total) into the skin at bedtime. 08/15/17   Regalado, Belkys A, MD  magnesium oxide (MAG-OX) 400 (241.3 Mg) MG tablet Take 0.5 tablets (200 mg total) by mouth 2 (two) times daily.  08/15/17   Regalado, Belkys A, MD  menthol-cetylpyridinium (CEPACOL) 3 MG lozenge Take 1 lozenge (3 mg total) by mouth as needed for sore throat. 01/02/17   Maxie Barb, MD  multivitamin-iron-minerals-folic acid  [facility]) TABS tablet Take 1 tablet by mouth every evening.    [provider]  polyethylene glycol (MIRALAX / GLYCOLAX) packet Take 17 g by mouth daily as needed for mild constipation. Patient taking differently: Take 17 g by mouth daily.  12/21/16   Maxie Barb, MD  potassium chloride 20 MEQ/15ML (10%)  SOLN Take 15 mLs (20 mEq total) by mouth daily. Patient not taking: Reported on 08/09/2017 12/22/16   Maxie Barb, MD  pravastatin (PRAVACHOL) 80 MG tablet Take 80 mg by mouth every morning.    [provider]  senna-docusate (SENOKOT-S) 8.6-50 MG tablet Take 1 tablet by mouth 2 (two) times daily. 08/15/17   Regalado, Belkys A, MD  solifenacin (VESICARE) 10 MG tablet Take 10 mg by mouth every evening.    [provider]  vitamin B-12 (CYANOCOBALAMIN) 1000 MCG tablet Take 1,000 mcg by mouth every evening.    [provider]  vitamin C (ASCORBIC ACID) 500 MG tablet Take 500 mg by mouth 2 (two) times daily.    [provider]      Allergies    Prednisone, Ibuprofen, Meclizine, and Sulfonamide derivatives    Review of Systems   Review of Systems  Unable to perform ROS: Dementia  Respiratory:  Negative for shortness of breath.   Cardiovascular:  Negative for chest pain.  Gastrointestinal:  Negative for abdominal pain.  Neurological:  Negative for headaches.   Physical Exam Updated Vital Signs BP (!) 141/54 (BP Location: Left Arm)    Pulse 83    Temp 98.2 F (36.8 C) (Oral)    Resp (!) 21    Ht 5\' 2"  (1.575 m)    Wt 95.3 kg    SpO2 99%    BMI 38.41 kg/m  Physical Exam Vitals and nursing note reviewed.  Constitutional:      General: She is not in acute distress.    Appearance: Normal appearance. She is well-developed. She is obese.  HENT:     Head: Normocephalic.     Comments: Abrasion right temple Eyes:     Conjunctiva/sclera: Conjunctivae normal.  Cardiovascular:     Rate and Rhythm: Normal rate and regular rhythm.     Heart sounds: No murmur heard. Pulmonary:     Effort: Pulmonary effort is normal. No respiratory distress.     Breath sounds: Normal breath sounds.  Abdominal:     General: There is distension.     Palpations: Abdomen is soft.     Tenderness: There is no abdominal tenderness. There is no guarding or rebound.   Genitourinary:    Comments: Patient has a lot of yeast and erythema in her groin and perineal area.  She has sacral erythema and a tracking wound Musculoskeletal:        General: No swelling, tenderness or deformity.     Cervical back: Neck supple.     Comments: Patient is PICC line right upper arm.  She has normal range of motion of her extremities.  Lower extremities with padded heel support.  Limited range of motion although no specific pain.  Skin:    General: Skin is warm and dry.     Capillary Refill: Capillary refill takes less than 2 seconds.  Neurological:     General: No focal  deficit present.     Mental Status: She is alert. She is disoriented.  Psychiatric:        Mood and Affect: Mood normal.    ED Results / Procedures / Treatments   Labs (all labs ordered are listed, but only abnormal results are displayed) Labs Reviewed  BASIC METABOLIC PANEL - Abnormal; Notable for the following components:      Result Value   Glucose, Bld 125 (*)    Calcium 8.0 (*)    All other components within normal limits  CBC WITH DIFFERENTIAL/PLATELET - Abnormal; Notable for the following components:   RBC 3.38 (*)    Hemoglobin 10.7 (*)    HCT 34.4 (*)    MCV 101.8 (*)    RDW 16.6 (*)    All other components within normal limits    EKG EKG Interpretation  Date/Time:  Sunday December 27 2021 10:52:24 EST Ventricular Rate:  79 PR Interval:  165 QRS Duration: 147 QT Interval:  434 QTC Calculation: 498 R Axis:   53 Text Interpretation: Sinus rhythm Right bundle branch block RBBB new from prior 10/18 Confirmed by Meridee Score [id number]) on 12/27/2021 10:56:00 AM  Radiology CT Head Wo Contrast  Result Date: 12/27/2021 CLINICAL DATA:  Fall, head, neck, and back pain EXAM: CT HEAD WITHOUT CONTRAST CT CERVICAL SPINE WITHOUT CONTRAST TECHNIQUE: Multidetector CT imaging of the head and cervical spine was performed following the standard protocol without intravenous contrast. Multiplanar  CT image reconstructions of the cervical spine were also generated. RADIATION DOSE REDUCTION: This exam was performed according to the departmental dose-optimization program which includes automated exposure control, adjustment of the mA and/or kV according to patient size and/or use of iterative reconstruction technique. COMPARISON:  None. FINDINGS: CT HEAD FINDINGS Brain: No evidence of acute infarction, hemorrhage, hydrocephalus, extra-axial collection or mass lesion/mass effect. Periventricular and deep white matter hypodensity. Moderate global cerebral volume loss. Vascular: No hyperdense vessel or unexpected calcification. Skull: Normal. Negative for fracture or focal lesion. Sinuses/Orbits: No acute finding. Other: Soft tissue contusion and hematoma of the right forehead (series 3, image 15). CT CERVICAL SPINE FINDINGS Alignment: Normal. Skull base and vertebrae: No acute fracture. No primary bone lesion or focal pathologic process. Soft tissues and spinal canal: No prevertebral fluid or swelling. No visible canal hematoma. Disc levels: Mild multilevel disc space height loss and osteophytosis throughout. Upper chest: Negative. Other: None. IMPRESSION: 1. No acute intracranial pathology. 2. Small-vessel white matter disease and moderate global cerebral volume loss. 3. Soft tissue contusion and hematoma of the right forehead. 4. No fracture or static subluxation of the cervical spine. Electronically Signed   By: Jearld Lesch M.D.   On: 12/27/2021 12:21   CT Cervical Spine Wo Contrast  Result Date: 12/27/2021 CLINICAL DATA:  Fall, head, neck, and back pain EXAM: CT HEAD WITHOUT CONTRAST CT CERVICAL SPINE WITHOUT CONTRAST TECHNIQUE: Multidetector CT imaging of the head and cervical spine was performed following the standard protocol without intravenous contrast. Multiplanar CT image reconstructions of the cervical spine were also generated. RADIATION DOSE REDUCTION: This exam was performed according to the  departmental dose-optimization program which includes automated exposure control, adjustment of the mA and/or kV according to patient size and/or use of iterative reconstruction technique. COMPARISON:  None. FINDINGS: CT HEAD FINDINGS Brain: No evidence of acute infarction, hemorrhage, hydrocephalus, extra-axial collection or mass lesion/mass effect. Periventricular and deep white matter hypodensity. Moderate global cerebral volume loss. Vascular: No hyperdense vessel or unexpected calcification. Skull: Normal. Negative for  fracture or focal lesion. Sinuses/Orbits: No acute finding. Other: Soft tissue contusion and hematoma of the right forehead (series 3, image 15). CT CERVICAL SPINE FINDINGS Alignment: Normal. Skull base and vertebrae: No acute fracture. No primary bone lesion or focal pathologic process. Soft tissues and spinal canal: No prevertebral fluid or swelling. No visible canal hematoma. Disc levels: Mild multilevel disc space height loss and osteophytosis throughout. Upper chest: Negative. Other: None. IMPRESSION: 1. No acute intracranial pathology. 2. Small-vessel white matter disease and moderate global cerebral volume loss. 3. Soft tissue contusion and hematoma of the right forehead. 4. No fracture or static subluxation of the cervical spine. Electronically Signed   By: Jearld LeschAlex D Bibbey M.D.   On: 12/27/2021 12:21   CT Lumbar Spine Wo Contrast  Result Date: 12/27/2021 CLINICAL DATA:  Fall, back pain EXAM: CT LUMBAR SPINE WITHOUT CONTRAST TECHNIQUE: Multidetector CT imaging of the lumbar spine was performed without intravenous contrast administration. Multiplanar CT image reconstructions were also generated. RADIATION DOSE REDUCTION: This exam was performed according to the departmental dose-optimization program which includes automated exposure control, adjustment of the mA and/or kV according to patient size and/or use of iterative reconstruction technique. COMPARISON:  08/09/2017 FINDINGS:  Segmentation: 5 lumbar type vertebrae. Alignment: Lumbar levocurvature.  No static listhesis. Vertebrae: Diffuse osseous demineralization. Chronic superior endplate compression deformity of L5. Chronic superior endplate compression deformity of L2 with associated Schmorl's node. L1 and L2 vertebral bodies are partially fused. Chronic mild superior endplate compression deformity with Schmorl's node at T11. No evidence of a new or acute fracture involving the lumbar spine. Paraspinal and other soft tissues: Circumferential rectal wall thickening. Extensive colonic diverticulosis. Small amount of free fluid is seen within the visualized abdomen and pelvis. Visualized liver demonstrates nodular surface contour suggestive of cirrhosis. Extensive atherosclerotic calcifications throughout the visualized aortoiliac axis. Disc levels: Multilevel lumbar spondylosis with bulky bilateral facet arthropathy. Redemonstration of a large partially calcified central disc protrusion at the L2-3 level likely resulting in severe canal stenosis (series 8, image 65). Multiple levels of foraminal narrowing bilaterally, which appear most pronounced at L1-2 and L2-3. IMPRESSION: 1. No evidence of a new or acute fracture involving the lumbar spine. 2. Chronic superior endplate compression deformities of T11, L2, and L5. 3. Multilevel lumbar spondylosis with bulky bilateral facet arthropathy. Redemonstration of a large partially calcified central disc protrusion at the L2-3 level likely resulting in severe canal stenosis. 4. Circumferential rectal wall thickening, which may represent proctitis. 5. Visualized liver demonstrates nodular surface contour suggestive of cirrhosis. 6. Small amount of free fluid within the visualized abdomen and pelvis, which may be secondary to underlying liver disease. 7. Colonic diverticulosis. 8. Aortic Atherosclerosis (ICD10-I70.0). Electronically Signed   By: Duanne GuessNicholas  Plundo D.O.   On: 12/27/2021 12:00   DG  Chest Port 1 View  Result Date: 12/27/2021 CLINICAL DATA:  Fall EXAM: PORTABLE CHEST 1 VIEW COMPARISON:  08/09/2017 FINDINGS: Cardiomegaly. Right upper extremity PICC. Both lungs are clear. The visualized skeletal structures are unremarkable. IMPRESSION: 1. Cardiomegaly without acute abnormality of the lungs in AP portable projection. 2. Right upper extremity PICC. Electronically Signed   By: Jearld LeschAlex D Bibbey M.D.   On: 12/27/2021 11:11    Procedures Procedures    Medications Ordered in ED Medications - No data to display  ED Course/ Medical Decision Making/ A&P Clinical Course as of 12/27/21 1720  Sun Dec 27, 2021  1051 Chest x-ray does not show any gross infiltrates or pneumothorax.  Awaiting radiology reading. [MB]  65 Patient's family member is here and I updated her on the results of her testing. [MB]    Clinical Course User Index [MB] Terrilee Files, MD                           Medical Decision Making Amount and/or Complexity of Data Reviewed Labs: ordered. Radiology: ordered.  Risk Prescription drug management.  This patient complains of fall; this involves an extensive number of treatment Options and is a complaint that carries with it a high risk of complications and Morbidity. The differential includes intracranial injury, skull fracture, cervical fracture, metabolic derangement, anemia, infection  I ordered, reviewed and interpreted labs, which included CBC with normal white count, hemoglobin low stable from priors, chemistries fairly unremarkable, I ordered medication nystatin topical powder for patient's Candida I ordered imaging studies which included chest x-ray, CT head cervical spine CT lumbar spine and I independently    visualized and interpreted imaging which showed mostly chronic findings.  Patient does have some rectal thickening consistent with proctitis, compression fractures. Additional history obtained from EMS and patient's family member Previous  records obtained and reviewed in epic, no recent admissions After the interventions stated above, I reevaluated the patient and found patient to be hemodynamically stable.  Family member states she is at her baseline cognitively.  Made recommendations for better wound care for her pressure injury on her sacrum and perineum.  Return instructions discussed           Final Clinical Impression(s) / ED Diagnoses Final diagnoses:  Fall, initial encounter  Injury of head, initial encounter  Pressure injury of skin of sacral region, unspecified injury stage    Rx / DC Orders ED Discharge Orders     None         Terrilee Files, MD 12/27/21 1724

## 2022-01-17 ENCOUNTER — Emergency Department (HOSPITAL_COMMUNITY)
Admission: EM | Admit: 2022-01-17 | Discharge: 2022-01-18 | Disposition: A | Discharge status: Home / Self Care | Payer: Medicare Other | Attending: Emergency Medicine | Admitting: Emergency Medicine

## 2022-01-17 ENCOUNTER — Other Ambulatory Visit: Payer: Self-pay

## 2022-01-17 ENCOUNTER — Encounter (HOSPITAL_COMMUNITY): Payer: Self-pay | Admitting: Emergency Medicine

## 2022-01-17 DIAGNOSIS — E119 Type 2 diabetes mellitus without complications: Secondary | ICD-10-CM | POA: Diagnosis not present

## 2022-01-17 DIAGNOSIS — D649 Anemia, unspecified: Secondary | ICD-10-CM | POA: Diagnosis not present

## 2022-01-17 DIAGNOSIS — I10 Essential (primary) hypertension: Secondary | ICD-10-CM | POA: Insufficient documentation

## 2022-01-17 DIAGNOSIS — K6289 Other specified diseases of anus and rectum: Secondary | ICD-10-CM | POA: Insufficient documentation

## 2022-01-17 DIAGNOSIS — Z794 Long term (current) use of insulin: Secondary | ICD-10-CM | POA: Insufficient documentation

## 2022-01-17 DIAGNOSIS — Z79899 Other long term (current) drug therapy: Secondary | ICD-10-CM | POA: Insufficient documentation

## 2022-01-17 DIAGNOSIS — R7402 Elevation of levels of lactic acid dehydrogenase (LDH): Secondary | ICD-10-CM | POA: Diagnosis not present

## 2022-01-17 DIAGNOSIS — R188 Other ascites: Secondary | ICD-10-CM | POA: Insufficient documentation

## 2022-01-17 DIAGNOSIS — R109 Unspecified abdominal pain: Secondary | ICD-10-CM | POA: Diagnosis not present

## 2022-01-17 DIAGNOSIS — R140 Abdominal distension (gaseous): Secondary | ICD-10-CM | POA: Insufficient documentation

## 2022-01-17 DIAGNOSIS — R011 Cardiac murmur, unspecified: Secondary | ICD-10-CM | POA: Diagnosis not present

## 2022-01-17 LAB — CBC WITH DIFFERENTIAL/PLATELET
Abs Immature Granulocytes: 0.01 10*3/uL (ref 0.00–0.07)
Basophils Absolute: 0 10*3/uL (ref 0.0–0.1)
Basophils Relative: 0 %
Eosinophils Absolute: 0.1 10*3/uL (ref 0.0–0.5)
Eosinophils Relative: 2 %
HCT: 36.1 % (ref 36.0–46.0)
Hemoglobin: 11.2 g/dL — ABNORMAL LOW (ref 12.0–15.0)
Immature Granulocytes: 0 %
Lymphocytes Relative: 19 %
Lymphs Abs: 1.1 10*3/uL (ref 0.7–4.0)
MCH: 32 pg (ref 26.0–34.0)
MCHC: 31 g/dL (ref 30.0–36.0)
MCV: 103.1 fL — ABNORMAL HIGH (ref 80.0–100.0)
Monocytes Absolute: 0.2 10*3/uL (ref 0.1–1.0)
Monocytes Relative: 4 %
Neutro Abs: 4.3 10*3/uL (ref 1.7–7.7)
Neutrophils Relative %: 75 %
Platelets: 185 10*3/uL (ref 150–400)
RBC: 3.5 MIL/uL — ABNORMAL LOW (ref 3.87–5.11)
RDW: 16.4 % — ABNORMAL HIGH (ref 11.5–15.5)
WBC: 5.8 10*3/uL (ref 4.0–10.5)
nRBC: 0 % (ref 0.0–0.2)

## 2022-01-17 LAB — COMPREHENSIVE METABOLIC PANEL
ALT: 16 U/L (ref 0–44)
AST: 35 U/L (ref 15–41)
Albumin: 2 g/dL — ABNORMAL LOW (ref 3.5–5.0)
Alkaline Phosphatase: 68 U/L (ref 38–126)
Anion gap: 5 (ref 5–15)
BUN: 12 mg/dL (ref 8–23)
CO2: 29 mmol/L (ref 22–32)
Calcium: 8.3 mg/dL — ABNORMAL LOW (ref 8.9–10.3)
Chloride: 100 mmol/L (ref 98–111)
Creatinine, Ser: 0.56 mg/dL (ref 0.44–1.00)
GFR, Estimated: >60 mL/min (ref >60)
Glucose, Bld: 95 mg/dL (ref 70–99)
Potassium: 4.5 mmol/L (ref 3.5–5.1)
Sodium: 134 mmol/L — ABNORMAL LOW (ref 135–145)
Total Bilirubin: 0.4 mg/dL (ref 0.3–1.2)
Total Protein: 6.7 g/dL (ref 6.5–8.1)

## 2022-01-17 LAB — LACTIC ACID, PLASMA: Lactic Acid, Venous: 2.5 mmol/L (ref 0.5–1.9)

## 2022-01-17 LAB — LIPASE, BLOOD: Lipase: 33 U/L (ref 11–51)

## 2022-01-17 MED ORDER — SODIUM CHLORIDE 0.9 % IV BOLUS
500.0000 mL | Freq: Once | INTRAVENOUS | Status: AC
  Administered 2022-01-17: 500 mL via INTRAVENOUS

## 2022-01-17 MED ORDER — FENTANYL CITRATE PF 50 MCG/ML IJ SOSY
50.0000 ug | PREFILLED_SYRINGE | Freq: Once | INTRAMUSCULAR | Status: AC
  Administered 2022-01-17: 50 ug via INTRAVENOUS
  Filled 2022-01-17: qty 1

## 2022-01-17 MED ORDER — ONDANSETRON HCL 4 MG/2ML IJ SOLN
4.0000 mg | Freq: Once | INTRAMUSCULAR | Status: AC
  Administered 2022-01-17: 4 mg via INTRAVENOUS
  Filled 2022-01-17: qty 2

## 2022-01-17 NOTE — ED Triage Notes (Signed)
Abdominal pain, EMS reports abdomen is distended and rigid.  Staff nurse reported that this patient was not hers, but the note in the chart reported it started a week ago, patient reports it started a month ago.  Patient's last BM was yesterday. ? ?196/102 ?80 ?18 ?97% ?118 CBG ?

## 2022-01-17 NOTE — ED Provider Notes (Signed)
[facility] DEPT Provider Note   CSN: [id number] Arrival date & time: 01/17/22  2209     History  Chief Complaint  Patient presents with   Abdominal Pain    Adrienne [name] is a 86 y.o. female.  86 y/o female with history of hypertension, hyperlipidemia, diabetes, hypothyroid presents to the emergency department via EMS from [place].  Patient presently complaining of soreness in her back and rectum.  She reports that she has been feeling this way for "a while".  Notes some associated nausea, but denies vomiting.  She is unable to recall her last bowel movement, but the facility reports that it was yesterday.  Patient cannot expand on how long her abdomen has been distended; she believes it was about a month ago while facility reports progressive symptoms over the past week.  Abdominal SHx notable for abdominal hysterectomy with bilateral oophorectomy, umbilical hernia repair, laparotomy, lysis of adhesions  The history is provided by the patient. No language interpreter was used.  Abdominal Pain     Home Medications Prior to Admission medications   Medication Sig Start Date End Date Taking? Authorizing Provider  acetaminophen (TYLENOL) 325 MG tablet Take 650 mg by mouth every 6 (six) hours as needed for mild pain.   Yes [provider]  ALPRAZolam (XANAX) 0.25 MG tablet Take 0.25 mg by mouth 2 (two) times daily as needed for anxiety. 01/05/22  Yes [provider]  Amino Acids-Protein Hydrolys (FEEDING SUPPLEMENT, PRO-STAT SUGAR FREE 64,) LIQD Take 30 mLs by mouth 2 (two) times daily.   Yes [provider]  amoxicillin-clavulanate (AUGMENTIN) 875-125 MG tablet Take 1 tablet by mouth every 12 (twelve) hours. 01/18/22  Yes Antonietta Breach, PA-C  bisacodyl (DULCOLAX) 10 MG suppository Place 10 mg rectally every 3 (three) days.   Yes [provider]  Calcium Carbonate (CALTRATE 600 PO) Take 600 mg by mouth  2 (two) times daily.   Yes [provider]  ceFEPIme (MAXIPIME) 1-5 GM-%(50ML) SOLR Inject 1 g into the vein every 12 (twelve) hours. for 1 week   Yes [provider]  cholecalciferol (VITAMIN D3) 25 MCG (1000 UNIT) tablet Take 4,000 Units by mouth daily.   Yes [provider]  divalproex (DEPAKOTE) 125 MG DR tablet Take 125 mg by mouth 2 (two) times daily. At 8am and 5 pm   Yes [provider]  docusate sodium (COLACE) 100 MG capsule Take 200 mg by mouth daily.   Yes [provider]  DULoxetine HCl 40 MG CPEP Take 40 mg by mouth daily. 10/11/21  Yes [provider]  fenofibrate (TRICOR) 48 MG tablet Take 48 mg by mouth every evening.   Yes [provider]  furosemide (LASIX) 20 MG tablet Take 20 mg by mouth daily. for 5 days   Yes [provider]  gabapentin (NEURONTIN) 100 MG capsule Take 100 mg by mouth at bedtime. 12/01/21  Yes [provider]  [place] (DERMACLOUD EX) Apply 1 application topically in the morning and at bedtime.   Yes [provider]  insulin aspart (NOVOLOG) 100 UNIT/ML injection Inject 0-9 Units into the skin 3 (three) times daily with meals. Patient taking differently: Inject 2-11 Units into the skin in the morning, at noon, and at bedtime. Per sliding scale 01/02/17  Yes Rosita Fire, MD  linaclotide [facility]) 72 MCG capsule Take 72 mcg by mouth at bedtime.   Yes [provider]  magnesium oxide (MAG-OX)  400 (241.3 Mg) MG tablet Take 0.5 tablets (200 mg total) by mouth 2 (two) times daily. Patient taking differently: Take 400 mg by mouth daily. 08/15/17  Yes Regalado, Belkys A, MD  Multiple Vitamins-Minerals (CENTRUM SILVER PO) Take 1 tablet by mouth daily.   Yes [provider]  multivitamin-iron-minerals-folic acid (THERAPEUTIC-M) TABS tablet Take 1 tablet by mouth daily.   Yes [provider]  potassium chloride (MICRO-K) 10 MEQ CR capsule  Take 10 mEq by mouth daily.   Yes [provider]  pravastatin (PRAVACHOL) 80 MG tablet Take 80 mg by mouth every evening.   Yes [provider]  senna-docusate (SENOKOT-S) 8.6-50 MG tablet Take 1 tablet by mouth 2 (two) times daily. 08/15/17  Yes Regalado, Belkys A, MD  traMADol (ULTRAM) 50 MG tablet Take 50 mg by mouth every 8 (eight) hours as needed for moderate pain.   Yes [provider]  vitamin B-12 (CYANOCOBALAMIN) 1000 MCG tablet Take 1,000 mcg by mouth every evening.   Yes [provider]  vitamin C (ASCORBIC ACID) 500 MG tablet Take 500 mg by mouth 2 (two) times daily.   Yes [provider]  Vancomycin HCl in NaCl 1-0.9 GM/200ML-% SOLN Inject 1,000 mg into the vein daily. Patient not taking: Reported on 01/18/2022    [provider]      Allergies    Prednisone, Ibuprofen, Meclizine, and Sulfonamide derivatives    Review of Systems   Review of Systems  Gastrointestinal:  Positive for abdominal pain.  Ten systems reviewed and are negative for acute change, except as noted in the HPI.    Physical Exam Updated Vital Signs BP 128/90    Pulse (!) 110    Temp 98.2 F (36.8 C) (Oral)    Resp 20    SpO2 95%   Physical Exam Vitals and nursing note reviewed. Exam conducted with a chaperone present.  Constitutional:      General: She is not in acute distress.    Appearance: She is well-developed. She is not diaphoretic.     Comments: Obese, nontoxic appearing female.  HENT:     Head: Normocephalic and atraumatic.     Mouth/Throat:     Mouth: Mucous membranes are dry.  Eyes:     General: No scleral icterus.    Conjunctiva/sclera: Conjunctivae normal.  Cardiovascular:     Rate and Rhythm: Normal rate and regular rhythm.     Pulses: Normal pulses.     Heart sounds: Murmur heard.  Pulmonary:     Effort: Pulmonary effort is normal. No respiratory distress.     Breath sounds: No stridor. No wheezing.     Comments: Respirations  even and unlabored Abdominal:     General: There is distension.     Tenderness: There is no guarding.       Comments: Abdomen distended, obese. No notable tenderness to palpation or voluntary guarding. Well healed midline incision.  Genitourinary:    Comments: No fecal impaction. Sacral decubitus ulcer with tunneling component of unknown depth. Scant blood. See image below. Musculoskeletal:        General: Normal range of motion.     Cervical back: Normal range of motion.  Skin:    General: Skin is warm and dry.     Coloration: Skin is not pale.     Findings: No erythema or rash.  Neurological:     Mental Status: She is alert.     Coordination: Coordination normal.  Comments: Alert to self, place. Speech clear. Answers questions appropriately, but slow to answer.  Psychiatric:        Behavior: Behavior normal.       ED Results / Procedures / Treatments   Labs (all labs ordered are listed, but only abnormal results are displayed) Labs Reviewed  COMPREHENSIVE METABOLIC PANEL - Abnormal; Notable for the following components:      Result Value   Sodium 134 (*)    Calcium 8.3 (*)    Albumin 2.0 (*)    All other components within normal limits  CBC WITH DIFFERENTIAL/PLATELET - Abnormal; Notable for the following components:   RBC 3.50 (*)    Hemoglobin 11.2 (*)    MCV 103.1 (*)    RDW 16.4 (*)    All other components within normal limits  URINALYSIS, ROUTINE W REFLEX MICROSCOPIC - Abnormal; Notable for the following components:   Specific Gravity, Urine >1.046 (*)    All other components within normal limits  LACTIC ACID, PLASMA - Abnormal; Notable for the following components:   Lactic Acid, Venous 2.5 (*)    All other components within normal limits  LIPASE, BLOOD    EKG EKG Interpretation  Date/Time:  Sunday January 17 2022 22:27:22 EST Ventricular Rate:  75 PR Interval:  162 QRS Duration: 144 QT Interval:  440 QTC Calculation: 492 R Axis:   15 Text  Interpretation: Sinus rhythm Right bundle branch block similar to prior Confirmed by Wynona Dove ([id number]) on 01/18/2022 12:34:07 AM  Radiology CT ABDOMEN PELVIS W CONTRAST  Result Date: 01/18/2022 CLINICAL DATA:  Abdominal pain with distension. EXAM: CT ABDOMEN AND PELVIS WITH CONTRAST TECHNIQUE: Multidetector CT imaging of the abdomen and pelvis was performed using the standard protocol following bolus administration of intravenous contrast. RADIATION DOSE REDUCTION: This exam was performed according to the departmental dose-optimization program which includes automated exposure control, adjustment of the mA and/or kV according to patient size and/or use of iterative reconstruction technique. CONTRAST:  19mL OMNIPAQUE IOHEXOL 300 MG/ML  SOLN COMPARISON:  08/09/2017. FINDINGS: Lower chest: The heart is enlarged and there is a trace pericardial effusion. Multi-vessel coronary artery calcifications are noted. There are small bilateral pleural effusions with atelectasis at the lung bases. Hepatobiliary: The liver has a lobular contour which may be associated with underlying cirrhosis. A stable coarse calcification is present in the posterior right lobe of the liver. Stones are noted in the gallbladder. No biliary ductal dilatation. Pancreas: Fatty atrophy of the pancreas is noted. No pancreatic ductal dilatation or surrounding inflammatory changes. Spleen: The spleen is mildly enlarged 13.5 cm in length. Adrenals/Urinary Tract: No adrenal nodule or mass. Evaluation for renal calculus is limited due the presence of contrast at the renal pyramids. No hydronephrosis. The bladder is unremarkable. Stomach/Bowel: The stomach is within normal limits. No bowel obstruction, free air, or pneumatosis. Scattered diverticula are present along the colon without evidence of diverticulitis. The appendix is not visualized on exam due to ascites. There is a large amount of stool in the rectum with rectal wall thickening and surrounding  fat stranding. No significant stool burden in the colon. Vascular/Lymphatic: Aortic atherosclerosis without evidence of aneurysm. Prominent lymph nodes are present at the porta hepatis which are likely reactive. Reproductive: Status post hysterectomy. No adnexal masses. Other: Large ascites. Musculoskeletal: Anasarca is noted. Degenerative changes are present in the thoracolumbar spine. Stable compression deformities are noted in the superior endplate at L5 and superior endplate of L2. IMPRESSION: 1. Large amount of stool  in the rectum with rectal wall thickening surrounding fat stranding, suggesting proctitis with possible stool ball. 2. Large ascites and anasarca. 3. Morphologic changes of cirrhosis and portal hypertension. 4. Small bilateral pleural effusions with atelectasis at the lung bases. 5. Aortic atherosclerosis. 6. Cardiomegaly with coronary artery calcifications. 7. Remaining chronic findings as described above. Electronically Signed   By: Brett Fairy M.D.   On: 01/18/2022 01:25    Procedures Procedures    Medications Ordered in ED Medications  fentaNYL (SUBLIMAZE) injection 50 mcg (50 mcg Intravenous Given 01/17/22 2316)  ondansetron (ZOFRAN) injection 4 mg (4 mg Intravenous Given 01/17/22 2318)  sodium chloride 0.9 % bolus 500 mL (0 mLs Intravenous Stopped 01/18/22 0219)  iohexol (OMNIPAQUE) 300 MG/ML solution 100 mL (100 mLs Intravenous Contrast Given 01/18/22 0112)  amoxicillin-clavulanate (AUGMENTIN) 875-125 MG per tablet 1 tablet (1 tablet Oral Given 01/18/22 0418)    ED Course/ Medical Decision Making/ A&P Clinical Course as of 01/18/22 [id number]  Mon Jan 18, 2022  0125 CT viewed by myself. Appears that patient has large volume fluid - presumptive ascites. Pending formal radiology read. [KH]  0137 Radiology CT report noting large amount of stool in the rectum with rectal wall thickening surrounding fat stranding, suggesting proctitis with possible stool ball. Will perform DRE to assess for  impaction.  Ascites noted with anasarca; signs of cirrhosis and portal HTN of unclear chronicity. No prior imaging since 2018. [id number] Will necessitate abx for coverage of proctitis.   With review of labs and vitals, no criteria for SIRS/sepsis. Specifically no fever or leukocytosis; doubt SBP. [KH]  0322 UA appears concentrated, but without concern for infection. [KH]  0430 Had a long discussion with patient's daughter. 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 This was diagnosed on MRI in 10/2021.  Daughter unable to chronicle timing of developing abdominal distension. States patient is not currently followed by GI. Discussed with daughter findings of proctitis, large volume ascites as well as plan for outpatient PO abx with GI referral. Daughter acknowledges plan and verbalizes understanding.  [KH]    Clinical Course User Index [KH] Antonietta Breach, PA-C                           Medical Decision Making Amount and/or Complexity of Data Reviewed Labs: ordered. Radiology: ordered.  Risk Prescription drug management.   This patient presents to the ED for concern of abdominal distension, this involves an extensive number of treatment options, and is a complaint that carries with it a high risk of complications and morbidity.  The differential diagnosis includes SBO vs pSBO vs perforation vs ascites vs constipation/ileus vs mass   Co morbidities that complicate the patient evaluation  HTN HLD DM   Additional history obtained:  Additional history obtained from SNF, daughter via phone External records from outside source obtained and reviewed including prior sacral MRI   Lab Tests:  I Ordered, and personally interpreted labs.  The pertinent results include:  lactate of 2.5 (historically appears chronically elevated), mild anemia of 11.2 (chronic, stable).   Imaging  Studies ordered:  I ordered imaging studies including CT abdomen/pelvis  I independently visualized and interpreted imaging which showed rectal wall thickening c/w proctitis, large volume ascites I agree with the radiologist interpretation   Cardiac Monitoring:  The patient was maintained on a cardiac monitor.  I personally viewed and  interpreted the cardiac monitored which showed an underlying rhythm of: NSR   Medicines ordered and prescription drug management:  I ordered medication including Fentanyl for pain, Zofran for nausea, IVF for rehydration  Reevaluation of the patient after these medicines showed that the patient improved I have reviewed the patients home medicines and have made adjustments as needed   Test Considered:  Repeat lactic   Critical Interventions:  Initiation of abx for proctitis   Reevaluation:  After the interventions noted above, I reevaluated the patient and found that they have :stayed the same   Social Determinants of Health:  Bed bound Super morbid obesity   Dispostion:  After consideration of the diagnostic results and the patients response to treatment, I feel that the patent would benefit from initiation of course of Augmentin for management of proctitis.  The patient also was noted to have large volume ascites with signs of cirrhosis and portal hypertension.  Ascites is of unclear chronicity.          Final Clinical Impression(s) / ED Diagnoses Final diagnoses:  Proctitis  Other ascites    Rx / DC Orders ED Discharge Orders          Ordered    Ambulatory referral to Gastroenterology       Comments: Ritta Slot patient   01/18/22 0418    amoxicillin-clavulanate (AUGMENTIN) 875-125 MG tablet  Every 12 hours        01/18/22 0442              [place], [place], PA-C 01/18/22 [place], [place], DO 01/19/22 2105

## 2022-01-18 ENCOUNTER — Emergency Department (HOSPITAL_COMMUNITY): Payer: Medicare Other

## 2022-01-18 ENCOUNTER — Encounter (HOSPITAL_COMMUNITY): Payer: Self-pay

## 2022-01-18 DIAGNOSIS — K6289 Other specified diseases of anus and rectum: Secondary | ICD-10-CM | POA: Diagnosis not present

## 2022-01-18 LAB — URINALYSIS, ROUTINE W REFLEX MICROSCOPIC
Bilirubin Urine: NEGATIVE
Glucose, UA: NEGATIVE mg/dL
Hgb urine dipstick: NEGATIVE
Ketones, ur: NEGATIVE mg/dL
Leukocytes,Ua: NEGATIVE
Nitrite: NEGATIVE
Protein, ur: NEGATIVE mg/dL
Specific Gravity, Urine: >1.046 — ABNORMAL HIGH (ref 1.005–1.030)
pH: 5 (ref 5.0–8.0)

## 2022-01-18 MED ORDER — IOHEXOL 300 MG/ML  SOLN
100.0000 mL | Freq: Once | INTRAMUSCULAR | Status: AC | PRN
  Administered 2022-01-18: 100 mL via INTRAVENOUS

## 2022-01-18 MED ORDER — AMOXICILLIN-POT CLAVULANATE 875-125 MG PO TABS
1.0000 | ORAL_TABLET | Freq: Two times a day (BID) | ORAL | Status: AC
  Administered 2022-01-18: 1 via ORAL
  Filled 2022-01-18: qty 1

## 2022-01-18 MED ORDER — AMOXICILLIN-POT CLAVULANATE 875-125 MG PO TABS: 1.0000 | ORAL_TABLET | Freq: Two times a day (BID) | ORAL | 0 refills | Status: AC

## 2022-01-18 NOTE — ED Notes (Signed)
Patient yelling once again.  Patient educated on the call bell once again.  ?

## 2022-01-18 NOTE — ED Notes (Signed)
Patient yelling "help me."  When asked what's wrong, patient states "nothing."  Patient educated on call bell.  ?

## 2022-01-18 NOTE — ED Notes (Addendum)
Patient yelling. This RN walked into room and patient stated that she wanted water.  Patient educated on the call bell once again and this RN got her water.  ?

## 2022-01-18 NOTE — Discharge Instructions (Addendum)
Your work-up today showed some inflammation to your rectal area.  You have been started on Augmentin for treatment of this.  Take this antibiotic as prescribed until finished. ? ?Your CT did also reveal ascites which is a collection of fluid in your abdomen.  This is often attributed to liver disease.  Your CT did not show any evidence of bowel obstruction.  We recommend close follow-up with gastroenterology for further evaluation and management of your ascites.  A referral has been sent to the office of Dr. Collene Mares.  If you have not heard back about an appointment in 24-48 hours; call to scheduled an appointment for follow up. ? ?Return to the ED for new or concerning symptoms. ?

## 2022-01-18 NOTE — ED Notes (Signed)
[facility] called  

## 2022-01-18 NOTE — ED Notes (Signed)
MSE signed for patient d/t patient's dementia.  ?

## 2022-01-27 ENCOUNTER — Emergency Department (HOSPITAL_COMMUNITY): Payer: Medicare Other

## 2022-01-27 ENCOUNTER — Inpatient Hospital Stay (HOSPITAL_COMMUNITY)
Admission: EM | Admit: 2022-01-27 | Discharge: 2022-02-13 | DRG: 154 | Disposition: E | Payer: Medicare Other | Source: Skilled Nursing Facility | Attending: Internal Medicine | Admitting: Internal Medicine

## 2022-01-27 ENCOUNTER — Other Ambulatory Visit: Payer: Self-pay

## 2022-01-27 ENCOUNTER — Encounter (HOSPITAL_COMMUNITY): Payer: Self-pay

## 2022-01-27 DIAGNOSIS — I468 Cardiac arrest due to other underlying condition: Secondary | ICD-10-CM | POA: Diagnosis present

## 2022-01-27 DIAGNOSIS — Z803 Family history of malignant neoplasm of breast: Secondary | ICD-10-CM

## 2022-01-27 DIAGNOSIS — Z800 Family history of malignant neoplasm of digestive organs: Secondary | ICD-10-CM

## 2022-01-27 DIAGNOSIS — Z6838 Body mass index (BMI) 38.0-38.9, adult: Secondary | ICD-10-CM

## 2022-01-27 DIAGNOSIS — F32A Depression, unspecified: Secondary | ICD-10-CM | POA: Diagnosis present

## 2022-01-27 DIAGNOSIS — Z8249 Family history of ischemic heart disease and other diseases of the circulatory system: Secondary | ICD-10-CM

## 2022-01-27 DIAGNOSIS — R570 Cardiogenic shock: Secondary | ICD-10-CM | POA: Diagnosis present

## 2022-01-27 DIAGNOSIS — R4189 Other symptoms and signs involving cognitive functions and awareness: Secondary | ICD-10-CM

## 2022-01-27 DIAGNOSIS — E669 Obesity, unspecified: Secondary | ICD-10-CM | POA: Diagnosis present

## 2022-01-27 DIAGNOSIS — E875 Hyperkalemia: Secondary | ICD-10-CM | POA: Diagnosis present

## 2022-01-27 DIAGNOSIS — R7881 Bacteremia: Secondary | ICD-10-CM | POA: Diagnosis present

## 2022-01-27 DIAGNOSIS — Z515 Encounter for palliative care: Secondary | ICD-10-CM | POA: Diagnosis not present

## 2022-01-27 DIAGNOSIS — E872 Acidosis, unspecified: Secondary | ICD-10-CM | POA: Diagnosis present

## 2022-01-27 DIAGNOSIS — E890 Postprocedural hypothyroidism: Secondary | ICD-10-CM | POA: Diagnosis present

## 2022-01-27 DIAGNOSIS — T17228A Food in pharynx causing other injury, initial encounter: Secondary | ICD-10-CM | POA: Diagnosis present

## 2022-01-27 DIAGNOSIS — I10 Essential (primary) hypertension: Secondary | ICD-10-CM | POA: Diagnosis present

## 2022-01-27 DIAGNOSIS — Z66 Do not resuscitate: Secondary | ICD-10-CM | POA: Diagnosis present

## 2022-01-27 DIAGNOSIS — E119 Type 2 diabetes mellitus without complications: Secondary | ICD-10-CM | POA: Diagnosis present

## 2022-01-27 DIAGNOSIS — R258 Other abnormal involuntary movements: Secondary | ICD-10-CM | POA: Diagnosis present

## 2022-01-27 DIAGNOSIS — J9601 Acute respiratory failure with hypoxia: Secondary | ICD-10-CM | POA: Diagnosis present

## 2022-01-27 DIAGNOSIS — I469 Cardiac arrest, cause unspecified: Secondary | ICD-10-CM | POA: Diagnosis present

## 2022-01-27 DIAGNOSIS — L8990 Pressure ulcer of unspecified site, unspecified stage: Secondary | ICD-10-CM | POA: Diagnosis present

## 2022-01-27 DIAGNOSIS — Z79899 Other long term (current) drug therapy: Secondary | ICD-10-CM

## 2022-01-27 DIAGNOSIS — M8580 Other specified disorders of bone density and structure, unspecified site: Secondary | ICD-10-CM | POA: Diagnosis present

## 2022-01-27 DIAGNOSIS — Z888 Allergy status to other drugs, medicaments and biological substances status: Secondary | ICD-10-CM

## 2022-01-27 DIAGNOSIS — T17928A Food in respiratory tract, part unspecified causing other injury, initial encounter: Secondary | ICD-10-CM | POA: Diagnosis present

## 2022-01-27 DIAGNOSIS — J9811 Atelectasis: Secondary | ICD-10-CM | POA: Diagnosis present

## 2022-01-27 DIAGNOSIS — E7800 Pure hypercholesterolemia, unspecified: Secondary | ICD-10-CM | POA: Diagnosis present

## 2022-01-27 DIAGNOSIS — Z882 Allergy status to sulfonamides status: Secondary | ICD-10-CM

## 2022-01-27 DIAGNOSIS — R188 Other ascites: Secondary | ICD-10-CM | POA: Diagnosis present

## 2022-01-27 DIAGNOSIS — F0390 Unspecified dementia without behavioral disturbance: Secondary | ICD-10-CM | POA: Diagnosis present

## 2022-01-27 DIAGNOSIS — Z993 Dependence on wheelchair: Secondary | ICD-10-CM

## 2022-01-27 DIAGNOSIS — J9691 Respiratory failure, unspecified with hypoxia: Secondary | ICD-10-CM | POA: Diagnosis not present

## 2022-01-27 DIAGNOSIS — G4730 Sleep apnea, unspecified: Secondary | ICD-10-CM | POA: Diagnosis present

## 2022-01-27 DIAGNOSIS — J9602 Acute respiratory failure with hypercapnia: Secondary | ICD-10-CM | POA: Diagnosis present

## 2022-01-27 DIAGNOSIS — Z96653 Presence of artificial knee joint, bilateral: Secondary | ICD-10-CM | POA: Diagnosis present

## 2022-01-27 DIAGNOSIS — D72829 Elevated white blood cell count, unspecified: Secondary | ICD-10-CM

## 2022-01-27 DIAGNOSIS — Z90710 Acquired absence of both cervix and uterus: Secondary | ICD-10-CM

## 2022-01-27 DIAGNOSIS — Z886 Allergy status to analgesic agent status: Secondary | ICD-10-CM

## 2022-01-27 DIAGNOSIS — B952 Enterococcus as the cause of diseases classified elsewhere: Secondary | ICD-10-CM | POA: Diagnosis present

## 2022-01-27 DIAGNOSIS — F419 Anxiety disorder, unspecified: Secondary | ICD-10-CM | POA: Diagnosis present

## 2022-01-27 DIAGNOSIS — Z794 Long term (current) use of insulin: Secondary | ICD-10-CM

## 2022-01-27 LAB — URINALYSIS, MICROSCOPIC (REFLEX)

## 2022-01-27 LAB — CBC WITH DIFFERENTIAL/PLATELET
Abs Immature Granulocytes: 0 10*3/uL (ref 0.00–0.07)
Basophils Absolute: 0 10*3/uL (ref 0.0–0.1)
Basophils Relative: 0 %
Eosinophils Absolute: 0 10*3/uL (ref 0.0–0.5)
Eosinophils Relative: 0 %
HCT: 35 % — ABNORMAL LOW (ref 36.0–46.0)
Hemoglobin: 10 g/dL — ABNORMAL LOW (ref 12.0–15.0)
Lymphocytes Relative: 30 %
Lymphs Abs: 4.1 10*3/uL — ABNORMAL HIGH (ref 0.7–4.0)
MCH: 32.3 pg (ref 26.0–34.0)
MCHC: 28.6 g/dL — ABNORMAL LOW (ref 30.0–36.0)
MCV: 112.9 fL — ABNORMAL HIGH (ref 80.0–100.0)
Monocytes Absolute: 0.7 10*3/uL (ref 0.1–1.0)
Monocytes Relative: 5 %
Neutro Abs: 8.9 10*3/uL — ABNORMAL HIGH (ref 1.7–7.7)
Neutrophils Relative %: 65 %
Platelets: 199 10*3/uL (ref 150–400)
RBC: 3.1 MIL/uL — ABNORMAL LOW (ref 3.87–5.11)
RDW: 16.3 % — ABNORMAL HIGH (ref 11.5–15.5)
WBC: 13.7 10*3/uL — ABNORMAL HIGH (ref 4.0–10.5)
nRBC: 0 /100 WBC
nRBC: 0.2 % (ref 0.0–0.2)

## 2022-01-27 LAB — URINALYSIS, ROUTINE W REFLEX MICROSCOPIC
Bilirubin Urine: NEGATIVE
Glucose, UA: NEGATIVE mg/dL
Ketones, ur: NEGATIVE mg/dL
Nitrite: NEGATIVE
Protein, ur: NEGATIVE mg/dL
Specific Gravity, Urine: >1.03 — ABNORMAL HIGH (ref 1.005–1.030)
pH: 5.5 (ref 5.0–8.0)

## 2022-01-27 LAB — I-STAT CHEM 8, ED
BUN: 17 mg/dL (ref 8–23)
Calcium, Ion: 1.09 mmol/L — ABNORMAL LOW (ref 1.15–1.40)
Chloride: 102 mmol/L (ref 98–111)
Creatinine, Ser: 0.6 mg/dL (ref 0.44–1.00)
Glucose, Bld: 128 mg/dL — ABNORMAL HIGH (ref 70–99)
HCT: 34 % — ABNORMAL LOW (ref 36.0–46.0)
Hemoglobin: 11.6 g/dL — ABNORMAL LOW (ref 12.0–15.0)
Potassium: 5.4 mmol/L — ABNORMAL HIGH (ref 3.5–5.1)
Sodium: 138 mmol/L (ref 135–145)
TCO2: 22 mmol/L (ref 22–32)

## 2022-01-27 LAB — COMPREHENSIVE METABOLIC PANEL
ALT: 24 U/L (ref 0–44)
AST: 98 U/L — ABNORMAL HIGH (ref 15–41)
Albumin: 1.6 g/dL — ABNORMAL LOW (ref 3.5–5.0)
Alkaline Phosphatase: 71 U/L (ref 38–126)
Anion gap: 14 (ref 5–15)
BUN: 13 mg/dL (ref 8–23)
CO2: 18 mmol/L — ABNORMAL LOW (ref 22–32)
Calcium: 7.9 mg/dL — ABNORMAL LOW (ref 8.9–10.3)
Chloride: 102 mmol/L (ref 98–111)
Creatinine, Ser: 0.82 mg/dL (ref 0.44–1.00)
GFR, Estimated: >60 mL/min (ref >60)
Glucose, Bld: 134 mg/dL — ABNORMAL HIGH (ref 70–99)
Potassium: 5.9 mmol/L — ABNORMAL HIGH (ref 3.5–5.1)
Sodium: 134 mmol/L — ABNORMAL LOW (ref 135–145)
Total Bilirubin: 1.1 mg/dL (ref 0.3–1.2)
Total Protein: 5.4 g/dL — ABNORMAL LOW (ref 6.5–8.1)

## 2022-01-27 LAB — I-STAT ARTERIAL BLOOD GAS, ED
Acid-base deficit: 11 mmol/L — ABNORMAL HIGH (ref 0.0–2.0)
Bicarbonate: 19.5 mmol/L — ABNORMAL LOW (ref 20.0–28.0)
Calcium, Ion: 1.12 mmol/L — ABNORMAL LOW (ref 1.15–1.40)
HCT: 30 % — ABNORMAL LOW (ref 36.0–46.0)
Hemoglobin: 10.2 g/dL — ABNORMAL LOW (ref 12.0–15.0)
O2 Saturation: 100 %
Patient temperature: 96.8
Potassium: 4.2 mmol/L (ref 3.5–5.1)
Sodium: 137 mmol/L (ref 135–145)
TCO2: 22 mmol/L (ref 22–32)
pCO2 arterial: 69.1 mmHg (ref 32–48)
pH, Arterial: 7.052 — CL (ref 7.35–7.45)
pO2, Arterial: 423 mmHg — ABNORMAL HIGH (ref 83–108)

## 2022-01-27 LAB — MAGNESIUM: Magnesium: 2 mg/dL (ref 1.7–2.4)

## 2022-01-27 LAB — LACTIC ACID, PLASMA: Lactic Acid, Venous: >9 mmol/L (ref 0.5–1.9)

## 2022-01-27 LAB — TROPONIN I (HIGH SENSITIVITY): Troponin I (High Sensitivity): 37 ng/L — ABNORMAL HIGH (ref <18)

## 2022-01-27 MED ORDER — OLANZAPINE 5 MG PO TBDP: 5.0000 mg | ORAL_TABLET | Freq: Every day | ORAL | Status: DC

## 2022-01-27 MED ORDER — NOREPINEPHRINE 4 MG/250ML-% IV SOLN
0.0000 ug/min | INTRAVENOUS | Status: DC
  Administered 2022-01-27: 15 ug/min via INTRAVENOUS

## 2022-01-27 MED ORDER — ACETAMINOPHEN 160 MG/5ML PO SOLN: 650.0000 mg | Freq: Four times a day (QID) | ORAL | Status: DC | PRN

## 2022-01-27 MED ORDER — GLYCOPYRROLATE 1 MG PO TABS
1.0000 mg | ORAL_TABLET | ORAL | Status: DC | PRN
  Filled 2022-01-27: qty 1

## 2022-01-27 MED ORDER — ACETAMINOPHEN 325 MG PO TABS: 650.0000 mg | ORAL_TABLET | Freq: Four times a day (QID) | ORAL | Status: DC | PRN

## 2022-01-27 MED ORDER — SODIUM CHLORIDE 0.9 % IV SOLN
3.0000 g | Freq: Four times a day (QID) | INTRAVENOUS | Status: DC
  Filled 2022-01-27 (×2): qty 8

## 2022-01-27 MED ORDER — ACETAMINOPHEN 650 MG RE SUPP: 650.0000 mg | Freq: Four times a day (QID) | RECTAL | Status: DC | PRN

## 2022-01-27 MED ORDER — LORAZEPAM 2 MG/ML IJ SOLN: 1.0000 mg | INTRAMUSCULAR | Status: DC | PRN

## 2022-01-27 MED ORDER — SODIUM CHLORIDE 0.9% FLUSH: 3.0000 mL | INTRAVENOUS | Status: DC | PRN

## 2022-01-27 MED ORDER — ONDANSETRON HCL 4 MG/2ML IJ SOLN: 4.0000 mg | Freq: Four times a day (QID) | INTRAMUSCULAR | Status: DC | PRN

## 2022-01-27 MED ORDER — NOREPINEPHRINE 4 MG/250ML-% IV SOLN: 2.0000 ug/min | INTRAVENOUS | Status: DC

## 2022-01-27 MED ORDER — BIOTENE DRY MOUTH MT LIQD: 15.0000 mL | OROMUCOSAL | Status: DC | PRN

## 2022-01-27 MED ORDER — SODIUM CHLORIDE 0.9 % IV SOLN: 12.5000 mg | Freq: Four times a day (QID) | INTRAVENOUS | Status: DC | PRN

## 2022-01-27 MED ORDER — POLYETHYLENE GLYCOL 3350 17 G PO PACK: 17.0000 g | PACK | Freq: Every day | ORAL | Status: DC | PRN

## 2022-01-27 MED ORDER — FENTANYL CITRATE PF 50 MCG/ML IJ SOSY: 50.0000 ug | PREFILLED_SYRINGE | INTRAMUSCULAR | Status: DC | PRN

## 2022-01-27 MED ORDER — MORPHINE 100MG IN NS 100ML (1MG/ML) PREMIX INFUSION
0.0000 mg/h | INTRAVENOUS | Status: DC
  Administered 2022-01-27: 5 mg/h via INTRAVENOUS
  Filled 2022-01-27: qty 100

## 2022-01-27 MED ORDER — DOCUSATE SODIUM 50 MG/5ML PO LIQD
100.0000 mg | Freq: Two times a day (BID) | ORAL | Status: DC | PRN
Start: 2022-01-27 — End: 2022-01-28

## 2022-01-27 MED ORDER — GLYCOPYRROLATE 0.2 MG/ML IJ SOLN: 0.2000 mg | INTRAMUSCULAR | Status: DC | PRN

## 2022-01-27 MED ORDER — MORPHINE BOLUS VIA INFUSION
5.0000 mg | INTRAVENOUS | Status: DC | PRN
  Filled 2022-01-27: qty 5

## 2022-01-27 MED ORDER — DEXTROSE 5 % IV SOLN: INTRAVENOUS | Status: DC

## 2022-01-27 MED ORDER — DOCUSATE SODIUM 100 MG PO CAPS: 100.0000 mg | ORAL_CAPSULE | Freq: Two times a day (BID) | ORAL | Status: DC | PRN

## 2022-01-27 MED ORDER — DIPHENHYDRAMINE HCL 50 MG/ML IJ SOLN: 25.0000 mg | INTRAMUSCULAR | Status: DC | PRN

## 2022-01-27 MED ORDER — SODIUM CHLORIDE 0.9 % IV SOLN
250.0000 mL | INTRAVENOUS | Status: DC | PRN
Start: 2022-01-27 — End: 2022-01-28

## 2022-01-27 MED ORDER — POLYVINYL ALCOHOL 1.4 % OP SOLN: 1.0000 [drp] | OPHTHALMIC | Status: DC | PRN

## 2022-01-27 MED ORDER — LORAZEPAM 2 MG/ML IJ SOLN: 2.0000 mg | INTRAMUSCULAR | Status: DC | PRN

## 2022-01-27 MED ORDER — ONDANSETRON 4 MG PO TBDP: 4.0000 mg | ORAL_TABLET | Freq: Four times a day (QID) | ORAL | Status: DC | PRN

## 2022-01-27 MED ORDER — MORPHINE SULFATE (PF) 2 MG/ML IV SOLN: 2.0000 mg | INTRAVENOUS | Status: DC | PRN

## 2022-01-27 MED ORDER — SODIUM CHLORIDE 0.9 % IV SOLN
250.0000 mL | INTRAVENOUS | Status: DC
Start: 2022-01-27 — End: 2022-01-28

## 2022-01-27 MED ORDER — DEXAMETHASONE SODIUM PHOSPHATE 4 MG/ML IJ SOLN: 4.0000 mg | Freq: Four times a day (QID) | INTRAMUSCULAR | Status: DC | PRN

## 2022-01-27 MED ORDER — POLYVINYL ALCOHOL 1.4 % OP SOLN: 1.0000 [drp] | Freq: Four times a day (QID) | OPHTHALMIC | Status: DC | PRN

## 2022-01-27 MED ORDER — BISACODYL 10 MG RE SUPP
10.0000 mg | Freq: Every day | RECTAL | Status: DC | PRN
Start: 2022-01-27 — End: 2022-01-28

## 2022-01-27 MED ORDER — SODIUM CHLORIDE 0.9 % IV SOLN
3.0000 g | Freq: Two times a day (BID) | INTRAVENOUS | Status: DC
  Filled 2022-01-27: qty 8

## 2022-01-27 MED ORDER — GLYCOPYRROLATE 1 MG PO TABS: 1.0000 mg | ORAL_TABLET | ORAL | Status: DC | PRN

## 2022-01-27 MED ORDER — MIDAZOLAM HCL 2 MG/2ML IJ SOLN: 2.0000 mg | INTRAMUSCULAR | Status: DC | PRN

## 2022-01-27 MED ORDER — LORAZEPAM 2 MG/ML IJ SOLN: 0.5000 mg | INTRAMUSCULAR | Status: DC | PRN

## 2022-01-27 MED ORDER — FENTANYL CITRATE (PF) 100 MCG/2ML IJ SOLN: 50.0000 ug | INTRAMUSCULAR | Status: DC | PRN

## 2022-01-27 MED ORDER — FENTANYL CITRATE (PF) 100 MCG/2ML IJ SOLN: 25.0000 ug | INTRAMUSCULAR | Status: DC | PRN

## 2022-01-27 MED ORDER — SODIUM CHLORIDE 0.9% FLUSH: 3.0000 mL | Freq: Two times a day (BID) | INTRAVENOUS | Status: DC

## 2022-01-27 MED ORDER — POLYVINYL ALCOHOL 1.4 % OP SOLN
1.0000 [drp] | Freq: Four times a day (QID) | OPHTHALMIC | Status: DC | PRN
  Filled 2022-01-27: qty 15

## 2022-01-28 LAB — BLOOD CULTURE ID PANEL (REFLEXED) - BCID2
A.calcoaceticus-baumannii: NOT DETECTED
Bacteroides fragilis: NOT DETECTED
Candida albicans: NOT DETECTED
Candida auris: NOT DETECTED
Candida glabrata: NOT DETECTED
Candida krusei: NOT DETECTED
Candida parapsilosis: NOT DETECTED
Candida tropicalis: NOT DETECTED
Cryptococcus neoformans/gattii: NOT DETECTED
Enterobacter cloacae complex: NOT DETECTED
Enterobacterales: NOT DETECTED
Enterococcus Faecium: NOT DETECTED
Enterococcus faecalis: DETECTED — AB
Escherichia coli: NOT DETECTED
Haemophilus influenzae: NOT DETECTED
Klebsiella aerogenes: NOT DETECTED
Klebsiella oxytoca: NOT DETECTED
Klebsiella pneumoniae: NOT DETECTED
Listeria monocytogenes: NOT DETECTED
Methicillin resistance mecA/C: DETECTED — AB
Neisseria meningitidis: NOT DETECTED
Proteus species: NOT DETECTED
Pseudomonas aeruginosa: NOT DETECTED
Salmonella species: NOT DETECTED
Serratia marcescens: NOT DETECTED
Staphylococcus aureus (BCID): NOT DETECTED
Staphylococcus epidermidis: DETECTED — AB
Staphylococcus lugdunensis: NOT DETECTED
Staphylococcus species: DETECTED — AB
Stenotrophomonas maltophilia: NOT DETECTED
Streptococcus agalactiae: NOT DETECTED
Streptococcus pneumoniae: NOT DETECTED
Streptococcus pyogenes: NOT DETECTED
Streptococcus species: NOT DETECTED
Vancomycin resistance: DETECTED — AB

## 2022-01-28 NOTE — Discharge Summary (Signed)
?DEATH SUMMARY  ? ?Patient Details  ?Name: Adrienne [name] ?MRN: [id number] ?DOB: 1936/05/18 ? ?Admission/Discharge Information  ? ?Admit Date:  2022/02/26  ?Date of Death: Date of Death: February 26, 2022  ?Time of Death: Time of Death: Mar 24, 2339  ?Length of Stay: 1  ?Referring Physician: Deland Pretty, MD  ? ?Reason(s) for Hospitalization  ?Cardiac Arrest ? ?Diagnoses  ?Preliminary cause of death: Cardiac Arrest secondary to acute aspiration event and respiratory arrest.  ? ?Secondary Diagnoses (including complications and co-morbidities):  ?Principal Problem: ?  Cardiac arrest ([place]) ?Active Problems: ?  Lactic acidosis ?Dementia ?Debility ?HTN ?HLD ?Obesity ? ?Brief Hospital Course (including significant findings, care, treatment, and services provided and events leading to death)  ?Adrienne [name] was a 86 y.o. year old female with PMH significant for HTN, DM, HL, obesity, resided at nursing home after knee replacements who was found unresponsive for unknown amount of time. , but possibly 30 minutes prior to initiation of CPR.  No shockable rhythm, underwent 40 minutes of CPR before ROSC.  Food was seen in the airway on intubation.   She was unresponsive without any sedation.  Additional medical history of chronic bed sores, recent infections related to decubitus ulcer, rapidly progressing ascites and decreased activity level. After discussion with pt's daughter at the bedside code status was changed to DNR and plan to transition to comfort care once family arrived. She was terminally extubated and expired with time of death 23:40. Following her death blood cultures did result positive for enterococcus faecalis.  ? ? ?Pertinent Labs and Studies  ?Significant Diagnostic Studies ?CT Head Wo Contrast ? ?Result Date: 2022-02-26 ?CLINICAL DATA:  Arrest status post CPR EXAM: CT HEAD WITHOUT CONTRAST TECHNIQUE: Contiguous axial images were obtained from the base of the skull through the vertex without intravenous contrast. RADIATION  DOSE REDUCTION: This exam was performed according to the departmental dose-optimization program which includes automated exposure control, adjustment of the mA and/or kV according to patient size and/or use of iterative reconstruction technique. COMPARISON:  12/27/2021 FINDINGS: Brain: Diffuse cerebral atrophy again noted. Chronic small vessel ischemic changes are seen throughout the periventricular and subcortical white matter. No evidence of acute infarct or hemorrhage. Lateral ventricles and midline structures are stable. No acute extra-axial fluid collections. No mass effect. Vascular: Stable atherosclerosis.  No hyperdense vessel. Skull: Near complete resolution of the right frontal scalp hematoma seen previously. No acute bony abnormalities. Sinuses/Orbits: Minimal fluid within the right posterior ethmoid air cells and right sphenoid sinus. Remaining paranasal sinuses are clear. Other: None. IMPRESSION: 1. No acute intracranial process. 2. Chronic small vessel ischemic changes and diffuse cerebral atrophy, stable. Electronically Signed   By: Randa Ngo M.D.   On: February 26, 2022 20:29  ? ?CT ABDOMEN PELVIS W CONTRAST ? ?Result Date: 01/18/2022 ?CLINICAL DATA:  Abdominal pain with distension. EXAM: CT ABDOMEN AND PELVIS WITH CONTRAST TECHNIQUE: Multidetector CT imaging of the abdomen and pelvis was performed using the standard protocol following bolus administration of intravenous contrast. RADIATION DOSE REDUCTION: This exam was performed according to the departmental dose-optimization program which includes automated exposure control, adjustment of the mA and/or kV according to patient size and/or use of iterative reconstruction technique. CONTRAST:  123mL OMNIPAQUE IOHEXOL 300 MG/ML  SOLN COMPARISON:  08/09/2017. FINDINGS: Lower chest: The heart is enlarged and there is a trace pericardial effusion. Multi-vessel coronary artery calcifications are noted. There are small bilateral pleural effusions with  atelectasis at the lung bases. Hepatobiliary: The liver has a lobular contour which may be  associated with underlying cirrhosis. A stable coarse calcification is present in the posterior right lobe of the liver. Stones are noted in the gallbladder. No biliary ductal dilatation. Pancreas: Fatty atrophy of the pancreas is noted. No pancreatic ductal dilatation or surrounding inflammatory changes. Spleen: The spleen is mildly enlarged 13.5 cm in length. Adrenals/Urinary Tract: No adrenal nodule or mass. Evaluation for renal calculus is limited due the presence of contrast at the renal pyramids. No hydronephrosis. The bladder is unremarkable. Stomach/Bowel: The stomach is within normal limits. No bowel obstruction, free air, or pneumatosis. Scattered diverticula are present along the colon without evidence of diverticulitis. The appendix is not visualized on exam due to ascites. There is a large amount of stool in the rectum with rectal wall thickening and surrounding fat stranding. No significant stool burden in the colon. Vascular/Lymphatic: Aortic atherosclerosis without evidence of aneurysm. Prominent lymph nodes are present at the porta hepatis which are likely reactive. Reproductive: Status post hysterectomy. No adnexal masses. Other: Large ascites. Musculoskeletal: Anasarca is noted. Degenerative changes are present in the thoracolumbar spine. Stable compression deformities are noted in the superior endplate at L5 and superior endplate of L2. IMPRESSION: 1. Large amount of stool in the rectum with rectal wall thickening surrounding fat stranding, suggesting proctitis with possible stool ball. 2. Large ascites and anasarca. 3. Morphologic changes of cirrhosis and portal hypertension. 4. Small bilateral pleural effusions with atelectasis at the lung bases. 5. Aortic atherosclerosis. 6. Cardiomegaly with coronary artery calcifications. 7. Remaining chronic findings as described above. Electronically Signed   By:  Brett Fairy M.D.   On: 01/18/2022 01:25  ? ?DG Chest Portable 1 View ? ?Result Date: 02-16-22 ?CLINICAL DATA:  Post intubation EXAM: PORTABLE CHEST 1 VIEW COMPARISON:  12/27/2021 FINDINGS: Patient is rotated. An endotracheal tube has been placed with tip measuring 4.8 cm above the carina. Enteric tube is present with tip off the field of view but below the left hemidiaphragm. Shallow inspiration. Heart size and pulmonary vascularity are likely normal for technique. Linear atelectasis or infiltration in both lung bases. No pleural effusions. No pneumothorax. Calcification in the right upper quadrant possibly adrenal or dystrophic. Displaced right rib fractures are present. IMPRESSION: Appliances appear in satisfactory position. Shallow inspiration with infiltration or atelectasis in the lung bases. Electronically Signed   By: Lucienne Capers M.D.   On: 02-16-22 19:58   ? ?Microbiology ?Recent Results (from the past 240 hour(s))  ?Blood culture (routine x 2)     Status: None (Preliminary result)  ? Collection Time: Feb 16, 2022  8:25 PM  ? Specimen: BLOOD LEFT HAND  ?Result Value Ref Range Status  ? Specimen Description BLOOD LEFT HAND  Final  ? Special Requests   Final  ?  BOTTLES DRAWN AEROBIC AND ANAEROBIC Blood Culture adequate volume  ? Culture  Setup Time   Final  ?  GRAM POSITIVE COCCI IN CHAINS ?IN BOTH AEROBIC AND ANAEROBIC BOTTLES ?PATIENT DISCHARGED OR EXPIRED ?Performed at [facility], [place] [address], [place], [place] [address] ?  ? Culture GRAM POSITIVE COCCI  Final  ? Report Status PENDING  Incomplete  ?Blood Culture ID Panel (Reflexed)     Status: Abnormal  ? Collection Time: 2022-02-16  8:25 PM  ?Result Value Ref Range Status  ? Enterococcus faecalis DETECTED (A) NOT DETECTED Final  ?  Comment: PATIENT DISCHARGED OR EXPIRED  ? Enterococcus Faecium NOT DETECTED NOT DETECTED Final  ? Listeria monocytogenes NOT DETECTED NOT DETECTED Final  ? Staphylococcus species  DETECTED (A) NOT DETECTED Final  ?   Comment: PATIENT DISCHARGED OR EXPIRED  ? Staphylococcus aureus (BCID) NOT DETECTED NOT DETECTED Final  ? Staphylococcus epidermidis DETECTED (A) NOT DETECTED Final  ?  Comment: Methicillin (oxacillin) resistan

## 2022-01-30 LAB — CULTURE, BLOOD (ROUTINE X 2): Special Requests: ADEQUATE

## 2022-02-01 LAB — CULTURE, BLOOD (ROUTINE X 2)
Culture: NO GROWTH
Special Requests: ADEQUATE

## 2022-02-13 NOTE — Progress Notes (Signed)
VAST consult placed for vasopressor USGIV. Per bedside RN, postpone placement of USGIV until discussion between providers and family on code status. Another consult should be placed if needed.  ?

## 2022-02-13 NOTE — Procedures (Signed)
Extubation Procedure Note ? ?Patient Details:   ?Name: Adrienne [name] ?DOB: [birth year] ?MRN: [id number] ?  ?Airway Documentation:  ?Airway 7.5 mm (Active)  ?Secured at (cm) 22 cm 20-Feb-2022 2230  ?Measured From Lips 02-20-2022 2230  ?Secured Location Right 02-20-22 1920  ?Secured By [employer] 02-20-22 2230  ?Tube Holder Repositioned Yes 2022/02/20 1920  ?Prone position No 02/20/22 1920  ?Cuff Pressure (cm H2O) Green OR 18-26 [facility] 2022/02/20 1920  ?Site Condition Dry 02-20-2022 1920  ? ?Vent end date: 02-20-22 Vent end time: 2318  ? ? ?[name] ?2022-02-20, 11:22 PM ? ?

## 2022-02-13 NOTE — Progress Notes (Signed)
Patient was extubated to room air per order and family wishes. Time of death 03/31/2339 confirmed by myself and Benard Rink. Family given support and were appreciative of the care the patient received.  ?

## 2022-02-13 NOTE — Progress Notes (Signed)
Critical ABG results given to ED MD at this time. Orders to increase rate from 16 to 20 at this time.  ?

## 2022-02-13 NOTE — ED Triage Notes (Signed)
Pt came in post arrest.Pt was found by staff at facility around 1815 .LKN was 1730. Staff started CPR. EMS continue CPR for 40 mins. Pt has 3 epi, 300 fluids . Pt had IO on arrival. Pt was being tested for fluid in abd. That was out abnormal for her.  ?

## 2022-02-13 NOTE — Progress Notes (Signed)
Patient transported to CT and back on ventilator with no complications. Vitals stable.  ?

## 2022-02-13 NOTE — ED Notes (Signed)
Levophed down to 12 mcg ?

## 2022-02-13 NOTE — ED Provider Notes (Addendum)
[facility] EMERGENCY DEPARTMENT Provider Note   CSN: [id number] Arrival date & time: 2022-02-14  1927     History  Chief Complaint  Patient presents with   Post CPR    Adrienne [name] is a 86 y.o. female.   Presents to ER post CPR.  Per EMS report, last known well was 730.  Found by staff around 615 unresponsive.  Staff initiated CPR.  EMS continued CPR.  40 minutes at least of CPR, unclear if this time included what staff had administered.  No shockable rhythms.  No sedating medications given, no neurologic response noted.  Additional history obtained from daughter, no known DNR STATUS or advanced directives.  HPI     Home Medications Prior to Admission medications   Medication Sig Start Date End Date Taking? Authorizing Provider  acetaminophen (TYLENOL) 325 MG tablet Take 650 mg by mouth every 6 (six) hours as needed for mild pain.    [provider]  ALPRAZolam Prudy Feeler) 0.25 MG tablet Take 0.25 mg by mouth 2 (two) times daily as needed for anxiety. 01/05/22   [provider]  Amino Acids-Protein Hydrolys (FEEDING SUPPLEMENT, PRO-STAT SUGAR FREE 64,) LIQD Take 30 mLs by mouth 2 (two) times daily.    [provider]  amoxicillin-clavulanate (AUGMENTIN) 875-125 MG tablet Take 1 tablet by mouth every 12 (twelve) hours. 01/18/22   Antony Madura, PA-C  bisacodyl (DULCOLAX) 10 MG suppository Place 10 mg rectally every 3 (three) days.    [provider]  Calcium Carbonate (CALTRATE 600 PO) Take 600 mg by mouth 2 (two) times daily.    [provider]  ceFEPIme (MAXIPIME) 1-5 GM-%(50ML) SOLR Inject 1 g into the vein every 12 (twelve) hours. for 1 week    [provider]  cholecalciferol (VITAMIN D3) 25 MCG (1000 UNIT) tablet Take 4,000 Units by mouth daily.    [provider]  divalproex (DEPAKOTE) 125 MG DR tablet Take 125 mg by mouth 2 (two) times daily. At 8am and 5 pm    [provider]  docusate  sodium (COLACE) 100 MG capsule Take 200 mg by mouth daily.    [provider]  DULoxetine HCl 40 MG CPEP Take 40 mg by mouth daily. 10/11/21   [provider]  fenofibrate (TRICOR) 48 MG tablet Take 48 mg by mouth every evening.    [provider]  furosemide (LASIX) 20 MG tablet Take 20 mg by mouth daily. for 5 days    [provider]  gabapentin (NEURONTIN) 100 MG capsule Take 100 mg by mouth at bedtime. 12/01/21   [provider]  Infant Care Products (DERMACLOUD EX) Apply 1 application topically in the morning and at bedtime.    [provider]  insulin aspart (NOVOLOG) 100 UNIT/ML injection Inject 0-9 Units into the skin 3 (three) times daily with meals. Patient taking differently: Inject 2-11 Units into the skin in the morning, at noon, and at bedtime. Per sliding scale 01/02/17   Maxie Barb, MD  linaclotide [facility]) 72 MCG capsule Take 72 mcg by mouth at bedtime.    [provider]  magnesium oxide (MAG-OX) 400 (241.3 Mg) MG tablet Take 0.5 tablets (200 mg total) by mouth 2 (two) times daily. Patient taking differently: Take 400 mg by mouth daily. 08/15/17   Regalado, Belkys A, MD  Multiple Vitamins-Minerals (CENTRUM SILVER PO) Take 1 tablet by mouth daily.    [provider]  multivitamin-iron-minerals-folic acid (THERAPEUTIC-M) TABS tablet Take  1 tablet by mouth daily.    [provider]  potassium chloride (MICRO-K) 10 MEQ CR capsule Take 10 mEq by mouth daily.    [provider]  pravastatin (PRAVACHOL) 80 MG tablet Take 80 mg by mouth every evening.    [provider]  senna-docusate (SENOKOT-S) 8.6-50 MG tablet Take 1 tablet by mouth 2 (two) times daily. 08/15/17   Regalado, Belkys A, MD  traMADol (ULTRAM) 50 MG tablet Take 50 mg by mouth every 8 (eight) hours as needed for moderate pain.    [provider]  Vancomycin HCl in NaCl 1-0.9 GM/200ML-% SOLN Inject 1,000 mg into  the vein daily. Patient not taking: Reported on 01/18/2022    [provider]  vitamin B-12 (CYANOCOBALAMIN) 1000 MCG tablet Take 1,000 mcg by mouth every evening.    [provider]  vitamin C (ASCORBIC ACID) 500 MG tablet Take 500 mg by mouth 2 (two) times daily.    [provider]      Allergies    Prednisone, Ibuprofen, Meclizine, and Sulfonamide derivatives    Review of Systems   Review of Systems  Unable to perform ROS: Mental status change   Physical Exam Updated Vital Signs BP (!) 88/74 (BP Location: Right Arm)   Pulse 98   Temp (!) 97.3 F (36.3 C) (Bladder)   Resp (!) 5   Ht 5\' 2"  (1.575 m)   Wt 96 kg   SpO2 100%   BMI 38.71 kg/m  Physical Exam Vitals and nursing note reviewed. Exam conducted with a chaperone present.  Constitutional:      Appearance: She is well-developed.     Comments: Unresponsive  HENT:     Head: Normocephalic and atraumatic.     Mouth/Throat:     Comments: Vomit in mouth Eyes:     Comments: Dilated, fixed, no response to light  Cardiovascular:     Comments: Weak carotid pulse Pulmonary:     Comments: No spontaneous breaths, breath sounds are present via Brooke Dare airway Abdominal:     Palpations: Abdomen is soft.     Tenderness: There is no abdominal tenderness.  Musculoskeletal:        General: No swelling or deformity.     Cervical back: Neck supple.  Skin:    General: Skin is warm and dry.     Capillary Refill: Capillary refill takes less than 2 seconds.  Neurological:     Comments: GCS 3  Psychiatric:        Mood and Affect: Mood normal.    ED Results / Procedures / Treatments   Labs (all labs ordered are listed, but only abnormal results are displayed) Labs Reviewed  BLOOD CULTURE ID PANEL (REFLEXED) - BCID2 - Abnormal; Notable for the following components:      Result Value   Enterococcus faecalis DETECTED (*)    Staphylococcus species DETECTED (*)    Staphylococcus epidermidis DETECTED (*)     Methicillin resistance mecA/C DETECTED (*)    Vancomycin resistance DETECTED (*)    All other components within normal limits  CBC WITH DIFFERENTIAL/PLATELET - Abnormal; Notable for the following components:   WBC 13.7 (*)    RBC 3.10 (*)    Hemoglobin 10.0 (*)    HCT 35.0 (*)    MCV 112.9 (*)    MCHC 28.6 (*)    RDW 16.3 (*)    Neutro Abs 8.9 (*)    Lymphs Abs 4.1 (*)    All other components  within normal limits  COMPREHENSIVE METABOLIC PANEL - Abnormal; Notable for the following components:   Sodium 134 (*)    Potassium 5.9 (*)    CO2 18 (*)    Glucose, Bld 134 (*)    Calcium 7.9 (*)    Total Protein 5.4 (*)    Albumin 1.6 (*)    AST 98 (*)    All other components within normal limits  LACTIC ACID, PLASMA - Abnormal; Notable for the following components:   Lactic Acid, Venous >9.0 (*)    All other components within normal limits  URINALYSIS, ROUTINE W REFLEX MICROSCOPIC - Abnormal; Notable for the following components:   Specific Gravity, Urine >1.030 (*)    Hgb urine dipstick TRACE (*)    Leukocytes,Ua SMALL (*)    All other components within normal limits  URINALYSIS, MICROSCOPIC (REFLEX) - Abnormal; Notable for the following components:   Bacteria, UA FEW (*)    All other components within normal limits  I-STAT CHEM 8, ED - Abnormal; Notable for the following components:   Potassium 5.4 (*)    Glucose, Bld 128 (*)    Calcium, Ion 1.09 (*)    Hemoglobin 11.6 (*)    HCT 34.0 (*)    All other components within normal limits  I-STAT ARTERIAL BLOOD GAS, ED - Abnormal; Notable for the following components:   pH, Arterial 7.052 (*)    pCO2 arterial 69.1 (*)    pO2, Arterial 423 (*)    Bicarbonate 19.5 (*)    Acid-base deficit 11.0 (*)    Calcium, Ion 1.12 (*)    HCT 30.0 (*)    Hemoglobin 10.2 (*)    All other components within normal limits  TROPONIN I (HIGH SENSITIVITY) - Abnormal; Notable for the following components:   Troponin I (High Sensitivity) 37 (*)     All other components within normal limits  CULTURE, BLOOD (ROUTINE X 2)  CULTURE, BLOOD (ROUTINE X 2)  MAGNESIUM    EKG EKG Interpretation  Date/Time:  Wednesday 12-Feb-2022 20:08:05 EDT Ventricular Rate:  107 PR Interval:  138 QRS Duration: 131 QT Interval:  393 QTC Calculation: 525 R Axis:   121 Text Interpretation: Sinus or ectopic atrial tachycardia Consider dextrocardia Confirmed by Marianna Fuss ([id number]) on 2022/02/12 8:53:47 PM  Radiology CT Head Wo Contrast  Result Date: 02/12/2022 CLINICAL DATA:  Arrest status post CPR EXAM: CT HEAD WITHOUT CONTRAST TECHNIQUE: Contiguous axial images were obtained from the base of the skull through the vertex without intravenous contrast. RADIATION DOSE REDUCTION: This exam was performed according to the departmental dose-optimization program which includes automated exposure control, adjustment of the mA and/or kV according to patient size and/or use of iterative reconstruction technique. COMPARISON:  12/27/2021 FINDINGS: Brain: Diffuse cerebral atrophy again noted. Chronic small vessel ischemic changes are seen throughout the periventricular and subcortical white matter. No evidence of acute infarct or hemorrhage. Lateral ventricles and midline structures are stable. No acute extra-axial fluid collections. No mass effect. Vascular: Stable atherosclerosis.  No hyperdense vessel. Skull: Near complete resolution of the right frontal scalp hematoma seen previously. No acute bony abnormalities. Sinuses/Orbits: Minimal fluid within the right posterior ethmoid air cells and right sphenoid sinus. Remaining paranasal sinuses are clear. Other: None. IMPRESSION: 1. No acute intracranial process. 2. Chronic small vessel ischemic changes and diffuse cerebral atrophy, stable. Electronically Signed   By: Sharlet Salina M.D.   On: Feb 12, 2022 20:29   DG Chest Portable 1 View  Result Date: 12-Feb-2022  CLINICAL DATA:  Post intubation EXAM: PORTABLE CHEST 1 VIEW  COMPARISON:  12/27/2021 FINDINGS: Patient is rotated. An endotracheal tube has been placed with tip measuring 4.8 cm above the carina. Enteric tube is present with tip off the field of view but below the left hemidiaphragm. Shallow inspiration. Heart size and pulmonary vascularity are likely normal for technique. Linear atelectasis or infiltration in both lung bases. No pleural effusions. No pneumothorax. Calcification in the right upper quadrant possibly adrenal or dystrophic. Displaced right rib fractures are present. IMPRESSION: Appliances appear in satisfactory position. Shallow inspiration with infiltration or atelectasis in the lung bases. Electronically Signed   By: Burman Nieves M.D.   On: 02-17-22 19:58    Procedures Procedure Name: Intubation Date/Time: 01/28/2022 8:18 PM Performed by: Milagros Loll, MD Pre-anesthesia Checklist: Patient identified, Patient being monitored, Emergency Drugs available, Timeout performed and Suction available Oxygen Delivery Method: Ambu bag Preoxygenation: Pre-oxygenation with 100% oxygen Induction Type: Rapid sequence Ventilation: Mask ventilation without difficulty Laryngoscope Size: Glidescope and 3 Grade View: Grade I Tube size: 7.5 mm Number of attempts: 1 Airway Equipment and Method: Stylet and Rigid stylet Placement Confirmation: ETT inserted through vocal cords under direct vision, CO2 detector and Breath sounds checked- equal and bilateral Secured at: 21 cm Comments: Large vomitus in oropharynx, suctioned, deflated king airway, removed king airway, visualized cords well, passed ETT through cords with out diffculty    .Critical Care Performed by: Milagros Loll, MD Authorized by: Milagros Loll, MD   Critical care provider statement:    Critical care time (minutes):  38   Critical care was time spent personally by me on the following activities:  Development of treatment plan with patient or surrogate, discussions with  consultants, evaluation of patient's response to treatment, examination of patient, ordering and review of laboratory studies, ordering and review of radiographic studies, ordering and performing treatments and interventions, pulse oximetry, re-evaluation of patient's condition and review of old charts    Medications Ordered in ED Medications - No data to display   ED Course/ Medical Decision Making/ A&P Clinical Course as of 01/28/22 2021  Wed Feb 17, 2022  2019 D/w crit care - they'll come admit [RD]    Clinical Course User Index [RD] Milagros Loll, MD                           Medical Decision Making Amount and/or Complexity of Data Reviewed Labs: ordered. Radiology: ordered.  Risk Decision regarding hospitalization.   86 year old lady presenting to the ER post CPR from nursing facility.  Apparently found unresponsive, uncertain downtime.  Received at least 40 minutes of CPR prior to arrival here.  King airway was in place and functioning.  This was exchanged with endotracheal tube.  Patient was hypotensive, given some push dose Neo-Synephrine followed by Levophed drip and fluid bolus.  On intubation there was noted to be a very large amount of vomitus in the oropharynx.  Based on the history concern for possibility of aspiration event.  CT head was obtained and I independently reviewed these images, no obvious bleed or stroke noted.  I independently reviewed CXR, ET tube in place.  Atelectasis versus infiltration in lung bases.  Started on Unasyn for possible aspiration pneumonia.  Basic labs reviewed, noted mild hyperkalemia, leukocytosis, profound lactic acidosis.  No significant trop elevation and no definite ischemic change on ekg to suggest ACS as primary cause of event today.  I discussed my concern for poor prognosis given current clinical exam and the history of prolonged CPR in an elderly lady from nursing facility with patient's daughter.  She came to bedside.  Would  like to continue current care at this time but seemed open to further discussions regarding goals of care.  Discussed the case with the critical care team.  They will come down and admit patient. Will also revisit goals of care conversation.         Final Clinical Impression(s) / ED Diagnoses Final diagnoses:  Cardiac arrest (HCC)  Leukocytosis, unspecified type  Lactic acidosis  Respiratory failure with hypoxia, unspecified chronicity (HCC)  Unresponsiveness    Rx / DC Orders ED Discharge Orders     None         Milagros Loll, MD February 23, 2022 2054    Milagros Loll, MD 01/28/22 2021

## 2022-02-13 NOTE — Progress Notes (Addendum)
eLink Physician-Brief Progress Note ?Patient Name: Adrienne [name] ?DOB: 1936-04-13 ?MRN: [id number] ? ? ?Date of Service ? 02-11-2022  ?HPI/Events of Note ? Patient with out of hospital cardiac arrest and prolonged resuscitation prior to ROSC, she is currently GCS 3, family wishes to terminally extubate her and transition to comfort measures. Per CCM NP note in the chart.  ?eICU Interventions ? I camera'd into the room and spoke with patients daughter  [name] who is patient's POA, I confirmed that she would like the patient to be extubated and transitioned to comfort measures. New Patient Evaluation.  ? ? ? ?  ? ?Migdalia Dk ?Feb 11, 2022, 10:55 PM ?

## 2022-02-13 NOTE — Progress Notes (Signed)
Pharmacy Antibiotic Note ? ?Adrienne [name] is a 86 y.o. female admitted on February 25, 2022 with  aspiration pneumonia .  Pharmacy has been consulted for Unasyn dosing. ? ?Patient presented after cardiac arrest. Patient remains afebrile. WBC is 13.7. Patient is on norepinephrine with lactic acid >9 status post arrest. Patients current CrCl based on todays CMP is 78 mL/min. This is likely not reflective of true renal function as patient is s/p cardiac arrest and remains hypotensive. Will empirically reduce dosing and continue to monitor renal function. ? ?Plan: ?Start Unasyn 3 grams every 12 hours ?Monitor for antibiotic plans surrounding comfort care ? ?Height: 5\' 2"  (157.5 cm) ?IBW/kg (Calculated) : 50.1 ? ?Temp (24hrs), Avg:97 ?F (36.1 ?C), Min:95.8 ?F (35.4 ?C), Max:98.3 ?F (36.8 ?C) ? ?Recent Labs  ?Lab 25-Feb-2022 ?1922 Feb 25, 2022 ?1931 February 25, 2022 ?1941  ?WBC 13.7*  --   --   ?CREATININE 0.82 0.60  --   ?LATICACIDVEN  --   --  >9.0*  ?  ?CrCl cannot be calculated (Unknown ideal weight.).   ? ?Allergies  ?Allergen Reactions  ? Prednisone Shortness Of Breath  ? Ibuprofen Other (See Comments)  ?  Swelling mouth and lips  ? Meclizine Other (See Comments)  ?  HALLUCINATIONS  ? Sulfonamide Derivatives   ?  REACTION: Reaction not known  ? ? ?Antimicrobials this admission: ?Unasyn 3/15 >>  ? ?Dose adjustments this admission: ?none ? ?Microbiology results: ?3/15 BCx: IP ?3/15 UCx: IP  ? ?Thank you for allowing pharmacy to participate in this patient's care. ? ?Reatha Harps, PharmD ?PGY1 Pharmacy Resident ?02/25/2022 9:13 PM ?Check [email or web address] for unit specific pharmacy number ? ? ?

## 2022-02-13 NOTE — Procedures (Signed)
Intubation Procedure Note ? ?Emili Mcloughlin Laspina  ?[id number]  ?03-20-1936 ? ?Date:2022/02/19  ?Time:10:10 PM  ? ?Provider Performing:Tiant Peixoto, Lyla Son  ? ? ?Procedure: Intubation (31500) ? ?Indication(s) ?Respiratory Failure ? ?Consent ?Unable to obtain consent due to emergent nature of procedure. ? ? ?Anesthesia ?  ? ? ?Time Out ?Verified patient identification, verified procedure, site/side was marked, verified correct patient position, special equipment/implants available, medications/allergies/relevant history reviewed, required imaging and test results available. ? ? ?Sterile Technique ?Usual hand hygeine, masks, and gloves were used ? ? ?Procedure Description ?Patient positioned in bed supine.  Sedation given as noted above.  Patient was intubated with endotracheal tube using Glidescope.  View was Grade 1 full glottis .  Number of attempts was 1.  Colorimetric CO2 detector was consistent with tracheal placement. ? ? ?Complications/Tolerance ?None; patient tolerated the procedure well. ?Chest X-ray is ordered to verify placement. ? ? ?EBL ?Minimal ? ? ?Specimen(s) ?None ? ?

## 2022-02-13 NOTE — Plan of Care (Signed)
?  Goals of Care Family Meeting ? ? ?Date carried out:: Feb 18, 2022 ? ?Location of the meeting: Bedside ? ?Member's involved: Physician, Family Member or next of kin, and Other: Physician Assistant ? ?Durable Power of Insurance risk surveyor: Pt's daughter  [name] ? ?Discussion: We discussed goals of care for [name] .  Prolonged cardiac arrest in shock and unresponsive  ? ?Code status: Full DNR ? ?Disposition: In-patient comfort care ? ? ?Time spent for the meeting: 20 minutes ? ?Adrienne [name] Adrienne [name] ?02/18/2022, 9:16 PM ? ?

## 2022-02-13 NOTE — H&P (Signed)
? ?NAME:  HANG ORTEGA, MRN:  [id number], DOB:  [birth year], LOS: 0 ?ADMISSION DATE:  February 20, 2022, CONSULTATION DATE:  2022-02-20 ?REFERRING MD:  Roslynn Amble, CHIEF COMPLAINT:  Cardiac Arrest  ? ?History of Present Illness:  ?Melloney Quintrell is a 86 y.o. F with PMH significant for HTN, DM, HL, obesity, resides at nursing home after knee replacements who was found unresponsive for unknown amount of time, but possibly 30 minutes prior to initiation of CPR.  No shockable rhythm, underwent 40 minutes of CPR before ROSC.  Food was seen in the airway on intubation.   She was unresponsive without any sedation.  After discussion with pt's daughter at the bedside code status was changed to DNR and plan to transition to comfort care once family arrives.  ? ?Pertinent  Medical History  ? has a past medical history of Anxiety, Depression, Diabetes mellitus, Hypercholesterolemia, Hypertension, Hypothyroidism, Normal nuclear stress test (Jan 2012), Obesity, Osteoarthritis (arthritis due to wear and tear of joints), Osteopenia, and Sleep apnea. ? ? ?Significant Hospital Events: ?Including procedures, antibiotic start and stop dates in addition to other pertinent events   ?3/15 PEA arrest, prolonged down time, DNR and comfort care ? ?Interim History / Subjective:  ?Transitioned to DNR and comfort care ? ?Objective   ?Blood pressure 114/81, pulse (!) 111, temperature (!) 97 ?F (36.1 ?C), resp. rate 18, height 5\' 2"  (1.575 m), SpO2 100 %. ?   ?Vent Mode: PRVC ?FiO2 (%):  [50 %-100 %] 50 % ?Set Rate:  [16 bmp-20 bmp] 20 bmp ?Vt Set:  [400 mL] 400 mL ?PEEP:  [5 cmH20] 5 cmH20 ?Plateau Pressure:  [19 cmH20-22 cmH20] 19 cmH20  ?No intake or output data in the 24 hours ending February 20, 2022 2058 ?There were no vitals filed for this visit. ? ?General:  Critically ill female intubated and sedated  ?HEENT: MM pink/moist, ETT in place, pupils fixed and responsive ?Neuro: examined off sedation, unresponsive, no corneal reflex, no gag, not triggering vent  with rate reduced, eyes open with peri-orbital myoclonus and occasional LE twitching, chewing on ETT ?CV: s1s2 rrr, no m/r/g ?PULM:  mechanical breath sounds bilaterally, no rhonchi or wheezing ?GI: massively distended and firm without bowel sounds ?Extremities: warm/dry, 2+ edema  ?Skin: skin excoriation on the abdomen ? ? ?Resolved Hospital Problem list   ? ? ?Assessment & Plan:  ? ? ?Out of hospital, unwitnessed PEA cardiac arrest ?Hypercapneic and Hypoxic Respiratory Failure ?Shock ?Baseline wheelchair bound and SNF resident with prolonged cardiac arrest and unresponsive in shock on Levophed, suspect this was secondary to aspiration event ?-intubated, poor prognosis, after discussion with family decision made to transition to comfort care ?-Admit to ICU and continue supportive care until family arrives ?-titrate Vent setting to maintain SpO2 greater than or equal to 90%. ?-HOB elevated 30 degrees. ? ? ?Best Practice (right click and "Reselect all SmartList Selections" daily)  ? ?Diet/type: NPO ?DVT prophylaxis: not indicated ?GI prophylaxis: N/A ?Lines: N/A ?Foley:  Yes, and it is still needed ?Code Status:  DNR ?Last date of multidisciplinary goals of care discussion [3/15 GOC discussion with Dr. Shearon Stalls, DNR and comfort care] ? ?Labs   ?CBC: ?Recent Labs  ?Lab 02-20-22 ?1922 02-20-22 ?1931 2022-02-20 ?2017  ?WBC 13.7*  --   --   ?NEUTROABS 8.9*  --   --   ?HGB 10.0* 11.6* 10.2*  ?HCT 35.0* 34.0* 30.0*  ?MCV 112.9*  --   --   ?PLT 199  --   --   ? ? ?Basic  Metabolic Panel: ?Recent Labs  ?Lab 02-11-22 ?1922 11-Feb-2022 ?1931 02-11-2022 ?2017  ?NA 134* 138 137  ?K 5.9* 5.4* 4.2  ?CL 102 102  --   ?CO2 18*  --   --   ?GLUCOSE 134* 128*  --   ?BUN 13 17  --   ?CREATININE 0.82 0.60  --   ?CALCIUM 7.9*  --   --   ?MG 2.0  --   --   ? ?GFR: ?CrCl cannot be calculated (Unknown ideal weight.). ?Recent Labs  ?Lab 2022-02-11 ?1922 Feb 11, 2022 ?1941  ?WBC 13.7*  --   ?LATICACIDVEN  --  >9.0*  ? ? ?Liver Function Tests: ?Recent Labs   ?Lab 02-11-2022 ?1922  ?AST 98*  ?ALT 24  ?ALKPHOS 71  ?BILITOT 1.1  ?PROT 5.4*  ?ALBUMIN 1.6*  ? ?No results for input(s): LIPASE, AMYLASE in the last 168 hours. ?No results for input(s): AMMONIA in the last 168 hours. ? ?ABG ?   ?Component Value Date/Time  ? PHART 7.052 (LL) 2022-02-11 2017  ? PCO2ART 69.1 ([place]) 02/11/2022 2017  ? PO2ART 423 (H) 2022/02/11 2017  ? HCO3 19.5 (L) 2022/02/11 2017  ? TCO2 22 11-Feb-2022 2017  ? ACIDBASEDEF 11.0 (H) 02/11/2022 2017  ? O2SAT 100 February 11, 2022 2017  ?  ? ?Coagulation Profile: ?No results for input(s): INR, PROTIME in the last 168 hours. ? ?Cardiac Enzymes: ?No results for input(s): CKTOTAL, CKMB, CKMBINDEX, TROPONINI in the last 168 hours. ? ?HbA1C: ?Hgb A1c MFr Bld  ?Date/Time Value Ref Range Status  ?08/09/2017 01:18 PM 6.9 (H) 4.8 - 5.6 % Final  ?  Comment:  ?  (NOTE) ?Pre diabetes:          5.7%-6.4% ?Diabetes:              >6.4% ?Glycemic control for   <7.0% ?adults with diabetes ?  ?11/02/2013 10:40 PM 6.1 (H) <5.7 % Final  ?  Comment:  ?  (NOTE) ?                                                                      ?According to the ADA Clinical Practice Recommendations for 2011, when ?HbA1c is used as a screening test: ? >=6.5%   Diagnostic of Diabetes Mellitus ?          (if abnormal result is confirmed) ?5.7-6.4%   Increased risk of developing Diabetes Mellitus ?References:Diagnosis and Classification of Diabetes Mellitus,Diabetes ?[id number] 1):S62-S69 and Standards of Medical Care in         ?Diabetes - 2011,Diabetes Care,2011,34 (Suppl 1):S11-S61.  ? ? ?CBG: ?No results for input(s): GLUCAP in the last 168 hours. ? ?Review of Systems:   ?Unable to obtain ? ?Past Medical History:  ?She,  has a past medical history of Anxiety, Depression, Diabetes mellitus, Hypercholesterolemia, Hypertension, Hypothyroidism, Normal nuclear stress test (Jan 2012), Obesity, Osteoarthritis (arthritis due to wear and tear of joints), Osteopenia, and Sleep apnea.  ? ?Surgical  History:  ? ?Past Surgical History:  ?Procedure Laterality Date  ? ABDOMINAL HYSTERECTOMY    ? BILATERAL OOPHORECTOMY    ? CARDIAC CATHETERIZATION  02/03/1999  ? EF 70%  ? CARDIOVASCULAR STRESS TEST  11/30/2010  ? EF 81%, NORMAL  ? LAPAROTOMY N/A 12/22/2016  ? Procedure:  EXPLORATORY LAPAROTOMY;  Surgeon: Erroll Luna, MD;  Location: [facility];  Service: General;  Laterality: N/A;  ? LYSIS OF ADHESION N/A 12/22/2016  ? Procedure: LYSIS OF ADHESION;  Surgeon: Erroll Luna, MD;  Location: [facility];  Service: General;  Laterality: N/A;  ? ORIF ANKLE FRACTURE  2004  ? THYROIDECTOMY, PARTIAL    ? TOTAL KNEE ARTHROPLASTY  04/21/2012  ? Procedure: TOTAL KNEE ARTHROPLASTY;  Surgeon: Gearlean Alf, MD;  Location: [facility];  Service: Orthopedics;  Laterality: Right;  ? TOTAL KNEE ARTHROPLASTY Left 01/12/2013  ? Procedure: TOTAL KNEE ARTHROPLASTY;  Surgeon: Gearlean Alf, MD;  Location: [facility];  Service: Orthopedics;  Laterality: Left;  ? UMBILICAL HERNIA REPAIR N/A 12/22/2016  ? Procedure: HERNIA REPAIR UMBILICAL ADULT;  Surgeon: Erroll Luna, MD;  Location: [place];  Service: General;  Laterality: N/A;  ? US ECHOCARDIOGRAPHY  08/21/2004  ? EF 60-65%  ? VESICOVAGINAL FISTULA CLOSURE W/ TAH    ?  ? ?Social History:  ? reports that she has never smoked. She has never used smokeless tobacco. She reports that she does not drink alcohol and does not use drugs.  ? ?Family History:  ?Her family history includes Breast cancer in her sister; Colon cancer in her mother; Heart attack in her father and mother; Heart disease in her father; Hypertension in her father and mother.  ? ?Allergies ?Allergies  ?Allergen Reactions  ? Prednisone Shortness Of Breath  ? Ibuprofen Other (See Comments)  ?  Swelling mouth and lips  ? Meclizine Other (See Comments)  ?  HALLUCINATIONS  ? Sulfonamide Derivatives   ?  REACTION: Reaction not known  ?  ? ?Home Medications  ?Prior to Admission medications   ?Medication Sig Start Date End Date Taking? Authorizing Provider   ?acetaminophen (TYLENOL) 325 MG tablet Take 650 mg by mouth every 6 (six) hours as needed for mild pain.    [provider]  ?ALPRAZolam Duanne Moron) 0.25 MG tablet Take 0.25 mg by mouth 2 (two) times dai

## 2022-02-13 NOTE — Progress Notes (Signed)
Patient transported on ventilator to 3M12 with no complications. Vitals stable.  ?

## 2022-02-13 DEATH — deceased
